# Patient Record
Sex: Male | Born: 1940
Health system: Southern US, Community
[De-identification: ages and names within clinical notes are randomized; demographics above are authoritative.]

## PROBLEM LIST (undated history)

## (undated) DIAGNOSIS — R569 Unspecified convulsions: Secondary | ICD-10-CM

## (undated) DIAGNOSIS — C801 Malignant (primary) neoplasm, unspecified: Secondary | ICD-10-CM

## (undated) DIAGNOSIS — E785 Hyperlipidemia, unspecified: Secondary | ICD-10-CM

## (undated) DIAGNOSIS — M1990 Unspecified osteoarthritis, unspecified site: Secondary | ICD-10-CM

## (undated) DIAGNOSIS — G610 Guillain-Barre syndrome: Secondary | ICD-10-CM

## (undated) DIAGNOSIS — I10 Essential (primary) hypertension: Secondary | ICD-10-CM

## (undated) DIAGNOSIS — K219 Gastro-esophageal reflux disease without esophagitis: Secondary | ICD-10-CM

## (undated) HISTORY — DX: Essential (primary) hypertension: I10

## (undated) HISTORY — DX: Unspecified osteoarthritis, unspecified site: M19.90

## (undated) HISTORY — DX: Hyperlipidemia, unspecified: E78.5

## (undated) HISTORY — DX: Guillain-Barre syndrome: G61.0

## (undated) HISTORY — PX: CATARACT EXTRACTION: SUR2

## (undated) HISTORY — DX: Malignant (primary) neoplasm, unspecified: C80.1

## (undated) HISTORY — DX: Gastro-esophageal reflux disease without esophagitis: K21.9

---
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
## 1988-02-26 HISTORY — PX: NOSE SURGERY: SHX723

## 1993-02-25 HISTORY — PX: HERNIA REPAIR: SHX51

## 1997-08-04 ENCOUNTER — Other Ambulatory Visit: Admission: RE | Admit: 1997-08-04 | Discharge: 1997-08-04 | Payer: Self-pay | Admitting: Urology

## 1998-02-25 DIAGNOSIS — G610 Guillain-Barre syndrome: Secondary | ICD-10-CM

## 1998-02-25 HISTORY — DX: Guillain-Barre syndrome: G61.0

## 1998-02-25 HISTORY — PX: PROSTATE SURGERY: SHX751

## 1998-02-26 ENCOUNTER — Emergency Department (HOSPITAL_COMMUNITY): Admission: EM | Admit: 1998-02-26 | Discharge: 1998-02-26 | Payer: Self-pay | Admitting: Emergency Medicine

## 1998-02-27 ENCOUNTER — Encounter: Payer: Self-pay | Admitting: Neurology

## 1998-02-28 ENCOUNTER — Inpatient Hospital Stay (HOSPITAL_COMMUNITY): Admission: EM | Admit: 1998-02-28 | Discharge: 1998-03-20 | Payer: Self-pay | Admitting: Emergency Medicine

## 1998-02-28 ENCOUNTER — Encounter: Payer: Self-pay | Admitting: Neurology

## 1998-03-03 ENCOUNTER — Encounter: Payer: Self-pay | Admitting: Neurology

## 1998-03-05 ENCOUNTER — Encounter: Payer: Self-pay | Admitting: Neurology

## 1998-03-06 ENCOUNTER — Encounter: Payer: Self-pay | Admitting: Critical Care Medicine

## 1998-03-20 ENCOUNTER — Inpatient Hospital Stay
Admission: RE | Admit: 1998-03-20 | Discharge: 1998-03-30 | Payer: Self-pay | Admitting: Physical Medicine and Rehabilitation

## 1998-03-23 ENCOUNTER — Encounter: Payer: Self-pay | Admitting: Physical Medicine and Rehabilitation

## 1998-03-30 ENCOUNTER — Inpatient Hospital Stay (HOSPITAL_COMMUNITY)
Admission: RE | Admit: 1998-03-30 | Discharge: 1998-04-18 | Payer: Self-pay | Admitting: Physical Medicine and Rehabilitation

## 1998-04-19 ENCOUNTER — Encounter
Admission: RE | Admit: 1998-04-19 | Discharge: 1998-07-18 | Payer: Self-pay | Admitting: Physical Medicine and Rehabilitation

## 1998-07-27 ENCOUNTER — Encounter: Admission: RE | Admit: 1998-07-27 | Discharge: 1998-08-21 | Payer: Self-pay

## 1998-11-08 ENCOUNTER — Other Ambulatory Visit: Admission: RE | Admit: 1998-11-08 | Discharge: 1998-11-08 | Payer: Self-pay | Admitting: Urology

## 1998-12-14 ENCOUNTER — Encounter: Admission: RE | Admit: 1998-12-14 | Discharge: 1999-03-14 | Payer: Self-pay | Admitting: Radiation Oncology

## 1999-02-13 ENCOUNTER — Encounter: Payer: Self-pay | Admitting: Urology

## 1999-02-13 ENCOUNTER — Ambulatory Visit (HOSPITAL_BASED_OUTPATIENT_CLINIC_OR_DEPARTMENT_OTHER): Admission: RE | Admit: 1999-02-13 | Discharge: 1999-02-13 | Payer: Self-pay | Admitting: Urology

## 1999-03-16 ENCOUNTER — Encounter: Admission: RE | Admit: 1999-03-16 | Discharge: 1999-06-14 | Payer: Self-pay | Admitting: Radiation Oncology

## 2000-05-13 ENCOUNTER — Encounter: Payer: Self-pay | Admitting: Neurology

## 2000-05-13 ENCOUNTER — Ambulatory Visit (HOSPITAL_COMMUNITY): Admission: RE | Admit: 2000-05-13 | Discharge: 2000-05-13 | Payer: Self-pay | Admitting: Neurology

## 2002-02-16 ENCOUNTER — Encounter (HOSPITAL_BASED_OUTPATIENT_CLINIC_OR_DEPARTMENT_OTHER): Payer: Self-pay | Admitting: General Surgery

## 2002-02-22 ENCOUNTER — Ambulatory Visit (HOSPITAL_COMMUNITY): Admission: RE | Admit: 2002-02-22 | Discharge: 2002-02-22 | Payer: Self-pay | Admitting: General Surgery

## 2002-12-20 ENCOUNTER — Encounter: Payer: Self-pay | Admitting: Internal Medicine

## 2002-12-20 ENCOUNTER — Encounter: Admission: RE | Admit: 2002-12-20 | Discharge: 2002-12-20 | Payer: Self-pay | Admitting: Internal Medicine

## 2004-01-25 ENCOUNTER — Ambulatory Visit: Payer: Self-pay | Admitting: Cardiology

## 2004-01-30 ENCOUNTER — Ambulatory Visit: Payer: Self-pay | Admitting: Internal Medicine

## 2004-02-06 ENCOUNTER — Ambulatory Visit: Payer: Self-pay | Admitting: Internal Medicine

## 2004-09-10 ENCOUNTER — Ambulatory Visit: Payer: Self-pay | Admitting: Internal Medicine

## 2006-02-25 HISTORY — PX: SHOULDER SURGERY: SHX246

## 2006-12-22 ENCOUNTER — Encounter: Payer: Self-pay | Admitting: Internal Medicine

## 2007-10-28 ENCOUNTER — Ambulatory Visit: Payer: Self-pay | Admitting: Internal Medicine

## 2007-10-28 DIAGNOSIS — R42 Dizziness and giddiness: Secondary | ICD-10-CM | POA: Insufficient documentation

## 2007-10-28 DIAGNOSIS — J45909 Unspecified asthma, uncomplicated: Secondary | ICD-10-CM | POA: Insufficient documentation

## 2007-10-28 DIAGNOSIS — R7309 Other abnormal glucose: Secondary | ICD-10-CM | POA: Insufficient documentation

## 2007-10-28 DIAGNOSIS — K219 Gastro-esophageal reflux disease without esophagitis: Secondary | ICD-10-CM | POA: Insufficient documentation

## 2007-10-28 DIAGNOSIS — Z8546 Personal history of malignant neoplasm of prostate: Secondary | ICD-10-CM | POA: Insufficient documentation

## 2007-10-28 DIAGNOSIS — R059 Cough, unspecified: Secondary | ICD-10-CM | POA: Insufficient documentation

## 2007-10-28 DIAGNOSIS — R05 Cough: Secondary | ICD-10-CM

## 2007-11-03 ENCOUNTER — Telehealth (INDEPENDENT_AMBULATORY_CARE_PROVIDER_SITE_OTHER): Payer: Self-pay | Admitting: *Deleted

## 2007-11-03 ENCOUNTER — Encounter (INDEPENDENT_AMBULATORY_CARE_PROVIDER_SITE_OTHER): Payer: Self-pay | Admitting: *Deleted

## 2007-11-06 ENCOUNTER — Ambulatory Visit: Payer: Self-pay | Admitting: Internal Medicine

## 2007-11-06 LAB — CONVERTED CEMR LAB
OCCULT 1: NEGATIVE
OCCULT 2: NEGATIVE
OCCULT 3: NEGATIVE

## 2007-11-09 ENCOUNTER — Encounter (INDEPENDENT_AMBULATORY_CARE_PROVIDER_SITE_OTHER): Payer: Self-pay | Admitting: *Deleted

## 2007-12-07 ENCOUNTER — Telehealth (INDEPENDENT_AMBULATORY_CARE_PROVIDER_SITE_OTHER): Payer: Self-pay | Admitting: *Deleted

## 2008-01-05 ENCOUNTER — Telehealth (INDEPENDENT_AMBULATORY_CARE_PROVIDER_SITE_OTHER): Payer: Self-pay | Admitting: *Deleted

## 2008-01-05 DIAGNOSIS — F528 Other sexual dysfunction not due to a substance or known physiological condition: Secondary | ICD-10-CM | POA: Insufficient documentation

## 2008-03-02 ENCOUNTER — Telehealth (INDEPENDENT_AMBULATORY_CARE_PROVIDER_SITE_OTHER): Payer: Self-pay | Admitting: *Deleted

## 2008-06-03 ENCOUNTER — Telehealth (INDEPENDENT_AMBULATORY_CARE_PROVIDER_SITE_OTHER): Payer: Self-pay | Admitting: *Deleted

## 2010-03-25 LAB — CONVERTED CEMR LAB
ALT: 14 units/L (ref 0–53)
AST: 19 units/L (ref 0–37)
Albumin: 3.7 g/dL (ref 3.5–5.2)
Alkaline Phosphatase: 58 units/L (ref 39–117)
BUN: 17 mg/dL (ref 6–23)
Basophils Absolute: 0 10*3/uL (ref 0.0–0.1)
Basophils Relative: 0.7 % (ref 0.0–3.0)
Bilirubin, Direct: 0.1 mg/dL (ref 0.0–0.3)
CO2: 31 meq/L (ref 19–32)
Calcium: 9.2 mg/dL (ref 8.4–10.5)
Chloride: 107 meq/L (ref 96–112)
Cholesterol: 169 mg/dL (ref 0–200)
Creatinine, Ser: 0.8 mg/dL (ref 0.4–1.5)
Eosinophils Absolute: 0.1 10*3/uL (ref 0.0–0.7)
Eosinophils Relative: 2.1 % (ref 0.0–5.0)
GFR calc Af Amer: 124 mL/min
GFR calc non Af Amer: 102 mL/min
Glucose, Bld: 98 mg/dL (ref 70–99)
HCT: 45.2 % (ref 39.0–52.0)
HDL: 34.7 mg/dL — ABNORMAL LOW (ref >39.0)
Hemoglobin: 15.6 g/dL (ref 13.0–17.0)
Hgb A1c MFr Bld: 5.4 % (ref 4.6–6.0)
LDL Cholesterol: 122 mg/dL — ABNORMAL HIGH (ref 0–99)
Lymphocytes Relative: 23 % (ref 12.0–46.0)
MCHC: 34.6 g/dL (ref 30.0–36.0)
MCV: 96.7 fL (ref 78.0–100.0)
Monocytes Absolute: 0.7 10*3/uL (ref 0.1–1.0)
Monocytes Relative: 11.5 % (ref 3.0–12.0)
Neutro Abs: 3.8 10*3/uL (ref 1.4–7.7)
Neutrophils Relative %: 62.7 % (ref 43.0–77.0)
Platelets: 217 10*3/uL (ref 150–400)
Potassium: 4.6 meq/L (ref 3.5–5.1)
RBC: 4.67 M/uL (ref 4.22–5.81)
RDW: 12.5 % (ref 11.5–14.6)
Sodium: 140 meq/L (ref 135–145)
TSH: 2.94 microintl units/mL (ref 0.35–5.50)
Total Bilirubin: 0.9 mg/dL (ref 0.3–1.2)
Total CHOL/HDL Ratio: 4.9
Total Protein: 6.9 g/dL (ref 6.0–8.3)
Triglycerides: 64 mg/dL (ref 0–149)
VLDL: 13 mg/dL (ref 0–40)
WBC: 6 10*3/uL (ref 4.5–10.5)

## 2010-07-13 NOTE — Op Note (Signed)
NAME:  Drew Smith, Drew Smith                      ACCOUNT NO.:  [id number]   MEDICAL RECORD NO.:  [id number]                   PATIENT TYPE:  OIB   LOCATION:  NA                                   FACILITY:  [facility]   PHYSICIAN:  Leonie Man, M.D.                DATE OF BIRTH:  [birth year]   DATE OF PROCEDURE:  02/22/2002  DATE OF DISCHARGE:                                 OPERATIVE REPORT   PREOPERATIVE DIAGNOSIS:  Right inguinal hernia.   POSTOPERATIVE DIAGNOSIS:  Right direct inguinal hernia.   OPERATION PERFORMED:  Repair of right direct inguinal hernia with mesh.   SURGEON:  Leonie Man, M.D.   ASSISTANT:  Magnus Ivan, RN   ANESTHESIA:  General.   INDICATIONS FOR PROCEDURE:  The patient is a 70 year old man presenting with  a right-sided groin mass which has been easily reducible but increasing in  size causing a modest amount of discomfort.  He comes to the operating room  now after the risks and benefits of surgery had been fully discussed and all  questions answered and consent obtained for right inguinal hernia.   DESCRIPTION OF PROCEDURE:  Following the induction of satisfactory general  anesthesia, the patient was positioned supinely and the abdomen was prepped  and draped to be included in the sterile operative field.  A transverse  incision was made in the lower abdominal crease, deepened through the skin  and subcutaneous tissues carrying dissection down to the external oblique  aponeurosis.  The external inguinal ring was opened and the cord was  elevated and held with the Penrose drain.  A direct hernia was dissected  free from the cord and reduced into the retroperitoneum.  Dissecting along  the anterior medial aspects of the cord, there was no indirect hernia noted.  The floor of the inguinal canal was then repaired with a patch of  polypropylene mesh sewn in at the pubic tubercle and carried up along the  conjoined tendon to the internal ring and again  from the pubic tubercle up  along the shelving edge of Poupart's ligament to the internal ring.  The  mesh having been split so as to allow normal emanation of the cord.  The  tails of the mesh were then sutured down behind the cord to completely  reform a new internal ring.  This was sutured down with 2-0 Novofil sutures.  Sponge, instrument and sharp counts were then verified.  The external  oblique aponeurosis was closed over the cord with a running 2-0 Vicryl  suture.  Scarpa's fascia was closed with a running 3-0 Vicryl suture and the  skin was closed with running 4-0 Monocryl and then reinforced with Steri-  Strips.  Sterile dressings were applied.  Anesthetic was reversed and the  patient removed from the operating room to the recovery room in stable  condition, having tolerated the procedure well.  Leonie Man, M.D.    PB/MEDQ  D:  02/22/2002  T:  02/22/2002  Job:  [id number]

## 2010-11-28 ENCOUNTER — Encounter: Payer: Self-pay | Admitting: Family Medicine

## 2010-11-28 ENCOUNTER — Ambulatory Visit (INDEPENDENT_AMBULATORY_CARE_PROVIDER_SITE_OTHER): Payer: Medicare Other | Admitting: Family Medicine

## 2010-11-28 DIAGNOSIS — I10 Essential (primary) hypertension: Secondary | ICD-10-CM

## 2010-11-28 DIAGNOSIS — E785 Hyperlipidemia, unspecified: Secondary | ICD-10-CM

## 2010-11-28 DIAGNOSIS — Z8546 Personal history of malignant neoplasm of prostate: Secondary | ICD-10-CM

## 2010-11-28 DIAGNOSIS — Z23 Encounter for immunization: Secondary | ICD-10-CM

## 2010-11-28 DIAGNOSIS — B001 Herpesviral vesicular dermatitis: Secondary | ICD-10-CM

## 2010-11-28 DIAGNOSIS — B009 Herpesviral infection, unspecified: Secondary | ICD-10-CM

## 2010-11-28 DIAGNOSIS — K219 Gastro-esophageal reflux disease without esophagitis: Secondary | ICD-10-CM

## 2010-11-28 LAB — HEPATIC FUNCTION PANEL
Albumin: 3.8 g/dL (ref 3.5–5.2)
Alkaline Phosphatase: 62 U/L (ref 39–117)
Total Protein: 6.6 g/dL (ref 6.0–8.3)

## 2010-11-28 LAB — LIPID PANEL
Cholesterol: 159 mg/dL (ref 0–200)
HDL: 40.3 mg/dL (ref >39.00)
LDL Cholesterol: 103 mg/dL — ABNORMAL HIGH (ref 0–99)
Triglycerides: 81 mg/dL (ref 0.0–149.0)
VLDL: 16.2 mg/dL (ref 0.0–40.0)

## 2010-11-28 LAB — BASIC METABOLIC PANEL
Chloride: 105 mEq/L (ref 96–112)
Creatinine, Ser: 0.8 mg/dL (ref 0.4–1.5)
Potassium: 4.3 mEq/L (ref 3.5–5.1)
Sodium: 140 mEq/L (ref 135–145)

## 2010-11-28 MED ORDER — DESONIDE 0.05 % EX CREA: TOPICAL_CREAM | CUTANEOUS | Status: DC | PRN

## 2010-11-28 MED ORDER — AMLODIPINE BESYLATE 5 MG PO TABS: 5.0000 mg | ORAL_TABLET | Freq: Every day | ORAL | Status: DC

## 2010-11-28 MED ORDER — ACYCLOVIR 400 MG PO TABS: 400.0000 mg | ORAL_TABLET | ORAL | Status: DC | PRN

## 2010-11-28 NOTE — Progress Notes (Signed)
  Subjective:    Patient ID: Drew Smith, male    DOB: April 22, 1940, 70 y.o.   MRN: [id number]  HPI New patient to establish care. He has history of GERD, asthma, prostate cancer, hypertension, mild dyslipidemia. Prior history of Guillain Barre Syndrome.  Thus, cannot take flu vaccines. Osteoarthritis and takes Celebrex occasionally. He has history of recurrent cold sores and takes acyclovir. Hypertension treated with Norvasc 5 mg daily. Blood pressure well-controlled. No orthostasis.  Continues to see urologist yearly regarding his history of prostate cancer.  Treated with radiation therapy.  Getting PSAs through their office. Requesting lab work today. No history of colon cancer screening other than Hemoccults. He refuses colonoscopy. No history of Pneumovax.  Family history significant for brother with type 2 diabetes and stroke. Father had history of alcoholism.  Patient is divorced. He is retired from Air[employer]. Nonsmoker. Occasional alcohol use. Former Publishing rights manager.   Review of Systems  Constitutional: Negative for fever, chills, fatigue and unexpected weight change.  HENT: Negative for trouble swallowing.   Respiratory: Negative for cough, shortness of breath and wheezing.   Gastrointestinal: Negative for abdominal pain.  Genitourinary: Negative for dysuria and hematuria.  Musculoskeletal: Negative for back pain.  Skin: Negative for rash.  Neurological: Negative for dizziness and headaches.  Hematological: Negative for adenopathy.  Psychiatric/Behavioral: Negative for dysphoric mood.       Objective:   Physical Exam  Constitutional: He is oriented to person, place, and time. He appears well-developed and well-nourished.  HENT:  Right Ear: External ear normal.  Left Ear: External ear normal.  Mouth/Throat: Oropharynx is clear and moist.  Neck: Neck supple. No thyromegaly present.  Cardiovascular: Normal rate, regular rhythm and normal heart sounds.   No murmur  heard. Pulmonary/Chest: Effort normal and breath sounds normal. No respiratory distress. He has no wheezes. He has no rales.  Abdominal: Soft. He exhibits no distension and no mass. There is no tenderness. There is no rebound and no guarding.  Musculoskeletal: He exhibits no edema.  Lymphadenopathy:    He has no cervical adenopathy.  Neurological: He is alert and oriented to person, place, and time. No cranial nerve deficit.  Skin: No rash noted.  Psychiatric: He has a normal mood and affect. His behavior is normal.          Assessment & Plan:  #1 dyslipidemia. Recheck lipid and hepatic panel #2 hypertension stable continue current medication. Refilled. Check basic metabolic panel #3 GERD stable continue current medication  #4 history prostate cancer #5 history of Guillain-Barr syndrome. No flu vaccine. #6 history of recurrent cold sores. Refill acyclovir

## 2010-11-29 NOTE — Progress Notes (Signed)
Quick Note:  Pt informed on home VM ______ 

## 2010-12-19 ENCOUNTER — Other Ambulatory Visit (INDEPENDENT_AMBULATORY_CARE_PROVIDER_SITE_OTHER): Payer: Medicare Other | Admitting: Family Medicine

## 2010-12-19 DIAGNOSIS — K921 Melena: Secondary | ICD-10-CM

## 2010-12-19 DIAGNOSIS — IMO0001 Reserved for inherently not codable concepts without codable children: Secondary | ICD-10-CM

## 2010-12-19 LAB — HEMOCCULT GUIAC POC 1CARD (OFFICE): Card #3 Fecal Occult Blood, POC: NEGATIVE

## 2011-03-07 ENCOUNTER — Encounter: Payer: Self-pay | Admitting: Family Medicine

## 2011-03-07 ENCOUNTER — Ambulatory Visit (INDEPENDENT_AMBULATORY_CARE_PROVIDER_SITE_OTHER): Payer: Medicare Other | Admitting: Family Medicine

## 2011-03-07 VITALS — BP 150/82 | Temp 98.3°F | Wt 176.0 lb

## 2011-03-07 DIAGNOSIS — R200 Anesthesia of skin: Secondary | ICD-10-CM

## 2011-03-07 DIAGNOSIS — R209 Unspecified disturbances of skin sensation: Secondary | ICD-10-CM

## 2011-03-07 DIAGNOSIS — Z8669 Personal history of other diseases of the nervous system and sense organs: Secondary | ICD-10-CM

## 2011-03-07 DIAGNOSIS — L820 Inflamed seborrheic keratosis: Secondary | ICD-10-CM

## 2011-03-07 NOTE — Patient Instructions (Signed)
Follow up promptly for any lower extremity weakness or any progressive numbness.

## 2011-03-07 NOTE — Progress Notes (Signed)
  Subjective:    Patient ID: Drew Smith, male    DOB: [birth year], 71 y.o.   MRN: [id number]  HPI  Here for separate issues as follows  Scaly brown lesion anterior nose. No history of skin cancer. Requesting treatment if possible. Previously prescribed Efudex for presumably actinic keratoses but no scaly lesions other than nasal lesion this time.  Intermittent numbness left foot. Occurs mostly with sitting. No weakness. Denies low back pain. No urine incontinence. Remote history of Guillain-Barr syndrome.   Review of Systems  Respiratory: Negative for cough and shortness of breath.   Cardiovascular: Negative for chest pain.  Gastrointestinal: Negative for abdominal pain.  Musculoskeletal: Negative for back pain.  Neurological: Positive for numbness. Negative for weakness.  Hematological: Negative for adenopathy. Does not bruise/bleed easily.       Objective:   Physical Exam  Constitutional: He is oriented to person, place, and time. He appears well-developed and well-nourished.  HENT:  Mouth/Throat: Oropharynx is clear and moist.  Neck: Neck supple. No thyromegaly present.  Cardiovascular: Normal rate and regular rhythm.   Pulmonary/Chest: Effort normal and breath sounds normal. No respiratory distress. He has no wheezes. He has no rales.  Musculoskeletal: He exhibits no edema.        good distal pulses. Good capillary refill  Lymphadenopathy:    He has no cervical adenopathy.  Neurological: He is alert and oriented to person, place, and time. He has normal reflexes.       Full-strength lower extremities.  Normal sensation at this time.  Skin:       Multiple seborrheic keratoses neck and head region.  Mid anterior nose reveals slightly raised, scaly, well demarcated, symmetric, brown lesion.          Assessment & Plan:  #1 seborrheic keratosis nose. Discussed risks and benefits of treatment with liquid nitrogen and patient wished to proceed. He was treated without  difficulty. No other concerning lesions noted #2 intermittent numbness left foot. Normal exam. No evidence for associated weakness. Probably positional. Touch base if numbness progresses or if he has any weakness or other new symptoms.

## 2011-04-15 ENCOUNTER — Telehealth: Payer: Self-pay | Admitting: *Deleted

## 2011-04-15 NOTE — Telephone Encounter (Signed)
Sent Fax to call us back with more information.

## 2011-04-15 NOTE — Telephone Encounter (Signed)
Pt does not answer phone.  Message left to call back.

## 2011-04-15 NOTE — Telephone Encounter (Signed)
Call pt and get more details

## 2011-04-15 NOTE — Telephone Encounter (Signed)
Pt has numbness of foot and history of Guliians Barre', so I would have to assume this is the problem.  ??

## 2011-04-15 NOTE — Telephone Encounter (Signed)
Needs order faxed for pt to have PT to evaluate and treat at appt at 3:30pm.

## 2011-04-15 NOTE — Telephone Encounter (Signed)
I do not see anything in the chart indication for physical therapy and don't know who was calling from where to ask questions?  Am I missing something? Fax order to [phone]

## 2011-04-16 ENCOUNTER — Telehealth: Payer: Self-pay | Admitting: *Deleted

## 2011-04-16 NOTE — Telephone Encounter (Signed)
SEE PREVIOUS PHONE NOTE, PLEASE.

## 2011-04-16 NOTE — Telephone Encounter (Signed)
Ibuprofen 800 one po q 8 hours prn and take with food.   Disp #60 with one refill.  Would use sparingly secondary to risk GI issues.

## 2011-04-16 NOTE — Telephone Encounter (Signed)
Pt requesting Ibuprofen 800 mg for back pain.  He has had it before with his previous doctor and likes to have it for long trips, which he has coming up [employer]

## 2011-04-17 MED ORDER — IBUPROFEN 600 MG PO TABS: 800.0000 mg | ORAL_TABLET | Freq: Three times a day (TID) | ORAL | Status: DC | PRN

## 2011-04-17 MED ORDER — IBUPROFEN 600 MG PO TABS: ORAL_TABLET | ORAL | Status: DC

## 2011-04-17 NOTE — Telephone Encounter (Signed)
Notified pt. 

## 2011-04-17 NOTE — Telephone Encounter (Signed)
Never received anything back from [facility].

## 2011-04-19 ENCOUNTER — Other Ambulatory Visit: Payer: Self-pay | Admitting: *Deleted

## 2011-04-19 MED ORDER — OMEPRAZOLE 20 MG PO CPDR: 20.0000 mg | DELAYED_RELEASE_CAPSULE | Freq: Every day | ORAL | Status: DC

## 2011-04-23 ENCOUNTER — Ambulatory Visit: Payer: Self-pay

## 2011-04-24 ENCOUNTER — Ambulatory Visit: Payer: Self-pay | Attending: Family Medicine

## 2011-07-29 ENCOUNTER — Encounter: Payer: Self-pay | Admitting: Family Medicine

## 2011-07-29 ENCOUNTER — Ambulatory Visit (INDEPENDENT_AMBULATORY_CARE_PROVIDER_SITE_OTHER): Payer: Medicare Other | Admitting: Family Medicine

## 2011-07-29 VITALS — BP 142/78 | Temp 98.0°F | Wt 175.0 lb

## 2011-07-29 DIAGNOSIS — L820 Inflamed seborrheic keratosis: Secondary | ICD-10-CM

## 2011-07-29 DIAGNOSIS — I10 Essential (primary) hypertension: Secondary | ICD-10-CM

## 2011-07-29 NOTE — Patient Instructions (Signed)
Seborrheic Keratosis  Seborrheic keratosis is a common, noncancerous (benign) skin growth that can occur anywhere on the skin. It looks like "stuck-on," waxy, rough, tan, brown, or black spots on the skin. These skin growths can be flat or raised. They are often called "barnacles" because of their pasted-on appearance. Usually, these skin growths appear in adulthood, around age 72, and increase in number as you age. They may also develop during pregnancy or following estrogen therapy. Many people may only have one growth appear in their lifetime, while some people may develop many growths.  CAUSES  It is unknown what causes these skin growths, but they appear to run in families.  SYMPTOMS  Seborrheic keratosis is often located on the face, chest, shoulders, back, or other areas. These growths are:   Usually painless, but may become irritated and itchy.   Yellow, brown, black, or other colors.   Slightly raised or have a flat surface.   Sometimes rough or wart-like in texture.   Often waxy on the surface.   Round or oval-shaped.   Sometimes "stuck-on" in appearance.   Sometimes single, but there are usually many growths.  Any growth that bleeds, itches on a regular basis, becomes inflamed, or becomes irritated needs to be evaluated by a skin specialist (dermatologist).  DIAGNOSIS  Diagnosis is mainly based on the way the growths appear. In some cases, it can be difficult to tell this type of skin growth from skin cancer. A skin growth tissue sample (biopsy)  may be used to confirm the diagnosis.  TREATMENT  Most often, treatment is not needed because the skin growths are benign. If the skin growth is irritated easily by clothing or jewelry, causing it to scab or bleed, treatment may be recommended. Patients may also choose to have the growths removed because they do not like their appearance. Most commonly, these growths are treated with cryosurgery.   In cryosurgery, liquid nitrogen is applied to "freeze" the growth. The growth usually falls off within a matter of days. A blister may form and dry into a scab that will also fall off. After the growth or scab falls off, it may leave a dark or light spot on the skin. This color may fade over time, or it may remain permanent on the skin.  HOME CARE INSTRUCTIONS  If the skin growths are treated with cryosurgery, the treated area needs to be kept clean with water and soap.  SEEK MEDICAL CARE IF:   You have questions about these growths or other skin problems.   You develop new symptoms, including:   A change in the appearance of the skin growth.   New growths.   Any bleeding, itching, or pain in the growths.   A skin growth that looks similar to seborrheic keratosis.  Document Released: 03/16/2010 Document Revised: 01/31/2011 Document Reviewed: 03/16/2010  [employer] Patient Information 2012 [employer].

## 2011-07-29 NOTE — Progress Notes (Signed)
  Subjective:    Patient ID: Drew Smith, male    DOB: [birth year], 71 y.o.   MRN: [id number]  HPI  Patient seen with the following issues  He's noticed some dark-colored and" bumps" anterior chest and right posterior neck. Sometimes irritated with jewelry. Occasional itching. No bleeding. No personal history of skin cancer. No recent rapid growth.  Occasional dizziness with lightheadedness but no syncope while playing golf. Generally tries to drink plenty of fluids. Some of this is position related. Takes amlodipine 5 mg daily for blood pressure. No chest pains. Blood pressure consistently 130/70 by home readings.   Review of Systems  Constitutional: Negative for fatigue.  Eyes: Negative for visual disturbance.  Respiratory: Negative for cough, chest tightness and shortness of breath.   Cardiovascular: Negative for chest pain, palpitations and leg swelling.  Neurological: Negative for dizziness, syncope, weakness, light-headedness and headaches.       Objective:   Physical Exam  Constitutional: He appears well-developed and well-nourished.  Cardiovascular: Normal rate and regular rhythm.   Pulmonary/Chest: Effort normal and breath sounds normal. No respiratory distress. He has no wheezes. He has no rales.  Musculoskeletal: He exhibits no edema.  Skin:       Patient has some scattered slightly raised well-demarcated brownish lesions consistent with seborrheic keratoses including posterior to left ear and anterior upper chest wall. Similar lesion right posterior neck. No atypical pigmentary change.          Assessment & Plan:  #1 irritated seborrheic keratoses. Patient requesting treatment. Discussed risk and benefits of treatment with liquid nitrogen and patient consented. Tolerated without difficulty.  Lesions treated included posterior to the left ear, right posterior neck, and 3 lesions upper anterior chest. #2 hypertension. Initial elevation today but repeat reading  improved. Continue close monitoring.

## 2011-08-02 ENCOUNTER — Other Ambulatory Visit: Payer: Self-pay | Admitting: *Deleted

## 2011-08-02 MED ORDER — TADALAFIL 5 MG PO TABS: 5.0000 mg | ORAL_TABLET | Freq: Every day | ORAL | Status: DC

## 2011-08-02 NOTE — Telephone Encounter (Signed)
Pt requesting trial new med Cialis 5 mg daily.  Per dr Caryl Never, [place] Cialis 5 mg daily with 5 refills.

## 2011-11-19 ENCOUNTER — Other Ambulatory Visit: Payer: Self-pay | Admitting: Family Medicine

## 2011-11-19 ENCOUNTER — Telehealth: Payer: Self-pay | Admitting: Family Medicine

## 2011-11-19 MED ORDER — OMEPRAZOLE 20 MG PO CPDR: 20.0000 mg | DELAYED_RELEASE_CAPSULE | Freq: Two times a day (BID) | ORAL | Status: DC

## 2011-11-19 NOTE — Telephone Encounter (Signed)
Patient called stating that his omeprazole should be twice per day and this is costing him more money getting the once per day and running out. Please advise and assist. Pharmacy is [facility]

## 2011-11-19 NOTE — Addendum Note (Signed)
Addended by: Melchor Amour on: 11/19/2011 12:27 PM   Modules accepted: Orders

## 2011-11-19 NOTE — Telephone Encounter (Signed)
Our records were once daily, changed to twice daily and sent to pharmacy

## 2011-12-04 ENCOUNTER — Other Ambulatory Visit: Payer: Self-pay | Admitting: *Deleted

## 2011-12-04 MED ORDER — DESONIDE 0.05 % EX CREA: TOPICAL_CREAM | CUTANEOUS | Status: DC | PRN

## 2012-01-27 ENCOUNTER — Encounter: Payer: Self-pay | Admitting: Family Medicine

## 2012-01-27 ENCOUNTER — Ambulatory Visit (INDEPENDENT_AMBULATORY_CARE_PROVIDER_SITE_OTHER): Payer: Medicare Other | Admitting: Family Medicine

## 2012-01-27 VITALS — BP 152/80 | Temp 97.8°F | Wt 174.0 lb

## 2012-01-27 DIAGNOSIS — K0520 Aggressive periodontitis, unspecified: Secondary | ICD-10-CM

## 2012-01-27 DIAGNOSIS — K05219 Aggressive periodontitis, localized, unspecified severity: Secondary | ICD-10-CM

## 2012-01-27 DIAGNOSIS — L509 Urticaria, unspecified: Secondary | ICD-10-CM

## 2012-01-27 DIAGNOSIS — I10 Essential (primary) hypertension: Secondary | ICD-10-CM

## 2012-01-27 MED ORDER — HYDROCHLOROTHIAZIDE 12.5 MG PO CAPS: 12.5000 mg | ORAL_CAPSULE | Freq: Every day | ORAL | Status: DC

## 2012-01-27 NOTE — Progress Notes (Signed)
  Subjective:    Patient ID: Drew Smith, male    DOB: 02/23/1941, 71 y.o.   MRN: [id number]  HPI  Patient seen for several issues as follows  History of hypertension. Takes amlodipine regularly 5 mg daily. Recently had some elevated blood pressure readings from 150 systolic. No headaches or dizziness. No recent chest pains.  Has noted frequent urticaria over the past month. These occur mostly on the upper extremities and trunk and are pressure induced. Has not tried any medications. He denies any history of food allergies.  He's had some recent some problems with right upper gum pain. He saw a dentist last week and was placed on amoxicillin for presumed dental abscess.. No recent fever or chills. He's also complains of sinus congestion and mild sore throat past few days. No purulent drainage.  Past Medical History  Diagnosis Date  . Arthritis   . Asthma   . Cancer     prostate  . GERD (gastroesophageal reflux disease)   . Hyperlipidemia   . Hypertension   . Guillain-Barre syndrome 2000   Past Surgical History  Procedure Date  . Eye surgery 3077    71 years old  . Nose surgery 1990  . Hernia repair 1995  . Shoulder surgery 2008  . Prostate surgery 2000    seed inplants    reports that he has never smoked. He does not have any smokeless tobacco history on file. His alcohol and drug histories not on file. family history includes Alcohol abuse in his father; Diabetes in his brother; Hyperlipidemia in his brother; and Stroke in his brother. No Known Allergies    Review of Systems  Constitutional: Negative for fever and chills.  Respiratory: Negative for cough and shortness of breath.   Cardiovascular: Negative for chest pain, palpitations and leg swelling.  Gastrointestinal: Negative for abdominal pain.  Skin: Positive for rash.  Neurological: Negative for dizziness and headaches.       Objective:   Physical Exam  Constitutional: He appears well-developed and  well-nourished.  HENT:  Right Ear: External ear normal.  Left Ear: External ear normal.  Mouth/Throat: Oropharynx is clear and moist.       Patient has significant dental decay. especially upper molars. He has some nonspecific erythema of the right upper gum. No evidence for any purulent drainage. No fluctuance.  Neck: Neck supple. No thyromegaly present.  Cardiovascular: Normal rate and regular rhythm.   Pulmonary/Chest: Effort normal and breath sounds normal. No respiratory distress. He has no wheezes. He has no rales.  Musculoskeletal: He exhibits no edema.  Lymphadenopathy:    He has no cervical adenopathy.  Skin:       Patient has only one isolated urticarial type lesion right lower abdomen.          Assessment & Plan:  #1 hypertension. Poorly controlled. Add HCTZ 12.5 mg daily. He has isolated systolic hypertension so we'll add this in addition to amlodipine. Reassess one month. Recommend high potassium diet #2 pressure induced urticaria. He'll try over-the-counter Allegra or Zyrtec for suppression since these are frequent  #3 probable recent right upper gum abscess. Finish out amoxicillin and keep followup with the dentist as scheduled.

## 2012-01-27 NOTE — Patient Instructions (Signed)
Try Allegra or Zyrtec for urticaria.

## 2012-01-28 ENCOUNTER — Other Ambulatory Visit: Payer: Self-pay | Admitting: *Deleted

## 2012-01-28 MED ORDER — HYDROCHLOROTHIAZIDE 12.5 MG PO CAPS: 12.5000 mg | ORAL_CAPSULE | Freq: Every day | ORAL | Status: DC

## 2012-02-21 ENCOUNTER — Ambulatory Visit (INDEPENDENT_AMBULATORY_CARE_PROVIDER_SITE_OTHER): Payer: Medicare Other | Admitting: Family Medicine

## 2012-02-21 ENCOUNTER — Other Ambulatory Visit: Payer: Self-pay | Admitting: *Deleted

## 2012-02-21 VITALS — BP 130/70 | HR 76 | Temp 98.0°F | Resp 16 | Ht 66.75 in | Wt 174.0 lb

## 2012-02-21 DIAGNOSIS — R609 Edema, unspecified: Secondary | ICD-10-CM

## 2012-02-21 DIAGNOSIS — L909 Atrophic disorder of skin, unspecified: Secondary | ICD-10-CM

## 2012-02-21 DIAGNOSIS — L918 Other hypertrophic disorders of the skin: Secondary | ICD-10-CM

## 2012-02-21 DIAGNOSIS — L919 Hypertrophic disorder of the skin, unspecified: Secondary | ICD-10-CM

## 2012-02-21 DIAGNOSIS — I10 Essential (primary) hypertension: Secondary | ICD-10-CM

## 2012-02-21 DIAGNOSIS — L509 Urticaria, unspecified: Secondary | ICD-10-CM

## 2012-02-21 DIAGNOSIS — F418 Other specified anxiety disorders: Secondary | ICD-10-CM

## 2012-02-21 DIAGNOSIS — F411 Generalized anxiety disorder: Secondary | ICD-10-CM

## 2012-02-21 DIAGNOSIS — R600 Localized edema: Secondary | ICD-10-CM

## 2012-02-21 MED ORDER — LORAZEPAM 0.5 MG PO TABS: ORAL_TABLET | ORAL | Status: DC

## 2012-02-21 NOTE — Patient Instructions (Addendum)
Monitor blood pressure and be in touch if consistently > 140/90 Continue with Zyrtec once daily for hives.

## 2012-02-21 NOTE — Progress Notes (Signed)
  Subjective:    Patient ID: Drew Smith, male    DOB: 06/17/1940, 71 y.o.   MRN: [id number]  HPI Patient seen for several issues  Recent poorly controlled blood pressure and added HCTZ 12.5 mg a day to amlodipine. He broke out some rash but is not sure if he had some rash before adding HCTZ. He describes what sounds like urticarial lesions on his trunk. These are starting to fade. He is using Zyrtec. Reposts some intermittent swelling of the feet. Has been on amlodipine for hypertension but stopped all medications about 3 days ago. His blood pressures have been stable since then. Still some itching involving the trunk.  Patient has a couple of skin tags left neck region which are irritating because of difficulty shaving. Requesting treatment today.  Upcoming dental surgery. Very anxious. Requesting premedication for anxiety.  Past Medical History  Diagnosis Date  . Arthritis   . Asthma   . Cancer     prostate  . GERD (gastroesophageal reflux disease)   . Hyperlipidemia   . Hypertension   . Guillain-Barre syndrome 2000   Past Surgical History  Procedure Date  . Eye surgery 6638    71 years old  . Nose surgery 1990  . Hernia repair 1995  . Shoulder surgery 2008  . Prostate surgery 2000    seed inplants    reports that he has never smoked. He does not have any smokeless tobacco history on file. His alcohol and drug histories not on file. family history includes Alcohol abuse in his father; Diabetes in his brother; Hyperlipidemia in his brother; and Stroke in his brother. No Known Allergies    Review of Systems  Constitutional: Negative for fever and chills.  HENT: Negative for sore throat.   Respiratory: Negative for cough and shortness of breath.   Cardiovascular: Negative for chest pain and leg swelling.  Skin: Positive for rash.  Neurological: Negative for headaches.  Hematological: Negative for adenopathy.       Objective:   Physical Exam  Constitutional: He  appears well-developed and well-nourished.  Cardiovascular: Normal rate and regular rhythm.   Pulmonary/Chest: Effort normal and breath sounds normal. No respiratory distress. He has no wheezes. He has no rales.  Musculoskeletal: He exhibits no edema.  Skin: Rash noted.       Patient is a couple blanching slightly raised urticarial type lesions on the trunk. This appeared to be fading.  Couple small benign appearing skin tags left chin region          Assessment & Plan:  #1 hypertension. Patient surprisingly has good blood pressure today 130/70 off medications past few days. Continue close monitoring. Has followup couple weeks to reassess #2 hives. Possibly related HCTZ. Continue off this medication and continue Zyrtec. These appear to be fading #3 reported recent bilateral foot edema. Resolved at this time. Possibly related to amlodipine #4 situational anxiety. Patient requesting medication for dental procedure. Lorazepam 0.5 mg one hour prior procedure #5 skin tags. Discussed risk and benefits of treatment with liquid nitrogen and patient consents. These were aggravating because of the location and causing difficulty shaving -2 small skin tags about 1 mm stalk were treated

## 2012-02-28 ENCOUNTER — Ambulatory Visit (INDEPENDENT_AMBULATORY_CARE_PROVIDER_SITE_OTHER): Payer: Medicare Other | Admitting: Family Medicine

## 2012-02-28 ENCOUNTER — Encounter: Payer: Self-pay | Admitting: Family Medicine

## 2012-02-28 VITALS — BP 144/70 | Temp 98.0°F | Wt 172.0 lb

## 2012-02-28 DIAGNOSIS — R609 Edema, unspecified: Secondary | ICD-10-CM

## 2012-02-28 DIAGNOSIS — L509 Urticaria, unspecified: Secondary | ICD-10-CM

## 2012-02-28 DIAGNOSIS — I10 Essential (primary) hypertension: Secondary | ICD-10-CM

## 2012-02-28 DIAGNOSIS — R600 Localized edema: Secondary | ICD-10-CM

## 2012-02-28 NOTE — Progress Notes (Signed)
  Subjective:    Patient ID: Drew Smith, male    DOB: March 20, 1940, 72 y.o.   MRN: [id number]  HPI Here with several items  History of hypertension. Currently not taking amlodipine or hydrochlorothiazide. Blood pressures mostly controlled by home readings. Occasional readings 140 systolic  He had some mild intermittent peripheral edema. Usually worse late day. No dyspnea. No orthopnea. Worse after prolonged periods of standing.  Intermittent pruritic rash which sounds like hives. Overall slightly improved compared to last visit. Taking Zyrtec 10 mg daily. No clear precipitants. No change of soaps or detergents. No obvious food allergies.  Past Medical History  Diagnosis Date  . Arthritis   . Asthma   . Cancer     prostate  . GERD (gastroesophageal reflux disease)   . Hyperlipidemia   . Hypertension   . Guillain-Barre syndrome 2000   Past Surgical History  Procedure Date  . Eye surgery 9969    72 years old  . Nose surgery 1990  . Hernia repair 1995  . Shoulder surgery 2008  . Prostate surgery 2000    seed inplants    reports that he has never smoked. He does not have any smokeless tobacco history on file. His alcohol and drug histories not on file. family history includes Alcohol abuse in his father; Diabetes in his brother; Hyperlipidemia in his brother; and Stroke in his brother. No Known Allergies    Review of Systems  Constitutional: Negative for fatigue.  Eyes: Negative for visual disturbance.  Respiratory: Negative for cough, chest tightness and shortness of breath.   Cardiovascular: Positive for leg swelling. Negative for chest pain and palpitations.  Skin: Positive for rash.  Neurological: Negative for dizziness, syncope, weakness, light-headedness and headaches.       Objective:   Physical Exam  Constitutional: He is oriented to person, place, and time. He appears well-developed and well-nourished.  Neck: Neck supple. No thyromegaly present.    Cardiovascular: Normal rate and regular rhythm.   Pulmonary/Chest: Effort normal and breath sounds normal. No respiratory distress. He has no wheezes. He has no rales.  Musculoskeletal: He exhibits no edema.  Neurological: He is alert and oriented to person, place, and time.          Assessment & Plan:  #1 hypertension. Currently stable off medications. Continue close monitoring #2 intermittent peripheral edema. Suspect venous stasis. No edema currently. Consider venous compression hose and prescription written #3 intermittent rash. These appear to be more allergy related. Continue antihistamines as needed #4 health maintenance. Patient had several questions regarding [facility] screening. We explained one time abdominal aneurysm screening may be reasonable at his age with prior history of hypertension and remote history of smoking

## 2012-03-12 ENCOUNTER — Other Ambulatory Visit: Payer: Self-pay | Admitting: Family Medicine

## 2012-04-22 ENCOUNTER — Other Ambulatory Visit (INDEPENDENT_AMBULATORY_CARE_PROVIDER_SITE_OTHER): Payer: Medicare Other

## 2012-04-22 DIAGNOSIS — E785 Hyperlipidemia, unspecified: Secondary | ICD-10-CM

## 2012-04-22 DIAGNOSIS — Z8546 Personal history of malignant neoplasm of prostate: Secondary | ICD-10-CM

## 2012-04-22 DIAGNOSIS — I10 Essential (primary) hypertension: Secondary | ICD-10-CM

## 2012-04-22 DIAGNOSIS — Z0000 Encounter for general adult medical examination without abnormal findings: Secondary | ICD-10-CM

## 2012-04-22 LAB — CBC WITH DIFFERENTIAL/PLATELET
Basophils Absolute: 0 10*3/uL (ref 0.0–0.1)
HCT: 43.8 % (ref 39.0–52.0)
Hemoglobin: 14.9 g/dL (ref 13.0–17.0)
Lymphs Abs: 2 10*3/uL (ref 0.7–4.0)
MCHC: 34 g/dL (ref 30.0–36.0)
MCV: 91.3 fl (ref 78.0–100.0)
Monocytes Absolute: 0.7 10*3/uL (ref 0.1–1.0)
Monocytes Relative: 10.5 % (ref 3.0–12.0)
Neutro Abs: 3.4 10*3/uL (ref 1.4–7.7)
Platelets: 225 10*3/uL (ref 150.0–400.0)
RDW: 13.1 % (ref 11.5–14.6)

## 2012-04-22 LAB — HEPATIC FUNCTION PANEL
ALT: 15 U/L (ref 0–53)
AST: 19 U/L (ref 0–37)
Albumin: 3.9 g/dL (ref 3.5–5.2)
Total Bilirubin: 0.8 mg/dL (ref 0.3–1.2)

## 2012-04-22 LAB — POCT URINALYSIS DIPSTICK
Blood, UA: NEGATIVE
Glucose, UA: NEGATIVE
Leukocytes, UA: NEGATIVE
Spec Grav, UA: 1.02
Urobilinogen, UA: 0.2

## 2012-04-22 LAB — TSH: TSH: 3.48 u[IU]/mL (ref 0.35–5.50)

## 2012-04-22 LAB — LIPID PANEL
Cholesterol: 156 mg/dL (ref 0–200)
HDL: 40.6 mg/dL (ref >39.00)
Triglycerides: 59 mg/dL (ref 0.0–149.0)

## 2012-04-22 LAB — BASIC METABOLIC PANEL
BUN: 18 mg/dL (ref 6–23)
CO2: 30 mEq/L (ref 19–32)
Chloride: 102 mEq/L (ref 96–112)
Glucose, Bld: 95 mg/dL (ref 70–99)
Potassium: 4.5 mEq/L (ref 3.5–5.1)
Sodium: 138 mEq/L (ref 135–145)

## 2012-04-22 LAB — PSA: PSA: 0.01 ng/mL — ABNORMAL LOW (ref 0.10–4.00)

## 2012-04-29 ENCOUNTER — Encounter: Payer: Self-pay | Admitting: Family Medicine

## 2012-04-29 ENCOUNTER — Ambulatory Visit (INDEPENDENT_AMBULATORY_CARE_PROVIDER_SITE_OTHER): Payer: Medicare Other | Admitting: Family Medicine

## 2012-04-29 VITALS — BP 140/88 | HR 72 | Temp 97.6°F | Resp 12 | Ht 67.5 in | Wt 171.0 lb

## 2012-04-29 DIAGNOSIS — Z0000 Encounter for general adult medical examination without abnormal findings: Secondary | ICD-10-CM

## 2012-04-29 DIAGNOSIS — L509 Urticaria, unspecified: Secondary | ICD-10-CM

## 2012-04-29 DIAGNOSIS — K219 Gastro-esophageal reflux disease without esophagitis: Secondary | ICD-10-CM

## 2012-04-29 DIAGNOSIS — I10 Essential (primary) hypertension: Secondary | ICD-10-CM

## 2012-04-29 DIAGNOSIS — L508 Other urticaria: Secondary | ICD-10-CM

## 2012-04-29 DIAGNOSIS — Z8546 Personal history of malignant neoplasm of prostate: Secondary | ICD-10-CM

## 2012-04-29 MED ORDER — AMLODIPINE BESYLATE 2.5 MG PO TABS: 2.5000 mg | ORAL_TABLET | Freq: Every day | ORAL | Status: DC

## 2012-04-29 MED ORDER — HYDROCHLOROTHIAZIDE 12.5 MG PO CAPS: 12.5000 mg | ORAL_CAPSULE | Freq: Every day | ORAL | Status: DC

## 2012-04-29 NOTE — Patient Instructions (Addendum)
Continue regular exercise habits. Complete Hemoccult cards Let me know if you reconsider colonoscopy screening Start on daily amlodipine 2.5 mg and HCTZ 12.5 mg

## 2012-04-29 NOTE — Progress Notes (Signed)
Subjective:    Patient ID: Drew Smith, male    DOB: [birth year], 72 y.o.   MRN: [id number]  HPI Patient here for medical followup and Medicare wellness exam.  He continues to have intermittent hives which appear to be pressure induced. Antihistamine with some success. No change of soaps or detergents. No correlation with foods.  Hypertension treated with amlodipine and HCTZ. However, he is only taking these every other day. Intermittent foot edema which he thought may have been related to amlodipine. Not checking blood pressures regularly. No regular nonsteroidal use.  GERD stable with Prilosec.  No dysphagia.  Weight stable.  Recent [facility] screening results reviewed. No concerns. History Guillain-Barr syndrome. He is reluctant to take tetanus or shingles vaccine. Does not get flu vaccines. Has refused prior colonoscopy. Does agree to Hemoccult.  Past Medical History  Diagnosis Date  . Arthritis   . Asthma   . Cancer     prostate  . GERD (gastroesophageal reflux disease)   . Hyperlipidemia   . Hypertension   . Guillain-Barre syndrome 2000   Past Surgical History  Procedure Laterality Date  . Eye surgery  3976    72 years old  . Nose surgery  1990  . Hernia repair  1995  . Shoulder surgery  2008  . Prostate surgery  2000    seed inplants    reports that he has never smoked. He does not have any smokeless tobacco history on file. His alcohol and drug histories are not on file. family history includes Alcohol abuse in his father; Diabetes in his brother; Hyperlipidemia in his brother; and Stroke in his brother. No Known Allergies  1.  Risk factors based on Past Medical , Social, and Family history reviewed and as above 2.  Limitations in physical activities low risk for fall. 3.  Depression/mood no depression or anxiety issues 4.  Hearing no deficits 5.  ADLs independent in all 6.  Cognitive function (orientation to time and place, language, writing,  speech,memory) no memory deficits. No language or judgment deficits 7.  Home Safety no issues 8.  Height, weight, and visual acuity. All stable 9.  Counseling discussed regular exercise. Discussed fall prevention. 10. Recommendation of preventive services. Hemoccults given. He's not interested in colonoscopy screening. 11. Labs based on risk factors labs are reviewed and no significant abnormalities  12. Care Plan as above    Review of Systems  Constitutional: Negative for fever, activity change, appetite change and fatigue.  HENT: Negative for ear pain, congestion and trouble swallowing.   Eyes: Negative for pain and visual disturbance.  Respiratory: Negative for cough, shortness of breath and wheezing.   Cardiovascular: Negative for chest pain and palpitations.  Gastrointestinal: Negative for nausea, vomiting, abdominal pain, diarrhea, constipation, blood in stool, abdominal distention and rectal pain.  Genitourinary: Negative for dysuria, hematuria and testicular pain.  Musculoskeletal: Negative for joint swelling and arthralgias.  Skin: Positive for rash.  Neurological: Negative for dizziness, syncope and headaches.  Hematological: Negative for adenopathy.  Psychiatric/Behavioral: Negative for confusion and dysphoric mood.       Objective:   Physical Exam  Constitutional: He is oriented to person, place, and time. He appears well-developed and well-nourished. No distress.  HENT:  Head: Normocephalic and atraumatic.  Right Ear: External ear normal.  Left Ear: External ear normal.  Mouth/Throat: Oropharynx is clear and moist.  Eyes: Conjunctivae and EOM are normal. Pupils are equal, round, and reactive to light.  Neck: Normal range of motion.  Neck supple. No thyromegaly present.  Cardiovascular: Normal rate, regular rhythm and normal heart sounds.   No murmur heard. Pulmonary/Chest: No respiratory distress. He has no wheezes. He has no rales.  Abdominal: Soft. Bowel sounds  are normal. He exhibits no distension and no mass. There is no tenderness. There is no rebound and no guarding.  Musculoskeletal: He exhibits edema.  Only trace edema lower legs bilaterally. No foot edema  Lymphadenopathy:    He has no cervical adenopathy.  Neurological: He is alert and oriented to person, place, and time. He displays normal reflexes. No cranial nerve deficit.  Skin: No rash noted.  Psychiatric: He has a normal mood and affect.          Assessment & Plan:  #1 health maintenance. We discussed vaccine such as tetanus but he cannot confirm whether his episode of Gullain-Barre was within 6 months of tetanus booster. Avoid shingles vaccine. Hemoccults given. He is undecided regarding colonoscopy #2 hypertension. We've recommended daily use of amlodipine 2.5 mg daily and continue HCTZ 12.5 mg daily. #3 intermittent hives-?pressure induced.  Continue antihistamine. No clear food triggers. #4 GERD stable.

## 2012-05-05 ENCOUNTER — Other Ambulatory Visit (INDEPENDENT_AMBULATORY_CARE_PROVIDER_SITE_OTHER): Payer: Medicare Other

## 2012-05-05 DIAGNOSIS — Z0000 Encounter for general adult medical examination without abnormal findings: Secondary | ICD-10-CM

## 2012-05-05 LAB — HEMOCCULT GUIAC POC 1CARD (OFFICE): Card #3 Fecal Occult Blood, POC: NEGATIVE

## 2012-05-05 NOTE — Progress Notes (Signed)
Quick Note:  Pt informed on home VM ______ 

## 2012-10-06 ENCOUNTER — Encounter: Payer: Self-pay | Admitting: Family Medicine

## 2012-10-06 ENCOUNTER — Ambulatory Visit (INDEPENDENT_AMBULATORY_CARE_PROVIDER_SITE_OTHER): Payer: Medicare Other | Admitting: Family Medicine

## 2012-10-06 VITALS — BP 138/78 | HR 60 | Temp 97.8°F | Wt 167.0 lb

## 2012-10-06 DIAGNOSIS — R600 Localized edema: Secondary | ICD-10-CM

## 2012-10-06 DIAGNOSIS — L509 Urticaria, unspecified: Secondary | ICD-10-CM

## 2012-10-06 DIAGNOSIS — R609 Edema, unspecified: Secondary | ICD-10-CM

## 2012-10-06 DIAGNOSIS — I10 Essential (primary) hypertension: Secondary | ICD-10-CM

## 2012-10-06 NOTE — Patient Instructions (Addendum)
Hives Hives are itchy, red, swollen areas of the skin. They can vary in size and location on your body. Hives can come and go for hours or several days (acute hives) or for several weeks (chronic hives). Hives do not spread from person to person (noncontagious). They may get worse with scratching, exercise, and emotional stress. CAUSES   Allergic reaction to food, additives, or drugs.  Infections, including the common cold.  Illness, such as vasculitis, lupus, or thyroid disease.  Exposure to sunlight, heat, or cold.  Exercise.  Stress.  Contact with chemicals. SYMPTOMS   Red or white swollen patches on the skin. The patches may change size, shape, and location quickly and repeatedly.  Itching.  Swelling of the hands, feet, and face. This may occur if hives develop deeper in the skin. DIAGNOSIS  Your caregiver can usually tell what is wrong by performing a physical exam. Skin or blood tests may also be done to determine the cause of your hives. In some cases, the cause cannot be determined. TREATMENT  Mild cases usually get better with medicines such as antihistamines. Severe cases may require an emergency epinephrine injection. If the cause of your hives is known, treatment includes avoiding that trigger.  HOME CARE INSTRUCTIONS   Avoid causes that trigger your hives.  Take antihistamines as directed by your caregiver to reduce the severity of your hives. Non-sedating or low-sedating antihistamines are usually recommended. Do not drive while taking an antihistamine.  Take any other medicines prescribed for itching as directed by your caregiver.  Wear loose-fitting clothing.  Keep all follow-up appointments as directed by your caregiver. SEEK MEDICAL CARE IF:   You have persistent or severe itching that is not relieved with medicine.  You have painful or swollen joints. SEEK IMMEDIATE MEDICAL CARE IF:   You have a fever.  Your tongue or lips are swollen.  You have  trouble breathing or swallowing.  You feel tightness in the throat or chest.  You have abdominal pain. These problems may be the first sign of a life-threatening allergic reaction. Call your local emergency services (911 in [place] MAKE SURE YOU:   Understand these instructions.  Will watch your condition.  Will get help right away if you are not doing well or get worse. Document Released: 02/11/2005 Document Revised: 08/13/2011 Document Reviewed: 05/07/2011 [facility] Patient Information 2014 [place], [place].  Continue with Zyrtec for hives and may add Pepcid or Zantac for hives.

## 2012-10-06 NOTE — Progress Notes (Signed)
Subjective:    Patient ID: Drew Smith, male    DOB: 20-May-1940, 72 y.o.   MRN: [id number]  HPI Patient has remote history of prostate cancer, hypertension, recurrent hives, history of Guillain-Barr syndrome, GERD, erectile dysfunction. He is currently not taking any regular prescription medications. He had been on amlodipine and HCTZ for hypertension but his blood pressure was well-controlled and he took himself off these medications. He is seen for the following issues today  Intermittent hives. Has been chronic for many years. He thinks these may be induced by local pressure. These do not seem to be associated with medications or foods. Usually controlled with topical steroids and Zyrtec. Currently has some upper inner thighs bilaterally  He has some chronic intermittent edema feet and legs. No dyspnea. Edema worse late in the day and improved with elevation. Currently not taking amlodipine. Recent lab work in February all normal with normal albumin, normal renal function, and normal TSH. His weight is actually down 4 pounds from last visit which he attributes to increased walking and activity. Generally feels well  Past Medical History  Diagnosis Date  . Arthritis   . Asthma   . Cancer     prostate  . GERD (gastroesophageal reflux disease)   . Hyperlipidemia   . Hypertension   . Guillain-Barre syndrome 2000   Past Surgical History  Procedure Laterality Date  . Eye surgery  266    72 years old  . Nose surgery  1990  . Hernia repair  1995  . Shoulder surgery  2008  . Prostate surgery  2000    seed inplants    reports that he has never smoked. He does not have any smokeless tobacco history on file. His alcohol and drug histories are not on file. family history includes Alcohol abuse in his father; Diabetes in his brother; Hyperlipidemia in his brother; and Stroke in his brother. No Known Allergies    Review of Systems  Constitutional: Negative for fatigue and unexpected  weight change.  Eyes: Negative for visual disturbance.  Respiratory: Negative for cough, chest tightness and shortness of breath.   Cardiovascular: Positive for leg swelling. Negative for chest pain and palpitations.  Endocrine: Negative for polydipsia and polyuria.  Skin: Positive for rash.  Neurological: Negative for dizziness, syncope, weakness, light-headedness and headaches.  Hematological: Does not bruise/bleed easily.       Objective:   Physical Exam  Constitutional: He is oriented to person, place, and time. He appears well-developed and well-nourished.  Neck: Neck supple. No thyromegaly present.  Cardiovascular: Normal rate and regular rhythm.  Exam reveals no gallop.   Pulmonary/Chest: Effort normal and breath sounds normal. No respiratory distress. He has no wheezes. He has no rales.  Musculoskeletal: He exhibits no edema.  No edema noted today. He has good distal foot pulses  Lymphadenopathy:    He has no cervical adenopathy.  Neurological: He is alert and oriented to person, place, and time.  Skin: Rash noted.  Patient is a couple of urticarial lesions upper anterior thighs bilaterally no other skin rash noted  Psychiatric: He has a normal mood and affect. His behavior is normal.          Assessment & Plan:  #1 peripheral edema. Very mild and suspect venous stasis. Otherwise nonfocal exam. Reassurance. Recommend elevation. Symptoms are mild and is not interested in compression. Recent workup as above negative #2 recurrent hives. Question pressure induced. Continue Zyrtec. Consider addition of Pepcid or Zantac as needed.  We discussed possible referral to allergist and at this point he wishes to wait #3 history of hypertension. Currently stable off medication. Continue close monitoring

## 2012-12-11 ENCOUNTER — Other Ambulatory Visit: Payer: Self-pay | Admitting: Family Medicine

## 2013-01-10 ENCOUNTER — Other Ambulatory Visit: Payer: Self-pay | Admitting: Family Medicine

## 2013-01-20 ENCOUNTER — Ambulatory Visit (INDEPENDENT_AMBULATORY_CARE_PROVIDER_SITE_OTHER): Payer: Medicare Other | Admitting: Family Medicine

## 2013-01-20 ENCOUNTER — Ambulatory Visit (INDEPENDENT_AMBULATORY_CARE_PROVIDER_SITE_OTHER)
Admission: RE | Admit: 2013-01-20 | Discharge: 2013-01-20 | Disposition: A | Discharge status: Home / Self Care | Payer: Medicare Other | Source: Ambulatory Visit | Attending: Family Medicine | Admitting: Family Medicine

## 2013-01-20 ENCOUNTER — Encounter: Payer: Self-pay | Admitting: Family Medicine

## 2013-01-20 VITALS — BP 130/64 | HR 64 | Temp 97.7°F | Wt 170.0 lb

## 2013-01-20 DIAGNOSIS — R05 Cough: Secondary | ICD-10-CM

## 2013-01-20 DIAGNOSIS — L819 Disorder of pigmentation, unspecified: Secondary | ICD-10-CM

## 2013-01-20 DIAGNOSIS — R053 Chronic cough: Secondary | ICD-10-CM

## 2013-01-20 DIAGNOSIS — R059 Cough, unspecified: Secondary | ICD-10-CM

## 2013-01-20 NOTE — Progress Notes (Signed)
Pre visit review using our clinic review tool, if applicable. No additional management support is needed unless otherwise documented below in the visit note. 

## 2013-01-20 NOTE — Patient Instructions (Signed)
We will call you with pulmonary function tests. Go head and get CXR. Will call you with dermatology appointment.

## 2013-01-20 NOTE — Progress Notes (Signed)
   Subjective:    Patient ID: Drew Smith, male    DOB: 09-09-1940, 72 y.o.   MRN: [id number]  HPI Patient seen multiple following issues  Chronic cough. He states for several years. Never smoked. He did work well for much of his childhood around a lot of dust. Also both parents were heavy smokers. His cough is mostly dry and generally worse in the day. Has history of GERD but takes omeprazole regularly with no active GERD symptoms. May have some occasional postnasal drip symptoms but even when taking nasal steroids and antihistamines cough did not improve. He is not aware of any obvious wheezing. He's tried some type of inhaler previously which has not helped much. Denies any dyspnea. No chest pains. No hemoptysis. No recent appetite or weight changes. No recent chest x-ray.  Denies any clear exacerbating or alleviating factors.  Patient has pigmented lesion anterior scalp which he would like to have evaluated. No recent active changes. Increased sun exposure over several years. He was once prescribed Efudex by prior physician which did not help  Hypertension which is been stable with amlodipine and HCTZ.  Past Medical History  Diagnosis Date  . Arthritis   . Asthma   . Cancer     prostate  . GERD (gastroesophageal reflux disease)   . Hyperlipidemia   . Hypertension   . Guillain-Barre syndrome 2000   Past Surgical History  Procedure Laterality Date  . Eye surgery  6741    72 years old  . Nose surgery  1990  . Hernia repair  1995  . Shoulder surgery  2008  . Prostate surgery  2000    seed inplants    reports that he has never smoked. He does not have any smokeless tobacco history on file. His alcohol and drug histories are not on file. family history includes Alcohol abuse in his father; Diabetes in his brother; Hyperlipidemia in his brother; Stroke in his brother. No Known Allergies       Review of Systems  Constitutional: Negative for fever, chills, activity  change, appetite change, fatigue and unexpected weight change.  HENT: Positive for postnasal drip. Negative for congestion, ear pain, sore throat, trouble swallowing and voice change.   Respiratory: Positive for cough. Negative for shortness of breath, wheezing and stridor.   Cardiovascular: Negative for chest pain and leg swelling.  Gastrointestinal: Negative for abdominal pain.  Musculoskeletal: Negative for arthralgias.  Neurological: Negative for syncope and headaches.  Hematological: Negative for adenopathy.       Objective:   Physical Exam  Constitutional: He appears well-developed and well-nourished.  HENT:  Right Ear: External ear normal.  Left Ear: External ear normal.  Nose: Nose normal.  Mouth/Throat: Oropharynx is clear and moist.  Neck: Neck supple. No thyromegaly present.  Cardiovascular: Normal rate and regular rhythm.   Pulmonary/Chest: Effort normal and breath sounds normal. No respiratory distress. He has no wheezes. He has no rales.  Musculoskeletal: He exhibits no edema.  Skin:  Patient is a couple of macular areas of brownish slightly irregular pigmentation anterior scalp. Nonscaly          Assessment & Plan:  #1 chronic cough in a nonsmoker. Question postnasal drip related. He is already treated for GERD. No chronic sinusitis symptoms. No history of asthma. Obtain chest x-ray. Obtain pulmonary function tests. Consider pulmonary referral #2 pigmented lesion anterior scalp. Question seborrheic keratoses though this does not feel hyperkeratotic. Dermatology referral. #3 hypertension which is stable

## 2013-02-01 ENCOUNTER — Ambulatory Visit (INDEPENDENT_AMBULATORY_CARE_PROVIDER_SITE_OTHER): Payer: Medicare Other | Admitting: Internal Medicine

## 2013-02-01 DIAGNOSIS — R053 Chronic cough: Secondary | ICD-10-CM

## 2013-02-01 DIAGNOSIS — R05 Cough: Secondary | ICD-10-CM

## 2013-02-01 DIAGNOSIS — R059 Cough, unspecified: Secondary | ICD-10-CM

## 2013-02-01 LAB — PULMONARY FUNCTION TEST
DL/VA % pred: 122 %
DL/VA: 5.38 ml/min/mmHg/L
DLCO unc % pred: 84 %
DLCO unc: 24.02 ml/min/mmHg
FEF 25-75 Post: 2.75 L/sec
FEF 25-75 Pre: 2.48 L/sec
FEF2575-%Pred-Pre: 122 %
FEV1-%Pred-Pre: 84 %
FEV1-Post: 2.39 L
FEV6-%Change-Post: 0 %
FEV6-%Pred-Post: 79 %
FEV6-%Pred-Pre: 79 %
FEV6-Pre: 2.82 L
FEV6FVC-%Pred-Post: 103 %
FVC-%Change-Post: 2 %
FVC-%Pred-Post: 76 %
FVC-Post: 2.9 L
FVC-Pre: 2.83 L
Post FEV6/FVC ratio: 97 %
Pre FEV1/FVC ratio: 82 %
Pre FEV6/FVC Ratio: 100 %
RV: 1.62 L
TLC % pred: 68 %
TLC: 4.42 L

## 2013-02-01 NOTE — Progress Notes (Signed)
PFT done today. 

## 2013-09-23 ENCOUNTER — Ambulatory Visit (INDEPENDENT_AMBULATORY_CARE_PROVIDER_SITE_OTHER): Payer: Medicare HMO | Admitting: Family Medicine

## 2013-09-23 ENCOUNTER — Other Ambulatory Visit: Payer: Self-pay | Admitting: Family Medicine

## 2013-09-23 ENCOUNTER — Encounter: Payer: Self-pay | Admitting: Family Medicine

## 2013-09-23 VITALS — BP 140/80 | HR 74 | Wt 167.0 lb

## 2013-09-23 DIAGNOSIS — L821 Other seborrheic keratosis: Secondary | ICD-10-CM

## 2013-09-23 DIAGNOSIS — L570 Actinic keratosis: Secondary | ICD-10-CM

## 2013-09-23 DIAGNOSIS — L989 Disorder of the skin and subcutaneous tissue, unspecified: Secondary | ICD-10-CM

## 2013-09-23 NOTE — Progress Notes (Signed)
Pre visit review using our clinic review tool, if applicable. No additional management support is needed unless otherwise documented below in the visit note. 

## 2013-09-23 NOTE — Patient Instructions (Signed)
Keep wound dry for the first 24 hours then clean daily with soap and water for one week. Apply topical antibiotic daily for 3-4 days. Keep covered with clean dressing for 4-5 days. Follow up promptly for any signs of infection such as redness, warmth, pain, or drainage.  

## 2013-09-23 NOTE — Progress Notes (Signed)
   Subjective:    Patient ID: Drew Smith, male    DOB: 1940/12/04, 73 y.o.   MRN: [id number]  HPI  Patient seen for evaluation of multiple skin lesions. He has a couple of irritated areas on his neck which he would like to have treated with liquid nitrogen. These are problems because of shaving. He has a couple scaly areas dorsum left hand and top of scalp. Frequent sun exposure. He does wear cap and uses sun protection.  Skin lesion left forearm which came up recently couple months ago. Fairly rapid growth. No itching or bleeding. No pain.  Past Medical History  Diagnosis Date  . Arthritis   . Asthma   . Cancer     prostate  . GERD (gastroesophageal reflux disease)   . Hyperlipidemia   . Hypertension   . Guillain-Barre syndrome 2000   Past Surgical History  Procedure Laterality Date  . Eye surgery  2334    73 years old  . Nose surgery  1990  . Hernia repair  1995  . Shoulder surgery  2008  . Prostate surgery  2000    seed inplants    reports that he has never smoked. He does not have any smokeless tobacco history on file. His alcohol and drug histories are not on file. family history includes Alcohol abuse in his father; Diabetes in his brother; Hyperlipidemia in his brother; Stroke in his brother. No Known Allergies   Review of Systems  Constitutional: Negative for fever, chills, appetite change and unexpected weight change.  Hematological: Negative for adenopathy.       Objective:   Physical Exam  Constitutional: He appears well-developed and well-nourished.  Skin:  Patient has scaly non-ulcerative lesions including mid parietal scalp and a couple areas dorsum left hand. Each about 1 cm diameter.  Left dorsal forearm reveals nodular lesion with hyperkeratotic surface which is about 8 mm diameter.  Anterior neck couple of brown well demarcated slightly scaly lesions c/w seborrheic keratoses.          Assessment & Plan:  #1 actinic keratoses including  scalp and dorsum left hand. Total of 2 lesions on the scalp and 2 lesions hand were treated with liquid nitrogen #2 nodular lesion left forearm. Rule out squamous cell carcinoma.Discussed risks and benefits of shave excision including risks of bleeding, bruising, scar formation, and infection and patient consented to proceed.  Skin prepped with betadine and alcohol and shave excision of lesion with #15 blade with minimal bleeding.  Patient tolerated well.  Ag Nitrate stick used for cauterization.  Antibiotic and dressing  applied. Specimen sent to pathology for further evaluation

## 2013-10-14 ENCOUNTER — Ambulatory Visit (INDEPENDENT_AMBULATORY_CARE_PROVIDER_SITE_OTHER): Payer: Medicare HMO | Admitting: Family Medicine

## 2013-10-14 ENCOUNTER — Encounter: Payer: Self-pay | Admitting: Family Medicine

## 2013-10-14 VITALS — BP 134/78 | HR 74 | Temp 97.9°F | Wt 166.0 lb

## 2013-10-14 DIAGNOSIS — L820 Inflamed seborrheic keratosis: Secondary | ICD-10-CM

## 2013-10-14 DIAGNOSIS — D049 Carcinoma in situ of skin, unspecified: Secondary | ICD-10-CM

## 2013-10-14 NOTE — Progress Notes (Signed)
Pre visit review using our clinic review tool, if applicable. No additional management support is needed unless otherwise documented below in the visit note. 

## 2013-10-14 NOTE — Progress Notes (Signed)
   Subjective:    Patient ID: Drew Smith, male    DOB: 25-Feb-1941, 73 y.o.   MRN: [id number]  HPI Patient seen for followup regarding recent squamous cell carcinoma excised from left forearm. He also has a couple of irritated keratoses on his anterior chest and he wishes to have treated. He is actually scheduled today for a 30 minute procedure for elliptical re-excision of left forearm but states is getting ready to travel to [place] to the beach and will be doing lots of swimming over the next couple weeks and he is requesting that we delay this. His wound has healed well  Past Medical History  Diagnosis Date  . Arthritis   . Asthma   . Cancer     prostate  . GERD (gastroesophageal reflux disease)   . Hyperlipidemia   . Hypertension   . Guillain-Barre syndrome 2000   Past Surgical History  Procedure Laterality Date  . Eye surgery  864    73 years old  . Nose surgery  1990  . Hernia repair  1995  . Shoulder surgery  2008  . Prostate surgery  2000    seed inplants    reports that he has never smoked. He does not have any smokeless tobacco history on file. His alcohol and drug histories are not on file. family history includes Alcohol abuse in his father; Diabetes in his brother; Hyperlipidemia in his brother; Stroke in his brother. No Known Allergies    Review of Systems  Constitutional: Negative for fever and chills.       Objective:   Physical Exam  Constitutional: He appears well-developed and well-nourished. No distress.  Cardiovascular: Normal rate and regular rhythm.   Pulmonary/Chest: Effort normal and breath sounds normal. No respiratory distress. He has no wheezes. He has no rales.  Skin:  Left forearm wound site has healed over well. No erythema. No evidence for recurrent growth  Patient has a couple of irritated seborrheic keratoses anterior chest wall.          Assessment & Plan:  #1 recent squamous cell carcinoma skin left forearm. Removed by  shave excision. He was here initially for elliptical excision today but because of upcoming trip to [place] and wishes to delay. We'll reschedule for early October and no later.  sun protection discussed. #2 irritated seborrheic keratoses. Discussed risk and benefits of treatment with liquid nitrogen and patient consented. These were treated without difficulty

## 2013-10-15 DIAGNOSIS — D049 Carcinoma in situ of skin, unspecified: Secondary | ICD-10-CM | POA: Insufficient documentation

## 2013-11-29 ENCOUNTER — Encounter: Payer: Self-pay | Admitting: Family Medicine

## 2013-11-29 ENCOUNTER — Ambulatory Visit (INDEPENDENT_AMBULATORY_CARE_PROVIDER_SITE_OTHER): Payer: Medicare HMO | Admitting: Family Medicine

## 2013-11-29 VITALS — BP 130/70 | HR 64 | Temp 98.1°F | Wt 169.0 lb

## 2013-11-29 DIAGNOSIS — L989 Disorder of the skin and subcutaneous tissue, unspecified: Secondary | ICD-10-CM

## 2013-11-29 DIAGNOSIS — I10 Essential (primary) hypertension: Secondary | ICD-10-CM

## 2013-11-29 NOTE — Progress Notes (Signed)
Pre visit review using our clinic review tool, if applicable. No additional management support is needed unless otherwise documented below in the visit note. 

## 2013-11-29 NOTE — Progress Notes (Signed)
   Subjective:    Patient ID: Drew Smith, male    DOB: 1940/04/01, 73 y.o.   MRN: [id number]  HPI Patient seen for followup. Hypertension treated with amlodipine and HCTZ. Blood pressure stable. No recent dizziness or orthostasis.  Recent squamous cell skin cancer or shaved from left arm. We recommended elliptical reexcision and patient and rescheduled and states he has several golf events coming up and declines having this reexcised today. He states he can come by early December. We explained this should not be delayed any further.  New scaly skin lesion left side of neck. Patient's had multiple keratoses in the past. Requesting treatment with liquid nitrogen.  Past Medical History  Diagnosis Date  . Arthritis   . Asthma   . Cancer     prostate  . GERD (gastroesophageal reflux disease)   . Hyperlipidemia   . Hypertension   . Guillain-Barre syndrome 2000   Past Surgical History  Procedure Laterality Date  . Eye surgery  3855    73 years old  . Nose surgery  1990  . Hernia repair  1995  . Shoulder surgery  2008  . Prostate surgery  2000    seed inplants    reports that he has never smoked. He does not have any smokeless tobacco history on file. His alcohol and drug histories are not on file. family history includes Alcohol abuse in his father; Diabetes in his brother; Hyperlipidemia in his brother; Stroke in his brother. No Known Allergies    Review of Systems  Constitutional: Negative for fever, chills, appetite change and unexpected weight change.  Respiratory: Negative for shortness of breath.   Cardiovascular: Negative for chest pain.       Objective:   Physical Exam  Constitutional: He appears well-developed and well-nourished.  Cardiovascular: Normal rate and regular rhythm.   Pulmonary/Chest: Effort normal and breath sounds normal. No respiratory distress. He has no wheezes. He has no rales.  Skin:  Left forearm dorsally reveals small scar from previous  shave excision. No evidence for recurrent growth  Left side of neck reveals nonspecific scaly area approximately 1 mm diameter. No nodular thickening          Assessment & Plan:  #1 hypertension stable at goal. Continue current medications #2 recent squamous cell carcinoma left forearm. We have highly advised reexcision. Patient will reschedule. #3 nonspecific scaly lesion left neck. Question irritated seborrheic keratosis. Discussed risks and benefits of treatment with liquid nitrogen and patient consents.  Treated without difficulty. Will need biopsy of this area if not resolving with liquid nitrogen

## 2014-02-22 ENCOUNTER — Other Ambulatory Visit: Payer: Self-pay | Admitting: Family Medicine

## 2014-04-06 ENCOUNTER — Ambulatory Visit (INDEPENDENT_AMBULATORY_CARE_PROVIDER_SITE_OTHER): Payer: Medicare HMO | Admitting: Family Medicine

## 2014-04-06 ENCOUNTER — Encounter: Payer: Self-pay | Admitting: Family Medicine

## 2014-04-06 VITALS — BP 132/70 | HR 74 | Temp 98.5°F | Wt 171.0 lb

## 2014-04-06 DIAGNOSIS — I10 Essential (primary) hypertension: Secondary | ICD-10-CM

## 2014-04-06 DIAGNOSIS — L821 Other seborrheic keratosis: Secondary | ICD-10-CM

## 2014-04-06 NOTE — Progress Notes (Signed)
Pre visit review using our clinic review tool, if applicable. No additional management support is needed unless otherwise documented below in the visit note. 

## 2014-04-06 NOTE — Progress Notes (Signed)
   Subjective:    Patient ID: Drew Smith, male    DOB: [birth year], 74 y.o.   MRN: [id number]  HPI  Patient here requesting some skin lesions to be assessed. He has irritated skin lesion left chin region.  Also has some irritated skin lesions right temporal region. He's had seborrheic keratoses in the past. He does have a remote history of squamous cell in situ of the skin in the past.  He has remote history of Guillain-Barre syndrome and does not take flu vaccinations. Hypertension which has been stable on low-dose amlodipine and HCTZ.  Past Medical History  Diagnosis Date  . Arthritis   . Asthma   . Cancer     prostate  . GERD (gastroesophageal reflux disease)   . Hyperlipidemia   . Hypertension   . Guillain-Barre syndrome 2000   Past Surgical History  Procedure Laterality Date  . Eye surgery  424    74 years old  . Nose surgery  1990  . Hernia repair  1995  . Shoulder surgery  2008  . Prostate surgery  2000    seed inplants    reports that he has never smoked. He does not have any smokeless tobacco history on file. His alcohol and drug histories are not on file. family history includes Alcohol abuse in his father; Diabetes in his brother; Hyperlipidemia in his brother; Stroke in his brother. No Known Allergies   Review of Systems  Constitutional: Negative for fatigue.  Eyes: Negative for visual disturbance.  Respiratory: Negative for cough, chest tightness and shortness of breath.   Cardiovascular: Negative for chest pain, palpitations and leg swelling.  Neurological: Negative for dizziness, syncope, weakness, light-headedness and headaches.       Objective:   Physical Exam  Constitutional: He is oriented to person, place, and time. He appears well-developed and well-nourished.  HENT:  Right Ear: External ear normal.  Left Ear: External ear normal.  Mouth/Throat: Oropharynx is clear and moist.  Eyes: Pupils are equal, round, and reactive to light.  Neck:  Neck supple. No thyromegaly present.  Cardiovascular: Normal rate and regular rhythm.   Pulmonary/Chest: Effort normal and breath sounds normal. No respiratory distress. He has no wheezes. He has no rales.  Musculoskeletal: He exhibits no edema.  Neurological: He is alert and oriented to person, place, and time.  Skin:  He has small hyperkeratotic well-demarcated skin lesion left chin. This is only about 2 mm. He has a larger brownish well demarcated scaly lesion right temporal region. No ulceration.          Assessment & Plan:  #1 hypertension. Stable and at goal #2 irritated seborrheic keratoses. Patient requesting treatment with liquid nitrogen. Reviewed risk and benefits patient consented. Lesions including left chin and right temporal region treated without difficulty #3 health maintenance. Schedule wellness visit

## 2014-04-11 ENCOUNTER — Telehealth: Payer: Self-pay | Admitting: Family Medicine

## 2014-04-11 NOTE — Telephone Encounter (Addendum)
Pt would like to add psa to his cpx labs on 3/3.  Can you put this order in if ok?[name]

## 2014-04-12 ENCOUNTER — Other Ambulatory Visit: Payer: Self-pay | Admitting: Family Medicine

## 2014-04-12 DIAGNOSIS — Z0000 Encounter for general adult medical examination without abnormal findings: Secondary | ICD-10-CM

## 2014-04-12 NOTE — Telephone Encounter (Signed)
Labs are ordered 

## 2014-04-22 ENCOUNTER — Other Ambulatory Visit: Payer: Self-pay | Admitting: Family Medicine

## 2014-04-28 ENCOUNTER — Other Ambulatory Visit (INDEPENDENT_AMBULATORY_CARE_PROVIDER_SITE_OTHER): Payer: Medicare HMO

## 2014-04-28 DIAGNOSIS — I10 Essential (primary) hypertension: Secondary | ICD-10-CM

## 2014-04-28 DIAGNOSIS — Z8546 Personal history of malignant neoplasm of prostate: Secondary | ICD-10-CM

## 2014-04-28 DIAGNOSIS — Z0000 Encounter for general adult medical examination without abnormal findings: Secondary | ICD-10-CM

## 2014-04-28 DIAGNOSIS — E785 Hyperlipidemia, unspecified: Secondary | ICD-10-CM

## 2014-04-28 LAB — CBC WITH DIFFERENTIAL/PLATELET
BASOS PCT: 0.3 % (ref 0.0–3.0)
Basophils Absolute: 0 10*3/uL (ref 0.0–0.1)
EOS PCT: 4.6 % (ref 0.0–5.0)
Eosinophils Absolute: 0.3 10*3/uL (ref 0.0–0.7)
HEMATOCRIT: 43.2 % (ref 39.0–52.0)
Hemoglobin: 15 g/dL (ref 13.0–17.0)
Lymphocytes Relative: 26.3 % (ref 12.0–46.0)
Lymphs Abs: 1.7 10*3/uL (ref 0.7–4.0)
MCHC: 34.8 g/dL (ref 30.0–36.0)
MCV: 91.7 fl (ref 78.0–100.0)
Monocytes Absolute: 0.6 10*3/uL (ref 0.1–1.0)
Monocytes Relative: 9.5 % (ref 3.0–12.0)
Neutro Abs: 3.8 10*3/uL (ref 1.4–7.7)
Neutrophils Relative %: 59.3 % (ref 43.0–77.0)
Platelets: 231 10*3/uL (ref 150.0–400.0)
RBC: 4.72 Mil/uL (ref 4.22–5.81)
RDW: 13 % (ref 11.5–15.5)
WBC: 6.5 10*3/uL (ref 4.0–10.5)

## 2014-04-28 LAB — BASIC METABOLIC PANEL
BUN: 18 mg/dL (ref 6–23)
CO2: 33 mEq/L — ABNORMAL HIGH (ref 19–32)
Calcium: 9.2 mg/dL (ref 8.4–10.5)
Chloride: 102 mEq/L (ref 96–112)
Creatinine, Ser: 0.85 mg/dL (ref 0.40–1.50)
GFR: 93.48 mL/min (ref >60.00)
GLUCOSE: 105 mg/dL — AB (ref 70–99)
Potassium: 4.2 mEq/L (ref 3.5–5.1)
SODIUM: 137 meq/L (ref 135–145)

## 2014-04-28 LAB — TSH: TSH: 2.82 u[IU]/mL (ref 0.35–4.50)

## 2014-04-28 LAB — HEPATIC FUNCTION PANEL
ALT: 11 U/L (ref 0–53)
AST: 13 U/L (ref 0–37)
Albumin: 4 g/dL (ref 3.5–5.2)
Alkaline Phosphatase: 66 U/L (ref 39–117)
BILIRUBIN TOTAL: 0.7 mg/dL (ref 0.2–1.2)
Bilirubin, Direct: 0.1 mg/dL (ref 0.0–0.3)
Total Protein: 6.8 g/dL (ref 6.0–8.3)

## 2014-04-28 LAB — LIPID PANEL
CHOLESTEROL: 156 mg/dL (ref 0–200)
HDL: 44 mg/dL (ref >39.00)
LDL CALC: 94 mg/dL (ref 0–99)
NonHDL: 112
TRIGLYCERIDES: 90 mg/dL (ref 0.0–149.0)
Total CHOL/HDL Ratio: 4
VLDL: 18 mg/dL (ref 0.0–40.0)

## 2014-04-28 LAB — PSA: PSA: 0.01 ng/mL — AB (ref 0.10–4.00)

## 2014-05-04 ENCOUNTER — Encounter: Payer: Self-pay | Admitting: Family Medicine

## 2014-05-04 ENCOUNTER — Ambulatory Visit (INDEPENDENT_AMBULATORY_CARE_PROVIDER_SITE_OTHER): Payer: Medicare HMO | Admitting: Family Medicine

## 2014-05-04 VITALS — BP 130/68 | HR 75 | Temp 98.0°F | Ht 65.0 in | Wt 169.0 lb

## 2014-05-04 DIAGNOSIS — R05 Cough: Secondary | ICD-10-CM

## 2014-05-04 DIAGNOSIS — Z0000 Encounter for general adult medical examination without abnormal findings: Secondary | ICD-10-CM

## 2014-05-04 DIAGNOSIS — R053 Chronic cough: Secondary | ICD-10-CM

## 2014-05-04 NOTE — Progress Notes (Signed)
Pre visit review using our clinic review tool, if applicable. No additional management support is needed unless otherwise documented below in the visit note. 

## 2014-05-04 NOTE — Patient Instructions (Signed)
Qvar one puff twice daily and rinse mouth after use If cough no better in 3 weeks would discontinue the Qvar.

## 2014-05-04 NOTE — Progress Notes (Signed)
Subjective:    Patient ID: Drew Smith, male    DOB: 08/28/1940, 74 y.o.   MRN: [id number]  HPI Patient here for complete physical. His chronic problems include prior history of squamous cell skin cancer, hypertension, history of Guillain-barre' syndrome, GERD. He takes omeprazole twice daily. GERD symptoms well controlled.   Separate issue addressed from physical: He has chronic cough which he states he's had for 10-15 years. Never smoked. No postnasal drip symptoms. GERD treated with omeprazole. No ACE inhibitor use. He thinks he may have occasional wheezing off and on. Chest x-ray couple years ago showed question of COPD changes. Remote history of prostate cancer.no appetite or weight changes  Past Medical History  Diagnosis Date  . Arthritis   . Asthma   . Cancer     prostate  . GERD (gastroesophageal reflux disease)   . Hyperlipidemia   . Hypertension   . Guillain-Barre syndrome 2000   Past Surgical History  Procedure Laterality Date  . Eye surgery  6977    74 years old  . Nose surgery  1990  . Hernia repair  1995  . Shoulder surgery  2008  . Prostate surgery  2000    seed inplants    reports that he has never smoked. He does not have any smokeless tobacco history on file. His alcohol and drug histories are not on file. family history includes Alcohol abuse in his father; Diabetes in his brother; Hyperlipidemia in his brother; Stroke in his brother. No Known Allergies    Review of Systems  Constitutional: Negative for fever, activity change, appetite change, fatigue and unexpected weight change.  HENT: Negative for congestion, ear pain and trouble swallowing.   Eyes: Negative for pain and visual disturbance.  Respiratory: Positive for cough. Negative for shortness of breath and wheezing.   Cardiovascular: Negative for chest pain and palpitations.  Gastrointestinal: Negative for nausea, vomiting, abdominal pain, diarrhea, constipation, blood in stool, abdominal  distention and rectal pain.  Genitourinary: Negative for dysuria, hematuria and testicular pain.  Musculoskeletal: Negative for joint swelling and arthralgias.  Skin: Negative for rash.  Neurological: Negative for dizziness, syncope and headaches.  Hematological: Negative for adenopathy.  Psychiatric/Behavioral: Negative for confusion and dysphoric mood.       Objective:   Physical Exam  Constitutional: He is oriented to person, place, and time. He appears well-developed and well-nourished. No distress.  HENT:  Head: Normocephalic and atraumatic.  Right Ear: External ear normal.  Left Ear: External ear normal.  Mouth/Throat: Oropharynx is clear and moist.  Eyes: Conjunctivae and EOM are normal. Pupils are equal, round, and reactive to light.  Neck: Normal range of motion. Neck supple. No thyromegaly present.  Cardiovascular: Normal rate, regular rhythm and normal heart sounds.   No murmur heard. Pulmonary/Chest: No respiratory distress. He has no wheezes. He has no rales.  Abdominal: Soft. Bowel sounds are normal. He exhibits no distension and no mass. There is no tenderness. There is no rebound and no guarding.  Musculoskeletal: He exhibits no edema.  Lymphadenopathy:    He has no cervical adenopathy.  Neurological: He is alert and oriented to person, place, and time. He displays normal reflexes. No cranial nerve deficit.  Skin: No rash noted.  Psychiatric: He has a normal mood and affect.          Assessment & Plan:  Complete physical. Patient not a candidate for flu vaccine or shingles with history of guillain-barre'syndrome. Labs reviewed. Blood sugar slightly elevated at  105. We discussed dietary measures to restrict carbohydrates and sugar intake.  Chronic cough. Unclear etiology. He's had this for many years. He denies any red flags such as appetite or weight changes. Previous chest x-ray showed no concerns. Never smoked. Trial of Qvar 81 puff twice daily with sample  given. Touch base 3 weeks if no improvement

## 2014-08-17 ENCOUNTER — Ambulatory Visit (INDEPENDENT_AMBULATORY_CARE_PROVIDER_SITE_OTHER): Payer: Medicare HMO | Admitting: Family Medicine

## 2014-08-17 ENCOUNTER — Encounter: Payer: Self-pay | Admitting: Family Medicine

## 2014-08-17 VITALS — BP 130/80 | HR 65 | Temp 98.1°F | Wt 162.0 lb

## 2014-08-17 DIAGNOSIS — L84 Corns and callosities: Secondary | ICD-10-CM

## 2014-08-17 NOTE — Progress Notes (Signed)
   Subjective:    Patient ID: Drew Smith, male    DOB: Jul 21, 1940, 74 y.o.   MRN: [id number]  HPI Painful feet with ambulation over the past several weeks. This is starting to affect his gait. Pain is mostly law located on the ball of his right foot greater than left. He has thickened callused areas there bilaterally. He does have occasional numbness in his feet but no weakness. He takes chronic omeprazole. Recent CBC unremarkable. No history of B12 deficiency. No history of diabetes. Nonsmoker. No claudication symptoms.  Past Medical History  Diagnosis Date  . Arthritis   . Asthma   . Cancer     prostate  . GERD (gastroesophageal reflux disease)   . Hyperlipidemia   . Hypertension   . Guillain-Barre syndrome 2000   Past Surgical History  Procedure Laterality Date  . Eye surgery  6280    74 years old  . Nose surgery  1990  . Hernia repair  1995  . Shoulder surgery  2008  . Prostate surgery  2000    seed inplants    reports that he has never smoked. He does not have any smokeless tobacco history on file. His alcohol and drug histories are not on file. family history includes Alcohol abuse in his father; Diabetes in his brother; Hyperlipidemia in his brother; Stroke in his brother. No Known Allergies    Review of Systems  Constitutional: Negative for fatigue.  Eyes: Negative for visual disturbance.  Respiratory: Negative for cough, chest tightness and shortness of breath.   Cardiovascular: Negative for chest pain, palpitations and leg swelling.  Neurological: Negative for dizziness, syncope, weakness, light-headedness and headaches.       Objective:   Physical Exam  Constitutional: He appears well-developed and well-nourished.  Cardiovascular: Normal rate and regular rhythm.   Pulmonary/Chest: Effort normal and breath sounds normal. No respiratory distress. He has no wheezes. He has no rales.  Musculoskeletal: He exhibits no edema.  Skin:  Both feet are warm to  touch with good dorsalis pedis pulses. He has thickened hard calluses ball of both feet. Right is particularly tender. No underlying ulcerations. No evidence for plantar wart          Assessment & Plan:  Painful callus right and left feet. We discussed risk and benefits of shave/trim of both calluses. Patient consented. Using a #15 blade we gradually shaved down each callus and patient did have immediate improvement in ambulation afterwards. He is aware these have a high rate of recurrence. Consider podiatry referral if these continue to be a problem

## 2014-08-17 NOTE — Progress Notes (Signed)
Pre visit review using our clinic review tool, if applicable. No additional management support is needed unless otherwise documented below in the visit note. 

## 2014-08-17 NOTE — Patient Instructions (Signed)
Corns and Calluses Corns are small areas of thickened skin that usually occur on the top, sides, or tip of a toe. They contain a cone-shaped core with a point that can press on a nerve below. This causes pain. Calluses are areas of thickened skin that usually develop on hands, fingers, palms, soles of the feet, and heels. These are areas that experience frequent friction or pressure. CAUSES  Corns are usually the result of rubbing (friction) or pressure from shoes that are too tight or do not fit properly. Calluses are caused by repeated friction and pressure on the affected areas. SYMPTOMS  A hard growth on the skin.  Pain or tenderness under the skin.  Sometimes, redness and swelling.  Increased discomfort while wearing tight-fitting shoes. DIAGNOSIS  Your caregiver can usually tell what the problem is by doing a physical exam. TREATMENT  Removing the cause of the friction or pressure is usually the only treatment needed. However, sometimes medicines can be used to help soften the hardened, thickened areas. These medicines include salicylic acid plasters and 12% ammonium lactate lotion. These medicines should only be used under the direction of your caregiver. HOME CARE INSTRUCTIONS   Try to remove pressure from the affected area.  You may wear donut-shaped corn pads to protect your skin.  You may use a pumice stone or nonmetallic nail file to gently reduce the thickness of a corn.  Wear properly fitted footwear.  If you have calluses on the hands, wear gloves during activities that cause friction.  If you have diabetes, you should regularly examine your feet. Tell your caregiver if you notice any problems with your feet. SEEK IMMEDIATE MEDICAL CARE IF:   You have increased pain, swelling, redness, or warmth in the affected area.  Your corn or callus starts to drain fluid or bleeds.  You are not getting better, even with treatment. Document Released: 11/18/2003 Document  Revised: 05/06/2011 Document Reviewed: 10/09/2010 [employer] Patient Information 2015 [employer]. This information is not intended to replace advice given to you by your health care provider. Make sure you discuss any questions you have with your health care provider.  

## 2014-08-23 ENCOUNTER — Ambulatory Visit (INDEPENDENT_AMBULATORY_CARE_PROVIDER_SITE_OTHER): Payer: Medicare HMO | Admitting: Internal Medicine

## 2014-08-23 ENCOUNTER — Encounter: Payer: Self-pay | Admitting: Internal Medicine

## 2014-08-23 VITALS — BP 142/76 | HR 64 | Temp 98.3°F | Resp 20 | Ht 65.0 in | Wt 165.0 lb

## 2014-08-23 DIAGNOSIS — M79671 Pain in right foot: Secondary | ICD-10-CM | POA: Diagnosis not present

## 2014-08-23 NOTE — Patient Instructions (Addendum)
Podiatry follow-up if unimproved  Corns and Calluses Corns are small areas of thickened skin that usually occur on the top, sides, or tip of a toe. They contain a cone-shaped core with a point that can press on a nerve below. This causes pain. Calluses are areas of thickened skin that usually develop on hands, fingers, palms, soles of the feet, and heels. These are areas that experience frequent friction or pressure. CAUSES  Corns are usually the result of rubbing (friction) or pressure from shoes that are too tight or do not fit properly. Calluses are caused by repeated friction and pressure on the affected areas. SYMPTOMS  A hard growth on the skin.  Pain or tenderness under the skin.  Sometimes, redness and swelling.  Increased discomfort while wearing tight-fitting shoes. DIAGNOSIS  Your caregiver can usually tell what the problem is by doing a physical exam. TREATMENT  Removing the cause of the friction or pressure is usually the only treatment needed. However, sometimes medicines can be used to help soften the hardened, thickened areas. These medicines include salicylic acid plasters and 12% ammonium lactate lotion. These medicines should only be used under the direction of your caregiver. HOME CARE INSTRUCTIONS   Try to remove pressure from the affected area.  You may wear donut-shaped corn pads to protect your skin.  You may use a pumice stone or nonmetallic nail file to gently reduce the thickness of a corn.  Wear properly fitted footwear.  If you have calluses on the hands, wear gloves during activities that cause friction.  If you have diabetes, you should regularly examine your feet. Tell your caregiver if you notice any problems with your feet. SEEK IMMEDIATE MEDICAL CARE IF:   You have increased pain, swelling, redness, or warmth in the affected area.  Your corn or callus starts to drain fluid or bleeds.  You are not getting better, even with treatment. Document  Released: 11/18/2003 Document Revised: 05/06/2011 Document Reviewed: 10/09/2010 [facility] Patient Information 2015 [place], [place]. This information is not intended to replace advice given to you by your health care provider. Make sure you discuss any questions you have with your health care provider.

## 2014-08-23 NOTE — Progress Notes (Signed)
Pre visit review using our clinic review tool, if applicable. No additional management support is needed unless otherwise documented below in the visit note. 

## 2014-08-23 NOTE — Progress Notes (Signed)
Subjective:    Patient ID: Drew Smith, male    DOB: [birth year], 74 y.o.   MRN: [id number]  HPI 74 year old patient who is seen today in follow-up complaining of a right painful foot.  He was seen 6 days ago with pain over the first metatarsal heads bilaterally.  Both calluses over this area were trimmed.  He continues to have pain over the right first MTP joint, but no left foot pain.  This interferes with his golf activities.  Past Medical History  Diagnosis Date  . Arthritis   . Asthma   . Cancer     prostate  . GERD (gastroesophageal reflux disease)   . Hyperlipidemia   . Hypertension   . Guillain-Barre syndrome 2000    History   Social History  . Marital Status: Divorced    Spouse Name: N/A  . Number of Children: N/A  . Years of Education: N/A   Occupational History  . Not on file.   Social History Main Topics  . Smoking status: Never Smoker   . Smokeless tobacco: Not on file  . Alcohol Use: Not on file  . Drug Use: Not on file  . Sexual Activity: Not on file   Other Topics Concern  . Not on file   Social History Narrative    Past Surgical History  Procedure Laterality Date  . Eye surgery  7256    74 years old  . Nose surgery  1990  . Hernia repair  1995  . Shoulder surgery  2008  . Prostate surgery  2000    seed inplants    Family History  Problem Relation Age of Onset  . Alcohol abuse Father   . Hyperlipidemia Brother   . Stroke Brother   . Diabetes Brother     type ll    No Known Allergies  Current Outpatient Prescriptions on File Prior to Visit  Medication Sig Dispense Refill  . acyclovir (ZOVIRAX) 400 MG tablet TAKE 1 TABLET (400 MG TOTAL) BY MOUTH AS NEEDED. 90 tablet 2  . amLODipine (NORVASC) 2.5 MG tablet Take 1 tablet (2.5 mg total) by mouth daily. 90 tablet 3  . aspirin 81 MG tablet Take 81 mg by mouth as needed.     . desonide (DESOWEN) 0.05 % cream APPLY TOPICALLY AS NEEDED. 30 g 2  . diphenhydrAMINE-zinc acetate  (BENADRYL) cream Apply 1 application topically daily as needed for itching.    . fluorouracil (EFUDEX) 5 % cream Apply topically as needed. Per Dr Joaquim Lai    . hydrochlorothiazide (MICROZIDE) 12.5 MG capsule Take 1 capsule (12.5 mg total) by mouth daily. 90 capsule 3  . ibuprofen (ADVIL,MOTRIN) 600 MG tablet Only use 800 mg  8 hours sparingly. 60 tablet 0  . LORazepam (ATIVAN) 0.5 MG tablet Take one tablet one hour prior to procedure 4 tablet 0  . omeprazole (PRILOSEC) 20 MG capsule TAKE 1 CAPSULE (20 MG TOTAL) BY MOUTH 2 (TWO) TIMES DAILY. 180 capsule 1  . tadalafil (CIALIS) 5 MG tablet Take 5 mg by mouth daily.     No current facility-administered medications on file prior to visit.    BP 142/76 mmHg  Pulse 64  Temp(Src) 98.3 F (36.8 C) (Oral)  Resp 20  Ht 5\' 5"  (1.651 m)  Wt 165 lb (74.844 kg)  BMI 27.46 kg/m2  SpO2 98%      Review of Systems  Musculoskeletal: Positive for gait problem.       Right plantar foot pain  Objective:   Physical Exam  Constitutional: He appears well-developed and well-nourished. No distress.  Skin:  Modest callus formation noted over plantar first MTP joint bilaterally Slightly tender to touch          Assessment & Plan:   Painful feet.  Over the first MTP joint bilaterally.  Modest callus formation that is improved.  The patient will continue to work on the callus and consider podiatry referral.  Foot care discussed

## 2014-09-07 ENCOUNTER — Encounter: Payer: Self-pay | Admitting: Podiatry

## 2014-09-07 ENCOUNTER — Ambulatory Visit (INDEPENDENT_AMBULATORY_CARE_PROVIDER_SITE_OTHER): Payer: Medicare HMO | Admitting: Podiatry

## 2014-09-07 VITALS — BP 146/78 | HR 65 | Ht 67.0 in | Wt 165.0 lb

## 2014-09-07 DIAGNOSIS — M79606 Pain in leg, unspecified: Secondary | ICD-10-CM

## 2014-09-07 DIAGNOSIS — Q828 Other specified congenital malformations of skin: Secondary | ICD-10-CM | POA: Insufficient documentation

## 2014-09-07 DIAGNOSIS — M21969 Unspecified acquired deformity of unspecified lower leg: Secondary | ICD-10-CM

## 2014-09-07 NOTE — Patient Instructions (Signed)
Seen for pain under the big joint. Painful callus on both feet debrided. May benefit from custom orthotics. Return as needed.

## 2014-09-07 NOTE — Progress Notes (Signed)
SUBJECTIVE: 74 y.o. year old male presents complaining of pain under the first MPJ bilateral for 2 months.  Plays Gold frequently and hurts to be on feet.  OBJECTIVE: DERMATOLOGIC EXAMINATION: Porokeratotic lesion, 0.3 cm in diameter under first MPJ right painful. Broad thick callus under first MPJ left painful.  No redness or edema associated with the lesions.  VASCULAR EXAMINATION OF LOWER LIMBS: Pedal pulses: All pedal pulses are palpable with normal pulsation.  NEUROLOGIC EXAMINATION OF THE LOWER LIMBS: All epicritic and tactile sensations grossly intact.  MUSCULOSKELETAL EXAMINATION: Forefoot cavus type foot with limited range of motion bilateral. Plantar flexed first ray bilateral.   ASSESSMENT: Symptomatic porokeratosis sub 1 bilateral. Forefoot cavus with plantar flexed first ray bilateral.  PLAN: Reviewed clinical findings and available treatment options. All lesions debrided. Casted for orthotics.

## 2014-10-28 ENCOUNTER — Other Ambulatory Visit: Payer: Self-pay | Admitting: Family Medicine

## 2014-11-07 ENCOUNTER — Ambulatory Visit: Payer: Medicare HMO | Admitting: Podiatry

## 2015-02-13 DIAGNOSIS — H35413 Lattice degeneration of retina, bilateral: Secondary | ICD-10-CM | POA: Diagnosis not present

## 2015-02-13 DIAGNOSIS — H2513 Age-related nuclear cataract, bilateral: Secondary | ICD-10-CM | POA: Diagnosis not present

## 2015-02-13 DIAGNOSIS — H25013 Cortical age-related cataract, bilateral: Secondary | ICD-10-CM | POA: Diagnosis not present

## 2015-03-21 DIAGNOSIS — M9905 Segmental and somatic dysfunction of pelvic region: Secondary | ICD-10-CM | POA: Diagnosis not present

## 2015-03-21 DIAGNOSIS — M9903 Segmental and somatic dysfunction of lumbar region: Secondary | ICD-10-CM | POA: Diagnosis not present

## 2015-03-21 DIAGNOSIS — M9904 Segmental and somatic dysfunction of sacral region: Secondary | ICD-10-CM | POA: Diagnosis not present

## 2015-03-21 DIAGNOSIS — M5136 Other intervertebral disc degeneration, lumbar region: Secondary | ICD-10-CM | POA: Diagnosis not present

## 2015-03-24 ENCOUNTER — Ambulatory Visit (INDEPENDENT_AMBULATORY_CARE_PROVIDER_SITE_OTHER): Payer: Medicare HMO | Admitting: Family Medicine

## 2015-03-24 DIAGNOSIS — I10 Essential (primary) hypertension: Secondary | ICD-10-CM

## 2015-03-24 NOTE — Patient Instructions (Signed)
Continue with the HCTZ 12.5 mg once daily INCREASE the Amlodipine 2.5 mg to two tablets daily until follow up.

## 2015-03-24 NOTE — Progress Notes (Signed)
   Subjective:    Patient ID: Drew Smith, male    DOB: 1940/12/15, 75 y.o.   MRN: [id number]  HPI Patient here with elevated blood pressure. He has long history of hypertension and apparently took himself off of amlodipine and hydrochlorothiazide several months ago. Not clear why he took himself off. He went to have dental procedure yesterday and was told that he had a blood pressure systolic of [id number]. He has had some dull headache past few days. He relates some recent increased stress issues. No major headache today. Has had some increased malaise. Not monitoring blood pressure at home. He started himself back on amlodipine 2.5 mg and HCTZ 12.5 mg yesterday.  Consumes about 6 ounces of wine per day. Nonsmoker. No recent chest pains. Denies any dizziness. No peripheral edema. No nonsteroidal use. No decongestant use.  Past Medical History  Diagnosis Date  . Arthritis   . Asthma   . Cancer     prostate  . GERD (gastroesophageal reflux disease)   . Hyperlipidemia   . Hypertension   . Guillain-Barre syndrome 2000   Past Surgical History  Procedure Laterality Date  . Eye surgery  8457    75 years old  . Nose surgery  1990  . Hernia repair  1995  . Shoulder surgery  2008  . Prostate surgery  2000    seed inplants    reports that he has never smoked. He has never used smokeless tobacco. His alcohol and drug histories are not on file. family history includes Alcohol abuse in his father; Diabetes in his brother; Hyperlipidemia in his brother; Stroke in his brother. No Known Allergies    Review of Systems  Constitutional: Positive for fatigue.  Eyes: Negative for visual disturbance.  Respiratory: Negative for cough, chest tightness and shortness of breath.   Cardiovascular: Negative for chest pain, palpitations and leg swelling.  Gastrointestinal: Negative for abdominal pain.  Neurological: Positive for headaches. Negative for dizziness, syncope, weakness and light-headedness.    Psychiatric/Behavioral: Negative for confusion.       Objective:   Physical Exam  Constitutional: He appears well-developed and well-nourished.  Eyes: Pupils are equal, round, and reactive to light.  Neck: Neck supple. No thyromegaly present.  Cardiovascular: Normal rate and regular rhythm.   Pulmonary/Chest: Effort normal and breath sounds normal. No respiratory distress. He has no wheezes. He has no rales.  Musculoskeletal: He exhibits no edema.  Neurological: He is alert.  Psychiatric: He has a normal mood and affect. His behavior is normal.          Assessment & Plan:  Severe hypertension. This is an exacerbation of a chronic problem. Blood pressure left arm seated after rest 162/80. He had initial blood pressure here today 170/90. We've recommended increasing his amlodipine to 5 mg daily and continue HCTZ 12.5 mg daily and reassess one week. We discussed limiting alcohol consumption and watch sodium consumption closely. He'll get his blood pressure monitored at home in the meantime. We also discussed dietary factors and how this may relate to worsening his blood pressure with increased sodium

## 2015-03-27 DIAGNOSIS — M9905 Segmental and somatic dysfunction of pelvic region: Secondary | ICD-10-CM | POA: Diagnosis not present

## 2015-03-27 DIAGNOSIS — M5136 Other intervertebral disc degeneration, lumbar region: Secondary | ICD-10-CM | POA: Diagnosis not present

## 2015-03-27 DIAGNOSIS — M9904 Segmental and somatic dysfunction of sacral region: Secondary | ICD-10-CM | POA: Diagnosis not present

## 2015-03-27 DIAGNOSIS — M9903 Segmental and somatic dysfunction of lumbar region: Secondary | ICD-10-CM | POA: Diagnosis not present

## 2015-03-29 ENCOUNTER — Encounter: Payer: Self-pay | Admitting: Family Medicine

## 2015-03-29 ENCOUNTER — Ambulatory Visit (INDEPENDENT_AMBULATORY_CARE_PROVIDER_SITE_OTHER): Payer: Medicare HMO | Admitting: Family Medicine

## 2015-03-29 VITALS — BP 130/68 | HR 86 | Temp 98.5°F | Ht 67.0 in | Wt 165.7 lb

## 2015-03-29 DIAGNOSIS — I10 Essential (primary) hypertension: Secondary | ICD-10-CM | POA: Diagnosis not present

## 2015-03-29 MED ORDER — HYDROCHLOROTHIAZIDE 12.5 MG PO CAPS: 12.5000 mg | ORAL_CAPSULE | Freq: Every day | ORAL | Status: DC

## 2015-03-29 MED ORDER — AMLODIPINE BESYLATE 5 MG PO TABS: 5.0000 mg | ORAL_TABLET | Freq: Every day | ORAL | Status: DC

## 2015-03-29 NOTE — Progress Notes (Signed)
   Subjective:    Patient ID: Drew Smith, male    DOB: [birth year], 75 y.o.   MRN: [id number]  HPI  patient seen for follow-up hypertension. He came in last week with fairly severe reading but had not been taking his blood pressure medications. He is now back on amlodipine 5 mg daily and HCTZ 12.5 mgs daily. He does not have a home blood pressure cuff. He has not had any headaches. He was at his dentist this morning and did have elevated reading there [id number] systolic.  Past Medical History  Diagnosis Date  . Arthritis   . Asthma   . Cancer [facility])     prostate  . GERD (gastroesophageal reflux disease)   . Hyperlipidemia   . Hypertension   . Guillain-Barre syndrome ([place]) 2000   Past Surgical History  Procedure Laterality Date  . Eye surgery  1239    75 years old  . Nose surgery  1990  . Hernia repair  1995  . Shoulder surgery  2008  . Prostate surgery  2000    seed inplants    reports that he has never smoked. He has never used smokeless tobacco. His alcohol and drug histories are not on file. family history includes Alcohol abuse in his father; Diabetes in his brother; Hyperlipidemia in his brother; Stroke in his brother. No Known Allergies    Review of Systems  Constitutional: Negative for fatigue.  Eyes: Negative for visual disturbance.  Respiratory: Negative for cough, chest tightness and shortness of breath.   Cardiovascular: Negative for chest pain, palpitations and leg swelling.  Neurological: Negative for dizziness, syncope, weakness, light-headedness and headaches.       Objective:   Physical Exam  Constitutional: He appears well-developed and well-nourished.  Neck: No thyromegaly present.  Cardiovascular: Normal rate and regular rhythm.   Pulmonary/Chest: Effort normal and breath sounds normal. No respiratory distress. He has no wheezes. He has no rales.  Musculoskeletal: He exhibits no edema.          Assessment & Plan:   Hypertension. Greatly  improved. Repeat left arm seated after rest 130/68. Continue current regimen of amlodipine and HCTZ with refills given. Routine follow-up 3 months.

## 2015-03-31 DIAGNOSIS — M5136 Other intervertebral disc degeneration, lumbar region: Secondary | ICD-10-CM | POA: Diagnosis not present

## 2015-03-31 DIAGNOSIS — M545 Low back pain: Secondary | ICD-10-CM | POA: Diagnosis not present

## 2015-03-31 DIAGNOSIS — M9903 Segmental and somatic dysfunction of lumbar region: Secondary | ICD-10-CM | POA: Diagnosis not present

## 2015-03-31 DIAGNOSIS — M9904 Segmental and somatic dysfunction of sacral region: Secondary | ICD-10-CM | POA: Diagnosis not present

## 2015-03-31 DIAGNOSIS — M9905 Segmental and somatic dysfunction of pelvic region: Secondary | ICD-10-CM | POA: Diagnosis not present

## 2015-04-04 DIAGNOSIS — M9905 Segmental and somatic dysfunction of pelvic region: Secondary | ICD-10-CM | POA: Diagnosis not present

## 2015-04-04 DIAGNOSIS — M9904 Segmental and somatic dysfunction of sacral region: Secondary | ICD-10-CM | POA: Diagnosis not present

## 2015-04-04 DIAGNOSIS — M545 Low back pain: Secondary | ICD-10-CM | POA: Diagnosis not present

## 2015-04-04 DIAGNOSIS — M9903 Segmental and somatic dysfunction of lumbar region: Secondary | ICD-10-CM | POA: Diagnosis not present

## 2015-04-04 DIAGNOSIS — M5136 Other intervertebral disc degeneration, lumbar region: Secondary | ICD-10-CM | POA: Diagnosis not present

## 2015-04-07 DIAGNOSIS — M545 Low back pain: Secondary | ICD-10-CM | POA: Diagnosis not present

## 2015-04-10 DIAGNOSIS — M9905 Segmental and somatic dysfunction of pelvic region: Secondary | ICD-10-CM | POA: Diagnosis not present

## 2015-04-10 DIAGNOSIS — M9904 Segmental and somatic dysfunction of sacral region: Secondary | ICD-10-CM | POA: Diagnosis not present

## 2015-04-10 DIAGNOSIS — M5136 Other intervertebral disc degeneration, lumbar region: Secondary | ICD-10-CM | POA: Diagnosis not present

## 2015-04-10 DIAGNOSIS — M545 Low back pain: Secondary | ICD-10-CM | POA: Diagnosis not present

## 2015-04-10 DIAGNOSIS — M9903 Segmental and somatic dysfunction of lumbar region: Secondary | ICD-10-CM | POA: Diagnosis not present

## 2015-04-13 DIAGNOSIS — M5136 Other intervertebral disc degeneration, lumbar region: Secondary | ICD-10-CM | POA: Diagnosis not present

## 2015-04-13 DIAGNOSIS — M9903 Segmental and somatic dysfunction of lumbar region: Secondary | ICD-10-CM | POA: Diagnosis not present

## 2015-04-13 DIAGNOSIS — M9904 Segmental and somatic dysfunction of sacral region: Secondary | ICD-10-CM | POA: Diagnosis not present

## 2015-04-13 DIAGNOSIS — M9905 Segmental and somatic dysfunction of pelvic region: Secondary | ICD-10-CM | POA: Diagnosis not present

## 2015-04-13 DIAGNOSIS — M545 Low back pain: Secondary | ICD-10-CM | POA: Diagnosis not present

## 2015-04-18 DIAGNOSIS — R69 Illness, unspecified: Secondary | ICD-10-CM | POA: Diagnosis not present

## 2015-05-06 DIAGNOSIS — Z0100 Encounter for examination of eyes and vision without abnormal findings: Secondary | ICD-10-CM | POA: Diagnosis not present

## 2015-05-13 DIAGNOSIS — R69 Illness, unspecified: Secondary | ICD-10-CM | POA: Diagnosis not present

## 2015-06-27 ENCOUNTER — Encounter: Payer: Self-pay | Admitting: Family Medicine

## 2015-06-27 ENCOUNTER — Ambulatory Visit (INDEPENDENT_AMBULATORY_CARE_PROVIDER_SITE_OTHER): Payer: Medicare HMO | Admitting: Family Medicine

## 2015-06-27 VITALS — BP 118/70 | HR 62 | Temp 98.3°F | Ht 67.0 in | Wt 164.7 lb

## 2015-06-27 DIAGNOSIS — L570 Actinic keratosis: Secondary | ICD-10-CM | POA: Diagnosis not present

## 2015-06-27 DIAGNOSIS — R059 Cough, unspecified: Secondary | ICD-10-CM

## 2015-06-27 DIAGNOSIS — L821 Other seborrheic keratosis: Secondary | ICD-10-CM | POA: Diagnosis not present

## 2015-06-27 DIAGNOSIS — R05 Cough: Secondary | ICD-10-CM | POA: Diagnosis not present

## 2015-06-27 NOTE — Progress Notes (Signed)
Subjective:    Patient ID: Drew Smith, male    DOB: Oct 29, 1940, 75 y.o.   MRN: [id number]  HPI Patient seen today for the following issues  Cough for 7-10 days. No fever. No shortness of breath. Possibly some mild wheezing intermittently. Mild nasal congestive symptoms. Does not feel particularly ill overall. Cough is not keeping him awake at night Nonsmoker. No hemoptysis. No appetite or weight changes.  Patient has a couple of scaly thickened plaques dorsum of the right and left hand. Mild itching. No bleeding. Patient also has multiple seborrheic keratoses on the face. Requesting treatment.  Past Medical History  Diagnosis Date  . Arthritis   . Asthma   . Cancer [facility])     prostate  . GERD (gastroesophageal reflux disease)   . Hyperlipidemia   . Hypertension   . Guillain-Barre syndrome ([place]) 2000   Past Surgical History  Procedure Laterality Date  . Eye surgery  3744    75 years old  . Nose surgery  1990  . Hernia repair  1995  . Shoulder surgery  2008  . Prostate surgery  2000    seed inplants    reports that he has never smoked. He has never used smokeless tobacco. His alcohol and drug histories are not on file. family history includes Alcohol abuse in his father; Diabetes in his brother; Hyperlipidemia in his brother; Stroke in his brother. No Known Allergies    Review of Systems  Constitutional: Negative for fever, chills, activity change, appetite change, fatigue and unexpected weight change.  HENT: Negative for congestion, ear pain, sore throat and trouble swallowing.   Respiratory: Positive for cough. Negative for shortness of breath, wheezing and stridor.   Cardiovascular: Negative for chest pain and leg swelling.  Gastrointestinal: Negative for abdominal pain.  Musculoskeletal: Negative for arthralgias.  Skin: Negative for rash.  Neurological: Negative for syncope and headaches.  Hematological: Negative for adenopathy.       Objective:   Physical Exam  Constitutional: He is oriented to person, place, and time. He appears well-developed and well-nourished. No distress.  HENT:  Head: Normocephalic and atraumatic.  Eyes: Pupils are equal, round, and reactive to light.  Neck: Normal range of motion. Neck supple. No thyromegaly present.  Cardiovascular: Normal rate and regular rhythm.   No murmur heard. Pulmonary/Chest: Effort normal. No stridor. No respiratory distress. He has no wheezes. He has no rales.  Abdominal: Soft. Bowel sounds are normal. There is no tenderness.  Musculoskeletal: He exhibits no edema.  Lymphadenopathy:    He has no cervical adenopathy.  Neurological: He is alert and oriented to person, place, and time.  Skin:  He has a couple of small hyperkeratotic areas one on the right hand dorsally and the one on the left hand dorsally. These are both fairly small about 3 mm diameter. No ulceration. These are not nodular  Multiple scattered seborrheic keratoses on the for head and face. No atypical features  Psychiatric: He has a normal mood and affect.          Assessment & Plan:  #1 cough. Suspect related to acute viral bronchitis. Nonfocal exam. Reassurance. Follow-up promptly for any fever or change of symptoms  #2 small actinic keratoses of the hands. Discussed treatment with liquid nitrogen. We discussed risk and benefits and patient consented. We treated one lesion on the dorsum of the right hand and one lesional dorsum of the left hand  #3 seborrheic keratoses of the face. Reassured these are  benign. Patient requested treatment of a couple of larger ones on the right side of the face and left side. These were treated without difficulty.  [name] MD [facility] at [facility]

## 2015-06-27 NOTE — Patient Instructions (Signed)

## 2015-06-27 NOTE — Progress Notes (Signed)
Pre visit review using our clinic review tool, if applicable. No additional management support is needed unless otherwise documented below in the visit note. 

## 2015-08-25 DIAGNOSIS — G44209 Tension-type headache, unspecified, not intractable: Secondary | ICD-10-CM | POA: Diagnosis not present

## 2015-08-25 DIAGNOSIS — M9901 Segmental and somatic dysfunction of cervical region: Secondary | ICD-10-CM | POA: Diagnosis not present

## 2015-08-25 DIAGNOSIS — H25013 Cortical age-related cataract, bilateral: Secondary | ICD-10-CM | POA: Diagnosis not present

## 2015-08-25 DIAGNOSIS — M5031 Other cervical disc degeneration,  high cervical region: Secondary | ICD-10-CM | POA: Diagnosis not present

## 2015-08-25 DIAGNOSIS — M9902 Segmental and somatic dysfunction of thoracic region: Secondary | ICD-10-CM | POA: Diagnosis not present

## 2015-08-25 DIAGNOSIS — H2510 Age-related nuclear cataract, unspecified eye: Secondary | ICD-10-CM | POA: Diagnosis not present

## 2015-10-20 ENCOUNTER — Ambulatory Visit (INDEPENDENT_AMBULATORY_CARE_PROVIDER_SITE_OTHER): Payer: Medicare HMO | Admitting: Family Medicine

## 2015-10-20 ENCOUNTER — Encounter: Payer: Self-pay | Admitting: Family Medicine

## 2015-10-20 VITALS — BP 100/64 | HR 70 | Temp 98.6°F | Ht 67.0 in | Wt 155.0 lb

## 2015-10-20 DIAGNOSIS — N529 Male erectile dysfunction, unspecified: Secondary | ICD-10-CM

## 2015-10-20 DIAGNOSIS — L821 Other seborrheic keratosis: Secondary | ICD-10-CM

## 2015-10-20 MED ORDER — SILDENAFIL CITRATE 100 MG PO TABS: 100.0000 mg | ORAL_TABLET | Freq: Every day | ORAL | 6 refills | Status: DC | PRN

## 2015-10-20 NOTE — Progress Notes (Signed)
Subjective:     Patient ID: Drew Smith, male   DOB: 1940/12/02, 75 y.o.   MRN: [id number]  HPI Patient seen to have several skin lesions evaluated and also to request medication for erectile dysfunction. He recently tried Cialis 20 mg but did not see much effect. He previously used Viagra 100 mg which seemed to work more effectively. No nitroglycerin use. No contraindications.  He has history of multiple seborrheic keratoses on his face and requests having several lesions looked at today  Past Medical History:  Diagnosis Date  . Arthritis   . Asthma   . Cancer [facility])    prostate  . GERD (gastroesophageal reflux disease)   . Guillain-Barre syndrome ([place]) 2000  . Hyperlipidemia   . Hypertension    Past Surgical History:  Procedure Laterality Date  . EYE SURGERY  5868   75 years old  . HERNIA REPAIR  1995  . NOSE SURGERY  1990  . PROSTATE SURGERY  2000   seed inplants  . SHOULDER SURGERY  2008    reports that he has never smoked. He has never used smokeless tobacco. His alcohol and drug histories are not on file. family history includes Alcohol abuse in his father; Diabetes in his brother; Hyperlipidemia in his brother; Stroke in his brother. No Known Allergies'     Review of Systems  Constitutional: Negative for fatigue.  Eyes: Negative for visual disturbance.  Respiratory: Negative for cough, chest tightness and shortness of breath.   Cardiovascular: Negative for chest pain, palpitations and leg swelling.  Neurological: Negative for dizziness, syncope, weakness, light-headedness and headaches.       Objective:   Physical Exam  Constitutional: He is oriented to person, place, and time. He appears well-developed and well-nourished.  HENT:  Right Ear: External ear normal.  Left Ear: External ear normal.  Mouth/Throat: Oropharynx is clear and moist.  Eyes: Pupils are equal, round, and reactive to light.  Neck: Neck supple. No thyromegaly present.  Cardiovascular:  Normal rate and regular rhythm.   Pulmonary/Chest: Effort normal and breath sounds normal. No respiratory distress. He has no wheezes. He has no rales.  Musculoskeletal: He exhibits no edema.  Neurological: He is alert and oriented to person, place, and time.  Skin:  Patient has several well-demarcated brownish lesions on his face and trunk compatible with seborrheic keratoses. Right temporal region reveals couple of whitish hyperkeratotic lesions. No ulcerative lesions.       Assessment:     #1 erectile dysfunction  #2 seborrheic keratoses and actinic keratoses of the face    Plan:     -Viagra 100 mg one half to one tablet daily as needed for erectile dysfunction -Reassurance regarding seborrheic keratoses. We've recommended treatment with liquid nitrogen of actinic keratoses right side of face but patient is going out of town. He will return in approximately one month for treatment of those  [name] MD [facility] at [facility]

## 2015-10-20 NOTE — Progress Notes (Signed)
Pre visit review using our clinic review tool, if applicable. No additional management support is needed unless otherwise documented below in the visit note. 

## 2015-10-23 ENCOUNTER — Telehealth: Payer: Self-pay | Admitting: Family Medicine

## 2015-10-23 MED ORDER — SILDENAFIL CITRATE 100 MG PO TABS: 100.0000 mg | ORAL_TABLET | Freq: Every day | ORAL | 2 refills | Status: DC | PRN

## 2015-10-23 NOTE — Telephone Encounter (Signed)
Medication sent into the pharmacy for patient.

## 2015-10-23 NOTE — Telephone Encounter (Signed)
Pt can not afford viagra per pharm sildenafil 20 mg #20. [facility] [place] [address] [place]

## 2015-10-25 ENCOUNTER — Other Ambulatory Visit: Payer: Self-pay | Admitting: *Deleted

## 2015-10-25 MED ORDER — SILDENAFIL CITRATE 20 MG PO TABS
ORAL_TABLET | ORAL | 0 refills | Status: DC
Start: 2015-10-25 — End: 2017-10-09

## 2015-10-31 DIAGNOSIS — M5136 Other intervertebral disc degeneration, lumbar region: Secondary | ICD-10-CM | POA: Diagnosis not present

## 2015-10-31 DIAGNOSIS — M50322 Other cervical disc degeneration at C5-C6 level: Secondary | ICD-10-CM | POA: Diagnosis not present

## 2015-10-31 DIAGNOSIS — M9903 Segmental and somatic dysfunction of lumbar region: Secondary | ICD-10-CM | POA: Diagnosis not present

## 2015-10-31 DIAGNOSIS — M9901 Segmental and somatic dysfunction of cervical region: Secondary | ICD-10-CM | POA: Diagnosis not present

## 2015-12-08 ENCOUNTER — Encounter: Payer: Self-pay | Admitting: Family Medicine

## 2015-12-08 ENCOUNTER — Ambulatory Visit (INDEPENDENT_AMBULATORY_CARE_PROVIDER_SITE_OTHER): Payer: Medicare HMO | Admitting: Family Medicine

## 2015-12-08 VITALS — BP 130/74 | HR 67 | Temp 98.7°F | Ht 67.0 in | Wt 156.7 lb

## 2015-12-08 DIAGNOSIS — G629 Polyneuropathy, unspecified: Secondary | ICD-10-CM

## 2015-12-08 LAB — CBC WITH DIFFERENTIAL/PLATELET
Basophils Absolute: 0 10*3/uL (ref 0.0–0.1)
Basophils Relative: 0.3 % (ref 0.0–3.0)
EOS ABS: 0.4 10*3/uL (ref 0.0–0.7)
Eosinophils Relative: 5.9 % — ABNORMAL HIGH (ref 0.0–5.0)
HCT: 42.9 % (ref 39.0–52.0)
HEMOGLOBIN: 14.5 g/dL (ref 13.0–17.0)
Lymphocytes Relative: 21.5 % (ref 12.0–46.0)
Lymphs Abs: 1.4 10*3/uL (ref 0.7–4.0)
MCHC: 33.8 g/dL (ref 30.0–36.0)
MCV: 93.5 fl (ref 78.0–100.0)
MONO ABS: 0.6 10*3/uL (ref 0.1–1.0)
Monocytes Relative: 9.1 % (ref 3.0–12.0)
NEUTROS PCT: 63.2 % (ref 43.0–77.0)
Neutro Abs: 4.3 10*3/uL (ref 1.4–7.7)
Platelets: 224 10*3/uL (ref 150.0–400.0)
RBC: 4.59 Mil/uL (ref 4.22–5.81)
RDW: 13.1 % (ref 11.5–15.5)
WBC: 6.7 10*3/uL (ref 4.0–10.5)

## 2015-12-08 LAB — HEMOGLOBIN A1C: Hgb A1c MFr Bld: 5.4 % (ref 4.6–6.5)

## 2015-12-08 LAB — BASIC METABOLIC PANEL
BUN: 18 mg/dL (ref 6–23)
CALCIUM: 9.2 mg/dL (ref 8.4–10.5)
CO2: 30 mEq/L (ref 19–32)
CREATININE: 0.96 mg/dL (ref 0.40–1.50)
Chloride: 104 mEq/L (ref 96–112)
GFR: 80.88 mL/min (ref >60.00)
Glucose, Bld: 110 mg/dL — ABNORMAL HIGH (ref 70–99)
Potassium: 4.2 mEq/L (ref 3.5–5.1)
Sodium: 140 mEq/L (ref 135–145)

## 2015-12-08 LAB — VITAMIN B12: VITAMIN B 12: 314 pg/mL (ref 211–911)

## 2015-12-08 LAB — TSH: TSH: 1.68 u[IU]/mL (ref 0.35–4.50)

## 2015-12-08 MED ORDER — DESONIDE 0.05 % EX CREA: TOPICAL_CREAM | Freq: Two times a day (BID) | CUTANEOUS | 1 refills | Status: DC

## 2015-12-08 NOTE — Progress Notes (Signed)
Pre visit review using our clinic review tool, if applicable. No additional management support is needed unless otherwise documented below in the visit note. 

## 2015-12-08 NOTE — Progress Notes (Signed)
Subjective:     Patient ID: Drew Smith, male   DOB: [birth year], 75 y.o.   MRN: [id number]  HPI Patient seen with bilateral foot numbness. Has not had total numbness but partial. He has not noted extension into the legs. He thinks he may of some mild hand involvement. He has remote history of Guillain-Barre' syndrome-but no recent weakness.  Denies any back pain. He may have some very mild hand involvement. He has noticed some increased thirst but no polyuria. He does not have any personal history of diabetes but does have a brother with type 2 diabetes. He's had previous radiation seed implants for prostate cancer but no history of chemotherapy.  He has not noted any motor weakness of the lower extremities. Does drink alcohol but has never been a binge drinker. Does not drink daily. He does take chronic omeprazole.  Past Medical History:  Diagnosis Date  . Arthritis   . Asthma   . Cancer [facility])    prostate  . GERD (gastroesophageal reflux disease)   . Guillain-Barre syndrome ([place]) 2000  . Hyperlipidemia   . Hypertension    Past Surgical History:  Procedure Laterality Date  . EYE SURGERY  6252   75 years old  . HERNIA REPAIR  1995  . NOSE SURGERY  1990  . PROSTATE SURGERY  2000   seed inplants  . SHOULDER SURGERY  2008    reports that he has never smoked. He has never used smokeless tobacco. He reports that he does not use drugs. His alcohol history is not on file. family history includes Alcohol abuse in his father; Diabetes in his brother; Hyperlipidemia in his brother; Stroke in his brother. No Known Allergies   Review of Systems  Constitutional: Negative for fatigue.  Eyes: Negative for visual disturbance.  Respiratory: Negative for cough, chest tightness and shortness of breath.   Cardiovascular: Negative for chest pain, palpitations and leg swelling.  Endocrine: Positive for polydipsia. Negative for polyuria.  Neurological: Positive for numbness. Negative for  dizziness, syncope, weakness, light-headedness and headaches.       Objective:   Physical Exam  Constitutional: He is oriented to person, place, and time. He appears well-developed and well-nourished.  HENT:  Right Ear: External ear normal.  Left Ear: External ear normal.  Mouth/Throat: Oropharynx is clear and moist.  Eyes: Pupils are equal, round, and reactive to light.  Neck: Neck supple. No thyromegaly present.  Cardiovascular: Normal rate and regular rhythm.   Pulmonary/Chest: Effort normal and breath sounds normal. No respiratory distress. He has no wheezes. He has no rales.  Musculoskeletal: He exhibits no edema.  Neurological: He is alert and oriented to person, place, and time.  Full-strength lower extremities. He has slight impairment with monofilament testing left foot but intact right foot. Deep tendon reflexes are 1+ knee and ankle bilaterally       Assessment:     Bilateral sensory neuropathy involving both feet. Etiology unclear. Rule out B12 deficiency with chronic PPI use    Plan:     -Check labs with basic chemistries, CBC, B12 level, serum protein electrophoresis, hemoglobin A1c -Routine follow-up in 2 weeks to reassess  Drew Post MD [facility] at [facility]

## 2015-12-12 LAB — PROTEIN ELECTROPHORESIS, SERUM
ALPHA-1-GLOBULIN: 0.3 g/dL (ref 0.2–0.3)
ALPHA-2-GLOBULIN: 0.6 g/dL (ref 0.5–0.9)
Albumin ELP: 3.8 g/dL (ref 3.8–4.8)
BETA 2: 0.3 g/dL (ref 0.2–0.5)
BETA GLOBULIN: 0.4 g/dL (ref 0.4–0.6)
GAMMA GLOBULIN: 1 g/dL (ref 0.8–1.7)
Total Protein, Serum Electrophoresis: 6.4 g/dL (ref 6.1–8.1)

## 2015-12-13 ENCOUNTER — Telehealth: Payer: Self-pay | Admitting: Family Medicine

## 2015-12-13 DIAGNOSIS — M9901 Segmental and somatic dysfunction of cervical region: Secondary | ICD-10-CM | POA: Diagnosis not present

## 2015-12-13 DIAGNOSIS — M5136 Other intervertebral disc degeneration, lumbar region: Secondary | ICD-10-CM | POA: Diagnosis not present

## 2015-12-13 DIAGNOSIS — M9903 Segmental and somatic dysfunction of lumbar region: Secondary | ICD-10-CM | POA: Diagnosis not present

## 2015-12-13 DIAGNOSIS — M50322 Other cervical disc degeneration at C5-C6 level: Secondary | ICD-10-CM | POA: Diagnosis not present

## 2015-12-13 MED ORDER — TRIAMCINOLONE ACETONIDE 0.1 % EX CREA: 1.0000 "application " | TOPICAL_CREAM | Freq: Two times a day (BID) | CUTANEOUS | 1 refills | Status: DC

## 2015-12-13 NOTE — Telephone Encounter (Signed)
Pt states the Rx  desonide (DESOWEN) 0.05 % cream  that dr Elease Hashimoto prescribed is very expensive.  Pt states the pharmacist recommends Triamcinone 0.1% instead which is the alternate.  Pt would like to know if ok for this instead and could he have today?  [facility]   Pt has follow up appt in 2 weeks.

## 2015-12-13 NOTE — Telephone Encounter (Signed)
Alternate medication sent in for patient.

## 2015-12-14 DIAGNOSIS — M9903 Segmental and somatic dysfunction of lumbar region: Secondary | ICD-10-CM | POA: Diagnosis not present

## 2015-12-14 DIAGNOSIS — M50322 Other cervical disc degeneration at C5-C6 level: Secondary | ICD-10-CM | POA: Diagnosis not present

## 2015-12-14 DIAGNOSIS — M9901 Segmental and somatic dysfunction of cervical region: Secondary | ICD-10-CM | POA: Diagnosis not present

## 2015-12-14 DIAGNOSIS — M5136 Other intervertebral disc degeneration, lumbar region: Secondary | ICD-10-CM | POA: Diagnosis not present

## 2015-12-18 DIAGNOSIS — M9903 Segmental and somatic dysfunction of lumbar region: Secondary | ICD-10-CM | POA: Diagnosis not present

## 2015-12-18 DIAGNOSIS — M50322 Other cervical disc degeneration at C5-C6 level: Secondary | ICD-10-CM | POA: Diagnosis not present

## 2015-12-18 DIAGNOSIS — M9901 Segmental and somatic dysfunction of cervical region: Secondary | ICD-10-CM | POA: Diagnosis not present

## 2015-12-18 DIAGNOSIS — M5136 Other intervertebral disc degeneration, lumbar region: Secondary | ICD-10-CM | POA: Diagnosis not present

## 2015-12-21 DIAGNOSIS — M50322 Other cervical disc degeneration at C5-C6 level: Secondary | ICD-10-CM | POA: Diagnosis not present

## 2015-12-21 DIAGNOSIS — M9903 Segmental and somatic dysfunction of lumbar region: Secondary | ICD-10-CM | POA: Diagnosis not present

## 2015-12-21 DIAGNOSIS — M9901 Segmental and somatic dysfunction of cervical region: Secondary | ICD-10-CM | POA: Diagnosis not present

## 2015-12-21 DIAGNOSIS — M5136 Other intervertebral disc degeneration, lumbar region: Secondary | ICD-10-CM | POA: Diagnosis not present

## 2015-12-26 ENCOUNTER — Encounter: Payer: Self-pay | Admitting: Family Medicine

## 2015-12-26 ENCOUNTER — Ambulatory Visit (INDEPENDENT_AMBULATORY_CARE_PROVIDER_SITE_OTHER): Payer: Medicare HMO | Admitting: Family Medicine

## 2015-12-26 VITALS — BP 132/68 | HR 69 | Temp 98.2°F | Ht 67.0 in | Wt 153.0 lb

## 2015-12-26 DIAGNOSIS — G629 Polyneuropathy, unspecified: Secondary | ICD-10-CM | POA: Diagnosis not present

## 2015-12-26 NOTE — Patient Instructions (Signed)

## 2015-12-26 NOTE — Progress Notes (Signed)
Subjective:     Patient ID: Drew Smith, male   DOB: 07-08-1940, 75 y.o.   MRN: [id number]  HPI Patient seen for follow-up regarding bilateral foot numbness. Refer to previous note. We obtained several labs including hemoglobin A1c, serum protein not sure pheresis, basic chemistries, TSH, B12 level, CBC and these were all basically normal. 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 No recent falls. He does use alcohol fairly regularly but denies any binge drinking.  Past Medical History:  Diagnosis Date  . Arthritis   . Asthma   . Cancer [facility])    prostate  . GERD (gastroesophageal reflux disease)   . Guillain-Barre syndrome ([place]) 2000  . Hyperlipidemia   . Hypertension    Past Surgical History:  Procedure Laterality Date  . EYE SURGERY  8628   75 years old  . HERNIA REPAIR  1995  . NOSE SURGERY  1990  . PROSTATE SURGERY  2000   seed inplants  . SHOULDER SURGERY  2008    reports that he has never smoked. He has never used smokeless tobacco. He reports that he does not use drugs. His alcohol history is not on file. family history includes Alcohol abuse in his father; Diabetes in his brother; Hyperlipidemia in his brother; Stroke in his brother. No Known Allergies   Review of Systems  Respiratory: Negative for shortness of breath.   Cardiovascular: Negative for chest pain.  Neurological: Positive for numbness. Negative for dizziness, syncope, weakness and headaches.  Hematological: Negative for adenopathy.       Objective:   Physical Exam  Constitutional: He appears well-developed and well-nourished.  Cardiovascular: Normal rate and regular rhythm.   Pulmonary/Chest: Effort normal and breath sounds normal. No respiratory distress. He has no wheezes. He has no rales.  Musculoskeletal: He exhibits no edema.  Neurological:  Trace reflexes knees bilaterally and 1+ ankle bilaterally. He has full strength  lower extremities. Previously noted mild impairment with microfilament testing both feet       Assessment:     Bilateral symmetry neuropathy without evidence for motor weakness. Workup as above unrevealing. He's had symptoms for quite some time and no clear progression. Doubt recurrence of Guillain-Barr syndrome    Plan:     -Continue regular healthy diet and consider vitamin B 6 and B12 supplement -Avoid excessive use of alcohol -Follow-up promptly for any weakness or progressive symptoms  [name] MD [facility] at [facility]

## 2016-01-01 DIAGNOSIS — M50322 Other cervical disc degeneration at C5-C6 level: Secondary | ICD-10-CM | POA: Diagnosis not present

## 2016-01-01 DIAGNOSIS — M9901 Segmental and somatic dysfunction of cervical region: Secondary | ICD-10-CM | POA: Diagnosis not present

## 2016-01-01 DIAGNOSIS — M9903 Segmental and somatic dysfunction of lumbar region: Secondary | ICD-10-CM | POA: Diagnosis not present

## 2016-01-01 DIAGNOSIS — M5136 Other intervertebral disc degeneration, lumbar region: Secondary | ICD-10-CM | POA: Diagnosis not present

## 2016-01-22 DIAGNOSIS — M9903 Segmental and somatic dysfunction of lumbar region: Secondary | ICD-10-CM | POA: Diagnosis not present

## 2016-01-29 DIAGNOSIS — M5136 Other intervertebral disc degeneration, lumbar region: Secondary | ICD-10-CM | POA: Diagnosis not present

## 2016-01-29 DIAGNOSIS — M50322 Other cervical disc degeneration at C5-C6 level: Secondary | ICD-10-CM | POA: Diagnosis not present

## 2016-01-29 DIAGNOSIS — M9901 Segmental and somatic dysfunction of cervical region: Secondary | ICD-10-CM | POA: Diagnosis not present

## 2016-01-29 DIAGNOSIS — M9903 Segmental and somatic dysfunction of lumbar region: Secondary | ICD-10-CM | POA: Diagnosis not present

## 2016-02-06 ENCOUNTER — Encounter: Payer: Self-pay | Admitting: Family Medicine

## 2016-02-06 ENCOUNTER — Ambulatory Visit (INDEPENDENT_AMBULATORY_CARE_PROVIDER_SITE_OTHER): Payer: Medicare HMO | Admitting: Family Medicine

## 2016-02-06 VITALS — BP 130/70 | HR 69 | Temp 98.1°F | Ht 67.0 in | Wt 163.9 lb

## 2016-02-06 DIAGNOSIS — H9313 Tinnitus, bilateral: Secondary | ICD-10-CM

## 2016-02-06 DIAGNOSIS — H5789 Other specified disorders of eye and adnexa: Secondary | ICD-10-CM

## 2016-02-06 DIAGNOSIS — H578 Other specified disorders of eye and adnexa: Secondary | ICD-10-CM

## 2016-02-06 NOTE — Progress Notes (Signed)
Subjective:     Patient ID: Drew Smith, male   DOB: Jan 17, 1941, 75 y.o.   MRN: [id number]  HPI Patient seen for the following issues  6-8 week history of some irritation left eye. No blurred vision. He has some chronic loss of vision in the right eye. He is scheduled to see his ophthalmologist for routine check up in January. He has noticed some dryness of the eyes in general. He is not aware of any injury or foreign body. No eye pain. No matting or drainage. No contact use. Plays golf frequently. Wonders whether he might of gotten some in the eye  Bilateral tinnitus over the past few weeks. Denies any sudden hearing changes. No ear pain. No vertigo.  Past Medical History:  Diagnosis Date  . Arthritis   . Asthma   . Cancer [facility])    prostate  . GERD (gastroesophageal reflux disease)   . Guillain-Barre syndrome ([place]) 2000  . Hyperlipidemia   . Hypertension    Past Surgical History:  Procedure Laterality Date  . EYE SURGERY  2988   75 years old  . HERNIA REPAIR  1995  . NOSE SURGERY  1990  . PROSTATE SURGERY  2000   seed inplants  . SHOULDER SURGERY  2008    reports that he has never smoked. He has never used smokeless tobacco. He reports that he does not use drugs. His alcohol history is not on file. family history includes Alcohol abuse in his father; Diabetes in his brother; Hyperlipidemia in his brother; Stroke in his brother. No Known Allergies   Review of Systems  HENT: Positive for tinnitus. Negative for ear discharge, ear pain and hearing loss.   Eyes: Negative for photophobia, pain, discharge, redness and visual disturbance.       Objective:   Physical Exam  Constitutional: He is oriented to person, place, and time. He appears well-developed and well-nourished.  HENT:  Right Ear: External ear normal.  Left Ear: External ear normal.  Conjunctiva appears normal bilaterally. Pupils equal round reactive to light. Fundi benign. Fluoroscein staining of the left  eye-no foreign bodies and no ulceration. No corneal defects  Neurological: He is alert and oriented to person, place, and time. No cranial nerve deficit.       Assessment:     #1 left eye irritation. No evidence for foreign body or ulceration by fluroscein use..  #2 bilateral tinnitus-suspect related to age related sensorineural hearing loss.  No red flags such as sudden hearing loss, unilateral tinnitus, etc.    Plan:     -Follow-up with ophthalmologist as scheduled and sooner for any redness or eye pain or sudden vision changes -offered audiology or ENT assessment of tinnitus and at this point he wishes to observe.    [name] MD [facility] at [facility]

## 2016-02-06 NOTE — Patient Instructions (Signed)
Tinnitus  Tinnitus refers to hearing a sound when there is no actual source for that sound. This is often described as ringing in the ears. However, people with this condition may hear a variety of noises. A person may hear the sound in one ear or in both ears.  The sounds of tinnitus can be soft, loud, or somewhere in between. Tinnitus can last for a few seconds or can be constant for days. It may go away without treatment and come back at various times. When tinnitus is constant or happens often, it can lead to other problems, such as trouble sleeping and trouble concentrating.  Almost everyone experiences tinnitus at some point. Tinnitus that is long-lasting (chronic) or comes back often is a problem that may require medical attention.  What are the causes?  The cause of tinnitus is often not known. In some cases, it can result from other problems or conditions, including:  · Exposure to loud noises from machinery, music, or other sources.  · Hearing loss.  · Ear or sinus infections.  · Earwax buildup.  · A foreign object in the ear.  · Use of certain medicines.  · Use of alcohol and caffeine.  · High blood pressure.  · Heart diseases.  · Anemia.  · Allergies.  · Meniere disease.  · Thyroid problems.  · Tumors.  · An enlarged part of a weakened blood vessel (aneurysm).    What are the signs or symptoms?  The main symptom of tinnitus is hearing a sound when there is no source for that sound. It may sound like:  · Buzzing.  · Roaring.  · Ringing.  · Blowing air, similar to the sound heard when you listen to a seashell.  · Hissing.  · Whistling.  · Sizzling.  · Humming.  · Running water.  · A sustained musical note.    How is this diagnosed?  Tinnitus is diagnosed based on your symptoms. Your health care provider will do a physical exam. A comprehensive hearing exam (audiologic exam) will be done if your tinnitus:  · Affects only one ear (unilateral).  · Causes hearing difficulties.  · Lasts 6 months or  longer.    You may also need to see a health care provider who specializes in hearing disorders (audiologist). You may be asked to complete a questionnaire to determine the severity of your tinnitus. Tests may be done to help determine the cause and to rule out other conditions. These can include:  · Imaging studies of your head and brain, such as:  ? A CT scan.  ? An MRI.  · An imaging study of your blood vessels (angiogram).    How is this treated?  Treating an underlying medical condition can sometimes make tinnitus go away. If your tinnitus continues, other treatments may include:  · Medicines, such as certain antidepressants or sleeping aids.  · Sound generators to mask the tinnitus. These include:  ? Tabletop sound machines that play relaxing sounds to help you fall asleep.  ? Wearable devices that fit in your ear and play sounds or music.  ? A small device that uses headphones to deliver a signal embedded in music (acoustic neural stimulation). In time, this may change the pathways of your brain and make you less sensitive to tinnitus. This device is used for very severe cases when no other treatment is working.  · Therapy and counseling to help you manage the stress of living with tinnitus.  · Using   hearing aids or cochlear implants, if your tinnitus is related to hearing loss.    Follow these instructions at home:  · When possible, avoid being in loud places and being exposed to loud sounds.  · Wear hearing protection, such as earplugs, when you are exposed to loud noises.  · Do not take stimulants, such as nicotine, alcohol, or caffeine.  · Practice techniques for reducing stress, such as meditation, yoga, or deep breathing.  · Use a white noise machine, a humidifier, or other devices to mask the sound of tinnitus.  · Sleep with your head slightly raised. This may reduce the impact of tinnitus.  · Try to get plenty of rest each night.  Contact a health care provider if:  · You have tinnitus in just one  ear.  · Your tinnitus continues for 3 weeks or longer without stopping.  · Home care measures are not helping.  · You have tinnitus after a head injury.  · You have tinnitus along with any of the following:  ? Dizziness.  ? Loss of balance.  ? Nausea and vomiting.  This information is not intended to replace advice given to you by your health care provider. Make sure you discuss any questions you have with your health care provider.  Document Released: 02/11/2005 Document Revised: 10/15/2015 Document Reviewed: 07/14/2013  Elsevier Interactive Patient Education © 2017 [employer]vier Inc.

## 2016-02-06 NOTE — Progress Notes (Signed)
Pre visit review using our clinic review tool, if applicable. No additional management support is needed unless otherwise documented below in the visit note. 

## 2016-02-13 DIAGNOSIS — H01021 Squamous blepharitis right upper eyelid: Secondary | ICD-10-CM | POA: Diagnosis not present

## 2016-02-13 DIAGNOSIS — R69 Illness, unspecified: Secondary | ICD-10-CM | POA: Diagnosis not present

## 2016-02-13 DIAGNOSIS — H25013 Cortical age-related cataract, bilateral: Secondary | ICD-10-CM | POA: Diagnosis not present

## 2016-02-13 DIAGNOSIS — H2513 Age-related nuclear cataract, bilateral: Secondary | ICD-10-CM | POA: Diagnosis not present

## 2016-02-13 DIAGNOSIS — H01024 Squamous blepharitis left upper eyelid: Secondary | ICD-10-CM | POA: Diagnosis not present

## 2016-02-21 DIAGNOSIS — M5136 Other intervertebral disc degeneration, lumbar region: Secondary | ICD-10-CM | POA: Diagnosis not present

## 2016-02-21 DIAGNOSIS — M9903 Segmental and somatic dysfunction of lumbar region: Secondary | ICD-10-CM | POA: Diagnosis not present

## 2016-02-21 DIAGNOSIS — M50322 Other cervical disc degeneration at C5-C6 level: Secondary | ICD-10-CM | POA: Diagnosis not present

## 2016-02-21 DIAGNOSIS — M9901 Segmental and somatic dysfunction of cervical region: Secondary | ICD-10-CM | POA: Diagnosis not present

## 2016-02-22 ENCOUNTER — Encounter: Payer: Self-pay | Admitting: Adult Health

## 2016-02-22 ENCOUNTER — Ambulatory Visit (INDEPENDENT_AMBULATORY_CARE_PROVIDER_SITE_OTHER): Payer: Medicare HMO | Admitting: Adult Health

## 2016-02-22 VITALS — BP 134/70 | Temp 98.2°F | Ht 67.0 in | Wt 163.4 lb

## 2016-02-22 DIAGNOSIS — J069 Acute upper respiratory infection, unspecified: Secondary | ICD-10-CM | POA: Diagnosis not present

## 2016-02-22 MED ORDER — BENZONATATE 200 MG PO CAPS: 200.0000 mg | ORAL_CAPSULE | Freq: Two times a day (BID) | ORAL | 0 refills | Status: DC | PRN

## 2016-02-22 NOTE — Progress Notes (Signed)
Subjective:    Patient ID: Drew Smith, male    DOB: December 19, 1940, 75 y.o.   MRN: [id number]  URI   This is a new problem. The current episode started in the past 7 days. The problem has been gradually improving. There has been no fever. Associated symptoms include congestion, coughing (semi productive ), rhinorrhea and a sore throat ("scratchy"). Pertinent negatives include no diarrhea, ear pain, nausea, plugged ear sensation or sinus pain.     Review of Systems  Constitutional: Negative.   HENT: Positive for congestion, rhinorrhea and sore throat ("scratchy"). Negative for ear pain, sinus pain, sinus pressure, trouble swallowing and voice change.   Respiratory: Positive for cough (semi productive ).   Cardiovascular: Negative.   Gastrointestinal: Negative for diarrhea and nausea.  Neurological: Negative.    Past Medical History:  Diagnosis Date  . Arthritis   . Asthma   . Cancer [facility])    prostate  . GERD (gastroesophageal reflux disease)   . Guillain-Barre syndrome ([place]) 2000  . Hyperlipidemia   . Hypertension     Social History   Social History  . Marital status: Divorced    Spouse name: N/A  . Number of children: N/A  . Years of education: N/A   Occupational History  . Not on file.   Social History Main Topics  . Smoking status: Never Smoker  . Smokeless tobacco: Never Used  . Alcohol use Not on file  . Drug use: No  . Sexual activity: Not on file   Other Topics Concern  . Not on file   Social History Narrative  . No narrative on file    Past Surgical History:  Procedure Laterality Date  . EYE SURGERY  3347   75 years old  . HERNIA REPAIR  1995  . NOSE SURGERY  1990  . PROSTATE SURGERY  2000   seed inplants  . SHOULDER SURGERY  2008    Family History  Problem Relation Age of Onset  . Hyperlipidemia Brother   . Stroke Brother   . Diabetes Brother     type ll  . Alcohol abuse Father     No Known Allergies  Current Outpatient  Prescriptions on File Prior to Visit  Medication Sig Dispense Refill  . acyclovir (ZOVIRAX) 400 MG tablet TAKE 1 TABLET (400 MG TOTAL) BY MOUTH AS NEEDED. 90 tablet 2  . amLODipine (NORVASC) 5 MG tablet Take 1 tablet (5 mg total) by mouth daily. 90 tablet 3  . aspirin 81 MG tablet Take 81 mg by mouth as needed.     . diphenhydrAMINE-zinc acetate (BENADRYL) cream Apply 1 application topically daily as needed for itching.    . fluorouracil (EFUDEX) 5 % cream Apply topically as needed. Per Dr Joaquim Lai    . hydrochlorothiazide (MICROZIDE) 12.5 MG capsule Take 1 capsule (12.5 mg total) by mouth daily. 90 capsule 3  . ibuprofen (ADVIL,MOTRIN) 600 MG tablet Only use 800 mg  8 hours sparingly. 60 tablet 0  . LORazepam (ATIVAN) 0.5 MG tablet Take one tablet one hour prior to procedure 4 tablet 0  . omeprazole (PRILOSEC) 20 MG capsule TAKE 1 CAPSULE (20 MG TOTAL) BY MOUTH 2 (TWO) TIMES DAILY. 180 capsule 2  . sildenafil (REVATIO) 20 MG tablet Take 2-5 tablets one hour prior to sexual activity. 50 tablet 0  . triamcinolone cream (KENALOG) 0.1 % Apply 1 application topically 2 (two) times daily. 30 g 1   No current facility-administered medications on file prior  to visit.     BP 134/70   Temp 98.2 F (36.8 C) (Oral)   Ht 5\' 7"  (1.702 m)   Wt 163 lb 6.4 oz (74.1 kg)   BMI 25.59 kg/m       Objective:   Physical Exam  Constitutional: He is oriented to person, place, and time. He appears well-developed and well-nourished. No distress.  HENT:  Head: Normocephalic and atraumatic.  Right Ear: External ear normal.  Nose: Nose normal. No mucosal edema or rhinorrhea.  Mouth/Throat: Uvula is midline and mucous membranes are normal. Oropharyngeal exudate present.  Eyes: Conjunctivae and EOM are normal. Pupils are equal, round, and reactive to light. Left eye exhibits no discharge. No scleral icterus.  Neck: Normal range of motion. Neck supple.  Cardiovascular: Normal rate, regular rhythm, normal heart  sounds and intact distal pulses.  Exam reveals no gallop and no friction rub.   No murmur heard. Pulmonary/Chest: Effort normal and breath sounds normal. No respiratory distress. He has no wheezes. He has no rales. He exhibits no tenderness.  Lymphadenopathy:    He has no cervical adenopathy.  Neurological: He is alert and oriented to person, place, and time.  Skin: Skin is warm and dry. No rash noted. He is not diaphoretic. No erythema. No pallor.  Psychiatric: He has a normal mood and affect. His behavior is normal. Judgment and thought content normal.  Nursing note and vitals reviewed.     Assessment & Plan:  1. Upper respiratory tract infection, unspecified type - Appears to be viral. His symptoms have been improving. Will prescribe Tessalon pearls. Also advised Flonase to help with PND - benzonatate (TESSALON) 200 MG capsule; Take 1 capsule (200 mg total) by mouth 2 (two) times daily as needed for cough.  Dispense: 20 capsule; Refill: 0 - Follow up with PCP as needed  Dorothyann Peng, NP

## 2016-03-05 DIAGNOSIS — M5136 Other intervertebral disc degeneration, lumbar region: Secondary | ICD-10-CM | POA: Diagnosis not present

## 2016-03-05 DIAGNOSIS — M50322 Other cervical disc degeneration at C5-C6 level: Secondary | ICD-10-CM | POA: Diagnosis not present

## 2016-03-05 DIAGNOSIS — M9901 Segmental and somatic dysfunction of cervical region: Secondary | ICD-10-CM | POA: Diagnosis not present

## 2016-03-05 DIAGNOSIS — M9903 Segmental and somatic dysfunction of lumbar region: Secondary | ICD-10-CM | POA: Diagnosis not present

## 2016-03-12 ENCOUNTER — Other Ambulatory Visit (INDEPENDENT_AMBULATORY_CARE_PROVIDER_SITE_OTHER): Payer: Medicare HMO

## 2016-03-12 DIAGNOSIS — Z0000 Encounter for general adult medical examination without abnormal findings: Secondary | ICD-10-CM

## 2016-03-12 DIAGNOSIS — Z125 Encounter for screening for malignant neoplasm of prostate: Secondary | ICD-10-CM

## 2016-03-12 LAB — CBC WITH DIFFERENTIAL/PLATELET
BASOS ABS: 0 10*3/uL (ref 0.0–0.1)
BASOS PCT: 0.2 % (ref 0.0–3.0)
Eosinophils Absolute: 0.4 10*3/uL (ref 0.0–0.7)
Eosinophils Relative: 6.4 % — ABNORMAL HIGH (ref 0.0–5.0)
HEMATOCRIT: 42.1 % (ref 39.0–52.0)
Hemoglobin: 14.5 g/dL (ref 13.0–17.0)
LYMPHS ABS: 1.6 10*3/uL (ref 0.7–4.0)
Lymphocytes Relative: 23.6 % (ref 12.0–46.0)
MCHC: 34.4 g/dL (ref 30.0–36.0)
MCV: 92.1 fl (ref 78.0–100.0)
MONOS PCT: 10.2 % (ref 3.0–12.0)
Monocytes Absolute: 0.7 10*3/uL (ref 0.1–1.0)
NEUTROS ABS: 4 10*3/uL (ref 1.4–7.7)
NEUTROS PCT: 59.6 % (ref 43.0–77.0)
PLATELETS: 218 10*3/uL (ref 150.0–400.0)
RBC: 4.57 Mil/uL (ref 4.22–5.81)
RDW: 13.2 % (ref 11.5–15.5)
WBC: 6.7 10*3/uL (ref 4.0–10.5)

## 2016-03-12 LAB — HEPATIC FUNCTION PANEL
ALBUMIN: 3.8 g/dL (ref 3.5–5.2)
ALK PHOS: 59 U/L (ref 39–117)
ALT: 13 U/L (ref 0–53)
AST: 15 U/L (ref 0–37)
Bilirubin, Direct: 0.1 mg/dL (ref 0.0–0.3)
TOTAL PROTEIN: 6.6 g/dL (ref 6.0–8.3)
Total Bilirubin: 0.7 mg/dL (ref 0.2–1.2)

## 2016-03-12 LAB — LIPID PANEL
Cholesterol: 165 mg/dL (ref 0–200)
HDL: 46.6 mg/dL (ref >39.00)
LDL Cholesterol: 100 mg/dL — ABNORMAL HIGH (ref 0–99)
NONHDL: 118.37
Total CHOL/HDL Ratio: 4
Triglycerides: 90 mg/dL (ref 0.0–149.0)
VLDL: 18 mg/dL (ref 0.0–40.0)

## 2016-03-12 LAB — BASIC METABOLIC PANEL
BUN: 15 mg/dL (ref 6–23)
CALCIUM: 9.2 mg/dL (ref 8.4–10.5)
CHLORIDE: 101 meq/L (ref 96–112)
CO2: 33 meq/L — AB (ref 19–32)
CREATININE: 0.93 mg/dL (ref 0.40–1.50)
GFR: 83.84 mL/min (ref >60.00)
GLUCOSE: 102 mg/dL — AB (ref 70–99)
Potassium: 4 mEq/L (ref 3.5–5.1)
Sodium: 138 mEq/L (ref 135–145)

## 2016-03-12 LAB — PSA: PSA: 0.03 ng/mL — AB (ref 0.10–4.00)

## 2016-03-12 LAB — TSH: TSH: 3.31 u[IU]/mL (ref 0.35–4.50)

## 2016-03-15 DIAGNOSIS — H01024 Squamous blepharitis left upper eyelid: Secondary | ICD-10-CM | POA: Diagnosis not present

## 2016-03-15 DIAGNOSIS — H01021 Squamous blepharitis right upper eyelid: Secondary | ICD-10-CM | POA: Diagnosis not present

## 2016-03-19 ENCOUNTER — Encounter: Payer: Self-pay | Admitting: Family Medicine

## 2016-03-19 ENCOUNTER — Ambulatory Visit (INDEPENDENT_AMBULATORY_CARE_PROVIDER_SITE_OTHER): Payer: Medicare HMO | Admitting: Family Medicine

## 2016-03-19 VITALS — BP 118/70 | HR 62 | Ht 67.0 in | Wt 163.0 lb

## 2016-03-19 DIAGNOSIS — L570 Actinic keratosis: Secondary | ICD-10-CM | POA: Diagnosis not present

## 2016-03-19 DIAGNOSIS — Z0000 Encounter for general adult medical examination without abnormal findings: Secondary | ICD-10-CM | POA: Diagnosis not present

## 2016-03-19 MED ORDER — AZELASTINE HCL 0.1 % NA SOLN: 1.0000 | Freq: Two times a day (BID) | NASAL | 12 refills | Status: DC

## 2016-03-19 NOTE — Patient Instructions (Signed)
We will put in order for Cologuard for colon cancer screening.

## 2016-03-19 NOTE — Progress Notes (Signed)
Subjective:     Patient ID: Drew Smith, male   DOB: August 15, 1940, 76 y.o.   MRN: [id number]  HPI Patient here for physical exam.  Chronic problems include history of squamous cell cancer of skin remotely, remote history of Guillain Barre' syndrome, hypertension, dyslipidemia, history of prostate cancer, GERD. He's had some chronic dry cough.  Nonsmoker. He feels this is postnasal drip related. He just purchases some Flonase but has not yet started. Denies any chronic sinusitis symptoms. Appetite and weight are stable. No dyspnea. No hemoptysis.  He has not had colon cancer screening through colonoscopy. He is not sure he wishes to pursue colonoscopy but is interested in considering DNA base stool testing.  He has a couple scaly areas -left ear and forehead and requests treatment with liquid nitrogen. He plays golf and is outdoors frequently. Generally uses sunblock.  Past Medical History:  Diagnosis Date  . Arthritis   . Asthma   . Cancer [facility])    prostate  . GERD (gastroesophageal reflux disease)   . Guillain-Barre syndrome ([place]) 2000  . Hyperlipidemia   . Hypertension    Past Surgical History:  Procedure Laterality Date  . EYE SURGERY  7572   76 years old  . HERNIA REPAIR  1995  . NOSE SURGERY  1990  . PROSTATE SURGERY  2000   seed inplants  . SHOULDER SURGERY  2008    reports that he has never smoked. He has never used smokeless tobacco. He reports that he does not use drugs. His alcohol history is not on file. family history includes Alcohol abuse in his father; Diabetes in his brother; Hyperlipidemia in his brother; Stroke in his brother. No Known Allergies   Review of Systems  Constitutional: Negative for activity change, appetite change, fatigue and fever.  HENT: Negative for congestion, ear pain and trouble swallowing.   Eyes: Negative for pain and visual disturbance.  Respiratory: Negative for cough, shortness of breath and wheezing.   Cardiovascular: Negative  for chest pain and palpitations.  Gastrointestinal: Negative for abdominal distention, abdominal pain, blood in stool, constipation, diarrhea, nausea, rectal pain and vomiting.  Genitourinary: Negative for dysuria, hematuria and testicular pain.  Musculoskeletal: Negative for joint swelling.  Neurological: Negative for dizziness, syncope and headaches.  Hematological: Negative for adenopathy.  Psychiatric/Behavioral: Negative for confusion and dysphoric mood.       Objective:   Physical Exam  Constitutional: He is oriented to person, place, and time. He appears well-developed and well-nourished. No distress.  HENT:  Head: Normocephalic and atraumatic.  Right Ear: External ear normal.  Left Ear: External ear normal.  Mouth/Throat: Oropharynx is clear and moist.  Eyes: Conjunctivae and EOM are normal. Pupils are equal, round, and reactive to light.  Neck: Normal range of motion. Neck supple. No thyromegaly present.  Cardiovascular: Normal rate, regular rhythm and normal heart sounds.   No murmur heard. Pulmonary/Chest: No respiratory distress. He has no wheezes. He has no rales.  Abdominal: Soft. Bowel sounds are normal. He exhibits no distension and no mass. There is no tenderness. There is no rebound and no guarding.  Musculoskeletal: He exhibits no edema.  Lymphadenopathy:    He has no cervical adenopathy.  Neurological: He is alert and oriented to person, place, and time. He displays normal reflexes. No cranial nerve deficit.  Skin:  He has multiple benign appearing seborrheic keratoses on his face and neck and trunk.  Dorsum left ear whitish hyperkeratotic lesion about 4 x 6 mm consistent with  actinic keratosis. No ulceration.  Psychiatric: He has a normal mood and affect.       Assessment:     #1 physical exam. Patient cannot get flu vaccine or shingles vaccine secondary history of Guiillain-Barre' syndrome.  We discussed colon cancer screening. He declines colonoscopy but  does agree to DNA base stool testing with Cologuard  #2 chronic cough probably related to postnasal drip from allergies  #3 actinic keratosis left ear    Plan:     -We initiated order for Cologuard -Try over-the-counter Flonase and may add Astelin necessary for postnasal drip symptoms -Continue Prilosec for GERD symptoms -Touch base if cough not resolving in 3-4 weeks -Labs reviewed with no major concerns -discussed risks and benefits of liquid nitrogen for left ear actinic keratosis. -applied liquid  Nitrogen to dorsal left ear actinic keratosis and pt tolerated well.  [name] MD [facility] at [facility]

## 2016-03-19 NOTE — Progress Notes (Signed)
Pre visit review using our clinic review tool, if applicable. No additional management support is needed unless otherwise documented below in the visit note. 

## 2016-03-22 DIAGNOSIS — G610 Guillain-Barre syndrome: Secondary | ICD-10-CM | POA: Diagnosis not present

## 2016-03-22 DIAGNOSIS — Z6825 Body mass index (BMI) 25.0-25.9, adult: Secondary | ICD-10-CM | POA: Diagnosis not present

## 2016-03-22 DIAGNOSIS — R233 Spontaneous ecchymoses: Secondary | ICD-10-CM | POA: Diagnosis not present

## 2016-03-22 DIAGNOSIS — H9313 Tinnitus, bilateral: Secondary | ICD-10-CM | POA: Diagnosis not present

## 2016-03-22 DIAGNOSIS — Z0000 Encounter for general adult medical examination without abnormal findings: Secondary | ICD-10-CM | POA: Diagnosis not present

## 2016-03-22 DIAGNOSIS — Z8546 Personal history of malignant neoplasm of prostate: Secondary | ICD-10-CM | POA: Diagnosis not present

## 2016-03-22 DIAGNOSIS — L299 Pruritus, unspecified: Secondary | ICD-10-CM | POA: Diagnosis not present

## 2016-03-22 DIAGNOSIS — Z972 Presence of dental prosthetic device (complete) (partial): Secondary | ICD-10-CM | POA: Diagnosis not present

## 2016-03-22 DIAGNOSIS — H259 Unspecified age-related cataract: Secondary | ICD-10-CM | POA: Diagnosis not present

## 2016-03-22 DIAGNOSIS — K219 Gastro-esophageal reflux disease without esophagitis: Secondary | ICD-10-CM | POA: Diagnosis not present

## 2016-03-22 DIAGNOSIS — Z923 Personal history of irradiation: Secondary | ICD-10-CM | POA: Diagnosis not present

## 2016-03-22 DIAGNOSIS — I10 Essential (primary) hypertension: Secondary | ICD-10-CM | POA: Diagnosis not present

## 2016-03-22 DIAGNOSIS — M159 Polyosteoarthritis, unspecified: Secondary | ICD-10-CM | POA: Diagnosis not present

## 2016-03-22 DIAGNOSIS — Z791 Long term (current) use of non-steroidal anti-inflammatories (NSAID): Secondary | ICD-10-CM | POA: Diagnosis not present

## 2016-03-22 DIAGNOSIS — R202 Paresthesia of skin: Secondary | ICD-10-CM | POA: Diagnosis not present

## 2016-03-22 DIAGNOSIS — E663 Overweight: Secondary | ICD-10-CM | POA: Diagnosis not present

## 2016-03-22 DIAGNOSIS — Z7982 Long term (current) use of aspirin: Secondary | ICD-10-CM | POA: Diagnosis not present

## 2016-03-22 DIAGNOSIS — B009 Herpesviral infection, unspecified: Secondary | ICD-10-CM | POA: Diagnosis not present

## 2016-03-27 DIAGNOSIS — Z1211 Encounter for screening for malignant neoplasm of colon: Secondary | ICD-10-CM | POA: Diagnosis not present

## 2016-03-27 DIAGNOSIS — Z1212 Encounter for screening for malignant neoplasm of rectum: Secondary | ICD-10-CM | POA: Diagnosis not present

## 2016-03-28 LAB — COLOGUARD: COLOGUARD: NEGATIVE

## 2016-04-01 ENCOUNTER — Encounter: Payer: Self-pay | Admitting: Family Medicine

## 2016-04-01 ENCOUNTER — Ambulatory Visit (INDEPENDENT_AMBULATORY_CARE_PROVIDER_SITE_OTHER): Payer: Medicare HMO | Admitting: Family Medicine

## 2016-04-01 VITALS — BP 140/80 | HR 66 | Temp 98.0°F | Ht 67.0 in | Wt 167.0 lb

## 2016-04-01 DIAGNOSIS — M9901 Segmental and somatic dysfunction of cervical region: Secondary | ICD-10-CM | POA: Diagnosis not present

## 2016-04-01 DIAGNOSIS — H9313 Tinnitus, bilateral: Secondary | ICD-10-CM | POA: Diagnosis not present

## 2016-04-01 DIAGNOSIS — M50322 Other cervical disc degeneration at C5-C6 level: Secondary | ICD-10-CM | POA: Diagnosis not present

## 2016-04-01 DIAGNOSIS — M5136 Other intervertebral disc degeneration, lumbar region: Secondary | ICD-10-CM | POA: Diagnosis not present

## 2016-04-01 DIAGNOSIS — M9903 Segmental and somatic dysfunction of lumbar region: Secondary | ICD-10-CM | POA: Diagnosis not present

## 2016-04-01 NOTE — Patient Instructions (Signed)
Tinnitus  Tinnitus refers to hearing a sound when there is no actual source for that sound. This is often described as ringing in the ears. However, people with this condition may hear a variety of noises. A person may hear the sound in one ear or in both ears.  The sounds of tinnitus can be soft, loud, or somewhere in between. Tinnitus can last for a few seconds or can be constant for days. It may go away without treatment and come back at various times. When tinnitus is constant or happens often, it can lead to other problems, such as trouble sleeping and trouble concentrating.  Almost everyone experiences tinnitus at some point. Tinnitus that is long-lasting (chronic) or comes back often is a problem that may require medical attention.  What are the causes?  The cause of tinnitus is often not known. In some cases, it can result from other problems or conditions, including:  · Exposure to loud noises from machinery, music, or other sources.  · Hearing loss.  · Ear or sinus infections.  · Earwax buildup.  · A foreign object in the ear.  · Use of certain medicines.  · Use of alcohol and caffeine.  · High blood pressure.  · Heart diseases.  · Anemia.  · Allergies.  · Meniere disease.  · Thyroid problems.  · Tumors.  · An enlarged part of a weakened blood vessel (aneurysm).    What are the signs or symptoms?  The main symptom of tinnitus is hearing a sound when there is no source for that sound. It may sound like:  · Buzzing.  · Roaring.  · Ringing.  · Blowing air, similar to the sound heard when you listen to a seashell.  · Hissing.  · Whistling.  · Sizzling.  · Humming.  · Running water.  · A sustained musical note.    How is this diagnosed?  Tinnitus is diagnosed based on your symptoms. Your health care provider will do a physical exam. A comprehensive hearing exam (audiologic exam) will be done if your tinnitus:  · Affects only one ear (unilateral).  · Causes hearing difficulties.  · Lasts 6 months or  longer.    You may also need to see a health care provider who specializes in hearing disorders (audiologist). You may be asked to complete a questionnaire to determine the severity of your tinnitus. Tests may be done to help determine the cause and to rule out other conditions. These can include:  · Imaging studies of your head and brain, such as:  ? A CT scan.  ? An MRI.  · An imaging study of your blood vessels (angiogram).    How is this treated?  Treating an underlying medical condition can sometimes make tinnitus go away. If your tinnitus continues, other treatments may include:  · Medicines, such as certain antidepressants or sleeping aids.  · Sound generators to mask the tinnitus. These include:  ? Tabletop sound machines that play relaxing sounds to help you fall asleep.  ? Wearable devices that fit in your ear and play sounds or music.  ? A small device that uses headphones to deliver a signal embedded in music (acoustic neural stimulation). In time, this may change the pathways of your brain and make you less sensitive to tinnitus. This device is used for very severe cases when no other treatment is working.  · Therapy and counseling to help you manage the stress of living with tinnitus.  · Using   hearing aids or cochlear implants, if your tinnitus is related to hearing loss.    Follow these instructions at home:  · When possible, avoid being in loud places and being exposed to loud sounds.  · Wear hearing protection, such as earplugs, when you are exposed to loud noises.  · Do not take stimulants, such as nicotine, alcohol, or caffeine.  · Practice techniques for reducing stress, such as meditation, yoga, or deep breathing.  · Use a white noise machine, a humidifier, or other devices to mask the sound of tinnitus.  · Sleep with your head slightly raised. This may reduce the impact of tinnitus.  · Try to get plenty of rest each night.  Contact a health care provider if:  · You have tinnitus in just one  ear.  · Your tinnitus continues for 3 weeks or longer without stopping.  · Home care measures are not helping.  · You have tinnitus after a head injury.  · You have tinnitus along with any of the following:  ? Dizziness.  ? Loss of balance.  ? Nausea and vomiting.  This information is not intended to replace advice given to you by your health care provider. Make sure you discuss any questions you have with your health care provider.  Document Released: 02/11/2005 Document Revised: 10/15/2015 Document Reviewed: 07/14/2013  Elsevier Interactive Patient Education © 2017 [employer]vier Inc.

## 2016-04-01 NOTE — Progress Notes (Signed)
Subjective:     Patient ID: Drew Smith, male   DOB: 12/25/1940, 76 y.o.   MRN: [id number]  HPI Patient's seen with bilateral tinnitus over the past several weeks. He is not aware of any hearing loss. No pulsatile quality. He is occasionally around loud noises, but is not aware of any triggers. He denies any vertigo. He does take calcium channel blocker but is not on any other recent medications that would likely be triggering. Recent thyroid functions and other labs were normal. Symptoms are relatively mild.  Past Medical History:  Diagnosis Date  . Arthritis   . Asthma   . Cancer [facility])    prostate  . GERD (gastroesophageal reflux disease)   . Guillain-Barre syndrome ([place]) 2000  . Hyperlipidemia   . Hypertension    Past Surgical History:  Procedure Laterality Date  . EYE SURGERY  3950   76 years old  . HERNIA REPAIR  1995  . NOSE SURGERY  1990  . PROSTATE SURGERY  2000   seed inplants  . SHOULDER SURGERY  2008    reports that he has never smoked. He has never used smokeless tobacco. He reports that he does not use drugs. His alcohol history is not on file. family history includes Alcohol abuse in his father; Diabetes in his brother; Hyperlipidemia in his brother; Stroke in his brother. No Known Allergies   Review of Systems  Constitutional: Negative for chills and fever.  HENT: Negative for ear discharge, ear pain and hearing loss.   Respiratory: Negative for shortness of breath.   Cardiovascular: Negative for chest pain.  Neurological: Negative for dizziness and headaches.       Objective:   Physical Exam  Constitutional: He is oriented to person, place, and time. He appears well-developed and well-nourished.  HENT:  Right Ear: External ear normal.  Left Ear: External ear normal.  Mouth/Throat: Oropharynx is clear and moist.  Neck: Neck supple.  no carotid bruits  Cardiovascular: Normal rate and regular rhythm.   Pulmonary/Chest: Effort normal and breath  sounds normal. No respiratory distress. He has no wheezes. He has no rales.  Neurological: He is alert and oriented to person, place, and time. No cranial nerve deficit. Coordination normal.       Assessment:     Bilateral tinnitus. He does not have any red flags such as pulsatile tinnitus or any sudden hearing loss. We explained there are many causes potentially-frequently sensorineural.      Plan:     -We discussed possible referral to audiologist for further testing -Pt wishes to try over-the-counter lipo flavenoids even though we explained where not aware of any concrete evidence they work -Follow-up immediately for any sudden hearing loss or new symptoms  [name] MD [facility] at [facility]

## 2016-04-01 NOTE — Progress Notes (Signed)
Pre visit review using our clinic review tool, if applicable. No additional management support is needed unless otherwise documented below in the visit note. 

## 2016-04-09 ENCOUNTER — Encounter: Payer: Self-pay | Admitting: Family Medicine

## 2016-04-23 DIAGNOSIS — H01021 Squamous blepharitis right upper eyelid: Secondary | ICD-10-CM | POA: Diagnosis not present

## 2016-04-23 DIAGNOSIS — H01024 Squamous blepharitis left upper eyelid: Secondary | ICD-10-CM | POA: Diagnosis not present

## 2016-04-25 ENCOUNTER — Other Ambulatory Visit: Payer: Self-pay | Admitting: Family Medicine

## 2016-04-29 ENCOUNTER — Telehealth: Payer: Self-pay

## 2016-04-29 NOTE — Telephone Encounter (Signed)
PA approved, form faxed back to pharmacy. 

## 2016-04-29 NOTE — Telephone Encounter (Signed)
Received PA request from pharmacy for Omeprazole 20 mg capsules. PA submitted & is pending. Key: [id number]

## 2016-05-02 DIAGNOSIS — M5136 Other intervertebral disc degeneration, lumbar region: Secondary | ICD-10-CM | POA: Diagnosis not present

## 2016-05-02 DIAGNOSIS — M50322 Other cervical disc degeneration at C5-C6 level: Secondary | ICD-10-CM | POA: Diagnosis not present

## 2016-05-02 DIAGNOSIS — M9903 Segmental and somatic dysfunction of lumbar region: Secondary | ICD-10-CM | POA: Diagnosis not present

## 2016-05-02 DIAGNOSIS — M9901 Segmental and somatic dysfunction of cervical region: Secondary | ICD-10-CM | POA: Diagnosis not present

## 2016-05-09 DIAGNOSIS — H5310 Unspecified subjective visual disturbances: Secondary | ICD-10-CM | POA: Diagnosis not present

## 2016-06-06 DIAGNOSIS — R69 Illness, unspecified: Secondary | ICD-10-CM | POA: Diagnosis not present

## 2016-06-10 DIAGNOSIS — R69 Illness, unspecified: Secondary | ICD-10-CM | POA: Diagnosis not present

## 2016-06-25 DIAGNOSIS — 419620001 Death: Secondary | SNOMED CT | POA: Diagnosis not present

## 2016-06-25 DEATH — deceased

## 2016-07-10 DIAGNOSIS — M9903 Segmental and somatic dysfunction of lumbar region: Secondary | ICD-10-CM | POA: Diagnosis not present

## 2016-07-10 DIAGNOSIS — M9904 Segmental and somatic dysfunction of sacral region: Secondary | ICD-10-CM | POA: Diagnosis not present

## 2016-07-10 DIAGNOSIS — M5136 Other intervertebral disc degeneration, lumbar region: Secondary | ICD-10-CM | POA: Diagnosis not present

## 2016-07-10 DIAGNOSIS — M9905 Segmental and somatic dysfunction of pelvic region: Secondary | ICD-10-CM | POA: Diagnosis not present

## 2016-07-14 DIAGNOSIS — Z0100 Encounter for examination of eyes and vision without abnormal findings: Secondary | ICD-10-CM | POA: Diagnosis not present

## 2016-07-15 DIAGNOSIS — M9903 Segmental and somatic dysfunction of lumbar region: Secondary | ICD-10-CM | POA: Diagnosis not present

## 2016-07-15 DIAGNOSIS — M5136 Other intervertebral disc degeneration, lumbar region: Secondary | ICD-10-CM | POA: Diagnosis not present

## 2016-07-15 DIAGNOSIS — M9904 Segmental and somatic dysfunction of sacral region: Secondary | ICD-10-CM | POA: Diagnosis not present

## 2016-07-15 DIAGNOSIS — M9905 Segmental and somatic dysfunction of pelvic region: Secondary | ICD-10-CM | POA: Diagnosis not present

## 2016-07-24 DIAGNOSIS — M9905 Segmental and somatic dysfunction of pelvic region: Secondary | ICD-10-CM | POA: Diagnosis not present

## 2016-07-24 DIAGNOSIS — M9904 Segmental and somatic dysfunction of sacral region: Secondary | ICD-10-CM | POA: Diagnosis not present

## 2016-07-24 DIAGNOSIS — M9903 Segmental and somatic dysfunction of lumbar region: Secondary | ICD-10-CM | POA: Diagnosis not present

## 2016-07-24 DIAGNOSIS — M5136 Other intervertebral disc degeneration, lumbar region: Secondary | ICD-10-CM | POA: Diagnosis not present

## 2016-07-29 DIAGNOSIS — H01024 Squamous blepharitis left upper eyelid: Secondary | ICD-10-CM | POA: Diagnosis not present

## 2016-07-29 DIAGNOSIS — H01021 Squamous blepharitis right upper eyelid: Secondary | ICD-10-CM | POA: Diagnosis not present

## 2016-08-04 ENCOUNTER — Other Ambulatory Visit: Payer: Self-pay | Admitting: Family Medicine

## 2016-08-06 ENCOUNTER — Encounter: Payer: Self-pay | Admitting: Family Medicine

## 2016-08-06 ENCOUNTER — Ambulatory Visit (INDEPENDENT_AMBULATORY_CARE_PROVIDER_SITE_OTHER): Payer: Medicare HMO | Admitting: Family Medicine

## 2016-08-06 VITALS — BP 110/70 | HR 68 | Temp 99.6°F | Wt 160.7 lb

## 2016-08-06 DIAGNOSIS — R059 Cough, unspecified: Secondary | ICD-10-CM

## 2016-08-06 DIAGNOSIS — R05 Cough: Secondary | ICD-10-CM

## 2016-08-06 MED ORDER — AZITHROMYCIN 250 MG PO TABS: ORAL_TABLET | ORAL | 0 refills | Status: AC

## 2016-08-06 NOTE — Progress Notes (Signed)
Subjective:     Patient ID: Drew Smith, male   DOB: [birth year], 76 y.o.   MRN: [id number]  HPI   Patient seen with about one week history of cough. Mostly dry initially but now productive of thick yellow sputum. No fever. Increased malaise. No history of smoking. No chronic lung disease. No hemoptysis. Minimal nasal congestion.  Past Medical History:  Diagnosis Date  . Arthritis   . Asthma   . Cancer [facility])    prostate  . GERD (gastroesophageal reflux disease)   . Guillain-Barre syndrome ([place]) 2000  . Hyperlipidemia   . Hypertension    Past Surgical History:  Procedure Laterality Date  . EYE SURGERY  2031   76 years old  . HERNIA REPAIR  1995  . NOSE SURGERY  1990  . PROSTATE SURGERY  2000   seed inplants  . SHOULDER SURGERY  2008    reports that he has never smoked. He has never used smokeless tobacco. He reports that he does not use drugs. His alcohol history is not on file. family history includes Alcohol abuse in his father; Diabetes in his brother; Hyperlipidemia in his brother; Stroke in his brother. No Known Allergies   Review of Systems  Constitutional: Positive for fatigue. Negative for chills and fever.  Respiratory: Positive for cough. Negative for shortness of breath and wheezing.   Cardiovascular: Negative for chest pain.       Objective:   Physical Exam  Constitutional: He appears well-developed and well-nourished.  Cardiovascular: Normal rate and regular rhythm.   Pulmonary/Chest: Effort normal.  Few faint crackles right base which seemed to clear somewhat with coughing and deep breathing. No wheezing       Assessment:     Cough. Acute bronchitis most likely    Plan:     -We decided to go ahead and cover with Zithromax given the fact that he had temp of 99.6 with some mild asymmetric breath sounds. Consider chest x-ray if not promptly improving over the next couple days. -Also recommended over-the-counter plain Mucinex and increased  hydration  [name] MD [facility] at [facility]

## 2016-08-06 NOTE — Patient Instructions (Signed)
Follow up for any fever or increased shortness of breath. 

## 2016-08-24 ENCOUNTER — Other Ambulatory Visit: Payer: Self-pay | Admitting: Family Medicine

## 2016-09-09 ENCOUNTER — Other Ambulatory Visit: Payer: Self-pay | Admitting: Family Medicine

## 2016-10-08 DIAGNOSIS — R69 Illness, unspecified: Secondary | ICD-10-CM | POA: Diagnosis not present

## 2016-10-21 DIAGNOSIS — M9905 Segmental and somatic dysfunction of pelvic region: Secondary | ICD-10-CM | POA: Diagnosis not present

## 2016-10-21 DIAGNOSIS — M5136 Other intervertebral disc degeneration, lumbar region: Secondary | ICD-10-CM | POA: Diagnosis not present

## 2016-10-21 DIAGNOSIS — M9904 Segmental and somatic dysfunction of sacral region: Secondary | ICD-10-CM | POA: Diagnosis not present

## 2016-10-21 DIAGNOSIS — M9903 Segmental and somatic dysfunction of lumbar region: Secondary | ICD-10-CM | POA: Diagnosis not present

## 2016-10-22 ENCOUNTER — Other Ambulatory Visit: Payer: Self-pay | Admitting: Family Medicine

## 2016-11-30 ENCOUNTER — Other Ambulatory Visit: Payer: Self-pay | Admitting: Family Medicine

## 2017-02-06 ENCOUNTER — Other Ambulatory Visit: Payer: Self-pay | Admitting: Family Medicine

## 2017-02-06 DIAGNOSIS — R69 Illness, unspecified: Secondary | ICD-10-CM | POA: Diagnosis not present

## 2017-02-14 ENCOUNTER — Encounter: Payer: Self-pay | Admitting: Family Medicine

## 2017-02-14 ENCOUNTER — Ambulatory Visit (INDEPENDENT_AMBULATORY_CARE_PROVIDER_SITE_OTHER): Payer: Medicare HMO | Admitting: Family Medicine

## 2017-02-14 ENCOUNTER — Ambulatory Visit: Payer: Medicare HMO | Admitting: Family Medicine

## 2017-02-14 VITALS — BP 140/78 | HR 66 | Temp 98.6°F | Ht 67.0 in | Wt 176.0 lb

## 2017-02-14 DIAGNOSIS — J069 Acute upper respiratory infection, unspecified: Secondary | ICD-10-CM

## 2017-02-14 NOTE — Patient Instructions (Signed)
Please try things such as zyrtec-D or allegra-D which is an antihistamine and decongestant.   Please try afrin which will help with nasal congestion but use for only three days.   Please also try using a netti pot on a regular occasion.  Honey can help with a sore throat.   Delsym can help with cough

## 2017-02-14 NOTE — Assessment & Plan Note (Signed)
Symptoms likely viral  - counseled on supportive care

## 2017-02-14 NOTE — Progress Notes (Signed)
Drew Smith - 76 y.o. male MRN [id number]  Date of birth: 1940-11-06  SUBJECTIVE:  Including CC & ROS.  Chief Complaint  Patient presents with  . Sinus pressure    Drew Smith is a 76 y.o. male that is presenting with sinus pressure and cough. Symptoms have been ongoing for one week. Patient has not taken anything for his ongoing symptoms.  Denies body aches, chills or fevers. Denies any sick contacts. Feels like his symptoms are improving today. No recent travel.    Review of Systems  Constitutional: Negative for fever.  HENT: Positive for rhinorrhea and sinus pressure.   Respiratory: Negative for shortness of breath.   Cardiovascular: Negative for chest pain.    HISTORY: Past Medical, Surgical, Social, and Family History Reviewed & Updated per EMR.   Pertinent Historical Findings include:  Past Medical History:  Diagnosis Date  . Arthritis   . Asthma   . Cancer [facility])    prostate  . GERD (gastroesophageal reflux disease)   . Guillain-Barre syndrome ([place]) 2000  . Hyperlipidemia   . Hypertension     Past Surgical History:  Procedure Laterality Date  . EYE SURGERY  169   76 years old  . HERNIA REPAIR  1995  . NOSE SURGERY  1990  . PROSTATE SURGERY  2000   seed inplants  . SHOULDER SURGERY  2008    No Known Allergies  Family History  Problem Relation Age of Onset  . Hyperlipidemia Brother   . Stroke Brother   . Diabetes Brother        type ll  . Alcohol abuse Father      Social History   Socioeconomic History  . Marital status: Divorced    Spouse name: Not on file  . Number of children: Not on file  . Years of education: Not on file  . Highest education level: Not on file  Social Needs  . Financial resource strain: Not on file  . Food insecurity - worry: Not on file  . Food insecurity - inability: Not on file  . Transportation needs - medical: Not on file  . Transportation needs - non-medical: Not on file  Occupational History  . Not on  file  Tobacco Use  . Smoking status: Never Smoker  . Smokeless tobacco: Never Used  Substance and Sexual Activity  . Alcohol use: Not on file  . Drug use: No  . Sexual activity: Not on file  Other Topics Concern  . Not on file  Social History Narrative  . Not on file     PHYSICAL EXAM:  VS: BP 140/78 (BP Location: Left Arm, Patient Position: Sitting, Cuff Size: Normal)   Pulse 66   Temp 98.6 F (37 C) (Oral)   Ht 5\' 7"  (1.702 m)   Wt 176 lb (79.8 kg)   SpO2 97%   BMI 27.57 kg/m  Physical Exam Gen: NAD, alert, cooperative with exam, well-appearing ENT: normal lips, normal nasal mucosa, tympanic membranes clear and intact bilaterally, normal oropharynx, no cervical lymphadenopathy Eye: normal EOM, normal conjunctiva and lids CV:  no edema, +2 pedal pulses, regular rate and rhythm, S1-S2   Resp: no accessory muscle use, non-labored, clear to auscultation bilaterally, no crackles or wheezes GI: no masses or tenderness, no hernia  Skin: no rashes, no areas of induration  Neuro: normal tone, normal sensation to touch Psych:  normal insight, alert and oriented MSK: Normal gait, normal strength      ASSESSMENT & PLAN:  Upper respiratory tract infection Symptoms likely viral  - counseled on supportive care

## 2017-02-24 DIAGNOSIS — H25013 Cortical age-related cataract, bilateral: Secondary | ICD-10-CM | POA: Diagnosis not present

## 2017-02-24 DIAGNOSIS — H2513 Age-related nuclear cataract, bilateral: Secondary | ICD-10-CM | POA: Diagnosis not present

## 2017-02-24 DIAGNOSIS — H01024 Squamous blepharitis left upper eyelid: Secondary | ICD-10-CM | POA: Diagnosis not present

## 2017-02-24 DIAGNOSIS — H01021 Squamous blepharitis right upper eyelid: Secondary | ICD-10-CM | POA: Diagnosis not present

## 2017-02-26 ENCOUNTER — Other Ambulatory Visit: Payer: Self-pay | Admitting: Family Medicine

## 2017-03-03 DIAGNOSIS — M9905 Segmental and somatic dysfunction of pelvic region: Secondary | ICD-10-CM | POA: Diagnosis not present

## 2017-03-03 DIAGNOSIS — M9904 Segmental and somatic dysfunction of sacral region: Secondary | ICD-10-CM | POA: Diagnosis not present

## 2017-03-03 DIAGNOSIS — M5136 Other intervertebral disc degeneration, lumbar region: Secondary | ICD-10-CM | POA: Diagnosis not present

## 2017-03-03 DIAGNOSIS — M9903 Segmental and somatic dysfunction of lumbar region: Secondary | ICD-10-CM | POA: Diagnosis not present

## 2017-03-17 DIAGNOSIS — Z0101 Encounter for examination of eyes and vision with abnormal findings: Secondary | ICD-10-CM | POA: Diagnosis not present

## 2017-03-18 NOTE — Progress Notes (Addendum)
Subjective:   Drew Smith is a 77 y.o. male who presents for Medicare Annual/Subsequent preventive examination.  In to see Dr. Elease Hashimoto today (hx of Index; had bronchial asthma; taking antibiotic and inhaler   Diet - hdl 46 Weight is very good Does his own cooking    Exercise Plays golf in the summer Works around the [place] a lot   ETOH Never smoked   Has never had a colonoscopy  Does not do anything "extra" after having Gullian Barre  There are no preventive care reminders to display for this patient. PSA  02/2016 Declines colonoscopy   Cardiac Risk Factors include: advanced age (>87men, >14 women)     Objective:    Vitals: BP 140/80   Ht 5\' 8"  (1.727 m)   Wt 168 lb (76.2 kg)   BMI 25.54 kg/m   Body mass index is 25.54 kg/m.  Advanced Directives 03/19/2017  Does Patient Have a Medical Advance Directive? No    Tobacco Social History   Tobacco Use  Smoking Status Never Smoker  Smokeless Tobacco Never Used     Counseling given: Yes   Clinical Intake:    Past Medical History:  Diagnosis Date  . Arthritis   . Asthma   . Cancer [facility])    prostate  . GERD (gastroesophageal reflux disease)   . Guillain-Barre syndrome ([place]) 2000  . Hyperlipidemia   . Hypertension    Past Surgical History:  Procedure Laterality Date  . EYE SURGERY  6315   77 years old  . HERNIA REPAIR  1995  . NOSE SURGERY  1990  . PROSTATE SURGERY  2000   seed inplants  . SHOULDER SURGERY  2008   Family History  Problem Relation Age of Onset  . Hyperlipidemia Brother   . Stroke Brother   . Diabetes Brother        type ll  . Alcohol abuse Father    Social History   Socioeconomic History  . Marital status: Divorced    Spouse name: Not on file  . Number of children: Not on file  . Years of education: Not on file  . Highest education level: Not on file  Social Needs  . Financial resource strain: Not on file  . Food  insecurity - worry: Not on file  . Food insecurity - inability: Not on file  . Transportation needs - medical: Not on file  . Transportation needs - non-medical: Not on file  Occupational History  . Not on file  Tobacco Use  . Smoking status: Never Smoker  . Smokeless tobacco: Never Used  Substance and Sexual Activity  . Alcohol use: Not on file    Comment: ave one a day; wine or other   . Drug use: No  . Sexual activity: Not on file  Other Topics Concern  . Not on file  Social History Narrative  . Not on file    Outpatient Encounter Medications as of 03/19/2017  Medication Sig  . acyclovir (ZOVIRAX) 400 MG tablet TAKE 1 TABLET (400 MG TOTAL) BY MOUTH AS NEEDED.  [name] amLODipine (NORVASC) 5 MG tablet TAKE 1 TABLET (5 MG TOTAL) BY MOUTH DAILY.  [name] aspirin 81 MG tablet Take 81 mg by mouth as needed.   [name] azelastine (ASTELIN) 0.1 % nasal spray Place 1 spray into both nostrils 2 (two) times daily. Use in each nostril as directed  . diphenhydrAMINE-zinc acetate (BENADRYL) cream Apply 1 application topically  daily as needed for itching.  . fluorouracil (EFUDEX) 5 % cream Apply topically as needed. Per Dr Joaquim Lai  . hydrochlorothiazide (MICROZIDE) 12.5 MG capsule TAKE 1 CAPSULE (12.5 MG TOTAL) BY MOUTH DAILY.  [name] ibuprofen (ADVIL,MOTRIN) 600 MG tablet Only use 800 mg  8 hours sparingly. (Patient taking differently: Take 600 mg by mouth every 6 (six) hours as needed. Only use 800 mg  8 hours sparingly.)  . LORazepam (ATIVAN) 0.5 MG tablet Take one tablet one hour prior to procedure  . omeprazole (PRILOSEC) 20 MG capsule TAKE ONE CAPSULE BY MOUTH TWICE A DAY  . Oxymetazoline HCl (NASAL SPRAY) 0.05 % SOLN Place into the nose.  . sildenafil (REVATIO) 20 MG tablet Take 2-5 tablets one hour prior to sexual activity.  [name] triamcinolone cream (KENALOG) 0.1 % APPLY TO AFFECTED AREAS TWO TIMES A DAY   No facility-administered encounter medications on file as of 03/19/2017.     Activities of Daily  Living In your present state of health, do you have any difficulty performing the following activities: 03/19/2017  Hearing? N  Vision? N  Difficulty concentrating or making decisions? N  Walking or climbing stairs? N  Dressing or bathing? N  Doing errands, shopping? N  Preparing Food and eating ? N  Using the Toilet? N  In the past six months, have you accidently leaked urine? N  Do you have problems with loss of bowel control? N  Managing your Medications? N  Managing your Finances? N  Housekeeping or managing your Housekeeping? N  Some recent data might be hidden    Patient Care Team: [name], MD as PCP - General (Family Medicine)   Assessment:   This is a routine wellness examination for Drew Smith.  Exercise Activities and Dietary recommendations Current Exercise Habits: Home exercise routine, Time (Minutes): 60, Frequency (Times/Week): 3, Weekly Exercise (Minutes/Week): 180, Intensity: Moderate, Exercise limited by: Other - see comments  Goals    . Patient Stated     Continue to stay healthy and visit [place]        Fall Risk Fall Risk  03/19/2017 03/19/2017 06/27/2015 09/23/2013  Falls in the past year? No No No No   No falls; balance is good secondary to dancing   Depression Screen PHQ 2/9 Scores 03/19/2017 03/19/2017 06/27/2015 09/23/2013  PHQ - 2 Score 0 0 0 0    Cognitive Function   Ad8 score reviewed for issues:  Issues making decisions:  Less interest in hobbies / activities:  Repeats questions, stories (family complaining):  Trouble using ordinary gadgets (microwave, computer, phone):  Forgets the month or year:   Mismanaging finances:   Remembering appts:  Daily problems with thinking and/or memory: Ad8 score is=0    Immunization History  Administered Date(s) Administered  . Pneumococcal Polysaccharide-23 11/28/2010    Qualifies for Shingles Vaccine? Declines due to hx of Gullian Barre  Screening Tests Health Maintenance  Topic  Date Due  . INFLUENZA VACCINE  01/21/2023 (Originally 09/25/2016)  . TETANUS/TDAP  03/04/2040 (Originally 08[birth year]/1958)    Plan:      PCP Notes   Health Maintenance There are no preventive care reminders to display for this patient. In process of completing his Advanced Directives  Declines any testing that is not [place] due to hx of Gullian Barre Weight good   Abnormal Screens  No   Referrals  no  Patient concerns; No   Nurse Concerns; As noted   Next PCP apt Was seen today To have blood  drawn when leaving today     I have personally reviewed and noted the following in the patient's chart:   . Medical and social history . Use of alcohol, tobacco or illicit drugs  . Current medications and supplements . Functional ability and status . Nutritional status . Physical activity . Advanced directives . List of other physicians . Hospitalizations, surgeries, and ER visits in previous 12 months . Vitals . Screenings to include cognitive, depression, and falls . Referrals and appointments  In addition, I have reviewed and discussed with patient certain preventive protocols, quality metrics, and best practice recommendations. A written personalized care plan for preventive services as well as general preventive health recommendations were provided to patient.     ULAGT,XMIWO, RN  03/19/2017  Agree with assessment as above.  [name] MD [facility] at [facility]

## 2017-03-19 ENCOUNTER — Ambulatory Visit (INDEPENDENT_AMBULATORY_CARE_PROVIDER_SITE_OTHER): Payer: Medicare HMO | Admitting: Family Medicine

## 2017-03-19 ENCOUNTER — Ambulatory Visit (INDEPENDENT_AMBULATORY_CARE_PROVIDER_SITE_OTHER): Payer: Medicare HMO

## 2017-03-19 ENCOUNTER — Encounter: Payer: Self-pay | Admitting: Family Medicine

## 2017-03-19 VITALS — BP 140/80 | Ht 68.0 in | Wt 168.0 lb

## 2017-03-19 VITALS — BP 140/78 | HR 72 | Temp 98.2°F | Ht 68.0 in | Wt 168.5 lb

## 2017-03-19 DIAGNOSIS — Z0000 Encounter for general adult medical examination without abnormal findings: Secondary | ICD-10-CM | POA: Diagnosis not present

## 2017-03-19 DIAGNOSIS — F528 Other sexual dysfunction not due to a substance or known physiological condition: Secondary | ICD-10-CM | POA: Diagnosis not present

## 2017-03-19 DIAGNOSIS — Z8669 Personal history of other diseases of the nervous system and sense organs: Secondary | ICD-10-CM

## 2017-03-19 DIAGNOSIS — R69 Illness, unspecified: Secondary | ICD-10-CM | POA: Diagnosis not present

## 2017-03-19 DIAGNOSIS — L821 Other seborrheic keratosis: Secondary | ICD-10-CM | POA: Diagnosis not present

## 2017-03-19 DIAGNOSIS — K219 Gastro-esophageal reflux disease without esophagitis: Secondary | ICD-10-CM | POA: Diagnosis not present

## 2017-03-19 DIAGNOSIS — I10 Essential (primary) hypertension: Secondary | ICD-10-CM | POA: Diagnosis not present

## 2017-03-19 DIAGNOSIS — E785 Hyperlipidemia, unspecified: Secondary | ICD-10-CM

## 2017-03-19 LAB — BASIC METABOLIC PANEL
BUN: 16 mg/dL (ref 6–23)
CO2: 27 mEq/L (ref 19–32)
Calcium: 9.1 mg/dL (ref 8.4–10.5)
Chloride: 104 mEq/L (ref 96–112)
Creatinine, Ser: 0.88 mg/dL (ref 0.40–1.50)
GFR: 89.12 mL/min (ref >60.00)
GLUCOSE: 100 mg/dL — AB (ref 70–99)
POTASSIUM: 4.3 meq/L (ref 3.5–5.1)
SODIUM: 141 meq/L (ref 135–145)

## 2017-03-19 LAB — LIPID PANEL
Cholesterol: 144 mg/dL (ref 0–200)
HDL: 48.9 mg/dL (ref >39.00)
LDL Cholesterol: 83 mg/dL (ref 0–99)
NonHDL: 95.58
Total CHOL/HDL Ratio: 3
Triglycerides: 64 mg/dL (ref 0.0–149.0)
VLDL: 12.8 mg/dL (ref 0.0–40.0)

## 2017-03-19 LAB — HEPATIC FUNCTION PANEL
ALT: 10 U/L (ref 0–53)
AST: 14 U/L (ref 0–37)
Albumin: 4 g/dL (ref 3.5–5.2)
Alkaline Phosphatase: 55 U/L (ref 39–117)
Bilirubin, Direct: 0.1 mg/dL (ref 0.0–0.3)
Total Bilirubin: 0.6 mg/dL (ref 0.2–1.2)
Total Protein: 6.7 g/dL (ref 6.0–8.3)

## 2017-03-19 NOTE — Progress Notes (Addendum)
Subjective:     Patient ID: Drew Smith, male   DOB: 11-Nov-1940, 77 y.o.   MRN: [id number]  HPI   Patient here for Medicare wellness visit with our health coach and also for separate issues regarding several medical items as below.  He has history of hypertension, GERD, remote history of prostate cancer, history of Guillain-Barre syndrome, dyslipidemia. He's had dry cough for years which is unchanged. He's been on multiple things which did not seem to help much. He takes omeprazole for GERD symptoms and this is well controlled. No recent dysphagia. Appetite and weight are stable.  Hypertension treated with amlodipine and HCTZ. No headaches or dizziness. He has history of erectile dysfunction and takes generic Viagra for that. Compliant with medications.  He has history of recurrent seborrheic keratosis on the face and is requesting treatment of a couple today.  Past Medical History:  Diagnosis Date  . Arthritis   . Asthma   . Cancer [facility])    prostate  . GERD (gastroesophageal reflux disease)   . Guillain-Barre syndrome ([place]) 2000  . Hyperlipidemia   . Hypertension    Past Surgical History:  Procedure Laterality Date  . EYE SURGERY  233   77 years old  . HERNIA REPAIR  1995  . NOSE SURGERY  1990  . PROSTATE SURGERY  2000   seed inplants  . SHOULDER SURGERY  2008    reports that  has never smoked. he has never used smokeless tobacco. He reports that he does not use drugs. His alcohol history is not on file. family history includes Alcohol abuse in his father; Diabetes in his brother; Hyperlipidemia in his brother; Stroke in his brother. No Known Allergies   Review of Systems  Constitutional: Negative for fatigue.  Eyes: Negative for visual disturbance.  Respiratory: Positive for cough. Negative for chest tightness, shortness of breath and wheezing.   Cardiovascular: Negative for chest pain, palpitations and leg swelling.  Neurological: Negative for dizziness, syncope,  weakness, light-headedness and headaches.       Objective:   Physical Exam  Constitutional: He is oriented to person, place, and time. He appears well-developed and well-nourished.  HENT:  Right Ear: External ear normal.  Left Ear: External ear normal.  Mouth/Throat: Oropharynx is clear and moist.  Eyes: Pupils are equal, round, and reactive to light.  Neck: Neck supple. No thyromegaly present.  Cardiovascular: Normal rate and regular rhythm.  Pulmonary/Chest: Effort normal and breath sounds normal. No respiratory distress. He has no wheezes. He has no rales.  Musculoskeletal: He exhibits no edema.  Neurological: He is alert and oriented to person, place, and time.  Skin:  Patient is multiple scattered brownish scaly seborrheic keratoses on his face and forehead       Assessment:     #1 hypertension stable  #2 history of erectile dysfunction  #3 history of mild dyslipidemia  #4 GERD controlled with omeprazole  #5 chronic dry cough  #6 multiple seborrheic keratoses.    Plan:     -Continue regular medications. -Check labs with basic metabolic panel, hepatic panel, lipid panel -Had a long discussion regarding PSA and we've not recommended ongoing testing given his age- -pt requesting treatment of seborrheic keratoses of face.  These sometimes itch.  Discussed risks including blistering, pain, infection, and pt consented.  We treated total of 5 lesions.  These were treated with cryotherapy and pt tolerated weill.  [name] MD [facility] at [facility]

## 2017-03-19 NOTE — Patient Instructions (Addendum)
Drew Smith , Thank you for taking time to come for your Medicare Wellness Visit. I appreciate your ongoing commitment to your health goals. Please review the following plan we discussed and let me know if I can assist you in the future.    [email or web address]   These are the goals we discussed: Goals    . Patient Stated     Continue to stay healthy and visit [place]        This is a list of the screening recommended for you and due dates:  Health Maintenance  Topic Date Due  . Flu Shot  01/21/2023*  . Tetanus Vaccine  03/04/2040*  *Topic was postponed. The date shown is not the original due date.    Health Maintenance, Male A healthy lifestyle and preventive care is important for your health and wellness. Ask your health care provider about what schedule of regular examinations is right for you. What should I know about weight and diet? Eat a Healthy Diet  Eat plenty of vegetables, fruits, whole grains, low-fat dairy products, and lean protein.  Do not eat a lot of foods high in solid fats, added sugars, or salt.  Maintain a Healthy Weight Regular exercise can help you achieve or maintain a healthy weight. You should:  Do at least 150 minutes of exercise each week. The exercise should increase your heart rate and make you sweat (moderate-intensity exercise).  Do strength-training exercises at least twice a week.  Watch Your Levels of Cholesterol and Blood Lipids  Have your blood tested for lipids and cholesterol every 5 years starting at 78 years of age. If you are at high risk for heart disease, you should start having your blood tested when you are 79 years old. You may need to have your cholesterol levels checked more often if: ? Your lipid or cholesterol levels are high. ? You are older than 79 years of age. ? You are at high risk for heart disease.  What should I know about cancer screening? Many types of cancers can be detected early and may often be  prevented. Lung Cancer  You should be screened every year for lung cancer if: ? You are a current smoker who has smoked for at least 30 years. ? You are a former smoker who has quit within the past 15 years.  Talk to your health care provider about your screening options, when you should start screening, and how often you should be screened.  Colorectal Cancer  Routine colorectal cancer screening usually begins at 78 years of age and should be repeated every 5-10 years until you are 78 years old. You may need to be screened more often if early forms of precancerous polyps or small growths are found. Your health care provider may recommend screening at an earlier age if you have risk factors for colon cancer.  Your health care provider may recommend using home test kits to check for hidden blood in the stool.  A small camera at the end of a tube can be used to examine your colon (sigmoidoscopy or colonoscopy). This checks for the earliest forms of colorectal cancer.  Prostate and Testicular Cancer  Depending on your age and overall health, your health care provider may do certain tests to screen for prostate and testicular cancer.  Talk to your health care provider about any symptoms or concerns you have about testicular or prostate cancer.  Skin Cancer  Check your skin from head to toe regularly.  Tell your  health care provider about any new moles or changes in moles, especially if: ? There is a change in a mole's size, shape, or color. ? You have a mole that is larger than a pencil eraser.  Always use sunscreen. Apply sunscreen liberally and repeat throughout the day.  Protect yourself by wearing long sleeves, pants, a wide-brimmed hat, and sunglasses when outside.  What should I know about heart disease, diabetes, and high blood pressure?  If you are 13-42 years of age, have your blood pressure checked every 3-5 years. If you are 16 years of age or older, have your blood  pressure checked every year. You should have your blood pressure measured twice-once when you are at a hospital or clinic, and once when you are not at a hospital or clinic. Record the average of the two measurements. To check your blood pressure when you are not at a hospital or clinic, you can use: ? An automated blood pressure machine at a pharmacy. ? A home blood pressure monitor.  Talk to your health care provider about your target blood pressure.  If you are between 24-29 years old, ask your health care provider if you should take aspirin to prevent heart disease.  Have regular diabetes screenings by checking your fasting blood sugar level. ? If you are at a normal weight and have a low risk for diabetes, have this test once every three years after the age of 12. ? If you are overweight and have a high risk for diabetes, consider being tested at a younger age or more often.  A one-time screening for abdominal aortic aneurysm (AAA) by ultrasound is recommended for men aged 12-75 years who are current or former smokers. What should I know about preventing infection? Hepatitis B If you have a higher risk for hepatitis B, you should be screened for this virus. Talk with your health care provider to find out if you are at risk for hepatitis B infection. Hepatitis C Blood testing is recommended for:  Everyone born from [birth year] through 1966.  Anyone with known risk factors for hepatitis C.  Sexually Transmitted Diseases (STDs)  You should be screened each year for STDs including gonorrhea and chlamydia if: ? You are sexually active and are younger than 78 years of age. ? You are older than 78 years of age and your health care provider tells you that you are at risk for this type of infection. ? Your sexual activity has changed since you were last screened and you are at an increased risk for chlamydia or gonorrhea. Ask your health care provider if you are at risk.  Talk with your health  care provider about whether you are at high risk of being infected with HIV. Your health care provider may recommend a prescription medicine to help prevent HIV infection.  What else can I do?  Schedule regular health, dental, and eye exams.  Stay current with your vaccines (immunizations).  Do not use any tobacco products, such as cigarettes, chewing tobacco, and e-cigarettes. If you need help quitting, ask your health care provider.  Limit alcohol intake to no more than 2 drinks per day. One drink equals 12 ounces of beer, 5 ounces of wine, or 1 ounces of hard liquor.  Do not use street drugs.  Do not share needles.  Ask your health care provider for help if you need support or information about quitting drugs.  Tell your health care provider if you often feel depressed.  Tell  your health care provider if you have ever been abused or do not feel safe at home. This information is not intended to replace advice given to you by your health care provider. Make sure you discuss any questions you have with your health care provider. Document Released: 08/10/2007 Document Revised: 10/11/2015 Document Reviewed: 11/15/2014 Elsevier Interactive Patient Education  2018 [place] in the Home Falls can cause injuries and can affect people from all age groups. There are many simple things that you can do to make your home safe and to help prevent falls. What can I do on the outside of my home?  Regularly repair the edges of walkways and driveways and fix any cracks.  Remove high doorway thresholds.  Trim any shrubbery on the main path into your home.  Use bright outdoor lighting.  Clear walkways of debris and clutter, including tools and rocks.  Regularly check that handrails are securely fastened and in good repair. Both sides of any steps should have handrails.  Install guardrails along the edges of any raised decks or porches.  Have leaves, snow, and ice  cleared regularly.  Use sand or salt on walkways during winter months.  In the garage, clean up any spills right away, including grease or oil spills. What can I do in the bathroom?  Use night lights.  Install grab bars by the toilet and in the tub and shower. Do not use towel bars as grab bars.  Use non-skid mats or decals on the floor of the tub or shower.  If you need to sit down while you are in the shower, use a plastic, non-slip stool.  Keep the floor dry. Immediately clean up any water that spills on the floor.  Remove soap buildup in the tub or shower on a regular basis.  Attach bath mats securely with double-sided non-slip rug tape.  Remove throw rugs and other tripping hazards from the floor. What can I do in the bedroom?  Use night lights.  Make sure that a bedside light is easy to reach.  Do not use oversized bedding that drapes onto the floor.  Have a firm chair that has side arms to use for getting dressed.  Remove throw rugs and other tripping hazards from the floor. What can I do in the kitchen?  Clean up any spills right away.  Avoid walking on wet floors.  Place frequently used items in easy-to-reach places.  If you need to reach for something above you, use a sturdy step stool that has a grab bar.  Keep electrical cables out of the way.  Do not use floor polish or wax that makes floors slippery. If you have to use wax, make sure that it is non-skid floor wax.  Remove throw rugs and other tripping hazards from the floor. What can I do in the stairways?  Do not leave any items on the stairs.  Make sure that there are handrails on both sides of the stairs. Fix handrails that are broken or loose. Make sure that handrails are as long as the stairways.  Check any carpeting to make sure that it is firmly attached to the stairs. Fix any carpet that is loose or worn.  Avoid having throw rugs at the top or bottom of stairways, or secure the rugs with  carpet tape to prevent them from moving.  Make sure that you have a light switch at the top of the stairs and the bottom of the  stairs. If you do not have them, have them installed. What are some other fall prevention tips?  Wear closed-toe shoes that fit well and support your feet. Wear shoes that have rubber soles or low heels.  When you use a stepladder, make sure that it is completely opened and that the sides are firmly locked. Have someone hold the ladder while you are using it. Do not climb a closed stepladder.  Add color or contrast paint or tape to grab bars and handrails in your home. Place contrasting color strips on the first and last steps.  Use mobility aids as needed, such as canes, walkers, scooters, and crutches.  Turn on lights if it is dark. Replace any light bulbs that burn out.  Set up furniture so that there are clear paths. Keep the furniture in the same spot.  Fix any uneven floor surfaces.  Choose a carpet design that does not hide the edge of steps of a stairway.  Be aware of any and all pets.  Review your medicines with your healthcare provider. Some medicines can cause dizziness or changes in blood pressure, which increase your risk of falling. Talk with your health care provider about other ways that you can decrease your risk of falls. This may include working with a physical therapist or trainer to improve your strength, balance, and endurance. This information is not intended to replace advice given to you by your health care provider. Make sure you discuss any questions you have with your health care provider. Document Released: 02/01/2002 Document Revised: 07/11/2015 Document Reviewed: 03/18/2014 Elsevier Interactive Patient Education  [employer].

## 2017-03-21 ENCOUNTER — Telehealth: Payer: Self-pay | Admitting: Family Medicine

## 2017-03-21 NOTE — Telephone Encounter (Signed)
Pt returned call and lab results given to him with verbal understanding.

## 2017-04-10 DIAGNOSIS — R69 Illness, unspecified: Secondary | ICD-10-CM | POA: Diagnosis not present

## 2017-04-17 ENCOUNTER — Telehealth: Payer: Self-pay | Admitting: Family Medicine

## 2017-04-17 NOTE — Telephone Encounter (Signed)
Copied from [place] [id number]. Topic: Quick Communication - Rx Refill/Question >> Apr 17, 2017  1:39 PM Oliver Pila B wrote: Pt called to request this medication for hives, pt states [facility] is sending a request for the medication but pt wants to have the medication filled @ his [place], contact pt to advise  Medication: Levocetrizine 5mg  90 day supply   Has the patient contacted their pharmacy? Yes.    Preferred Pharmacy (with phone number or street name): [facility] [place]

## 2017-04-17 NOTE — Telephone Encounter (Signed)
I spoke with pt, advised provider is out of the office today, will be back in on Friday 04/18/2017. Pt said okay to forward message for review tomorrow.

## 2017-04-18 MED ORDER — LEVOCETIRIZINE DIHYDROCHLORIDE 5 MG PO TABS: 5.0000 mg | ORAL_TABLET | Freq: Every evening | ORAL | 3 refills | Status: DC

## 2017-04-18 NOTE — Telephone Encounter (Signed)
Rx done. 

## 2017-04-18 NOTE — Telephone Encounter (Signed)
Ok to refill for one year  

## 2017-05-01 ENCOUNTER — Other Ambulatory Visit: Payer: Self-pay | Admitting: Family Medicine

## 2017-05-08 ENCOUNTER — Ambulatory Visit: Payer: Self-pay

## 2017-05-08 ENCOUNTER — Encounter: Payer: Self-pay | Admitting: Family Medicine

## 2017-05-08 ENCOUNTER — Ambulatory Visit (INDEPENDENT_AMBULATORY_CARE_PROVIDER_SITE_OTHER): Payer: Medicare HMO | Admitting: Family Medicine

## 2017-05-08 VITALS — BP 110/62 | HR 70 | Temp 98.5°F | Ht 68.0 in | Wt 163.4 lb

## 2017-05-08 DIAGNOSIS — R05 Cough: Secondary | ICD-10-CM

## 2017-05-08 DIAGNOSIS — J4520 Mild intermittent asthma, uncomplicated: Secondary | ICD-10-CM

## 2017-05-08 DIAGNOSIS — J302 Other seasonal allergic rhinitis: Secondary | ICD-10-CM

## 2017-05-08 DIAGNOSIS — R059 Cough, unspecified: Secondary | ICD-10-CM

## 2017-05-08 DIAGNOSIS — J329 Chronic sinusitis, unspecified: Secondary | ICD-10-CM | POA: Diagnosis not present

## 2017-05-08 MED ORDER — ALBUTEROL SULFATE HFA 108 (90 BASE) MCG/ACT IN AERS: 2.0000 | INHALATION_SPRAY | Freq: Four times a day (QID) | RESPIRATORY_TRACT | 0 refills | Status: DC | PRN

## 2017-05-08 NOTE — Telephone Encounter (Signed)
Pt. Reports he has a cough" for years", but has this productive cough with green mucus,wheezing and shortness of breath x 3 days. Appointment made for today.  Reason for Disposition . [1] Continuous (nonstop) coughing interferes with work or school AND [2] no improvement using cough treatment per Care Advice  Answer Assessment - Initial Assessment Questions 1. ONSET: "When did the cough begin?"      Started "a long time ago" 2. SEVERITY: "How bad is the cough today?"      Moderate 3. RESPIRATORY DISTRESS: "Describe your breathing."      Short of breath and wheezing 4. FEVER: "Do you have a fever?" If so, ask: "What is your temperature, how was it measured, and when did it start?"     No 5. SPUTUM: "Describe the color of your sputum" (clear, white, yellow, green)     Green 6. HEMOPTYSIS: "Are you coughing up any blood?" If so ask: "How much?" (flecks, streaks, tablespoons, etc.)     No 7. CARDIAC HISTORY: "Do you have any history of heart disease?" (e.g., heart attack, congestive heart failure)      HTN 8. LUNG HISTORY: "Do you have any history of lung disease?"  (e.g., pulmonary embolus, asthma, emphysema)     Pneumonia 9. PE RISK FACTORS: "Do you have a history of blood clots?" (or: recent major surgery, recent prolonged travel, bedridden )     No 10. OTHER SYMPTOMS: "Do you have any other symptoms?" (e.g., runny nose, wheezing, chest pain)       Wheezing 11. PREGNANCY: "Is there any chance you are pregnant?" "When was your last menstrual period?"       No 12. TRAVEL: "Have you traveled out of the country in the last month?" (e.g., travel history, exposures)       No  Protocols used: [place]

## 2017-05-08 NOTE — Progress Notes (Signed)
HPI:  Using dictation device. Unfortunately this device frequently misinterprets words/phrases.  Drew Smith is a pleasant 77 year old with a past medical history significant for reported asthma, allergic rhinitis and acid reflux here for a chronic cough that is worse in the last several days.  He reports he has had a chronic cough for at least 13 years.  This past 3 days he has felt a little worse with increased sputum production and postnasal drip.  He felt mildly short of breath last night.  Reports he feels fine today.  He did cough for several days this week prior to symptoms starting.  He denies fevers, hemoptysis, thick discolored sputum, malaise, body aches, nausea or vomiting. He has been taken some Xyzal the last few days along with some Benadryl.  Takes a nasal spray, but he is not sure what.  It seems he prefers to stay away from oral medications such as antibiotics and steroids if possible as he thinks they caused feeling of brain in the past.  He is interested in using an inhaler, as one of his friends and his doctor told him that this would be very helpful for him.   ROS: See pertinent positives and negatives per HPI.  Past Medical History:  Diagnosis Date  . Arthritis   . Asthma   . Cancer [facility])    prostate  . GERD (gastroesophageal reflux disease)   . Guillain-Barre syndrome ([place]) 2000  . Hyperlipidemia   . Hypertension     Past Surgical History:  Procedure Laterality Date  . EYE SURGERY  4394   77 years old  . HERNIA REPAIR  1995  . NOSE SURGERY  1990  . PROSTATE SURGERY  2000   seed inplants  . SHOULDER SURGERY  2008    Family History  Problem Relation Age of Onset  . Hyperlipidemia Brother   . Stroke Brother   . Diabetes Brother        type ll  . Alcohol abuse Father        Current Outpatient Medications:  .  acyclovir (ZOVIRAX) 400 MG tablet, TAKE 1 TABLET (400 MG TOTAL) BY MOUTH AS NEEDED., Disp: 90 tablet, Rfl: 2 .  amLODipine (NORVASC) 5  MG tablet, TAKE 1 TABLET (5 MG TOTAL) BY MOUTH DAILY., Disp: 90 tablet, Rfl: 2 .  aspirin 81 MG tablet, Take 81 mg by mouth as needed. , Disp: , Rfl:  .  azelastine (ASTELIN) 0.1 % nasal spray, Place 1 spray into both nostrils 2 (two) times daily. Use in each nostril as directed, Disp: 30 mL, Rfl: 12 .  diphenhydrAMINE-zinc acetate (BENADRYL) cream, Apply 1 application topically daily as needed for itching., Disp: , Rfl:  .  fluorouracil (EFUDEX) 5 % cream, Apply topically as needed. Per Dr Joaquim Lai, Disp: , Rfl:  .  hydrochlorothiazide (MICROZIDE) 12.5 MG capsule, TAKE 1 CAPSULE (12.5 MG TOTAL) BY MOUTH DAILY., Disp: 90 capsule, Rfl: 2 .  ibuprofen (ADVIL,MOTRIN) 600 MG tablet, Only use 800 mg  8 hours sparingly. (Patient taking differently: Take 600 mg by mouth every 6 (six) hours as needed. Only use 800 mg  8 hours sparingly.), Disp: 60 tablet, Rfl: 0 .  levocetirizine (XYZAL) 5 MG tablet, Take 1 tablet (5 mg total) by mouth every evening., Disp: 90 tablet, Rfl: 3 .  LORazepam (ATIVAN) 0.5 MG tablet, Take one tablet one hour prior to procedure, Disp: 4 tablet, Rfl: 0 .  omeprazole (PRILOSEC) 20 MG capsule, TAKE ONE CAPSULE BY MOUTH TWICE A  DAY, Disp: 180 capsule, Rfl: 0 .  Oxymetazoline HCl (NASAL SPRAY) 0.05 % SOLN, Place into the nose., Disp: , Rfl:  .  sildenafil (REVATIO) 20 MG tablet, Take 2-5 tablets one hour prior to sexual activity., Disp: 50 tablet, Rfl: 0 .  triamcinolone cream (KENALOG) 0.1 %, APPLY TO AFFECTED AREA(S) TWO TIMES A DAY, Disp: 45 g, Rfl: 0 .  albuterol (PROAIR HFA) 108 (90 Base) MCG/ACT inhaler, Inhale 2 puffs into the lungs every 6 (six) hours as needed for wheezing or shortness of breath., Disp: 1 Inhaler, Rfl: 0  EXAM:  Vitals:   05/08/17 1556  BP: 110/62  Pulse: 70  Temp: 98.5 F (36.9 C)  SpO2: 97%    Body mass index is 24.84 kg/m.  GENERAL: vitals reviewed and listed above, alert, oriented, appears well hydrated and in no acute distress  HEENT:  atraumatic, conjunttiva clear, no obvious abnormalities on inspection of external nose and ears  NECK: no obvious masses on inspection  LUNGS: clear to auscultation bilaterally, no wheezes, rales or rhonchi, good air movement  CV: HRRR, no peripheral edema  MS: moves all extremities without noticeable abnormality  PSYCH: pleasant and cooperative, no obvious depression or anxiety  ASSESSMENT AND PLAN:  Discussed the following assessment and plan:  Cough  Rhinosinusitis  Seasonal allergies  Mild intermittent asthma, unspecified whether complicated  -Discussed potential etiologies, most likely is a mild viral upper respiratory illness or allergic rhinitis with mild asthma. -His friend who is a doctor told him an inhaler would be a good treatment.  We discussed risks and benefits and he would like to try an albuterol inhaler if he has excessive cough or any shortness of breath. -We discussed a good allergy regimen and he will check with his pharmacy and his current home medications and start an antihistamine and INS. -Discussed various symptomatic care options if this were a viral upper respiratory illness that he can use at home. -Patient advised to return or notify a doctor immediately if symptoms worsen or persist or new concerns arise.  Also advised him to follow-up promptly if any more shortness of breath that does not respond to the inhaler.  Patient Instructions  I sent the albuterol to the pharmacy to use as needed per instructions.  Only take the Xyzal, but do not take the Benadryl with this.  Take daily for the next few weeks.  Also would recommend taking a nasal steroid such as Flonase or Nasonex daily for the next several weeks to see if this helps.  Check with your pharmacy to see what you are currently taking.  I hope you are feeling better soon! Seek care promptly if your symptoms worsen, new concerns arise or you are not improving with treatment.     Lucretia Kern, DO

## 2017-05-08 NOTE — Patient Instructions (Signed)
I sent the albuterol to the pharmacy to use as needed per instructions.  Only take the Xyzal, but do not take the Benadryl with this.  Take daily for the next few weeks.  Also would recommend taking a nasal steroid such as Flonase or Nasonex daily for the next several weeks to see if this helps.  Check with your pharmacy to see what you are currently taking.  I hope you are feeling better soon! Seek care promptly if your symptoms worsen, new concerns arise or you are not improving with treatment.

## 2017-06-04 DIAGNOSIS — M9903 Segmental and somatic dysfunction of lumbar region: Secondary | ICD-10-CM | POA: Diagnosis not present

## 2017-06-04 DIAGNOSIS — M9905 Segmental and somatic dysfunction of pelvic region: Secondary | ICD-10-CM | POA: Diagnosis not present

## 2017-06-04 DIAGNOSIS — M5136 Other intervertebral disc degeneration, lumbar region: Secondary | ICD-10-CM | POA: Diagnosis not present

## 2017-06-04 DIAGNOSIS — M9904 Segmental and somatic dysfunction of sacral region: Secondary | ICD-10-CM | POA: Diagnosis not present

## 2017-06-09 ENCOUNTER — Other Ambulatory Visit: Payer: Self-pay | Admitting: Family Medicine

## 2017-06-14 ENCOUNTER — Other Ambulatory Visit: Payer: Self-pay | Admitting: Family Medicine

## 2017-07-09 ENCOUNTER — Ambulatory Visit (INDEPENDENT_AMBULATORY_CARE_PROVIDER_SITE_OTHER): Payer: Medicare HMO | Admitting: Family Medicine

## 2017-07-09 ENCOUNTER — Encounter: Payer: Self-pay | Admitting: Family Medicine

## 2017-07-09 ENCOUNTER — Ambulatory Visit (INDEPENDENT_AMBULATORY_CARE_PROVIDER_SITE_OTHER)
Admission: RE | Admit: 2017-07-09 | Discharge: 2017-07-09 | Disposition: A | Discharge status: Home / Self Care | Payer: Medicare HMO | Source: Ambulatory Visit | Attending: Family Medicine | Admitting: Family Medicine

## 2017-07-09 VITALS — BP 110/68 | HR 68 | Temp 98.7°F | Wt 165.1 lb

## 2017-07-09 DIAGNOSIS — R05 Cough: Secondary | ICD-10-CM

## 2017-07-09 DIAGNOSIS — R053 Chronic cough: Secondary | ICD-10-CM

## 2017-07-09 DIAGNOSIS — R21 Rash and other nonspecific skin eruption: Secondary | ICD-10-CM | POA: Diagnosis not present

## 2017-07-09 MED ORDER — BENZONATATE 100 MG PO CAPS: ORAL_CAPSULE | ORAL | 1 refills | Status: DC

## 2017-07-09 NOTE — Patient Instructions (Signed)
Increase the Omeprazole to 20 mg twice daily for now  Avoid eating within 2-3 hours of bedtime  Avoid ALL mint products.  Go for CXR as discussed.

## 2017-07-09 NOTE — Progress Notes (Signed)
Subjective:     Patient ID: Drew Smith, male   DOB: 08/23/1940, 77 y.o.   MRN: [id number]  HPI Patient seen with some persistent cough. He states he has coughed off and on for 20 years.  Never smoked but heavy secondhand exposure from both parents.  Patient states [place]. He developed URI type symptoms a few weeks ago. He was seen here in March for cough. His cough is mostly dry and occasionally productive of clear sputum. No fever. No hemoptysis. No appetite or weight changes. He has had some mild dyspnea associated with cough at times. He takes astelin nasal. Is not aware of any active postnasal drip symptoms.  Has history of GERD and takes omeprazole 20 mg but somewhat inconsistently. Occasional reflux symptoms. Recently using lemon mint cough drops.   Patient also complains of pruritic rash left foot greater than right. Erythematous and nonscaly. He's been taking some over-the-counter antihistamine with Xyzal which he thinks is helping. Denies any leg rash or systemic rash. No recent change of socks.  Past Medical History:  Diagnosis Date  . Arthritis   . Asthma   . Cancer [facility])    prostate  . GERD (gastroesophageal reflux disease)   . Guillain-Barre syndrome ([place]) 2000  . Hyperlipidemia   . Hypertension    Past Surgical History:  Procedure Laterality Date  . EYE SURGERY  7757   77 years old  . HERNIA REPAIR  1995  . NOSE SURGERY  1990  . PROSTATE SURGERY  2000   seed inplants  . SHOULDER SURGERY  2008    reports that he has never smoked. He has never used smokeless tobacco. He reports that he does not use drugs. His alcohol history is not on file. family history includes Alcohol abuse in his father; Diabetes in his brother; Hyperlipidemia in his brother; Stroke in his brother. No Known Allergies   Review of Systems  Constitutional: Positive for fatigue. Negative for appetite change,  chills, fever and unexpected weight change.  HENT: Negative for postnasal drip, sinus pressure, sinus pain, sore throat and voice change.   Respiratory: Positive for cough. Negative for chest tightness and wheezing.   Cardiovascular: Negative for chest pain and leg swelling.  Gastrointestinal: Negative for abdominal pain.  Musculoskeletal: Negative for back pain.  Skin: Positive for rash.  Neurological: Negative for dizziness and syncope.  Hematological: Negative for adenopathy.       Objective:   Physical Exam  Constitutional: He appears well-developed and well-nourished.  HENT:  Right Ear: External ear normal.  Left Ear: External ear normal.  Mouth/Throat: Oropharynx is clear and moist.  Neck: Neck supple.  Cardiovascular: Normal rate and regular rhythm.  Pulmonary/Chest: Effort normal and breath sounds normal. No respiratory distress. He has no wheezes. He has no rales.  Musculoskeletal: He exhibits no edema.  Lymphadenopathy:    He has no cervical adenopathy.  Skin: Rash noted.  Patient has blanching macular nonscaly rash dorsum left foot in patch like distribution. Sparing of interdigital spaces. No pustules. No vesicles.       Assessment:     #1 Chronic cough. Patient in no respiratory distress and nonfocal exam. No red flags such as fever, weight loss, hemoptysis, etc. Question related to GERD. He does have allergy tendencies to but no active postnasal drip symptoms. No reactive airway changes on exam and no ACE inhibitor use.  #2  skin rash dorsum left foot. This does not look fungal and appears to be more allergic. Question contact dermatitis    Plan:     -Obtain chest x-ray given duration of symptoms -Avoid all mint products -Increase omeprazole to 20 mg twice daily -Elevate head of bed 4-6 inches -Avoid eating within 2-3 hours at bedtime -Tessalon Perles 100 mg 1-2 every 8 hours as needed for cough suppression -Consider pulmonary referral if not resolving with  the above  -Continue with triamcinolone cream to foot rash and antihistamine as needed -Avoid socks with dark dyes   [name] MD [facility]

## 2017-07-28 DIAGNOSIS — M9903 Segmental and somatic dysfunction of lumbar region: Secondary | ICD-10-CM | POA: Diagnosis not present

## 2017-07-28 DIAGNOSIS — M5136 Other intervertebral disc degeneration, lumbar region: Secondary | ICD-10-CM | POA: Diagnosis not present

## 2017-07-28 DIAGNOSIS — M9904 Segmental and somatic dysfunction of sacral region: Secondary | ICD-10-CM | POA: Diagnosis not present

## 2017-07-28 DIAGNOSIS — M9905 Segmental and somatic dysfunction of pelvic region: Secondary | ICD-10-CM | POA: Diagnosis not present

## 2017-08-11 DIAGNOSIS — M9902 Segmental and somatic dysfunction of thoracic region: Secondary | ICD-10-CM | POA: Diagnosis not present

## 2017-08-11 DIAGNOSIS — M5136 Other intervertebral disc degeneration, lumbar region: Secondary | ICD-10-CM | POA: Diagnosis not present

## 2017-08-11 DIAGNOSIS — M9903 Segmental and somatic dysfunction of lumbar region: Secondary | ICD-10-CM | POA: Diagnosis not present

## 2017-08-11 DIAGNOSIS — M5134 Other intervertebral disc degeneration, thoracic region: Secondary | ICD-10-CM | POA: Diagnosis not present

## 2017-08-13 DIAGNOSIS — M9902 Segmental and somatic dysfunction of thoracic region: Secondary | ICD-10-CM | POA: Diagnosis not present

## 2017-08-13 DIAGNOSIS — M9903 Segmental and somatic dysfunction of lumbar region: Secondary | ICD-10-CM | POA: Diagnosis not present

## 2017-08-13 DIAGNOSIS — M5134 Other intervertebral disc degeneration, thoracic region: Secondary | ICD-10-CM | POA: Diagnosis not present

## 2017-08-13 DIAGNOSIS — M5136 Other intervertebral disc degeneration, lumbar region: Secondary | ICD-10-CM | POA: Diagnosis not present

## 2017-08-14 DIAGNOSIS — R69 Illness, unspecified: Secondary | ICD-10-CM | POA: Diagnosis not present

## 2017-08-25 DIAGNOSIS — M5136 Other intervertebral disc degeneration, lumbar region: Secondary | ICD-10-CM | POA: Diagnosis not present

## 2017-08-25 DIAGNOSIS — M5134 Other intervertebral disc degeneration, thoracic region: Secondary | ICD-10-CM | POA: Diagnosis not present

## 2017-08-25 DIAGNOSIS — M9902 Segmental and somatic dysfunction of thoracic region: Secondary | ICD-10-CM | POA: Diagnosis not present

## 2017-08-25 DIAGNOSIS — M9903 Segmental and somatic dysfunction of lumbar region: Secondary | ICD-10-CM | POA: Diagnosis not present

## 2017-09-04 ENCOUNTER — Encounter: Payer: Self-pay | Admitting: Internal Medicine

## 2017-09-04 ENCOUNTER — Ambulatory Visit (INDEPENDENT_AMBULATORY_CARE_PROVIDER_SITE_OTHER): Payer: Medicare HMO | Admitting: Internal Medicine

## 2017-09-04 VITALS — BP 130/68 | HR 66 | Temp 98.4°F | Wt 165.6 lb

## 2017-09-04 DIAGNOSIS — M5431 Sciatica, right side: Secondary | ICD-10-CM | POA: Diagnosis not present

## 2017-09-04 MED ORDER — TRIAMCINOLONE ACETONIDE 0.1 % EX CREA: TOPICAL_CREAM | CUTANEOUS | 1 refills | Status: DC

## 2017-09-04 NOTE — Patient Instructions (Addendum)
Sciatica Sciatica is pain, numbness, weakness, or tingling along your sciatic nerve. The sciatic nerve starts in the lower back and goes down the back of each leg. Sciatica happens when this nerve is pinched or has pressure put on it. Sciatica usually goes away on its own or with treatment. Sometimes, sciatica may keep coming back (recur). Follow these instructions at home: Medicines  Take over-the-counter and prescription medicines only as told by your doctor.  Do not drive or use heavy machinery while taking prescription pain medicine. Managing pain  If directed, put ice on the affected area. ? Put ice in a plastic bag. ? Place a towel between your skin and the bag. ? Leave the ice on for 20 minutes, 2-3 times a day.  After icing, apply heat to the affected area before you exercise or as often as told by your doctor. Use the heat source that your doctor tells you to use, such as a moist heat pack or a heating pad. ? Place a towel between your skin and the heat source. ? Leave the heat on for 20-30 minutes. ? Remove the heat if your skin turns bright red. This is especially important if you are unable to feel pain, heat, or cold. You may have a greater risk of getting burned. Activity  Return to your normal activities as told by your doctor. Ask your doctor what activities are safe for you. ? Avoid activities that make your sciatica worse.  Take short rests during the day. Rest in a lying or standing position. This is usually better than sitting to rest. ? When you rest for a long time, do some physical activity or stretching between periods of rest. ? Avoid sitting for a long time without moving. Get up and move around at least one time each hour.  Exercise and stretch regularly, as told by your doctor.  Do not lift anything that is heavier than 10 lb (4.5 kg) while you have symptoms of sciatica. ? Avoid lifting heavy things even when you do not have symptoms. ? Avoid lifting heavy  things over and over.  When you lift objects, always lift in a way that is safe for your body. To do this, you should: ? Bend your knees. ? Keep the object close to your body. ? Avoid twisting. General instructions  Use good posture. ? Avoid leaning forward when you are sitting. ? Avoid hunching over when you are standing.  Stay at a healthy weight.  Wear comfortable shoes that support your feet. Avoid wearing high heels.  Avoid sleeping on a mattress that is too soft or too hard. You might have less pain if you sleep on a mattress that is firm enough to support your back.  Keep all follow-up visits as told by your doctor. This is important. Contact a doctor if:  You have pain that: ? Wakes you up when you are sleeping. ? Gets worse when you lie down. ? Is worse than the pain you have had in the past. ? Lasts longer than 4 weeks.  You lose weight for without trying. Get help right away if:  You cannot control when you pee (urinate) or poop (have a bowel movement).  You have weakness in any of these areas and it gets worse. ? Lower back. ? Lower belly (pelvis). ? Butt (buttocks). ? Legs.  You have redness or swelling of your back.  You have a burning feeling when you pee.   Physical therapy consultation as discussed

## 2017-09-04 NOTE — Progress Notes (Signed)
Subjective:    Patient ID: Drew Smith, male    DOB: [birth year], 77 y.o.   MRN: [id number]  HPI  77 year old patient who presents with a month history of pain primarily in the right buttock area.  Pain is positional and aggravated by pressure and certain movements.  He has radiation of the pain down the right leg and also describes some paresthesias involving the right lateral thigh.  Denies any motor weakness. He states that he has had low back pain in the past that has responded nicely to physical therapy.  Pain is interfering with daily activities such as golf  He also complains of a pruritic rash involving the dorsal aspect of his left foot  Past Medical History:  Diagnosis Date  . Arthritis   . Asthma   . Cancer [facility])    prostate  . GERD (gastroesophageal reflux disease)   . Guillain-Barre syndrome ([place]) 2000  . Hyperlipidemia   . Hypertension      Social History   Socioeconomic History  . Marital status: Divorced    Spouse name: Not on file  . Number of children: Not on file  . Years of education: Not on file  . Highest education level: Not on file  Occupational History  . Not on file  Social Needs  . Financial resource strain: Not on file  . Food insecurity:    Worry: Not on file    Inability: Not on file  . Transportation needs:    Medical: Not on file    Non-medical: Not on file  Tobacco Use  . Smoking status: Never Smoker  . Smokeless tobacco: Never Used  Substance and Sexual Activity  . Alcohol use: Not on file    Comment: ave one a day; wine or other   . Drug use: No  . Sexual activity: Not on file  Lifestyle  . Physical activity:    Days per week: Not on file    Minutes per session: Not on file  . Stress: Not on file  Relationships  . Social connections:    Talks on phone: Not on file    Gets together: Not on file    Attends religious service: Not on file    Active member of club or organization: Not on file    Attends meetings of  clubs or organizations: Not on file    Relationship status: Not on file  . Intimate partner violence:    Fear of current or ex partner: Not on file    Emotionally abused: Not on file    Physically abused: Not on file    Forced sexual activity: Not on file  Other Topics Concern  . Not on file  Social History Narrative  . Not on file    Past Surgical History:  Procedure Laterality Date  . EYE SURGERY  22106   77 years old  . HERNIA REPAIR  1995  . NOSE SURGERY  1990  . PROSTATE SURGERY  2000   seed inplants  . SHOULDER SURGERY  2008    Family History  Problem Relation Age of Onset  . Hyperlipidemia Brother   . Stroke Brother   . Diabetes Brother        type ll  . Alcohol abuse Father     No Known Allergies  Current Outpatient Medications on File Prior to Visit  Medication Sig Dispense Refill  . acyclovir (ZOVIRAX) 400 MG tablet TAKE 1 TABLET (400 MG TOTAL) BY MOUTH AS  NEEDED. 90 tablet 2  . albuterol (PROAIR HFA) 108 (90 Base) MCG/ACT inhaler Inhale 2 puffs into the lungs every 6 (six) hours as needed for wheezing or shortness of breath. 1 Inhaler 0  . amLODipine (NORVASC) 5 MG tablet TAKE 1 TABLET (5 MG TOTAL) BY MOUTH DAILY. 90 tablet 2  . aspirin 81 MG tablet Take 81 mg by mouth as needed.     [name] azelastine (ASTELIN) 0.1 % nasal spray Place 1 spray into both nostrils 2 (two) times daily. Use in each nostril as directed 30 mL 12  . benzonatate (TESSALON) 100 MG capsule Take one to two every 8 hours as needed for cough 30 capsule 1  . diphenhydrAMINE-zinc acetate (BENADRYL) cream Apply 1 application topically daily as needed for itching.    . fluorouracil (EFUDEX) 5 % cream Apply topically as needed. Per Dr Joaquim Lai    . hydrochlorothiazide (MICROZIDE) 12.5 MG capsule TAKE 1 CAPSULE (12.5 MG TOTAL) BY MOUTH DAILY. 90 capsule 2  . ibuprofen (ADVIL,MOTRIN) 600 MG tablet Only use 800 mg  8 hours sparingly. (Patient taking differently: Take 600 mg by mouth every 6 (six) hours as  needed. Only use 800 mg  8 hours sparingly.) 60 tablet 0  . levocetirizine (XYZAL) 5 MG tablet Take 1 tablet (5 mg total) by mouth every evening. 90 tablet 3  . LORazepam (ATIVAN) 0.5 MG tablet Take one tablet one hour prior to procedure 4 tablet 0  . omeprazole (PRILOSEC) 20 MG capsule TAKE ONE CAPSULE BY MOUTH TWICE A DAY 180 capsule 0  . Oxymetazoline HCl (NASAL SPRAY) 0.05 % SOLN Place into the nose.    . sildenafil (REVATIO) 20 MG tablet Take 2-5 tablets one hour prior to sexual activity. 50 tablet 0   No current facility-administered medications on file prior to visit.     BP 130/68 (BP Location: Left Arm, Patient Position: Sitting, Cuff Size: Normal)   Pulse 66   Temp 98.4 F (36.9 C) (Oral)   Wt 165 lb 9.6 oz (75.1 kg)   SpO2 98%   BMI 25.18 kg/m     Review of Systems  Constitutional: Negative for appetite change, chills, fatigue and fever.  HENT: Negative for congestion, dental problem, ear pain, hearing loss, sore throat, tinnitus, trouble swallowing and voice change.   Eyes: Negative for pain, discharge and visual disturbance.  Respiratory: Negative for cough, chest tightness, wheezing and stridor.   Cardiovascular: Negative for chest pain, palpitations and leg swelling.  Gastrointestinal: Negative for abdominal distention, abdominal pain, blood in stool, constipation, diarrhea, nausea and vomiting.  Genitourinary: Negative for difficulty urinating, discharge, flank pain, genital sores, hematuria and urgency.  Musculoskeletal: Positive for back pain. Negative for arthralgias, gait problem, joint swelling, myalgias and neck stiffness.  Skin: Negative for rash.  Neurological: Negative for dizziness, syncope, speech difficulty, weakness, numbness and headaches.  Hematological: Negative for adenopathy. Does not bruise/bleed easily.  Psychiatric/Behavioral: Negative for behavioral problems and dysphoric mood. The patient is not nervous/anxious.        Objective:   Physical  Exam  Musculoskeletal:  Negative straight leg test No motor weakness.  Patient able to walk on his toes and heels without difficulty    Skin: Rash noted.  Erythematous slightly scaly rash involving the dorsal aspect of the left foot.  Does not involve the intertriginous areas          Assessment & Plan:   Probable right sided sciatica.  Patient has benefited from physical therapy in the  past we will set up.  Patient information dispensed Try Aleve 200 mg twice daily  Patient report any new or worsening symptoms Follow-up with PCP in 3 to 4 weeks  Marletta Lor

## 2017-09-08 DIAGNOSIS — M545 Low back pain: Secondary | ICD-10-CM | POA: Diagnosis not present

## 2017-09-08 DIAGNOSIS — M9902 Segmental and somatic dysfunction of thoracic region: Secondary | ICD-10-CM | POA: Diagnosis not present

## 2017-09-08 DIAGNOSIS — M5136 Other intervertebral disc degeneration, lumbar region: Secondary | ICD-10-CM | POA: Diagnosis not present

## 2017-09-08 DIAGNOSIS — M9903 Segmental and somatic dysfunction of lumbar region: Secondary | ICD-10-CM | POA: Diagnosis not present

## 2017-09-08 DIAGNOSIS — M5431 Sciatica, right side: Secondary | ICD-10-CM | POA: Diagnosis not present

## 2017-09-08 DIAGNOSIS — M5134 Other intervertebral disc degeneration, thoracic region: Secondary | ICD-10-CM | POA: Diagnosis not present

## 2017-09-10 DIAGNOSIS — M5136 Other intervertebral disc degeneration, lumbar region: Secondary | ICD-10-CM | POA: Diagnosis not present

## 2017-09-10 DIAGNOSIS — M5134 Other intervertebral disc degeneration, thoracic region: Secondary | ICD-10-CM | POA: Diagnosis not present

## 2017-09-10 DIAGNOSIS — M9902 Segmental and somatic dysfunction of thoracic region: Secondary | ICD-10-CM | POA: Diagnosis not present

## 2017-09-10 DIAGNOSIS — M9903 Segmental and somatic dysfunction of lumbar region: Secondary | ICD-10-CM | POA: Diagnosis not present

## 2017-09-11 DIAGNOSIS — M9902 Segmental and somatic dysfunction of thoracic region: Secondary | ICD-10-CM | POA: Diagnosis not present

## 2017-09-11 DIAGNOSIS — M5431 Sciatica, right side: Secondary | ICD-10-CM | POA: Diagnosis not present

## 2017-09-11 DIAGNOSIS — M545 Low back pain: Secondary | ICD-10-CM | POA: Diagnosis not present

## 2017-09-11 DIAGNOSIS — M5134 Other intervertebral disc degeneration, thoracic region: Secondary | ICD-10-CM | POA: Diagnosis not present

## 2017-09-11 DIAGNOSIS — M9903 Segmental and somatic dysfunction of lumbar region: Secondary | ICD-10-CM | POA: Diagnosis not present

## 2017-09-11 DIAGNOSIS — M5136 Other intervertebral disc degeneration, lumbar region: Secondary | ICD-10-CM | POA: Diagnosis not present

## 2017-09-15 DIAGNOSIS — M545 Low back pain: Secondary | ICD-10-CM | POA: Diagnosis not present

## 2017-09-15 DIAGNOSIS — M5431 Sciatica, right side: Secondary | ICD-10-CM | POA: Diagnosis not present

## 2017-09-17 DIAGNOSIS — M9903 Segmental and somatic dysfunction of lumbar region: Secondary | ICD-10-CM | POA: Diagnosis not present

## 2017-09-17 DIAGNOSIS — M5136 Other intervertebral disc degeneration, lumbar region: Secondary | ICD-10-CM | POA: Diagnosis not present

## 2017-09-17 DIAGNOSIS — M5134 Other intervertebral disc degeneration, thoracic region: Secondary | ICD-10-CM | POA: Diagnosis not present

## 2017-09-17 DIAGNOSIS — M9902 Segmental and somatic dysfunction of thoracic region: Secondary | ICD-10-CM | POA: Diagnosis not present

## 2017-09-18 DIAGNOSIS — M545 Low back pain: Secondary | ICD-10-CM | POA: Diagnosis not present

## 2017-09-18 DIAGNOSIS — M5431 Sciatica, right side: Secondary | ICD-10-CM | POA: Diagnosis not present

## 2017-09-27 ENCOUNTER — Other Ambulatory Visit: Payer: Self-pay | Admitting: Family Medicine

## 2017-10-09 ENCOUNTER — Other Ambulatory Visit: Payer: Self-pay | Admitting: Family Medicine

## 2017-10-15 DIAGNOSIS — M5136 Other intervertebral disc degeneration, lumbar region: Secondary | ICD-10-CM | POA: Diagnosis not present

## 2017-10-15 DIAGNOSIS — M9905 Segmental and somatic dysfunction of pelvic region: Secondary | ICD-10-CM | POA: Diagnosis not present

## 2017-10-15 DIAGNOSIS — M9903 Segmental and somatic dysfunction of lumbar region: Secondary | ICD-10-CM | POA: Diagnosis not present

## 2017-10-15 DIAGNOSIS — M9904 Segmental and somatic dysfunction of sacral region: Secondary | ICD-10-CM | POA: Diagnosis not present

## 2017-10-22 DIAGNOSIS — L309 Dermatitis, unspecified: Secondary | ICD-10-CM | POA: Diagnosis not present

## 2017-10-28 ENCOUNTER — Ambulatory Visit: Payer: Medicare HMO | Admitting: Family Medicine

## 2017-10-28 DIAGNOSIS — Z0289 Encounter for other administrative examinations: Secondary | ICD-10-CM

## 2017-11-18 ENCOUNTER — Encounter: Payer: Self-pay | Admitting: Family Medicine

## 2017-11-18 ENCOUNTER — Other Ambulatory Visit: Payer: Self-pay

## 2017-11-18 ENCOUNTER — Ambulatory Visit (INDEPENDENT_AMBULATORY_CARE_PROVIDER_SITE_OTHER): Payer: Medicare HMO | Admitting: Family Medicine

## 2017-11-18 VITALS — BP 124/66 | HR 65 | Temp 97.9°F | Ht 68.0 in | Wt 166.5 lb

## 2017-11-18 DIAGNOSIS — R21 Rash and other nonspecific skin eruption: Secondary | ICD-10-CM

## 2017-11-18 DIAGNOSIS — L820 Inflamed seborrheic keratosis: Secondary | ICD-10-CM

## 2017-11-18 DIAGNOSIS — L84 Corns and callosities: Secondary | ICD-10-CM

## 2017-11-18 NOTE — Progress Notes (Addendum)
Subjective:     Patient ID: Drew Smith, male   DOB: 10/14/1940, 77 y.o.   MRN: [id number]  HPI  Here today with several issues as follows  Painful right foot involving the ball of foot.  He has had callus there before.  He saw podiatrist couple years ago and had orthotics but does not wear this consistently.  Denies any injury.  Pain with ambulation.  Interfering with his golf.  He has tried to trim this couple times himself with filing down.  Rash left foot dorsally.  Was seen several months ago for this.  Has progressed somewhat proximally.  Pruritic.  Starting to become scaly.  Nonpainful.  No pustules.  No vesicles.  No right foot involvement.  Scaly area bridge of the nose.  Nonpainful.  No bleeding.  Requesting treatment with liquid nitrogen.  Past Medical History:  Diagnosis Date  . Arthritis   . Asthma   . Cancer [facility])    prostate  . GERD (gastroesophageal reflux disease)   . Guillain-Barre syndrome ([place]) 2000  . Hyperlipidemia   . Hypertension    Past Surgical History:  Procedure Laterality Date  . EYE SURGERY  629   77 years old  . HERNIA REPAIR  1995  . NOSE SURGERY  1990  . PROSTATE SURGERY  2000   seed inplants  . SHOULDER SURGERY  2008    reports that he has never smoked. He has never used smokeless tobacco. He reports that he does not use drugs. His alcohol history is not on file. family history includes Alcohol abuse in his father; Diabetes in his brother; Hyperlipidemia in his brother; Stroke in his brother. No Known Allergies  Review of Systems  Constitutional: Negative for appetite change, chills, fever and unexpected weight change.  Respiratory: Negative for shortness of breath.   Cardiovascular: Negative for chest pain.  Skin: Positive for rash.       Objective:   Physical Exam  Constitutional: He appears well-developed and well-nourished.  Cardiovascular: Normal rate and regular rhythm.  Pulmonary/Chest: Effort normal and breath sounds  normal.  Skin:  Right foot callused area ball the foot.  Slightly tender to palpation.  No underlying ulcer  Left foot reveals skin rash dorsally during most of the dorsum of the left foot.  Slightly scaly which was not present last time.  No pustules.  Minimal interdigital involvement between the fourth and fifth digits  Patient has slightly brownish slightly hyperkeratotic skin lesion at the bridge of the nose.  No ulceration.  Approximately 2 mm diameter.       Assessment:     #1 painful callus right foot  #2 skin rash left foot.  Rule out fungal  #3 probable seborrheic keratosis bridge of nose.    Plan:     -We recommended trimming right foot callus with #15 blade and patient consented.  This was trimmed without difficulty and patient did notice some symptomatic improvement afterwards Skin scraping with #15 blade for left foot rash.  Will send for KOH and fungal culture -Discussed risk and benefits of treatment with liquid nitrogen for lesion bridge of the nose.  We explained risk of pain, blistering, infection and patient consented.  Treated without difficulty -We have discussed importance of good skin protection in the past from sun  [name] MD [facility] [place] came back positive for Trichophyton species.  Will start Loprox cream- use bid and touch base 3-4 weeks if not clearing.  Pt aware.  [name] MD [facility] at [facility]

## 2017-11-28 ENCOUNTER — Telehealth: Payer: Self-pay | Admitting: Family Medicine

## 2017-11-28 LAB — FUNGAL STAIN
FUNGAL SMEAR: NONE SEEN
MICRO NUMBER:: 91149157
SPECIMEN QUALITY: ADEQUATE

## 2017-11-28 LAB — CULTURE, FUNGUS WITHOUT SMEAR
MICRO NUMBER: 91149158
SPECIMEN QUALITY:: ADEQUATE

## 2017-11-28 MED ORDER — CICLOPIROX OLAMINE 0.77 % EX CREA: TOPICAL_CREAM | Freq: Two times a day (BID) | CUTANEOUS | 1 refills | Status: DC

## 2017-11-28 NOTE — Addendum Note (Signed)
Addended by: [name] on: 11/28/2017 04:55 PM   Modules accepted: Orders

## 2017-11-28 NOTE — Telephone Encounter (Signed)
Dr. Elease Hashimoto has not yet reviewed the result.

## 2017-11-28 NOTE — Telephone Encounter (Signed)
Pt called.  Positive fungal cx.  Sent in Loprox cream- use bid

## 2017-11-28 NOTE — Telephone Encounter (Signed)
Please advise 

## 2017-11-28 NOTE — Telephone Encounter (Signed)
Copied from [place] [id number]. Topic: General - Other >> Nov 28, 2017  1:51 PM Keene Breath wrote: Reason for CRM: Patient called to get the results of a skin test that he had on his foot.  Please advise.  CB# [phone]

## 2017-12-01 DIAGNOSIS — M9903 Segmental and somatic dysfunction of lumbar region: Secondary | ICD-10-CM | POA: Diagnosis not present

## 2017-12-01 DIAGNOSIS — M9904 Segmental and somatic dysfunction of sacral region: Secondary | ICD-10-CM | POA: Diagnosis not present

## 2017-12-01 DIAGNOSIS — M5136 Other intervertebral disc degeneration, lumbar region: Secondary | ICD-10-CM | POA: Diagnosis not present

## 2017-12-01 DIAGNOSIS — M9905 Segmental and somatic dysfunction of pelvic region: Secondary | ICD-10-CM | POA: Diagnosis not present

## 2017-12-08 DIAGNOSIS — M9903 Segmental and somatic dysfunction of lumbar region: Secondary | ICD-10-CM | POA: Diagnosis not present

## 2017-12-08 DIAGNOSIS — M9904 Segmental and somatic dysfunction of sacral region: Secondary | ICD-10-CM | POA: Diagnosis not present

## 2017-12-08 DIAGNOSIS — M5136 Other intervertebral disc degeneration, lumbar region: Secondary | ICD-10-CM | POA: Diagnosis not present

## 2017-12-08 DIAGNOSIS — M9905 Segmental and somatic dysfunction of pelvic region: Secondary | ICD-10-CM | POA: Diagnosis not present

## 2017-12-16 DIAGNOSIS — M5136 Other intervertebral disc degeneration, lumbar region: Secondary | ICD-10-CM | POA: Diagnosis not present

## 2017-12-16 DIAGNOSIS — M9905 Segmental and somatic dysfunction of pelvic region: Secondary | ICD-10-CM | POA: Diagnosis not present

## 2017-12-16 DIAGNOSIS — M9903 Segmental and somatic dysfunction of lumbar region: Secondary | ICD-10-CM | POA: Diagnosis not present

## 2017-12-16 DIAGNOSIS — M9904 Segmental and somatic dysfunction of sacral region: Secondary | ICD-10-CM | POA: Diagnosis not present

## 2017-12-17 ENCOUNTER — Other Ambulatory Visit: Payer: Self-pay

## 2017-12-17 ENCOUNTER — Encounter: Payer: Self-pay | Admitting: Family Medicine

## 2017-12-17 ENCOUNTER — Ambulatory Visit (INDEPENDENT_AMBULATORY_CARE_PROVIDER_SITE_OTHER): Payer: Medicare HMO | Admitting: Family Medicine

## 2017-12-17 VITALS — BP 134/88 | HR 67 | Temp 98.0°F | Ht 68.0 in | Wt 165.4 lb

## 2017-12-17 DIAGNOSIS — L84 Corns and callosities: Secondary | ICD-10-CM

## 2017-12-17 DIAGNOSIS — L918 Other hypertrophic disorders of the skin: Secondary | ICD-10-CM

## 2017-12-17 DIAGNOSIS — B353 Tinea pedis: Secondary | ICD-10-CM

## 2017-12-17 NOTE — Progress Notes (Signed)
  Subjective:     Patient ID: Drew Smith, male   DOB: May 26, 1940, 77 y.o.   MRN: [id number]  HPI Patient seen for several issues as follows  Recent skin rash left foot.  We sent this for fungal culture which came back positive for Trichophyton species.  He is being treated with Loprox and rash is finally improving.  Recurrent right foot pain.  We trimmed the callus on the ball of his right foot recently and this helped tremendously.  He now some recurrent pain there and recurrent callus.  He plays golf frequently.  He thinks he is putting a lot of pressure on that foot with golf.  No foot injury.  Patient has a couple small skin tags left chin region which he requests treatment of with liquid nitrogen.  These are irritated because of shaving.  Past Medical History:  Diagnosis Date  . Arthritis   . Asthma   . Cancer [facility])    prostate  . GERD (gastroesophageal reflux disease)   . Guillain-Barre syndrome ([place]) 2000  . Hyperlipidemia   . Hypertension    Past Surgical History:  Procedure Laterality Date  . EYE SURGERY  4970   77 years old  . HERNIA REPAIR  1995  . NOSE SURGERY  1990  . PROSTATE SURGERY  2000   seed inplants  . SHOULDER SURGERY  2008    reports that he has never smoked. He has never used smokeless tobacco. He reports that he does not use drugs. His alcohol history is not on file. family history includes Alcohol abuse in his father; Diabetes in his brother; Hyperlipidemia in his brother; Stroke in his brother. No Known Allergies   Review of Systems  Constitutional: Negative for appetite change, chills, fever and unexpected weight change.  Cardiovascular: Negative for chest pain.  Skin: Positive for rash.       Objective:   Physical Exam  Constitutional: He appears well-developed and well-nourished.  Cardiovascular: Normal rate and regular rhythm.  Pulmonary/Chest: Effort normal and breath sounds normal.  Skin:  Left foot skin rash is resolving.  Much  less scaliness.  Less erythema.  Callus right foot-near the ball the foot  He has a couple of very small approximately 1 mm skin tags left side of face near the chin region.       Assessment:     #1 tinea pedis with Trichophyton species left foot clearing with Loprox  #2 recurrent callus ventral aspect right foot  #3 benign-appearing skin tags left side of face    Plan:     -Continue Loprox for another 2 to 3 weeks and this should be fully cleared -Use #15 blade and trimmed callus right foot per his request with some immediate relief of symptoms afterwards.  We suggested he consider arch support to see if he could offload some pressure on that region of his foot -Discussed risk and benefits of treatment liquid nitrogen to left face skin tags.  Patient consents.  2 skin tags were treated without difficulty  [name] MD [facility] at [facility]

## 2017-12-23 DIAGNOSIS — R69 Illness, unspecified: Secondary | ICD-10-CM | POA: Diagnosis not present

## 2017-12-31 ENCOUNTER — Other Ambulatory Visit: Payer: Self-pay | Admitting: Family Medicine

## 2018-01-13 DIAGNOSIS — I10 Essential (primary) hypertension: Secondary | ICD-10-CM | POA: Diagnosis not present

## 2018-01-13 DIAGNOSIS — R05 Cough: Secondary | ICD-10-CM | POA: Diagnosis not present

## 2018-01-13 DIAGNOSIS — J309 Allergic rhinitis, unspecified: Secondary | ICD-10-CM | POA: Diagnosis not present

## 2018-01-13 DIAGNOSIS — L309 Dermatitis, unspecified: Secondary | ICD-10-CM | POA: Diagnosis not present

## 2018-01-13 DIAGNOSIS — B009 Herpesviral infection, unspecified: Secondary | ICD-10-CM | POA: Diagnosis not present

## 2018-01-13 DIAGNOSIS — K219 Gastro-esophageal reflux disease without esophagitis: Secondary | ICD-10-CM | POA: Diagnosis not present

## 2018-01-13 DIAGNOSIS — Z7722 Contact with and (suspected) exposure to environmental tobacco smoke (acute) (chronic): Secondary | ICD-10-CM | POA: Diagnosis not present

## 2018-01-13 DIAGNOSIS — N529 Male erectile dysfunction, unspecified: Secondary | ICD-10-CM | POA: Diagnosis not present

## 2018-01-13 DIAGNOSIS — K08409 Partial loss of teeth, unspecified cause, unspecified class: Secondary | ICD-10-CM | POA: Diagnosis not present

## 2018-01-13 DIAGNOSIS — G8929 Other chronic pain: Secondary | ICD-10-CM | POA: Diagnosis not present

## 2018-01-19 DIAGNOSIS — L84 Corns and callosities: Secondary | ICD-10-CM | POA: Diagnosis not present

## 2018-01-19 DIAGNOSIS — L819 Disorder of pigmentation, unspecified: Secondary | ICD-10-CM | POA: Diagnosis not present

## 2018-01-19 DIAGNOSIS — L570 Actinic keratosis: Secondary | ICD-10-CM | POA: Diagnosis not present

## 2018-01-19 DIAGNOSIS — M9903 Segmental and somatic dysfunction of lumbar region: Secondary | ICD-10-CM | POA: Diagnosis not present

## 2018-01-19 DIAGNOSIS — L821 Other seborrheic keratosis: Secondary | ICD-10-CM | POA: Diagnosis not present

## 2018-01-19 DIAGNOSIS — D229 Melanocytic nevi, unspecified: Secondary | ICD-10-CM | POA: Diagnosis not present

## 2018-01-19 DIAGNOSIS — M5136 Other intervertebral disc degeneration, lumbar region: Secondary | ICD-10-CM | POA: Diagnosis not present

## 2018-01-19 DIAGNOSIS — M9902 Segmental and somatic dysfunction of thoracic region: Secondary | ICD-10-CM | POA: Diagnosis not present

## 2018-01-19 DIAGNOSIS — L814 Other melanin hyperpigmentation: Secondary | ICD-10-CM | POA: Diagnosis not present

## 2018-01-19 DIAGNOSIS — M5134 Other intervertebral disc degeneration, thoracic region: Secondary | ICD-10-CM | POA: Diagnosis not present

## 2018-01-19 DIAGNOSIS — L218 Other seborrheic dermatitis: Secondary | ICD-10-CM | POA: Diagnosis not present

## 2018-01-26 ENCOUNTER — Ambulatory Visit (INDEPENDENT_AMBULATORY_CARE_PROVIDER_SITE_OTHER): Payer: Medicare HMO | Admitting: Family Medicine

## 2018-01-26 ENCOUNTER — Other Ambulatory Visit: Payer: Self-pay

## 2018-01-26 ENCOUNTER — Encounter: Payer: Self-pay | Admitting: Family Medicine

## 2018-01-26 VITALS — BP 124/82 | HR 64 | Temp 98.2°F | Ht 68.0 in | Wt 169.4 lb

## 2018-01-26 DIAGNOSIS — L84 Corns and callosities: Secondary | ICD-10-CM

## 2018-01-26 DIAGNOSIS — S60511A Abrasion of right hand, initial encounter: Secondary | ICD-10-CM

## 2018-01-26 NOTE — Progress Notes (Signed)
  Subjective:     Patient ID: Drew Smith, male   DOB: [birth year], 77 y.o.   MRN: [id number]  HPI Patient seen for several issues as follows  Right hand pain.  Apparently had a couple of lesions treated with cryotherapy by dermatology recently.  He was playing golf and hit the back of his right hand and had some bleeding.  He has some soreness now but appears to be healing well  Recurrent right foot pain.  History of callus over the ball of the foot.  He has a Dietitian tournament in a couple weeks and would like to have this trimmed again.  This has helped in the past with trimming with 15 blade.  Past Medical History:  Diagnosis Date  . Arthritis   . Asthma   . Cancer [facility])    prostate  . GERD (gastroesophageal reflux disease)   . Guillain-Barre syndrome ([place]) 2000  . Hyperlipidemia   . Hypertension    Past Surgical History:  Procedure Laterality Date  . EYE SURGERY  2340   77 years old  . HERNIA REPAIR  1995  . NOSE SURGERY  1990  . PROSTATE SURGERY  2000   seed inplants  . SHOULDER SURGERY  2008    reports that he has never smoked. He has never used smokeless tobacco. He reports that he does not use drugs. His alcohol history is not on file. family history includes Alcohol abuse in his father; Diabetes in his brother; Hyperlipidemia in his brother; Stroke in his brother. No Known Allergies   Review of Systems  Constitutional: Negative for fatigue.  Eyes: Negative for visual disturbance.  Respiratory: Negative for cough, chest tightness and shortness of breath.   Cardiovascular: Negative for chest pain, palpitations and leg swelling.  Neurological: Negative for dizziness, syncope, weakness, light-headedness and headaches.       Objective:   Physical Exam  Constitutional: He is oriented to person, place, and time. He appears well-developed and well-nourished.  HENT:  Right Ear: External ear normal.  Left Ear: External ear normal.  Mouth/Throat: Oropharynx is  clear and moist.  Eyes: Pupils are equal, round, and reactive to light.  Neck: Neck supple. No thyromegaly present.  Cardiovascular: Normal rate and regular rhythm.  Pulmonary/Chest: Effort normal and breath sounds normal. No respiratory distress. He has no wheezes. He has no rales.  Musculoskeletal: He exhibits no edema.  Neurological: He is alert and oriented to person, place, and time.  Skin:  Small callused area right foot volar surface over the plantar aspect.  No evidence for plantar wart  He has a couple of scabbed areas dorsum right hand.  No signs of secondary infection       Assessment:     #1 callus right foot  #2 small eschars dorsum right hand with no signs of secondary infection    Plan:     - using #15 blade, trimmed callus right foot and pt tolerated well.   -keep hand clean with soap and water.  Follow up for signs of secondary infection.  [name] MD [facility] at [facility]

## 2018-01-26 NOTE — Patient Instructions (Signed)
Set up physical for early 2020

## 2018-02-05 ENCOUNTER — Other Ambulatory Visit: Payer: Self-pay | Admitting: Family Medicine

## 2018-02-06 ENCOUNTER — Other Ambulatory Visit: Payer: Self-pay | Admitting: Family Medicine

## 2018-02-09 NOTE — Telephone Encounter (Signed)
Refill once 

## 2018-02-10 ENCOUNTER — Other Ambulatory Visit: Payer: Self-pay

## 2018-02-10 MED ORDER — TRIAMCINOLONE ACETONIDE 0.1 % EX CREA: TOPICAL_CREAM | CUTANEOUS | 1 refills | Status: DC

## 2018-02-10 MED ORDER — CICLOPIROX OLAMINE 0.77 % EX CREA: TOPICAL_CREAM | Freq: Two times a day (BID) | CUTANEOUS | 1 refills | Status: DC

## 2018-02-10 NOTE — Telephone Encounter (Signed)
Rx done. 

## 2018-03-20 ENCOUNTER — Ambulatory Visit (INDEPENDENT_AMBULATORY_CARE_PROVIDER_SITE_OTHER): Payer: Medicare Other

## 2018-03-20 VITALS — BP 124/70 | HR 62 | Resp 14 | Ht 68.0 in | Wt 168.0 lb

## 2018-03-20 DIAGNOSIS — Z0000 Encounter for general adult medical examination without abnormal findings: Secondary | ICD-10-CM | POA: Diagnosis not present

## 2018-03-20 NOTE — Patient Instructions (Addendum)
Bring a copy of your living will and/or healthcare power of attorney to your next office visit.  Behavioral health referral made; please reach out to [place] if you do not hear from [place] in the next few days!   Continue to hydrate.  Follow-up with Dr. Elease Hashimoto about chronic cough and memory concerns. Great to do the memory group at [place]! Continue doing brain stimulating activities (puzzles, reading, adult coloring books, staying active) to keep memory sharp.    Drew Smith , Thank you for taking time to come for your Medicare Wellness Visit. I appreciate your ongoing commitment to your health goals. Please review the following plan we discussed and let me know if I can assist you in the future.   These are the goals we discussed: Goals    . Patient Stated     Continue to stay healthy and visit [place]     . Patient Stated     Keep dancing!       This is a list of the screening recommended for you and due dates:  Health Maintenance  Topic Date Due  . Flu Shot  01/21/2023*  . Tetanus Vaccine  03/04/2040*  *Topic was postponed. The date shown is not the original due date.     Fall Prevention in the Home, Adult Falls can cause injuries. They can happen to people of all ages. There are many things you can do to make your home safe and to help prevent falls. Ask for help when making these changes, if needed. What actions can I take to prevent falls? General Instructions  Use good lighting in all rooms. Replace any light bulbs that burn out.  Turn on the lights when you go into a dark area. Use night-lights.  Keep items that you use often in easy-to-reach places. Lower the shelves around your home if necessary.  Set up your furniture so you have a clear path. Avoid moving your furniture around.  Do not have throw rugs and other things on the floor that can make you trip.  Avoid walking on wet floors.  If any of your floors are uneven, fix them.  Add color or contrast  paint or tape to clearly mark and help you see: ? Any grab bars or handrails. ? First and last steps of stairways. ? Where the edge of each step is.  If you use a stepladder: ? Make sure that it is fully opened. Do not climb a closed stepladder. ? Make sure that both sides of the stepladder are locked into place. ? Ask someone to hold the stepladder for you while you use it.  If there are any pets around you, be aware of where they are. What can I do in the bathroom?      Keep the floor dry. Clean up any water that spills onto the floor as soon as it happens.  Remove soap buildup in the tub or shower regularly.  Use non-skid mats or decals on the floor of the tub or shower.  Attach bath mats securely with double-sided, non-slip rug tape.  If you need to sit down in the shower, use a plastic, non-slip stool.  Install grab bars by the toilet and in the tub and shower. Do not use towel bars as grab bars. What can I do in the bedroom?  Make sure that you have a light by your bed that is easy to reach.  Do not use any sheets or blankets that are too big  for your bed. They should not hang down onto the floor.  Have a firm chair that has side arms. You can use this for support while you get dressed. What can I do in the kitchen?  Clean up any spills right away.  If you need to reach something above you, use a strong step stool that has a grab bar.  Keep electrical cords out of the way.  Do not use floor polish or wax that makes floors slippery. If you must use wax, use non-skid floor wax. What can I do with my stairs?  Do not leave any items on the stairs.  Make sure that you have a light switch at the top of the stairs and the bottom of the stairs. If you do not have them, ask someone to add them for you.  Make sure that there are handrails on both sides of the stairs, and use them. Fix handrails that are broken or loose. Make sure that handrails are as long as the  stairways.  Install non-slip stair treads on all stairs in your home.  Avoid having throw rugs at the top or bottom of the stairs. If you do have throw rugs, attach them to the floor with carpet tape.  Choose a carpet that does not hide the edge of the steps on the stairway.  Check any carpeting to make sure that it is firmly attached to the stairs. Fix any carpet that is loose or worn. What can I do on the outside of my home?  Use bright outdoor lighting.  Regularly fix the edges of walkways and driveways and fix any cracks.  Remove anything that might make you trip as you walk through a door, such as a raised step or threshold.  Trim any bushes or trees on the path to your home.  Regularly check to see if handrails are loose or broken. Make sure that both sides of any steps have handrails.  Install guardrails along the edges of any raised decks and porches.  Clear walking paths of anything that might make someone trip, such as tools or rocks.  Have any leaves, snow, or ice cleared regularly.  Use sand or salt on walking paths during winter.  Clean up any spills in your garage right away. This includes grease or oil spills. What other actions can I take?  Wear shoes that: ? Have a low heel. Do not wear high heels. ? Have rubber bottoms. ? Are comfortable and fit you well. ? Are closed at the toe. Do not wear open-toe sandals.  Use tools that help you move around (mobility aids) if they are needed. These include: ? Canes. ? Walkers. ? Scooters. ? Crutches.  Review your medicines with your doctor. Some medicines can make you feel dizzy. This can increase your chance of falling. Ask your doctor what other things you can do to help prevent falls. Where to find more information  Centers for Disease Control and Prevention, [employer]ADI: https://garcia.biz/  [employer] on Aging: BrainJudge.co.uk Contact a doctor if:  You are afraid of falling at home.  You  feel weak, drowsy, or dizzy at home.  You fall at home. Summary  There are many simple things that you can do to make your home safe and to help prevent falls.  Ways to make your home safe include removing tripping hazards and installing grab bars in the bathroom.  Ask for help when making these changes in your home. This information is not intended  to replace advice given to you by your health care provider. Make sure you discuss any questions you have with your health care provider. Document Released: 12/08/2008 Document Revised: 09/26/2016 Document Reviewed: 09/26/2016 Elsevier Interactive Patient Education  2019 [place] Maintenance, Male A healthy lifestyle and preventive care is important for your health and wellness. Ask your health care provider about what schedule of regular examinations is right for you. What should I know about weight and diet? Eat a Healthy Diet  Eat plenty of vegetables, fruits, whole grains, low-fat dairy products, and lean protein.  Do not eat a lot of foods high in solid fats, added sugars, or salt.  Maintain a Healthy Weight Regular exercise can help you achieve or maintain a healthy weight. You should:  Do at least 150 minutes of exercise each week. The exercise should increase your heart rate and make you sweat (moderate-intensity exercise).  Do strength-training exercises at least twice a week. Watch Your Levels of Cholesterol and Blood Lipids  Have your blood tested for lipids and cholesterol every 5 years starting at 79 years of age. If you are at high risk for heart disease, you should start having your blood tested when you are 79 years old. You may need to have your cholesterol levels checked more often if: ? Your lipid or cholesterol levels are high. ? You are older than 79 years of age. ? You are at high risk for heart disease. What should I know about cancer screening? Many types of cancers can be detected early and may  often be prevented. Lung Cancer  You should be screened every year for lung cancer if: ? You are a current smoker who has smoked for at least 30 years. ? You are a former smoker who has quit within the past 15 years.  Talk to your health care provider about your screening options, when you should start screening, and how often you should be screened. Colorectal Cancer  Routine colorectal cancer screening usually begins at 79 years of age and should be repeated every 5-10 years until you are 79 years old. You may need to be screened more often if early forms of precancerous polyps or small growths are found. Your health care provider may recommend screening at an earlier age if you have risk factors for colon cancer.  Your health care provider may recommend using home test kits to check for hidden blood in the stool.  A small camera at the end of a tube can be used to examine your colon (sigmoidoscopy or colonoscopy). This checks for the earliest forms of colorectal cancer. Prostate and Testicular Cancer  Depending on your age and overall health, your health care provider may do certain tests to screen for prostate and testicular cancer.  Talk to your health care provider about any symptoms or concerns you have about testicular or prostate cancer. Skin Cancer  Check your skin from head to toe regularly.  Tell your health care provider about any new moles or changes in moles, especially if: ? There is a change in a mole's size, shape, or color. ? You have a mole that is larger than a pencil eraser.  Always use sunscreen. Apply sunscreen liberally and repeat throughout the day.  Protect yourself by wearing long sleeves, pants, a wide-brimmed hat, and sunglasses when outside. What should I know about heart disease, diabetes, and high blood pressure?  If you are 51-53 years of age, have your blood pressure checked every  3-5 years. If you are 48 years of age or older, have your blood  pressure checked every year. You should have your blood pressure measured twice-once when you are at a hospital or clinic, and once when you are not at a hospital or clinic. Record the average of the two measurements. To check your blood pressure when you are not at a hospital or clinic, you can use: ? An automated blood pressure machine at a pharmacy. ? A home blood pressure monitor.  Talk to your health care provider about your target blood pressure.  If you are between 52-52 years old, ask your health care provider if you should take aspirin to prevent heart disease.  Have regular diabetes screenings by checking your fasting blood sugar level. ? If you are at a normal weight and have a low risk for diabetes, have this test once every three years after the age of 1. ? If you are overweight and have a high risk for diabetes, consider being tested at a younger age or more often.  A one-time screening for abdominal aortic aneurysm (AAA) by ultrasound is recommended for men aged 57-75 years who are current or former smokers. What should I know about preventing infection? Hepatitis B If you have a higher risk for hepatitis B, you should be screened for this virus. Talk with your health care provider to find out if you are at risk for hepatitis B infection. Hepatitis C Blood testing is recommended for:  Everyone born from [birth year] through 1966.  Anyone with known risk factors for hepatitis C. Sexually Transmitted Diseases (STDs)  You should be screened each year for STDs including gonorrhea and chlamydia if: ? You are sexually active and are younger than 79 years of age. ? You are older than 79 years of age and your health care provider tells you that you are at risk for this type of infection. ? Your sexual activity has changed since you were last screened and you are at an increased risk for chlamydia or gonorrhea. Ask your health care provider if you are at risk.  Talk with your health care  provider about whether you are at high risk of being infected with HIV. Your health care provider may recommend a prescription medicine to help prevent HIV infection. What else can I do?  Schedule regular health, dental, and eye exams.  Stay current with your vaccines (immunizations).  Do not use any tobacco products, such as cigarettes, chewing tobacco, and e-cigarettes. If you need help quitting, ask your health care provider.  Limit alcohol intake to no more than 2 drinks per day. One drink equals 12 ounces of beer, 5 ounces of wine, or 1 ounces of hard liquor.  Do not use street drugs.  Do not share needles.  Ask your health care provider for help if you need support or information about quitting drugs.  Tell your health care provider if you often feel depressed.  Tell your health care provider if you have ever been abused or do not feel safe at home. This information is not intended to replace advice given to you by your health care provider. Make sure you discuss any questions you have with your health care provider. Document Released: 08/10/2007 Document Revised: 10/11/2015 Document Reviewed: 11/15/2014 Elsevier Interactive Patient Education  2019 [employer].

## 2018-03-20 NOTE — Progress Notes (Addendum)
Subjective:   Drew Smith is a 78 y.o. male who presents for Medicare Annual/Subsequent preventive examination.  Review of Systems:  No ROS.  Medicare Wellness Visit. Additional risk factors are reflected in the social history.  Cardiac Risk Factors include: advanced age (>52men, >39 women) Sleep patterns: no sleep issues.    Home Safety/Smoke Alarms: Feels safe in home. Smoke alarms in place.  Living environment; residence and Firearm Safety: 1-story house/ trailer. Safety conscious regarding falls, especially since his guillian barre several years ago.  Seat Belt Safety/Bike Helmet: Wears seat belt.   Male:   CCS- none of file. PHY with Dr. Elease Hashimoto soon, will notify.     PSA-  Lab Results  Component Value Date   PSA 0.03 (L) 03/12/2016   PSA 0.01 (L) 04/28/2014   PSA 0.01 (L) 04/22/2012       Objective:    Vitals: BP 124/70 (BP Location: Right Arm, Patient Position: Sitting, Cuff Size: Normal)   Pulse 62   Resp 14   Ht 5\' 8"  (1.727 m)   Wt 168 lb (76.2 kg)   SpO2 97%   BMI 25.54 kg/m   Body mass index is 25.54 kg/m.  Advanced Directives 03/20/2018 03/19/2017  Does Patient Have a Medical Advance Directive? No;Yes No  Type of Paramedic of [place];Living will -  Does patient want to make changes to medical advance directive? Yes (MAU/Ambulatory/Procedural Areas - Information given) -  Copy of [place] in Chart? No - copy requested -    Tobacco Social History   Tobacco Use  Smoking Status Never Smoker  Smokeless Tobacco Never Used     Counseling given: Not Answered  Past Medical History:  Diagnosis Date  . Arthritis   . Asthma   . Cancer [facility])    prostate  . GERD (gastroesophageal reflux disease)   . Guillain-Barre syndrome ([place]) 2000  . Hyperlipidemia   . Hypertension    Past Surgical History:  Procedure Laterality Date  . EYE SURGERY  1212   78 years old  . HERNIA REPAIR  1995  . NOSE SURGERY   1990  . PROSTATE SURGERY  2000   seed inplants  . SHOULDER SURGERY  2008   Family History  Problem Relation Age of Onset  . Hyperlipidemia Brother   . Stroke Brother   . Diabetes Brother        type ll  . Alcohol abuse Father    Social History   Socioeconomic History  . Marital status: Divorced    Spouse name: Not on file  . Number of children: 2  . Years of education: Not on file  . Highest education level: Not on file  Occupational History  . Not on file  Social Needs  . Financial resource strain: Not hard at all  . Food insecurity:    Worry: Never true    Inability: Never true  . Transportation needs:    Medical: No    Non-medical: No  Tobacco Use  . Smoking status: Never Smoker  . Smokeless tobacco: Never Used  Substance and Sexual Activity  . Alcohol use: Yes    Alcohol/week: 7.0 standard drinks    Types: 7 Glasses of wine per week    Comment: have one a day; wine or other   . Drug use: No  . Sexual activity: Not Currently  Lifestyle  . Physical activity:    Days per week: 5 days    Minutes  per session: 60 min  . Stress: Only a little  Relationships  . Social connections:    Talks on phone: More than three times a week    Gets together: More than three times a week    Attends religious service: Never    Active member of club or organization: Yes    Attends meetings of clubs or organizations: More than 4 times per year    Relationship status: Not on file  Other Topics Concern  . Not on file  Social History Narrative   Lives alone in one level. Has two sons and friends who serve as support.   Former Chiropodist; Enjoys Data processing manager, reading    Outpatient Encounter Medications as of 03/20/2018  Medication Sig  . acyclovir (ZOVIRAX) 400 MG tablet TAKE 1 TABLET (400 MG TOTAL) BY MOUTH AS NEEDED.  [name] amLODipine (NORVASC) 5 MG tablet TAKE 1 TABLET (5 MG TOTAL) BY MOUTH DAILY.  [name] azelastine (ASTELIN) 0.1 % nasal spray Place 1 spray into both nostrils 2  (two) times daily. Use in each nostril as directed (Patient taking differently: Place 1 spray into both nostrils 2 (two) times daily as needed. Use in each nostril as directed)  . benzonatate (TESSALON) 100 MG capsule Take one to two every 8 hours as needed for cough  . ciclopirox (LOPROX) 0.77 % cream Apply topically 2 (two) times daily.  . diphenhydrAMINE-zinc acetate (BENADRYL) cream Apply 1 application topically daily as needed for itching.  . hydrochlorothiazide (MICROZIDE) 12.5 MG capsule TAKE 1 CAPSULE (12.5 MG TOTAL) BY MOUTH DAILY.  [name] ibuprofen (ADVIL,MOTRIN) 600 MG tablet Only use 800 mg  8 hours sparingly. (Patient taking differently: Take 600 mg by mouth every 6 (six) hours as needed. Only use 800 mg  8 hours sparingly.)  . levocetirizine (XYZAL) 5 MG tablet Take 1 tablet (5 mg total) by mouth every evening.  [name] omeprazole (PRILOSEC) 20 MG capsule TAKE ONE CAPSULE BY MOUTH TWICE A DAY  . Oxymetazoline HCl (NASAL SPRAY) 0.05 % SOLN Place into the nose.  . sildenafil (REVATIO) 20 MG tablet TAKE 2 TO 5 TABLETS BY MOUTH 1 HOUR PRIOR TO SEXUAL ACTIVITY  . triamcinolone cream (KENALOG) 0.1 % APPLY TO AFFECTED AREA(S) TWO TIMES A DAY  . VENTOLIN HFA 108 (90 Base) MCG/ACT inhaler INHALE TWO PUFFS BY MOUTH EVERY 6 HOURS AS NEEDED FOR WHEEZING OR SHORTNESS OF BREATH  . fluorouracil (EFUDEX) 5 % cream Apply topically as needed. Per Dr Joaquim Lai  . [DISCONTINUED] aspirin 81 MG tablet Take 81 mg by mouth as needed.   . [DISCONTINUED] LORazepam (ATIVAN) 0.5 MG tablet Take one tablet one hour prior to procedure (Patient not taking: Reported on 03/20/2018)   No facility-administered encounter medications on file as of 03/20/2018.     Activities of Daily Living In your present state of health, do you have any difficulty performing the following activities: 03/20/2018  Hearing? N  Vision? N  Difficulty concentrating or making decisions? N  Walking or climbing stairs? N  Dressing or bathing? N  Doing  errands, shopping? N  Preparing Food and eating ? N  Using the Toilet? N  In the past six months, have you accidently leaked urine? N  Do you have problems with loss of bowel control? N  Managing your Medications? N  Managing your Finances? N  Housekeeping or managing your Housekeeping? N  Some recent data might be hidden    Patient Care Team: [name], MD as PCP -  General (Family Medicine) Stephannie Li, [place] (Ophthalmology)   Assessment:   This is a routine wellness examination for Drew Smith. Physical assessment deferred to PCP.   Exercise Activities and Dietary recommendations Current Exercise Habits: Structured exercise class, Type of exercise: walking(dance, golf), Time (Minutes): 30, Frequency (Times/Week): 6, Weekly Exercise (Minutes/Week): 180, Intensity: Moderate Diet (meal preparation, eat out, water intake, caffeinated beverages, dairy products, fruits and vegetables): in general, a "healthy" diet  , low salt. Admits he "probably needs to drink more water".   Goals    . Patient Stated     Continue to stay healthy and visit [place]     . Patient Stated     Keep dancing!       Fall Risk Fall Risk  03/20/2018 03/19/2017 03/19/2017 06/27/2015 09/23/2013  Falls in the past year? 0 No No No No  Number falls in past yr: 0 - - - -  Follow up Education provided - - - -    Depression Screen PHQ 2/9 Scores 03/20/2018 03/19/2017 03/19/2017 06/27/2015  PHQ - 2 Score 2 0 0 0  PHQ- 9 Score 3 - - -    Cognitive Function MMSE - Mini Mental State Exam 03/20/2018  Orientation to time 5  Orientation to Place 5  Registration 3  Attention/ Calculation 5  Recall 3  Language- name 2 objects 2  Language- repeat 1  Language- follow 3 step command 3  Language- read & follow direction 1  Write a sentence 1  Copy design 1  Total score 30    Pt. Concerned about memory, stating he sometimes forgets what he is doing. MMSE completed, and pt. Able to perform all of the tasks  without much difficulty. Pt. Stated he plans to go to a memory support group through [facility] soon. Will notify PCP about pt concerns.     Immunization History  Administered Date(s) Administered  . Pneumococcal Polysaccharide-23 11/28/2010    Qualifies for Shingles Vaccine? No, d/t guillian barre hx  Screening Tests Health Maintenance  Topic Date Due  . TETANUS/TDAP  03/04/2040 (Originally 08/29/1959)  . INFLUENZA VACCINE  Discontinued        Plan:    Bring a copy of your living will and/or healthcare power of attorney to your next office visit.  Behavioral health referral made; please reach out to [place] if you do not hear from [place] in the next few days!   Continue to hydrate.  Follow-up with Dr. Elease Hashimoto about chronic cough and memory concerns. Great to do the memory group at [facility]! Continue doing brain stimulating activities (puzzles, reading, adult coloring books, staying active) to keep memory sharp.  I have personally reviewed and noted the following in the patient's chart:   . Medical and social history . Use of alcohol, tobacco or illicit drugs  . Current medications and supplements . Functional ability and status . Nutritional status . Physical activity . Advanced directives . List of other physicians . Vitals . Screenings to include cognitive, depression, and falls . Referrals and appointments  In addition, I have reviewed and discussed with patient certain preventive protocols, quality metrics, and best practice recommendations. A written personalized care plan for preventive services as well as general preventive health recommendations were provided to patient.     Alphia Moh, RN  03/20/2018  I have reviewed the documentation for the AWV and [place] provided by the health coach and agree with their documentation. I was immediately available for any questions  Bruce  Dan Europe MD [facility] at [facility]

## 2018-03-30 DIAGNOSIS — H527 Unspecified disorder of refraction: Secondary | ICD-10-CM | POA: Diagnosis not present

## 2018-03-30 DIAGNOSIS — H2513 Age-related nuclear cataract, bilateral: Secondary | ICD-10-CM | POA: Diagnosis not present

## 2018-03-30 DIAGNOSIS — H25013 Cortical age-related cataract, bilateral: Secondary | ICD-10-CM | POA: Diagnosis not present

## 2018-03-31 ENCOUNTER — Telehealth: Payer: Self-pay | Admitting: Family Medicine

## 2018-03-31 ENCOUNTER — Other Ambulatory Visit: Payer: Self-pay

## 2018-03-31 ENCOUNTER — Ambulatory Visit (INDEPENDENT_AMBULATORY_CARE_PROVIDER_SITE_OTHER): Payer: Medicare Other | Admitting: Family Medicine

## 2018-03-31 ENCOUNTER — Encounter: Payer: Self-pay | Admitting: Family Medicine

## 2018-03-31 VITALS — BP 122/82 | HR 60 | Temp 98.0°F | Ht 66.25 in | Wt 161.8 lb

## 2018-03-31 DIAGNOSIS — I10 Essential (primary) hypertension: Secondary | ICD-10-CM | POA: Diagnosis not present

## 2018-03-31 DIAGNOSIS — Z8546 Personal history of malignant neoplasm of prostate: Secondary | ICD-10-CM | POA: Diagnosis not present

## 2018-03-31 DIAGNOSIS — E785 Hyperlipidemia, unspecified: Secondary | ICD-10-CM | POA: Diagnosis not present

## 2018-03-31 DIAGNOSIS — Z0000 Encounter for general adult medical examination without abnormal findings: Secondary | ICD-10-CM

## 2018-03-31 LAB — CBC WITH DIFFERENTIAL/PLATELET
Basophils Absolute: 0 10*3/uL (ref 0.0–0.1)
Basophils Relative: 0.1 % (ref 0.0–3.0)
Eosinophils Absolute: 0.3 10*3/uL (ref 0.0–0.7)
Eosinophils Relative: 4 % (ref 0.0–5.0)
HCT: 43.2 % (ref 39.0–52.0)
Hemoglobin: 14.9 g/dL (ref 13.0–17.0)
Lymphocytes Relative: 23.9 % (ref 12.0–46.0)
Lymphs Abs: 1.9 10*3/uL (ref 0.7–4.0)
MCHC: 34.4 g/dL (ref 30.0–36.0)
MCV: 92.7 fl (ref 78.0–100.0)
MONO ABS: 0.8 10*3/uL (ref 0.1–1.0)
Monocytes Relative: 10.4 % (ref 3.0–12.0)
Neutro Abs: 4.9 10*3/uL (ref 1.4–7.7)
Neutrophils Relative %: 61.6 % (ref 43.0–77.0)
Platelets: 267 10*3/uL (ref 150.0–400.0)
RBC: 4.66 Mil/uL (ref 4.22–5.81)
RDW: 12.9 % (ref 11.5–15.5)
WBC: 7.9 10*3/uL (ref 4.0–10.5)

## 2018-03-31 LAB — LIPID PANEL
Cholesterol: 161 mg/dL (ref 0–200)
HDL: 46.4 mg/dL (ref >39.00)
LDL Cholesterol: 94 mg/dL (ref 0–99)
NonHDL: 114.98
Total CHOL/HDL Ratio: 3
Triglycerides: 104 mg/dL (ref 0.0–149.0)
VLDL: 20.8 mg/dL (ref 0.0–40.0)

## 2018-03-31 LAB — BASIC METABOLIC PANEL WITH GFR
BUN: 17 mg/dL (ref 6–23)
CO2: 32 meq/L (ref 19–32)
Calcium: 9.6 mg/dL (ref 8.4–10.5)
Chloride: 97 meq/L (ref 96–112)
Creatinine, Ser: 0.92 mg/dL (ref 0.40–1.50)
GFR: 79.45 mL/min (ref >60.00)
Glucose, Bld: 97 mg/dL (ref 70–99)
Potassium: 3.9 meq/L (ref 3.5–5.1)
Sodium: 136 meq/L (ref 135–145)

## 2018-03-31 LAB — HEPATIC FUNCTION PANEL
ALT: 11 U/L (ref 0–53)
AST: 11 U/L (ref 0–37)
Albumin: 4.2 g/dL (ref 3.5–5.2)
Alkaline Phosphatase: 59 U/L (ref 39–117)
Bilirubin, Direct: 0.1 mg/dL (ref 0.0–0.3)
Total Bilirubin: 0.7 mg/dL (ref 0.2–1.2)
Total Protein: 6.9 g/dL (ref 6.0–8.3)

## 2018-03-31 NOTE — Telephone Encounter (Signed)
Please see message.  Please advise. 

## 2018-03-31 NOTE — Progress Notes (Signed)
Subjective:     Patient ID: Drew Smith, male   DOB: 1940-08-09, 78 y.o.   MRN: [id number]  HPI Patient is seen for physical exam.  He had Medicare wellness visit last week.  His chronic problems include history of hypertension, GERD, squamous cell skin cancer, prostate cancer, dyslipidemia.  He is also had history of game Barre syndrome and does not get flu vaccine or tetanus.  He declines any other vaccinations.  He had Cologuard 2 years ago negative and declines colonoscopy.  Main issue is some chronic cough.  No appetite or weight changes.  No dyspnea.  He takes omeprazole twice daily for GERD.  No obvious GERD symptoms.  No obvious postnasal drip symptoms currently.  No audible wheezing.  Past Medical History:  Diagnosis Date  . Arthritis   . Asthma   . Cancer [facility])    prostate  . GERD (gastroesophageal reflux disease)   . Guillain-Barre syndrome ([place]) 2000  . Hyperlipidemia   . Hypertension    Past Surgical History:  Procedure Laterality Date  . EYE SURGERY  366   78 years old  . HERNIA REPAIR  1995  . NOSE SURGERY  1990  . PROSTATE SURGERY  2000   seed inplants  . SHOULDER SURGERY  2008    reports that he has never smoked. He has never used smokeless tobacco. He reports current alcohol use of about 7.0 standard drinks of alcohol per week. He reports that he does not use drugs. family history includes Alcohol abuse in his father; Diabetes in his brother; Hyperlipidemia in his brother; Stroke in his brother. No Known Allergies   Review of Systems  Constitutional: Negative for activity change, appetite change, fatigue, fever and unexpected weight change.  HENT: Negative for congestion, ear pain and trouble swallowing.   Eyes: Negative for pain and visual disturbance.  Respiratory: Positive for cough. Negative for apnea, shortness of breath and wheezing.   Cardiovascular: Negative for chest pain and palpitations.  Gastrointestinal: Negative for abdominal distention,  abdominal pain, blood in stool, constipation, diarrhea, nausea, rectal pain and vomiting.  Genitourinary: Negative for dysuria, hematuria and testicular pain.  Musculoskeletal: Negative for arthralgias and joint swelling.  Skin: Negative for rash.  Neurological: Negative for dizziness, syncope and headaches.  Hematological: Negative for adenopathy.  Psychiatric/Behavioral: Negative for confusion and dysphoric mood.       Objective:   Physical Exam Constitutional:      General: He is not in acute distress.    Appearance: He is well-developed.  HENT:     Head: Normocephalic and atraumatic.     Right Ear: External ear normal.     Left Ear: External ear normal.  Eyes:     Conjunctiva/sclera: Conjunctivae normal.     Pupils: Pupils are equal, round, and reactive to light.  Neck:     Musculoskeletal: Normal range of motion and neck supple.     Thyroid: No thyromegaly.  Cardiovascular:     Rate and Rhythm: Normal rate and regular rhythm.     Heart sounds: Normal heart sounds. No murmur.  Pulmonary:     Effort: No respiratory distress.     Breath sounds: No wheezing or rales.  Abdominal:     General: Bowel sounds are normal. There is no distension.     Palpations: Abdomen is soft. There is no mass.     Tenderness: There is no abdominal tenderness. There is no guarding or rebound.  Lymphadenopathy:     Cervical: No  cervical adenopathy.  Skin:    Findings: No rash.  Neurological:     Mental Status: He is alert and oriented to person, place, and time.     Cranial Nerves: No cranial nerve deficit.     Deep Tendon Reflexes: Reflexes normal.        Assessment:     Physical exam.  Generally healthy 78 year old male.  He has history of some chronic mild cough but nonfocal exam.  No red flags such as appetite or weight change.  Chest x-ray last year unremarkable.    Plan:     -Discussed possible referral pulmonary regarding chronic cough but he declines at this time. -Obtain  screening labs. -Continue regular exercise habits.  [name] MD [facility] at [name]

## 2018-03-31 NOTE — Telephone Encounter (Signed)
Pt called and wants to know the provider's recommendation for signing up to the the Life Line Screening. If someone form the office would give him a call back regarding this. Can leave a message on the answering machine.

## 2018-03-31 NOTE — Telephone Encounter (Signed)
I think that would be reasonable.  Aneurysm screening may be the most useful test.

## 2018-04-01 ENCOUNTER — Other Ambulatory Visit: Payer: Self-pay | Admitting: Family Medicine

## 2018-04-01 ENCOUNTER — Telehealth: Payer: Self-pay

## 2018-04-01 NOTE — Telephone Encounter (Signed)
Author phoned pt. to follow-up on life line screening question, as well as referral to behavioral health. Dr. Elease Hashimoto did not think behavioral health would reach out to pt., but pt. Had to call them directly to set up an appointment. No answer, author left generic VM asking for return call.

## 2018-04-01 NOTE — Telephone Encounter (Signed)
Called patient and LMOVM.  Ok for [facility] to Discuss results / PCP / recommendations / Schedule patient  Called patient and left a message per Dr. Elease Hashimoto in regards to TE on 03/31/18

## 2018-04-09 ENCOUNTER — Other Ambulatory Visit: Payer: Self-pay | Admitting: Family Medicine

## 2018-04-10 NOTE — Telephone Encounter (Signed)
Pt returning call to [facility].  He understands she will try to call him back this afternoon.

## 2018-04-10 NOTE — Telephone Encounter (Addendum)
Author returned phone call. No answer; author left generic VM asking for return call to clarify need to reach out to behavioral health if needed based on conversation during awv.   04/10/18 at 402PM: Pt. returned call and author relayed above. Pt. stated he did not think he needed counseling at this point, but appreciative of follow-up and clarification. Pt. Denies any concerns at this time.

## 2018-05-06 ENCOUNTER — Encounter: Payer: Self-pay | Admitting: Family Medicine

## 2018-05-06 ENCOUNTER — Other Ambulatory Visit: Payer: Self-pay

## 2018-05-06 ENCOUNTER — Ambulatory Visit (INDEPENDENT_AMBULATORY_CARE_PROVIDER_SITE_OTHER): Payer: Medicare Other | Admitting: Family Medicine

## 2018-05-06 VITALS — BP 134/72 | HR 68 | Temp 98.2°F | Ht 66.25 in | Wt 169.4 lb

## 2018-05-06 DIAGNOSIS — L821 Other seborrheic keratosis: Secondary | ICD-10-CM

## 2018-05-06 DIAGNOSIS — R0789 Other chest pain: Secondary | ICD-10-CM

## 2018-05-06 DIAGNOSIS — L03032 Cellulitis of left toe: Secondary | ICD-10-CM | POA: Diagnosis not present

## 2018-05-06 MED ORDER — TRIAMCINOLONE ACETONIDE 0.1 % EX CREA: TOPICAL_CREAM | CUTANEOUS | 1 refills | Status: DC

## 2018-05-06 NOTE — Progress Notes (Signed)
Subjective:     Patient ID: Drew Smith, male   DOB: [birth year], 78 y.o.   MRN: [id number]  HPI Seen for the following issues  Left fourth toe irritation.  Has been sore intermittently for some time.  Denies any injury.  Feels slightly better today.  He has noticed some redness around the base of the nail.  No drainage.  No change of shoewear.  Scaly lesion right frontal and parietal scalp.  He has had some soreness left chest wall.  He has about 50 push-ups per day.  No exertional chest pain.  No dyspnea.  He has noticed soreness to touch around the fourth intercostal space.  Past Medical History:  Diagnosis Date  . Arthritis   . Asthma   . Cancer [facility])    prostate  . GERD (gastroesophageal reflux disease)   . Guillain-Barre syndrome ([place]) 2000  . Hyperlipidemia   . Hypertension    Past Surgical History:  Procedure Laterality Date  . EYE SURGERY  1954   78 years old  . HERNIA REPAIR  1995  . NOSE SURGERY  1990  . PROSTATE SURGERY  2000   seed inplants  . SHOULDER SURGERY  2008    reports that he has never smoked. He has never used smokeless tobacco. He reports current alcohol use of about 7.0 standard drinks of alcohol per week. He reports that he does not use drugs. family history includes Alcohol abuse in his father; Diabetes in his brother; Hyperlipidemia in his brother; Stroke in his brother. No Known Allergies   Review of Systems  Constitutional: Negative for fatigue.  Eyes: Negative for visual disturbance.  Respiratory: Negative for cough, chest tightness and shortness of breath.   Cardiovascular: Negative for palpitations and leg swelling.  Neurological: Negative for dizziness, syncope, weakness, light-headedness and headaches.       Objective:   Physical Exam Constitutional:      Appearance: He is well-developed.  HENT:     Right Ear: External ear normal.     Left Ear: External ear normal.  Eyes:     Pupils: Pupils are equal, round, and reactive  to light.  Neck:     Musculoskeletal: Neck supple.     Thyroid: No thyromegaly.  Cardiovascular:     Rate and Rhythm: Normal rate and regular rhythm.  Pulmonary:     Effort: Pulmonary effort is normal. No respiratory distress.     Breath sounds: Normal breath sounds. No wheezing or rales.     Comments: Mild tenderness left chest wall Skin:    Comments: Left fourth toe reveals some mild erythema on the base of the nail.  No purulence.  Minimally tender.  He has some diffuse thickening of several toenails.  No significant brittle changes  Patient has well-circumscribed brownish scaly approximately 6 mm minimally raised skin lesion right frontal scalp  Neurological:     Mental Status: He is alert and oriented to person, place, and time.        Assessment:     #1 benign-appearing seborrheic keratosis of the scalp  #2 probable chronic paronychia left fourth toe  #3 atypical chest pain.  Suspect musculoskeletal.  Reproducible soreness to touch.  Symptoms are relatively mild    Plan:     -Reassurance regarding scalp lesion -Recommend triamcinolone 0.1% cream twice daily to left fourth toe and touch base if not improving 2 to 3 weeks  -Consider backing off push-ups for now until chest wall pain improves  Elfa Wooton  Dan Europe MD [facility] at [facility]

## 2018-05-06 NOTE — Patient Instructions (Signed)
Try the steroid cream (triamcinolone) twice daily to affected toe.  Let me know in 2-3 weeks if not improving.    Suspect chronic paronychia.

## 2018-07-02 ENCOUNTER — Other Ambulatory Visit: Payer: Self-pay | Admitting: Family Medicine

## 2018-07-08 ENCOUNTER — Other Ambulatory Visit: Payer: Self-pay | Admitting: Family Medicine

## 2018-07-23 DIAGNOSIS — M5136 Other intervertebral disc degeneration, lumbar region: Secondary | ICD-10-CM | POA: Diagnosis not present

## 2018-07-23 DIAGNOSIS — M9905 Segmental and somatic dysfunction of pelvic region: Secondary | ICD-10-CM | POA: Diagnosis not present

## 2018-07-23 DIAGNOSIS — M9904 Segmental and somatic dysfunction of sacral region: Secondary | ICD-10-CM | POA: Diagnosis not present

## 2018-07-23 DIAGNOSIS — M9903 Segmental and somatic dysfunction of lumbar region: Secondary | ICD-10-CM | POA: Diagnosis not present

## 2018-07-29 DIAGNOSIS — M9905 Segmental and somatic dysfunction of pelvic region: Secondary | ICD-10-CM | POA: Diagnosis not present

## 2018-07-29 DIAGNOSIS — M9903 Segmental and somatic dysfunction of lumbar region: Secondary | ICD-10-CM | POA: Diagnosis not present

## 2018-07-29 DIAGNOSIS — M5136 Other intervertebral disc degeneration, lumbar region: Secondary | ICD-10-CM | POA: Diagnosis not present

## 2018-07-29 DIAGNOSIS — M9904 Segmental and somatic dysfunction of sacral region: Secondary | ICD-10-CM | POA: Diagnosis not present

## 2018-08-05 DIAGNOSIS — M9903 Segmental and somatic dysfunction of lumbar region: Secondary | ICD-10-CM | POA: Diagnosis not present

## 2018-08-05 DIAGNOSIS — M5134 Other intervertebral disc degeneration, thoracic region: Secondary | ICD-10-CM | POA: Diagnosis not present

## 2018-08-05 DIAGNOSIS — M5136 Other intervertebral disc degeneration, lumbar region: Secondary | ICD-10-CM | POA: Diagnosis not present

## 2018-08-05 DIAGNOSIS — M9902 Segmental and somatic dysfunction of thoracic region: Secondary | ICD-10-CM | POA: Diagnosis not present

## 2018-08-12 DIAGNOSIS — M5136 Other intervertebral disc degeneration, lumbar region: Secondary | ICD-10-CM | POA: Diagnosis not present

## 2018-08-12 DIAGNOSIS — M9903 Segmental and somatic dysfunction of lumbar region: Secondary | ICD-10-CM | POA: Diagnosis not present

## 2018-08-12 DIAGNOSIS — M5134 Other intervertebral disc degeneration, thoracic region: Secondary | ICD-10-CM | POA: Diagnosis not present

## 2018-08-12 DIAGNOSIS — M9902 Segmental and somatic dysfunction of thoracic region: Secondary | ICD-10-CM | POA: Diagnosis not present

## 2018-08-19 DIAGNOSIS — M9903 Segmental and somatic dysfunction of lumbar region: Secondary | ICD-10-CM | POA: Diagnosis not present

## 2018-08-19 DIAGNOSIS — M5136 Other intervertebral disc degeneration, lumbar region: Secondary | ICD-10-CM | POA: Diagnosis not present

## 2018-08-19 DIAGNOSIS — M5134 Other intervertebral disc degeneration, thoracic region: Secondary | ICD-10-CM | POA: Diagnosis not present

## 2018-08-19 DIAGNOSIS — M9902 Segmental and somatic dysfunction of thoracic region: Secondary | ICD-10-CM | POA: Diagnosis not present

## 2018-08-26 DIAGNOSIS — M9903 Segmental and somatic dysfunction of lumbar region: Secondary | ICD-10-CM | POA: Diagnosis not present

## 2018-08-26 DIAGNOSIS — M5136 Other intervertebral disc degeneration, lumbar region: Secondary | ICD-10-CM | POA: Diagnosis not present

## 2018-08-26 DIAGNOSIS — M5134 Other intervertebral disc degeneration, thoracic region: Secondary | ICD-10-CM | POA: Diagnosis not present

## 2018-08-26 DIAGNOSIS — M9902 Segmental and somatic dysfunction of thoracic region: Secondary | ICD-10-CM | POA: Diagnosis not present

## 2018-09-02 DIAGNOSIS — M5134 Other intervertebral disc degeneration, thoracic region: Secondary | ICD-10-CM | POA: Diagnosis not present

## 2018-09-02 DIAGNOSIS — M9903 Segmental and somatic dysfunction of lumbar region: Secondary | ICD-10-CM | POA: Diagnosis not present

## 2018-09-02 DIAGNOSIS — M9902 Segmental and somatic dysfunction of thoracic region: Secondary | ICD-10-CM | POA: Diagnosis not present

## 2018-09-02 DIAGNOSIS — M5136 Other intervertebral disc degeneration, lumbar region: Secondary | ICD-10-CM | POA: Diagnosis not present

## 2018-09-09 DIAGNOSIS — M5134 Other intervertebral disc degeneration, thoracic region: Secondary | ICD-10-CM | POA: Diagnosis not present

## 2018-09-09 DIAGNOSIS — M5136 Other intervertebral disc degeneration, lumbar region: Secondary | ICD-10-CM | POA: Diagnosis not present

## 2018-09-09 DIAGNOSIS — M9903 Segmental and somatic dysfunction of lumbar region: Secondary | ICD-10-CM | POA: Diagnosis not present

## 2018-09-09 DIAGNOSIS — M9902 Segmental and somatic dysfunction of thoracic region: Secondary | ICD-10-CM | POA: Diagnosis not present

## 2018-09-16 DIAGNOSIS — M5134 Other intervertebral disc degeneration, thoracic region: Secondary | ICD-10-CM | POA: Diagnosis not present

## 2018-09-16 DIAGNOSIS — M9903 Segmental and somatic dysfunction of lumbar region: Secondary | ICD-10-CM | POA: Diagnosis not present

## 2018-09-16 DIAGNOSIS — M5136 Other intervertebral disc degeneration, lumbar region: Secondary | ICD-10-CM | POA: Diagnosis not present

## 2018-09-16 DIAGNOSIS — M9902 Segmental and somatic dysfunction of thoracic region: Secondary | ICD-10-CM | POA: Diagnosis not present

## 2018-09-23 DIAGNOSIS — M9903 Segmental and somatic dysfunction of lumbar region: Secondary | ICD-10-CM | POA: Diagnosis not present

## 2018-09-23 DIAGNOSIS — M5134 Other intervertebral disc degeneration, thoracic region: Secondary | ICD-10-CM | POA: Diagnosis not present

## 2018-09-23 DIAGNOSIS — M5136 Other intervertebral disc degeneration, lumbar region: Secondary | ICD-10-CM | POA: Diagnosis not present

## 2018-09-23 DIAGNOSIS — M9902 Segmental and somatic dysfunction of thoracic region: Secondary | ICD-10-CM | POA: Diagnosis not present

## 2018-09-30 DIAGNOSIS — M9905 Segmental and somatic dysfunction of pelvic region: Secondary | ICD-10-CM | POA: Diagnosis not present

## 2018-09-30 DIAGNOSIS — M5136 Other intervertebral disc degeneration, lumbar region: Secondary | ICD-10-CM | POA: Diagnosis not present

## 2018-09-30 DIAGNOSIS — M9903 Segmental and somatic dysfunction of lumbar region: Secondary | ICD-10-CM | POA: Diagnosis not present

## 2018-09-30 DIAGNOSIS — M9904 Segmental and somatic dysfunction of sacral region: Secondary | ICD-10-CM | POA: Diagnosis not present

## 2018-10-06 ENCOUNTER — Other Ambulatory Visit: Payer: Self-pay | Admitting: Family Medicine

## 2018-10-07 DIAGNOSIS — M5136 Other intervertebral disc degeneration, lumbar region: Secondary | ICD-10-CM | POA: Diagnosis not present

## 2018-10-07 DIAGNOSIS — M9903 Segmental and somatic dysfunction of lumbar region: Secondary | ICD-10-CM | POA: Diagnosis not present

## 2018-10-07 DIAGNOSIS — M9904 Segmental and somatic dysfunction of sacral region: Secondary | ICD-10-CM | POA: Diagnosis not present

## 2018-10-07 DIAGNOSIS — M9905 Segmental and somatic dysfunction of pelvic region: Secondary | ICD-10-CM | POA: Diagnosis not present

## 2018-10-21 DIAGNOSIS — M9904 Segmental and somatic dysfunction of sacral region: Secondary | ICD-10-CM | POA: Diagnosis not present

## 2018-10-21 DIAGNOSIS — M9903 Segmental and somatic dysfunction of lumbar region: Secondary | ICD-10-CM | POA: Diagnosis not present

## 2018-10-21 DIAGNOSIS — M5136 Other intervertebral disc degeneration, lumbar region: Secondary | ICD-10-CM | POA: Diagnosis not present

## 2018-10-21 DIAGNOSIS — M9905 Segmental and somatic dysfunction of pelvic region: Secondary | ICD-10-CM | POA: Diagnosis not present

## 2018-10-28 DIAGNOSIS — M9904 Segmental and somatic dysfunction of sacral region: Secondary | ICD-10-CM | POA: Diagnosis not present

## 2018-10-28 DIAGNOSIS — M9905 Segmental and somatic dysfunction of pelvic region: Secondary | ICD-10-CM | POA: Diagnosis not present

## 2018-10-28 DIAGNOSIS — M9903 Segmental and somatic dysfunction of lumbar region: Secondary | ICD-10-CM | POA: Diagnosis not present

## 2018-10-28 DIAGNOSIS — M5136 Other intervertebral disc degeneration, lumbar region: Secondary | ICD-10-CM | POA: Diagnosis not present

## 2018-12-02 DIAGNOSIS — M9905 Segmental and somatic dysfunction of pelvic region: Secondary | ICD-10-CM | POA: Diagnosis not present

## 2018-12-02 DIAGNOSIS — M9903 Segmental and somatic dysfunction of lumbar region: Secondary | ICD-10-CM | POA: Diagnosis not present

## 2018-12-02 DIAGNOSIS — M9904 Segmental and somatic dysfunction of sacral region: Secondary | ICD-10-CM | POA: Diagnosis not present

## 2018-12-02 DIAGNOSIS — M5136 Other intervertebral disc degeneration, lumbar region: Secondary | ICD-10-CM | POA: Diagnosis not present

## 2018-12-09 DIAGNOSIS — M5136 Other intervertebral disc degeneration, lumbar region: Secondary | ICD-10-CM | POA: Diagnosis not present

## 2018-12-09 DIAGNOSIS — M9903 Segmental and somatic dysfunction of lumbar region: Secondary | ICD-10-CM | POA: Diagnosis not present

## 2018-12-09 DIAGNOSIS — M9905 Segmental and somatic dysfunction of pelvic region: Secondary | ICD-10-CM | POA: Diagnosis not present

## 2018-12-09 DIAGNOSIS — M9904 Segmental and somatic dysfunction of sacral region: Secondary | ICD-10-CM | POA: Diagnosis not present

## 2018-12-10 ENCOUNTER — Other Ambulatory Visit: Payer: Self-pay | Admitting: Family Medicine

## 2018-12-16 DIAGNOSIS — M9904 Segmental and somatic dysfunction of sacral region: Secondary | ICD-10-CM | POA: Diagnosis not present

## 2018-12-16 DIAGNOSIS — M9905 Segmental and somatic dysfunction of pelvic region: Secondary | ICD-10-CM | POA: Diagnosis not present

## 2018-12-16 DIAGNOSIS — M5136 Other intervertebral disc degeneration, lumbar region: Secondary | ICD-10-CM | POA: Diagnosis not present

## 2018-12-16 DIAGNOSIS — M9903 Segmental and somatic dysfunction of lumbar region: Secondary | ICD-10-CM | POA: Diagnosis not present

## 2018-12-23 DIAGNOSIS — M9903 Segmental and somatic dysfunction of lumbar region: Secondary | ICD-10-CM | POA: Diagnosis not present

## 2018-12-23 DIAGNOSIS — M9904 Segmental and somatic dysfunction of sacral region: Secondary | ICD-10-CM | POA: Diagnosis not present

## 2018-12-23 DIAGNOSIS — M9905 Segmental and somatic dysfunction of pelvic region: Secondary | ICD-10-CM | POA: Diagnosis not present

## 2018-12-23 DIAGNOSIS — M5136 Other intervertebral disc degeneration, lumbar region: Secondary | ICD-10-CM | POA: Diagnosis not present

## 2018-12-28 DIAGNOSIS — D485 Neoplasm of uncertain behavior of skin: Secondary | ICD-10-CM | POA: Diagnosis not present

## 2018-12-28 DIAGNOSIS — L821 Other seborrheic keratosis: Secondary | ICD-10-CM | POA: Diagnosis not present

## 2018-12-28 DIAGNOSIS — L814 Other melanin hyperpigmentation: Secondary | ICD-10-CM | POA: Diagnosis not present

## 2018-12-28 DIAGNOSIS — L218 Other seborrheic dermatitis: Secondary | ICD-10-CM | POA: Diagnosis not present

## 2018-12-28 DIAGNOSIS — L570 Actinic keratosis: Secondary | ICD-10-CM | POA: Diagnosis not present

## 2018-12-28 DIAGNOSIS — D229 Melanocytic nevi, unspecified: Secondary | ICD-10-CM | POA: Diagnosis not present

## 2018-12-28 DIAGNOSIS — L819 Disorder of pigmentation, unspecified: Secondary | ICD-10-CM | POA: Diagnosis not present

## 2018-12-29 DIAGNOSIS — C44319 Basal cell carcinoma of skin of other parts of face: Secondary | ICD-10-CM | POA: Diagnosis not present

## 2019-01-04 ENCOUNTER — Other Ambulatory Visit: Payer: Self-pay | Admitting: Family Medicine

## 2019-01-13 DIAGNOSIS — M9905 Segmental and somatic dysfunction of pelvic region: Secondary | ICD-10-CM | POA: Diagnosis not present

## 2019-01-13 DIAGNOSIS — M5136 Other intervertebral disc degeneration, lumbar region: Secondary | ICD-10-CM | POA: Diagnosis not present

## 2019-01-13 DIAGNOSIS — M9903 Segmental and somatic dysfunction of lumbar region: Secondary | ICD-10-CM | POA: Diagnosis not present

## 2019-01-13 DIAGNOSIS — M9904 Segmental and somatic dysfunction of sacral region: Secondary | ICD-10-CM | POA: Diagnosis not present

## 2019-02-03 DIAGNOSIS — M9905 Segmental and somatic dysfunction of pelvic region: Secondary | ICD-10-CM | POA: Diagnosis not present

## 2019-02-03 DIAGNOSIS — M5136 Other intervertebral disc degeneration, lumbar region: Secondary | ICD-10-CM | POA: Diagnosis not present

## 2019-02-03 DIAGNOSIS — M9903 Segmental and somatic dysfunction of lumbar region: Secondary | ICD-10-CM | POA: Diagnosis not present

## 2019-02-03 DIAGNOSIS — M9904 Segmental and somatic dysfunction of sacral region: Secondary | ICD-10-CM | POA: Diagnosis not present

## 2019-02-24 DIAGNOSIS — M9904 Segmental and somatic dysfunction of sacral region: Secondary | ICD-10-CM | POA: Diagnosis not present

## 2019-02-24 DIAGNOSIS — M9905 Segmental and somatic dysfunction of pelvic region: Secondary | ICD-10-CM | POA: Diagnosis not present

## 2019-02-24 DIAGNOSIS — M5136 Other intervertebral disc degeneration, lumbar region: Secondary | ICD-10-CM | POA: Diagnosis not present

## 2019-02-24 DIAGNOSIS — M9903 Segmental and somatic dysfunction of lumbar region: Secondary | ICD-10-CM | POA: Diagnosis not present

## 2019-03-02 DIAGNOSIS — L905 Scar conditions and fibrosis of skin: Secondary | ICD-10-CM | POA: Diagnosis not present

## 2019-03-02 DIAGNOSIS — C44319 Basal cell carcinoma of skin of other parts of face: Secondary | ICD-10-CM | POA: Diagnosis not present

## 2019-03-25 ENCOUNTER — Other Ambulatory Visit: Payer: Self-pay

## 2019-03-26 ENCOUNTER — Ambulatory Visit (INDEPENDENT_AMBULATORY_CARE_PROVIDER_SITE_OTHER): Payer: Medicare Other

## 2019-03-26 ENCOUNTER — Telehealth: Payer: Self-pay

## 2019-03-26 VITALS — BP 142/64 | Temp 97.8°F | Ht 66.0 in | Wt 165.8 lb

## 2019-03-26 DIAGNOSIS — Z0000 Encounter for general adult medical examination without abnormal findings: Secondary | ICD-10-CM

## 2019-03-26 DIAGNOSIS — H9190 Unspecified hearing loss, unspecified ear: Secondary | ICD-10-CM | POA: Diagnosis not present

## 2019-03-26 NOTE — Patient Instructions (Addendum)
[name] , Thank you for taking time to come for your Medicare Wellness Visit. I appreciate your ongoing commitment to your health goals. Please review the following plan we discussed and let me know if I can assist you in the future.   Screening recommendations/referrals: Colorectal Screening: cologuard completed in 03/28/2016; aged out.   Vision and Dental Exams: Recommended annual ophthalmology exams for early detection of glaucoma and other disorders of the eye. Scheduled for eye exam in Feb 2021. Recommended annual dental exams for proper oral hygiene. Patient does see a dentist 2 x year.  Diabetic Exams: Diabetic Eye Exam: N/A Diabetic Foot Exam: N/A  Vaccinations: Influenza vaccine: hx Guillain-Barre and does not take.  Pneumococcal vaccine:hx Guillain-Barre and does not take.  Tdap vaccine: hx Guillain-Barre and does not take. Shingles vaccine: hx Guillain-Barre and does not take.  Advanced directives: Advance directives discussed with you today. Please bring a copy of your POA (Power of [place]) and/or Living Will to your next appointment.  Goals: Recommend to drink at least 6-8 8oz glasses of water per day.  Recommend to exercise for at least 150 minutes per week.  Recommend to remove any items from the home that may cause slips or trips.  Next appointment: Please schedule your Annual Wellness Visit with your Nurse Health Advisor in one year.  Preventive Care 27 Years and Older, Male Preventive care refers to lifestyle choices and visits with your health care provider that can promote health and wellness. What does preventive care include?  A yearly physical exam. This is also called an annual well check.  Dental exams once or twice a year.  Routine eye exams. Ask your health care provider how often you should have your eyes checked.  Personal lifestyle choices, including:  Daily care of your teeth and gums.  Regular physical activity.  Eating a healthy  diet.  Avoiding tobacco and drug use.  Limiting alcohol use.  Practicing safe sex.  Taking low doses of aspirin every day if recommended by your health care provider..  Taking vitamin and mineral supplements as recommended by your health care provider. What happens during an annual well check? The services and screenings done by your health care provider during your annual well check will depend on your age, overall health, lifestyle risk factors, and family history of disease. Counseling  Your health care provider may ask you questions about your:  Alcohol use.  Tobacco use.  Drug use.  Emotional well-being.  Home and relationship well-being.  Sexual activity.  Eating habits.  History of falls.  Memory and ability to understand (cognition).  Work and work Statistician. Screening  You may have the following tests or measurements:  Height, weight, and BMI.  Blood pressure.  Lipid and cholesterol levels. These may be checked every 5 years, or more frequently if you are over 72 years old.  Skin check.  Lung cancer screening. You may have this screening every year starting at age 54 if you have a 30-pack-year history of smoking and currently smoke or have quit within the past 15 years.  Fecal occult blood test (FOBT) of the stool. You may have this test every year starting at age 69.  Flexible sigmoidoscopy or colonoscopy. You may have a sigmoidoscopy every 5 years or a colonoscopy every 10 years starting at age 48.  Prostate cancer screening. Recommendations will vary depending on your family history and other risks.  Hepatitis C blood test.  Hepatitis B blood test.  Sexually transmitted disease (  STD) testing.  Diabetes screening. This is done by checking your blood sugar (glucose) after you have not eaten for a while (fasting). You may have this done every 1-3 years.  Abdominal aortic aneurysm (AAA) screening. You may need this if you are a current or former  smoker.  Osteoporosis. You may be screened starting at age 33 if you are at high risk. Talk with your health care provider about your test results, treatment options, and if necessary, the need for more tests. Vaccines  Your health care provider may recommend certain vaccines, such as:  Influenza vaccine. This is recommended every year.  Tetanus, diphtheria, and acellular pertussis (Tdap, Td) vaccine. You may need a Td booster every 10 years.  Zoster vaccine. You may need this after age 74.  Pneumococcal 13-valent conjugate (PCV13) vaccine. One dose is recommended after age 60.  Pneumococcal polysaccharide (PPSV23) vaccine. One dose is recommended after age 62. Talk to your health care provider about which screenings and vaccines you need and how often you need them. This information is not intended to replace advice given to you by your health care provider. Make sure you discuss any questions you have with your health care provider. Document Released: 03/10/2015 Document Revised: 11/01/2015 Document Reviewed: 12/13/2014 Elsevier Interactive Patient Education  2017 [place] Prevention in the Home Falls can cause injuries. They can happen to people of all ages. There are many things you can do to make your home safe and to help prevent falls. What can I do on the outside of my home?  Regularly fix the edges of walkways and driveways and fix any cracks.  Remove anything that might make you trip as you walk through a door, such as a raised step or threshold.  Trim any bushes or trees on the path to your home.  Use bright outdoor lighting.  Clear any walking paths of anything that might make someone trip, such as rocks or tools.  Regularly check to see if handrails are loose or broken. Make sure that both sides of any steps have handrails.  Any raised decks and porches should have guardrails on the edges.  Have any leaves, snow, or ice cleared regularly.  Use sand or  salt on walking paths during winter.  Clean up any spills in your garage right away. This includes oil or grease spills. What can I do in the bathroom?  Use night lights.  Install grab bars by the toilet and in the tub and shower. Do not use towel bars as grab bars.  Use non-skid mats or decals in the tub or shower.  If you need to sit down in the shower, use a plastic, non-slip stool.  Keep the floor dry. Clean up any water that spills on the floor as soon as it happens.  Remove soap buildup in the tub or shower regularly.  Attach bath mats securely with double-sided non-slip rug tape.  Do not have throw rugs and other things on the floor that can make you trip. What can I do in the bedroom?  Use night lights.  Make sure that you have a light by your bed that is easy to reach.  Do not use any sheets or blankets that are too big for your bed. They should not hang down onto the floor.  Have a firm chair that has side arms. You can use this for support while you get dressed.  Do not have throw rugs and other things on the  floor that can make you trip. What can I do in the kitchen?  Clean up any spills right away.  Avoid walking on wet floors.  Keep items that you use a lot in easy-to-reach places.  If you need to reach something above you, use a strong step stool that has a grab bar.  Keep electrical cords out of the way.  Do not use floor polish or wax that makes floors slippery. If you must use wax, use non-skid floor wax.  Do not have throw rugs and other things on the floor that can make you trip. What can I do with my stairs?  Do not leave any items on the stairs.  Make sure that there are handrails on both sides of the stairs and use them. Fix handrails that are broken or loose. Make sure that handrails are as long as the stairways.  Check any carpeting to make sure that it is firmly attached to the stairs. Fix any carpet that is loose or worn.  Avoid having  throw rugs at the top or bottom of the stairs. If you do have throw rugs, attach them to the floor with carpet tape.  Make sure that you have a light switch at the top of the stairs and the bottom of the stairs. If you do not have them, ask someone to add them for you. What else can I do to help prevent falls?  Wear shoes that:  Do not have high heels.  Have rubber bottoms.  Are comfortable and fit you well.  Are closed at the toe. Do not wear sandals.  If you use a stepladder:  Make sure that it is fully opened. Do not climb a closed stepladder.  Make sure that both sides of the stepladder are locked into place.  Ask someone to hold it for you, if possible.  Clearly mark and make sure that you can see:  Any grab bars or handrails.  First and last steps.  Where the edge of each step is.  Use tools that help you move around (mobility aids) if they are needed. These include:  Canes.  Walkers.  Scooters.  Crutches.  Turn on the lights when you go into a dark area. Replace any light bulbs as soon as they burn out.  Set up your furniture so you have a clear path. Avoid moving your furniture around.  If any of your floors are uneven, fix them.  If there are any pets around you, be aware of where they are.  Review your medicines with your doctor. Some medicines can make you feel dizzy. This can increase your chance of falling. Ask your doctor what other things that you can do to help prevent falls. This information is not intended to replace advice given to you by your health care provider. Make sure you discuss any questions you have with your health care provider. Document Released: 12/08/2008 Document Revised: 07/20/2015 Document Reviewed: 03/18/2014 Elsevier Interactive Patient Education  2017 [employer].

## 2019-03-26 NOTE — Telephone Encounter (Signed)
error 

## 2019-03-26 NOTE — Progress Notes (Signed)
Subjective:   Drew Smith is a 79 y.o. male who presents for Medicare Annual/Subsequent preventive examination.  Drew Smith is doing well at this time. He performs stretching exercises every morning and plays golf when he can. He eats healthy, balanced meals as well. He does complain of ringing in his ears and decreased hearing. He also states he is concerned about driving because he is not seeing as well as he used to and occasionally has dizziness. During the wellness visit, he exhibited a frequent, dry cough as well which he stated was due to allergies or a "touch of asthma".  Review of Systems:  No ROS; Annual Medicare Wellness Visit  Cardiac Risk Factors include: advanced age (>65men, >43 women);hypertension;male gender;sedentary lifestyle     Objective:    Vitals: BP (!) 142/64 (BP Location: Left Arm, Patient Position: Sitting)   Temp 97.8 F (36.6 C) (Temporal)   Ht 5\' 6"  (1.676 m)   Wt 165 lb 12.8 oz (75.2 kg)   BMI 26.76 kg/m   Body mass index is 26.76 kg/m.  Advanced Directives 03/20/2018 03/19/2017  Does Patient Have a Medical Advance Directive? No;Yes No  Type of Paramedic of [place];Living will -  Does patient want to make changes to medical advance directive? Yes (MAU/Ambulatory/Procedural Areas - Information given) -  Copy of [place] in Chart? No - copy requested -    Tobacco Social History   Tobacco Use  Smoking Status Never Smoker  Smokeless Tobacco Never Used     Counseling given: Not Answered   Clinical Intake:  Pre-visit preparation completed: Yes  Pain : No/denies pain     BMI - recorded: 26.76 Nutritional Status: BMI 25 -29 Overweight Nutritional Risks: None Diabetes: No  How often do you need to have someone help you when you read instructions, pamphlets, or other written materials from your doctor or pharmacy?: 1 - Never What is the last grade level you completed in school?: some  college  Interpreter Needed?: No  Information entered by :: Franne Forts, LPN.  Past Medical History:  Diagnosis Date  . Arthritis   . Asthma   . Cancer [facility])    prostate  . GERD (gastroesophageal reflux disease)   . Guillain-Barre syndrome ([place]) 2000  . Hyperlipidemia   . Hypertension    Past Surgical History:  Procedure Laterality Date  . EYE SURGERY  3954   79 years old  . HERNIA REPAIR  1995  . NOSE SURGERY  1990  . PROSTATE SURGERY  2000   seed inplants  . SHOULDER SURGERY  2008   Family History  Problem Relation Age of Onset  . Hyperlipidemia Brother   . Stroke Brother   . Diabetes Brother        type ll  . Alcohol abuse Father    Social History   Socioeconomic History  . Marital status: Divorced    Spouse name: Not on file  . Number of children: 3  . Years of education: Not on file  . Highest education level: Some college, no degree  Occupational History  . Occupation: retired  Tobacco Use  . Smoking status: Never Smoker  . Smokeless tobacco: Never Used  Substance and Sexual Activity  . Alcohol use: Yes    Alcohol/week: 7.0 standard drinks    Types: 7 Glasses of wine per week    Comment: have 1/2 a day; wine or other   . Drug use: No  . Sexual  activity: Not Currently  Other Topics Concern  . Not on file  Social History Narrative   Lives alone in one level. Has two sons 1 daughter and friends who serve as support.   Former Chiropodist; Enjoys Data processing manager, reading   Originally from [place]   Social Determinants of Health   Financial Resource Strain: [place]   . Difficulty of Paying Living Expenses: Not hard at all  Food Insecurity: No Food Insecurity  . Worried About Charity fundraiser in the Last Year: Never true  . Ran Out of Food in the Last Year: Never true  Transportation Needs: No Transportation Needs  . Lack of Transportation (Medical): No  . Lack of Transportation (Non-Medical): No  Physical Activity: Sufficiently Active  .  Days of Exercise per Week: 7 days  . Minutes of Exercise per Session: 30 min  Stress: Stress Concern Present  . Feeling of Stress : Rather much  Social Connections: Unknown  . Frequency of Communication with Friends and Family: More than three times a week  . Frequency of Social Gatherings with Friends and Family: Once a week  . Attends Religious Services: Not on file  . Active Member of Clubs or Organizations: No  . Attends Archivist Meetings: Never  . Marital Status: Divorced    Outpatient Encounter Medications as of 03/26/2019  Medication Sig  . acyclovir (ZOVIRAX) 400 MG tablet TAKE 1 TABLET (400 MG TOTAL) BY MOUTH AS NEEDED.  [name] amLODipine (NORVASC) 5 MG tablet TAKE ONE TABLET BY MOUTH DAILY  . ciclopirox (LOPROX) 0.77 % cream Apply topically 2 (two) times daily.  . diphenhydrAMINE-zinc acetate (BENADRYL) cream Apply 1 application topically daily as needed for itching.  . fluorouracil (EFUDEX) 5 % cream Apply topically as needed. Per Dr Joaquim Lai  . hydrochlorothiazide (MICROZIDE) 12.5 MG capsule TAKE ONE CAPSULE BY MOUTH DAILY  . ibuprofen (ADVIL,MOTRIN) 600 MG tablet Only use 800 mg  8 hours sparingly. (Patient taking differently: Take 600 mg by mouth every 6 (six) hours as needed. Only use 800 mg  8 hours sparingly.)  . levocetirizine (XYZAL) 5 MG tablet TAKE ONE TABLET BY MOUTH EVERY EVENING  . omeprazole (PRILOSEC) 20 MG capsule TAKE ONE CAPSULE BY MOUTH TWICE A DAY  . Oxymetazoline HCl (NASAL SPRAY) 0.05 % SOLN Place into the nose.  . sildenafil (REVATIO) 20 MG tablet TAKE 2 TO 5 TABLETS BY MOUTH 1 HOUR PRIOR TO SEXUAL ACTIVITY  . triamcinolone cream (KENALOG) 0.1 % APPLY TO AFFECTED AREA(S) TWO TIMES A DAY  . azelastine (ASTELIN) 0.1 % nasal spray Place 1 spray into both nostrils 2 (two) times daily. Use in each nostril as directed (Patient not taking: Reported on 03/26/2019)  . VENTOLIN HFA 108 (90 Base) MCG/ACT inhaler INHALE TWO PUFFS BY MOUTH EVERY 6 HOURS AS NEEDED  FOR WHEEZING OR SHORTNESS OF BREATH (Patient not taking: Reported on 03/26/2019)   No facility-administered encounter medications on file as of 03/26/2019.    Activities of Daily Living In your present state of health, do you have any difficulty performing the following activities: 03/26/2019 03/31/2018  Hearing? Y N  Vision? Y N  Difficulty concentrating or making decisions? N N  Walking or climbing stairs? N N  Dressing or bathing? N N  Doing errands, shopping? N N  Preparing Food and eating ? N -  Using the Toilet? N -  In the past six months, have you accidently leaked urine? N -  Do you have problems with  loss of bowel control? N -  Managing your Medications? N -  Managing your Finances? N -  Housekeeping or managing your Housekeeping? N -  Some recent data might be hidden    Patient Care Team: [name], MD as PCP - General (Family Medicine) Stephannie Li, [place] (Ophthalmology)   Assessment:   This is a routine wellness examination for Drew Smith.  Exercise Activities and Dietary recommendations Current Exercise Habits: Home exercise routine, Type of exercise: stretching;walking;Other - see comments(golf), Time (Minutes): 30, Frequency (Times/Week): 7, Weekly Exercise (Minutes/Week): 210, Intensity: Mild, Exercise limited by: None identified  Goals    . Patient Stated     Continue to stay healthy and visit [place] when pandemic settles    . Patient Stated     Resume dancing when pandemic settles        Fall Risk Fall Risk  03/26/2019 03/20/2018 03/19/2017 03/19/2017 06/27/2015  Falls in the past year? 0 0 No No No  Number falls in past yr: - 0 - - -  Risk for fall due to : Medication side effect - - - -  Follow up Falls evaluation completed;Education provided;Falls prevention discussed Education provided - - -   Is the patient's home free of loose throw rugs in walkways, pet beds, electrical cords, etc?   yes      Grab bars in the bathroom? yes      Handrails on  the stairs?   yes      Adequate lighting?   yes  Timed Get Up and Go Performed: Normal  Depression Screen PHQ 2/9 Scores 03/26/2019 03/31/2018 03/20/2018 03/19/2017  PHQ - 2 Score 0 0 2 0  PHQ- 9 Score - 0 3 -    Cognitive Function MMSE - Mini Mental State Exam 03/20/2018  Orientation to time 5  Orientation to Place 5  Registration 3  Attention/ Calculation 5  Recall 3  Language- name 2 objects 2  Language- repeat 1  Language- follow 3 step command 3  Language- read & follow direction 1  Write a sentence 1  Copy design 1  Total score 30     6CIT Screen 03/26/2019  What Year? 0 points  What month? 0 points  What time? 0 points  Count back from 20 0 points  Months in reverse 0 points  Repeat phrase 0 points  Total Score 0    Immunization History  Administered Date(s) Administered  . Pneumococcal Polysaccharide-23 11/28/2010    Qualifies for Shingles Vaccine?hx Guillain-Barre and does not take.   Screening Tests Health Maintenance  Topic Date Due  . TETANUS/TDAP  03/04/2040 (Originally 08/29/1959)  . INFLUENZA VACCINE  Discontinued   Cancer Screenings: Lung: Low Dose CT Chest recommended if Age 62-80 years, 30 pack-year currently smoking OR have quit w/in 15years. Patient does not qualify. Colorectal: yes   Additional Screenings:  Hepatitis C Screening:N/A due to age.     Plan:   Mr. Haenel declines taking immunizations due to hx Guillain-Barre. Referral was made to audiology for hearing evaluation.   I have personally reviewed and noted the following in the patient's chart:   . Medical and social history . Use of alcohol, tobacco or illicit drugs  . Current medications and supplements . Functional ability and status . Nutritional status . Physical activity . Advanced directives . List of other physicians . Hospitalizations, surgeries, and ER visits in previous 12 months . Vitals . Screenings to include cognitive, depression, and falls . Referrals and  appointments  In addition, I have reviewed and discussed with patient certain preventive protocols, quality metrics, and best practice recommendations. A written personalized care plan for preventive services as well as general preventive health recommendations were provided to patient.     Franne Forts, LPN  [phone]

## 2019-04-05 ENCOUNTER — Ambulatory Visit (INDEPENDENT_AMBULATORY_CARE_PROVIDER_SITE_OTHER): Payer: Medicare Other | Admitting: Family Medicine

## 2019-04-05 ENCOUNTER — Encounter: Payer: Self-pay | Admitting: Family Medicine

## 2019-04-05 ENCOUNTER — Other Ambulatory Visit: Payer: Self-pay

## 2019-04-05 VITALS — BP 122/68 | HR 70 | Temp 97.2°F | Ht 66.25 in | Wt 165.0 lb

## 2019-04-05 DIAGNOSIS — G8929 Other chronic pain: Secondary | ICD-10-CM | POA: Diagnosis not present

## 2019-04-05 DIAGNOSIS — M25511 Pain in right shoulder: Secondary | ICD-10-CM

## 2019-04-05 DIAGNOSIS — Z0000 Encounter for general adult medical examination without abnormal findings: Secondary | ICD-10-CM | POA: Diagnosis not present

## 2019-04-05 LAB — BASIC METABOLIC PANEL
BUN: 18 mg/dL (ref 6–23)
CO2: 29 mEq/L (ref 19–32)
Calcium: 9 mg/dL (ref 8.4–10.5)
Chloride: 102 mEq/L (ref 96–112)
Creatinine, Ser: 0.99 mg/dL (ref 0.40–1.50)
GFR: 72.81 mL/min (ref >60.00)
Glucose, Bld: 107 mg/dL — ABNORMAL HIGH (ref 70–99)
Potassium: 4 mEq/L (ref 3.5–5.1)
Sodium: 138 mEq/L (ref 135–145)

## 2019-04-05 LAB — CBC WITH DIFFERENTIAL/PLATELET
Basophils Absolute: 0 10*3/uL (ref 0.0–0.1)
Basophils Relative: 0.2 % (ref 0.0–3.0)
Eosinophils Absolute: 0.3 10*3/uL (ref 0.0–0.7)
Eosinophils Relative: 4.4 % (ref 0.0–5.0)
HCT: 43.3 % (ref 39.0–52.0)
Hemoglobin: 14.9 g/dL (ref 13.0–17.0)
Lymphocytes Relative: 24 % (ref 12.0–46.0)
Lymphs Abs: 1.5 10*3/uL (ref 0.7–4.0)
MCHC: 34.4 g/dL (ref 30.0–36.0)
MCV: 93.7 fl (ref 78.0–100.0)
Monocytes Absolute: 0.8 10*3/uL (ref 0.1–1.0)
Monocytes Relative: 11.9 % (ref 3.0–12.0)
Neutro Abs: 3.8 10*3/uL (ref 1.4–7.7)
Neutrophils Relative %: 59.5 % (ref 43.0–77.0)
Platelets: 233 10*3/uL (ref 150.0–400.0)
RBC: 4.62 Mil/uL (ref 4.22–5.81)
RDW: 13.3 % (ref 11.5–15.5)
WBC: 6.3 10*3/uL (ref 4.0–10.5)

## 2019-04-05 LAB — HEPATIC FUNCTION PANEL
ALT: 12 U/L (ref 0–53)
AST: 14 U/L (ref 0–37)
Albumin: 4 g/dL (ref 3.5–5.2)
Alkaline Phosphatase: 58 U/L (ref 39–117)
Bilirubin, Direct: 0.1 mg/dL (ref 0.0–0.3)
Total Bilirubin: 0.5 mg/dL (ref 0.2–1.2)
Total Protein: 6.7 g/dL (ref 6.0–8.3)

## 2019-04-05 LAB — LIPID PANEL
Cholesterol: 163 mg/dL (ref 0–200)
HDL: 47.6 mg/dL (ref >39.00)
LDL Cholesterol: 95 mg/dL (ref 0–99)
NonHDL: 115.67
Total CHOL/HDL Ratio: 3
Triglycerides: 103 mg/dL (ref 0.0–149.0)
VLDL: 20.6 mg/dL (ref 0.0–40.0)

## 2019-04-05 LAB — TSH: TSH: 2.96 u[IU]/mL (ref 0.35–4.50)

## 2019-04-05 NOTE — Progress Notes (Signed)
Subjective:     Patient ID: Drew Smith, male   DOB: 24-May-1940, 79 y.o.   MRN: [id number]  HPI   Drew Smith is here for physical exam.  His chronic problems include hypertension, history of GERD, history of squamous cell carcinoma skin, hyperlipidemia, history of Guillain-Barr syndrome, past history of prostate cancer  He continues to stay fairly active.  Has not been playing quite as much golf recently.  He is reluctant at this point and still undecided whether to get Covid vaccination because of his Guillain-Barr history.  Major complaint currently is progressive right shoulder pain for apparently several years.  He played professional hockey for several years and thinks he may be having some problems stemming from that.  He has pain with abduction.  He does some modified push-ups without difficulty but has pain when reaching overhead.  He would like to have this looked at by sports medicine.  He does not get tetanus or flu vaccines because of his [place] history.  He went for recent [facility] screening.  He had no evidence for peripheral vascular disease, osteoporosis, or abdominal aortic aneurysm.  Past Medical History:  Diagnosis Date  . Arthritis   . Asthma   . Cancer [facility])    prostate  . GERD (gastroesophageal reflux disease)   . Guillain-Barre syndrome ([place]) 2000  . Hyperlipidemia   . Hypertension    Past Surgical History:  Procedure Laterality Date  . EYE SURGERY  2167   79 years old  . HERNIA REPAIR  1995  . NOSE SURGERY  1990  . PROSTATE SURGERY  2000   seed inplants  . SHOULDER SURGERY  2008    reports that he has never smoked. He has never used smokeless tobacco. He reports current alcohol use of about 7.0 standard drinks of alcohol per week. He reports that he does not use drugs. family history includes Alcohol abuse in his father; Diabetes in his brother; Hyperlipidemia in his brother; Stroke in his brother. No Known Allergies   Review of  Systems  Constitutional: Negative for activity change, appetite change, fatigue, fever and unexpected weight change.  HENT: Negative for congestion, ear pain and trouble swallowing.   Eyes: Negative for pain and visual disturbance.  Respiratory: Negative for cough, shortness of breath and wheezing.   Cardiovascular: Negative for chest pain and palpitations.  Gastrointestinal: Negative for abdominal distention, abdominal pain, blood in stool, constipation, diarrhea, nausea, rectal pain and vomiting.  Endocrine: Negative for polydipsia and polyuria.  Genitourinary: Negative for dysuria, hematuria and testicular pain.  Musculoskeletal: Positive for arthralgias. Negative for joint swelling.  Skin: Negative for rash.  Neurological: Negative for dizziness, syncope and headaches.  Hematological: Negative for adenopathy.  Psychiatric/Behavioral: Negative for confusion and dysphoric mood.       Objective:   Physical Exam Constitutional:      General: He is not in acute distress.    Appearance: He is well-developed.  HENT:     Head: Normocephalic and atraumatic.     Right Ear: External ear normal.     Left Ear: External ear normal.  Eyes:     Conjunctiva/sclera: Conjunctivae normal.     Pupils: Pupils are equal, round, and reactive to light.  Neck:     Thyroid: No thyromegaly.  Cardiovascular:     Rate and Rhythm: Normal rate and regular rhythm.     Heart sounds: Normal heart sounds. No murmur.  Pulmonary:     Effort: No respiratory distress.  Breath sounds: No wheezing or rales.  Abdominal:     General: Bowel sounds are normal. There is no distension.     Palpations: Abdomen is soft. There is no mass.     Tenderness: There is no abdominal tenderness. There is no guarding or rebound.  Musculoskeletal:     Cervical back: Normal range of motion and neck supple.     Comments: Right shoulder reveals no specific point tenderness.  He has fairly well-preserved range of motion.  He does  have some pain with abduction against resistance.  Lymphadenopathy:     Cervical: No cervical adenopathy.  Skin:    Findings: No rash.  Neurological:     Mental Status: He is alert and oriented to person, place, and time.     Cranial Nerves: No cranial nerve deficit.        Assessment:     Physical exam.  We discussed the following health maintenance issues.  He has had some progressive right shoulder pains for several years and this is starting to impair some of his activities such as golf    Plan:     -We discussed Covid vaccine.  He had questions because of his Guillain-Barr history.  He is still undecided whether he will pursue that  -Obtain follow-up screening labs  -Set up sports medicine referral  Drew Post MD [facility]

## 2019-04-05 NOTE — Patient Instructions (Signed)
Preventive Care 80 Years and Older, Male Preventive care refers to lifestyle choices and visits with your health care provider that can promote health and wellness. This includes:  A yearly physical exam. This is also called an annual well check.  Regular dental and eye exams.  Immunizations.  Screening for certain conditions.  Healthy lifestyle choices, such as diet and exercise. What can I expect for my preventive care visit? Physical exam Your health care provider will check:  Height and weight. These may be used to calculate body mass index (BMI), which is a measurement that tells if you are at a healthy weight.  Heart rate and blood pressure.  Your skin for abnormal spots. Counseling Your health care provider may ask you questions about:  Alcohol, tobacco, and drug use.  Emotional well-being.  Home and relationship well-being.  Sexual activity.  Eating habits.  History of falls.  Memory and ability to understand (cognition).  Work and work Statistician. What immunizations do I need?  Influenza (flu) vaccine  This is recommended every year. Tetanus, diphtheria, and pertussis (Tdap) vaccine  You may need a Td booster every 10 years. Varicella (chickenpox) vaccine  You may need this vaccine if you have not already been vaccinated. Zoster (shingles) vaccine  You may need this after age 80. Pneumococcal conjugate (PCV13) vaccine  One dose is recommended after age 80. Pneumococcal polysaccharide (PPSV23) vaccine  One dose is recommended after age 2. Measles, mumps, and rubella (MMR) vaccine  You may need at least one dose of MMR if you were born in 1957 or later. You may also need a second dose. Meningococcal conjugate (MenACWY) vaccine  You may need this if you have certain conditions. Hepatitis A vaccine  You may need this if you have certain conditions or if you travel or work in places where you may be exposed to hepatitis A. Hepatitis B  vaccine  You may need this if you have certain conditions or if you travel or work in places where you may be exposed to hepatitis B. Haemophilus influenzae type b (Hib) vaccine  You may need this if you have certain conditions. You may receive vaccines as individual doses or as more than one vaccine together in one shot (combination vaccines). Talk with your health care provider about the risks and benefits of combination vaccines. What tests do I need? Blood tests  Lipid and cholesterol levels. These may be checked every 5 years, or more frequently depending on your overall health.  Hepatitis C test.  Hepatitis B test. Screening  Lung cancer screening. You may have this screening every year starting at age 80 if you have a 30-pack-year history of smoking and currently smoke or have quit within the past 15 years.  Colorectal cancer screening. All adults should have this screening starting at age 80 and continuing until age 47. Your health care provider may recommend screening at age 30 if you are at increased risk. You will have tests every 1-10 years, depending on your results and the type of screening test.  Prostate cancer screening. Recommendations will vary depending on your family history and other risks.  Diabetes screening. This is done by checking your blood sugar (glucose) after you have not eaten for a while (fasting). You may have this done every 1-3 years.  Abdominal aortic aneurysm (AAA) screening. You may need this if you are a current or former smoker.  Sexually transmitted disease (STD) testing. Follow these instructions at home: Eating and drinking  Eat  a diet that includes fresh fruits and vegetables, whole grains, lean protein, and low-fat dairy products. Limit your intake of foods with high amounts of sugar, saturated fats, and salt.  Take vitamin and mineral supplements as recommended by your health care provider.  Do not drink alcohol if your health care  provider tells you not to drink.  If you drink alcohol: ? Limit how much you have to 0-2 drinks a day. ? Be aware of how much alcohol is in your drink. In the [place] one drink equals one 12 oz bottle of beer (355 mL), one 5 oz glass of wine (148 mL), or one 1 oz glass of hard liquor (44 mL). Lifestyle  Take daily care of your teeth and gums.  Stay active. Exercise for at least 30 minutes on 5 or more days each week.  Do not use any products that contain nicotine or tobacco, such as cigarettes, e-cigarettes, and chewing tobacco. If you need help quitting, ask your health care provider.  If you are sexually active, practice safe sex. Use a condom or other form of protection to prevent STIs (sexually transmitted infections).  Talk with your health care provider about taking a low-dose aspirin or statin. What's next?  Visit your health care provider once a year for a well check visit.  Ask your health care provider how often you should have your eyes and teeth checked.  Stay up to date on all vaccines. This information is not intended to replace advice given to you by your health care provider. Make sure you discuss any questions you have with your health care provider. Document Revised: 02/05/2018 Document Reviewed: 02/05/2018 Elsevier Patient Education  2020 [employer].

## 2019-04-06 ENCOUNTER — Telehealth: Payer: Self-pay | Admitting: Family Medicine

## 2019-04-06 NOTE — Telephone Encounter (Signed)
Patient has called back and I have given him the lab results. Patient verbalized an understanding.

## 2019-04-06 NOTE — Telephone Encounter (Signed)
The patient returned your call for his lab results. He said that he will just give you a call back this afternoon because he is in and out and don't want to miss the call again.

## 2019-04-16 ENCOUNTER — Ambulatory Visit: Payer: Medicare Other | Admitting: Family Medicine

## 2019-04-20 ENCOUNTER — Ambulatory Visit (INDEPENDENT_AMBULATORY_CARE_PROVIDER_SITE_OTHER): Payer: Medicare Other

## 2019-04-20 ENCOUNTER — Other Ambulatory Visit: Payer: Self-pay

## 2019-04-20 ENCOUNTER — Encounter: Payer: Self-pay | Admitting: Family Medicine

## 2019-04-20 ENCOUNTER — Ambulatory Visit: Payer: Medicare Other | Admitting: Family Medicine

## 2019-04-20 ENCOUNTER — Ambulatory Visit: Payer: Self-pay

## 2019-04-20 VITALS — BP 140/78 | HR 72 | Ht 66.25 in | Wt 166.2 lb

## 2019-04-20 DIAGNOSIS — M899 Disorder of bone, unspecified: Secondary | ICD-10-CM | POA: Diagnosis not present

## 2019-04-20 DIAGNOSIS — M25511 Pain in right shoulder: Secondary | ICD-10-CM | POA: Diagnosis not present

## 2019-04-20 DIAGNOSIS — G8929 Other chronic pain: Secondary | ICD-10-CM

## 2019-04-20 NOTE — Patient Instructions (Addendum)
Thank you for coming in today. Do PT.  Try over the counter voltaren gel. Plan to recheck in about 1 month.  OK to return sooner if needed for injection.

## 2019-04-20 NOTE — Progress Notes (Signed)
Subjective:    CC: R shoulder pain  I, Drew Smith, LAT, ATC, am serving as scribe for Dr. Lynne Leader.  HPI: Pt is a 79 y/o RHD male presenting w/ c/o R shoulder pain x years that has progressively worsened.  He rates his pain at a 2-3/10 at rest and a 7/10 at it's worst and describes his pain as both aching and sharp.  Pain has been ongoing now for about 5 years slightly worsening over the last 6 months or so.  He notes pain worse with normal activities of daily living such as putting his arm above his head showering dressing.  He notes he is having trouble playing golf which is important to him.  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    Radiating pain: Intermittently into the R upper arm R shoulder mechanical symptoms: Yes R UE weakness: Yes UE numbness/tingling: Slight  Neck pain: Yes Aggravating factors: Overhead AROM and R shoulder aBd; functional IR; follow-through of golf swing Treatments tried: IBU and exercise  Diagnostic imaging: C-spine XR- 03/01/98; C-spine MRI- 03/01/98  Pertinent review of Systems: No fevers or chills  Relevant historical information: History contralateral left shoulder " cleanout surgery" by Dr. Theda Sers. History of Guillain-Barr   Objective:    Vitals:   04/20/19 1301  BP: 140/78  Pulse: 72  SpO2: 96%   General: Well Developed, well nourished, and in no acute distress.   MSK: Right shoulder: Shoulder girdle sits in a forward rotated position bilaterally right worse than left. Otherwise normal-appearing. Nontender. Range of motion: Abduction limited to about 130 degrees.  Scapular winging is present some with shoulder abduction. External rotation full. Internal rotation to lumbar spine. Strength: 4/5 abduction.  4+/5 external rotation.  5/5 internal rotation. Positive empty can test. Negative Hawkins and Neer's test. Negative Yergason's  and speeds test. Some clunk/crepitation present with range of motion  Left shoulder: Normal-appearing Nontender. Range of motion abduction limited to about 140 degrees otherwise range of motion is normal. Normal strength. Negative impingement and biceps tendinitis testing.  Lab and Radiology Results X-ray images right shoulder obtained today personally independently reviewed Moderate DJD glenohumeral joint severe DJD AC joint.  No fracture or significant malalignment. Await formal radiology review  Diagnostic Limited MSK Ultrasound of: Right shoulder Biceps tendon not visualized in bicipital groove. Subscapularis tendon intact with trace hypoechoic fluid tracking superficial to tendon. Supraspinatus tendon largely intact.  Some hypoechoic change within tendon consistent with old partial tear.  No significant full-thickness tear or retraction visible.  Hypoechoic fluid tracking at subacromial bursa present. Infraspinatus tendon also intact however a bit thin compared to other tendons consistent with partial tear.  No full-thickness tear or retraction visible. AC joint severely degenerative Impression: Chronic DJD changes in before meals and glenohumeral joint.  Probable old biceps tendon tear. Probable partial tears/chronic tendinopathy of rotator cuff    Impression and Recommendations:    Assessment and Plan: 79 y.o. male with right shoulder pain.  Patient does have degenerative changes seen on x-ray and evidence of chronic rotator cuff tendinopathy on ultrasound.  However he has significant scapula dysfunction on exam.  He should benefit from trial of physical therapy to improve his shoulder motion and to stabilize the scapula.  Recommend trial of Voltaren gel.  Discussed possibility of steroid injection.  Will agree to wait on that and recheck in about a month.  If not better would proceed with injection.  Ultimately if no better would consider MRI for potential surgical  planning.Drew Smith  PDMP not reviewed this encounter. Orders Placed This Encounter  Procedures  . [place] LIMITED JOINT SPACE STRUCTURES UP RIGHT(NO LINKED CHARGES)    Order Specific Question:   Reason for Exam (SYMPTOM  OR DIAGNOSIS REQUIRED)    Answer:   R shoulder pain    Order Specific Question:   Preferred imaging location?    Answer:   [facility]  . DG Shoulder Right    Standing Status:   Future    Number of Occurrences:   1    Standing Expiration Date:   06/17/2020    Order Specific Question:   Reason for Exam (SYMPTOM  OR DIAGNOSIS REQUIRED)    Answer:   eval right shoulder pain    Order Specific Question:   Preferred imaging location?    Answer:   [facility]    Order Specific Question:   Radiology Contrast Protocol - do NOT remove file path    Answer:   [email or web address]   No orders of the defined types were placed in this encounter.   Discussed warning signs or symptoms. Please see discharge instructions. Patient expresses understanding.   The above documentation has been reviewed and is accurate and complete Lynne Leader

## 2019-04-21 NOTE — Progress Notes (Signed)
The main shoulder joint has mild to medium arthritis.  The small shoulder joint at the top of the shoulder (the [facility] joint) has more severe arthritis.  No fractures visible.

## 2019-04-22 ENCOUNTER — Telehealth: Payer: Self-pay | Admitting: Family Medicine

## 2019-04-22 NOTE — Telephone Encounter (Signed)
Patient called back asking to speak with you regarding his xray results. He did get your voicemail but wanted to discuss them further.  He can be reached at [phone]

## 2019-04-22 NOTE — Telephone Encounter (Signed)
Returned pt's call and LM for him to call back at his convenience.

## 2019-04-29 ENCOUNTER — Other Ambulatory Visit: Payer: Self-pay | Admitting: Family Medicine

## 2019-04-30 ENCOUNTER — Ambulatory Visit: Payer: Medicare Other | Attending: Family Medicine | Admitting: Physical Therapy

## 2019-04-30 ENCOUNTER — Encounter: Payer: Self-pay | Admitting: Physical Therapy

## 2019-04-30 ENCOUNTER — Other Ambulatory Visit: Payer: Self-pay

## 2019-04-30 DIAGNOSIS — M25511 Pain in right shoulder: Secondary | ICD-10-CM | POA: Diagnosis not present

## 2019-04-30 DIAGNOSIS — M6281 Muscle weakness (generalized): Secondary | ICD-10-CM | POA: Diagnosis not present

## 2019-04-30 DIAGNOSIS — H9313 Tinnitus, bilateral: Secondary | ICD-10-CM | POA: Diagnosis not present

## 2019-04-30 DIAGNOSIS — R293 Abnormal posture: Secondary | ICD-10-CM

## 2019-04-30 DIAGNOSIS — R252 Cramp and spasm: Secondary | ICD-10-CM

## 2019-04-30 DIAGNOSIS — H903 Sensorineural hearing loss, bilateral: Secondary | ICD-10-CM | POA: Diagnosis not present

## 2019-04-30 DIAGNOSIS — G8929 Other chronic pain: Secondary | ICD-10-CM | POA: Diagnosis not present

## 2019-04-30 NOTE — Patient Instructions (Signed)
Access Code: [email or web address]  URL: [email or web address]  Date: 04/30/2019  Prepared by: Venetia Night Santosh Petter   Exercises  Cervical Retraction with Overpressure - 10 reps - 2 sets - 10 hold - 1x daily - 7x weekly  Seated Scapular Retraction - 10 reps - 2 sets - 1x daily - 7x weekly  Seated Shoulder Flexion AAROM with Pulley Behind - 10 reps - 3 sets - 1x daily - 7x weekly  Seated Shoulder Scaption AAROM with Pulley at Side - 10 reps - 3 sets - 1x daily - 7x weekly  Seated Upper Trapezius Stretch - 3 reps - 1 sets - 30 hold - 1x daily - 7x weekly

## 2019-04-30 NOTE — Therapy (Signed)
[facility] [address] [place], [place], [address] Phone: [phone]   Fax:  [phone]  Physical Therapy Evaluation  Patient Details  Name: Drew Smith MRN: [id number] Date of Birth: 1940-04-24 Referring Provider (PT): Gregor Hams, MD   Encounter Date: 04/30/2019  PT End of Session - 04/30/19 1215    Visit Number  1    Date for PT Re-Evaluation  06/25/19    Authorization Type  UHC Medicare    PT Start Time  [id number]    PT Stop Time  1104    PT Time Calculation (min)  49 min    Activity Tolerance  Patient tolerated treatment well    Behavior During Therapy  [facility] for tasks assessed/performed       Past Medical History:  Diagnosis Date  . Arthritis   . Asthma   . Cancer [facility])    prostate  . GERD (gastroesophageal reflux disease)   . Guillain-Barre syndrome ([place]) 2000  . Hyperlipidemia   . Hypertension     Past Surgical History:  Procedure Laterality Date  . EYE SURGERY  3255   79 years old  . HERNIA REPAIR  1995  . NOSE SURGERY  1990  . PROSTATE SURGERY  2000   seed inplants  . SHOULDER SURGERY  2008    There were no vitals filed for this visit.   Subjective Assessment - 04/30/19 1018    Subjective  Chronic Rt shoudler pain which worsened last year.  Worse with golf season.    Limitations  Lifting;House hold activities    Diagnostic tests  MRI 04/20/19: AC joint severe DJD, old bicep tendon tear likely, partial tears and tendinopathy of RC likely    Patient Stated Goals  play golf, reduce pain    Currently in Pain?  Yes    Pain Score  3    gets up to 8/10   Pain Location  Shoulder    Pain Orientation  Right;Lateral    Pain Descriptors / Indicators  Dull;Aching    Pain Type  Chronic pain    Pain Onset  More than a month ago    Pain Frequency  Constant    Aggravating Factors   overhead reaching, dressing, golf, lift    Pain Relieving Factors  wants to try Voltaren, ibuprofen/Tylenol    Effect of Pain on Daily Activities  golf, dressing, sleep (minimal disturbance), overhead tasks         [facility] PT Assessment - 04/30/19 0001      Assessment   Medical Diagnosis  M25.511,G89.29 (ICD-10-CM) - Chronic right shoulder pain    Referring Provider (PT)  Gregor Hams, MD    Onset Date/Surgical Date  --   > 5 years    Hand Dominance  Right    Next MD Visit  --   30 days   Prior Therapy  for Lt shoulder, had reconstructive surgery      Precautions   Precautions  None      Restrictions   Weight Bearing Restrictions  No      Balance Screen   Has the patient fallen in the past 6 months  No      [place] residence      Prior Function   Level of Independence  Independent    Vocation  Retired    Leisure  golf, dancing (shag)      Cognition   Overall  Cognitive Status  Within Functional Limits for tasks assessed      Observation/Other Assessments   Focus on Therapeutic Outcomes (FOTO)   44%   goal 33%     Sensation   Additional Comments  Pt noted intermittent bil hand and finger tingling with shoulder ROM      Posture/Postural Control   Posture/Postural Control  Postural limitations    Postural Limitations  Rounded Shoulders;Forward head;Increased thoracic kyphosis      ROM / Strength   AROM / PROM / Strength  AROM;Strength;PROM      AROM   Overall AROM Comments  Hx of Lt shoulder surgery, cervical ROM limited approx 30-40% throughout, Hx of signif cervical arthritis    AROM Assessment Site  Shoulder    Right/Left Shoulder  Right;Left    Right Shoulder Extension  70 Degrees    Right Shoulder Flexion  85 Degrees    Right Shoulder ABduction  80 Degrees    Right Shoulder Internal Rotation  --   to T10   Left Shoulder Extension  55 Degrees    Left Shoulder Flexion  125 Degrees    Left Shoulder ABduction  90 Degrees    Left Shoulder Internal Rotation  --   to T10     PROM   Overall PROM Comments  finger and hand  tingling after assessment    PROM Assessment Site  Shoulder    Right/Left Shoulder  Right    Right Shoulder Flexion  110 Degrees   pain, capsular end feel   Right Shoulder ABduction  100 Degrees   pain, capsular end feel     Strength   Overall Strength Comments  4/5 throughout Rt shoulder, shuddering resistance all directions, no pain on testing, scapular stabilizers 3+/5, lag of Rt scapula in prone scap squeeze      Flexibility   Soft Tissue Assessment /Muscle Length  yes   Rt>Lt upper trap, pectorals bil     Palpation   Spinal mobility  poor thoracic extension throughout, poor cervical mobility throughout    Palpation comment  tender Rt>Lt upper trap      Special Tests    Special Tests  Rotator Cuff Impingement    Other special tests  poor Rt A/C joint mobility, Rt GH capsular stiffness present for distraction, inf/post glide    Rotator Cuff Impingment tests  Neer impingement test;Painful Arc of Motion      Neer Impingement test    Findings  Positive    Side  Right      Painful Arc of Motion   Findings  Positive    Side  Right                Objective measurements completed on examination: See above findings.              PT Education - 04/30/19 1104    Education Details  Access Code: [email or web address]    Person(s) Educated  Patient    Methods  Explanation;Handout;Demonstration;Verbal cues    Comprehension  Verbalized understanding;Returned demonstration       PT Short Term Goals - 04/30/19 1225      PT SHORT TERM GOAL #1   Title  Pt will be ind and compliant with initial HEP    Time  3    Period  Weeks    Status  New    Target Date  05/21/19      PT SHORT TERM GOAL #2   Title  Pt  will demo proper postural awareness for cervical and thoracic regions.    Time  3    Period  Weeks    Status  New    Target Date  05/21/19      PT SHORT TERM GOAL #3   Title  Pt will improve scapular strenth to at least 4-/5    Time  6    Period  Weeks    Status   New    Target Date  06/11/19        PT Long Term Goals - 04/30/19 1228      PT LONG TERM GOAL #1   Title  Pt will be ind in advanced HEP to target improved posture, shoulder stabilization, ROM and strength.    Time  8    Period  Weeks    Status  New    Target Date  06/25/19      PT LONG TERM GOAL #2   Title  Pt will be able to play a round of golf with pain not to exceed 3/10.    Time  8    Period  Weeks    Status  New    Target Date  06/25/19      PT LONG TERM GOAL #3   Title  Pt will achieve Rt shoulder A/ROM for flexion to at least 125 for improved reaching tasks.    Time  8    Period  Weeks    Status  New    Target Date  06/25/19      PT LONG TERM GOAL #4   Title  Pt will reduce FOTO score to </= 33% to demo less limitation.    Baseline  44%    Time  8    Period  Weeks    Status  New    Target Date  06/25/19      PT LONG TERM GOAL #5   Title  Pt will demo at least 4+/5 for scapular stabilizers and Rt shoulder for improved tolerance of golf and other UE dependent activities.    Time  8    Period  Weeks    Status  New    Target Date  06/25/19             Plan - 04/30/19 1215    Clinical Impression Statement  Pt is a right-hand dominant male with chronic Rt shoulder pain that worsened with golf season last year.  Pain is worse with overhead reaching, dressing, lifting and golf.  Sleep is mildly disturbed by pain.  Pt presents with signif postural compromise with limited mobility throughout cervical and thoracic spine which is likely contributing to faulty mechanics and pain of Rt shoulder.  Pt has forward head, rounded shoulders, increased thoracic kyphosis and increased cervical lordosis.  Upper traps and pectorals are signif restricted.  Pt has limited A/ROM and P/ROM in right shoulder, particularly for flexion and abduction, with pain.  Rt AC joint and GH joint are restricted.  Strength in Rt shoulder is 4/5 throughout.  Pt has history of Lt shoulder injury  and surgery as well as diffuse cervical arthritis exacerbated by past injuries.  Pt's goals include being able to play golf this season.  PT began postural re-ed and pulleys today, encouraging Pt to get some pulleys for home use.  Pt will benefit from skilled PT to address deficits and improve overall functional use of Rt UE.    Personal Factors and Comorbidities  Age;Time since  onset of injury/illness/exacerbation    Examination-Activity Limitations  Reach Overhead;Carry;Sleep;Lift    Examination-Participation Restrictions  Community Activity    Stability/Clinical Decision Making  Stable/Uncomplicated    Clinical Decision Making  Low    Rehab Potential  Good    PT Frequency  1x / week    PT Duration  8 weeks    PT Treatment/Interventions  ADLs/Self Care Home Management;Cryotherapy;Electrical Stimulation;Iontophoresis 4mg /ml Dexamethasone;Moist Heat;Traction;Functional mobility training;Therapeutic activities;Therapeutic exercise;Neuromuscular re-education;Manual techniques;Patient/family education;Passive range of motion;Dry needling;Joint Manipulations;Spinal Manipulations;Taping    PT Next Visit Plan  UBE with posture cueing, pulleys, review HEP, trial doorway or corner stretch, progress scapular strength (add row and ext), DN upper trap and cervical/thoracic multifidi and give DN handout    PT Home Exercise Plan  Access Code: [email or web address]    Consulted and Agree with Plan of Care  Patient       Patient will benefit from skilled therapeutic intervention in order to improve the following deficits and impairments:  Decreased range of motion, Increased fascial restricitons, Increased muscle spasms, Impaired UE functional use, Pain, Hypomobility, Impaired flexibility, Improper body mechanics, Postural dysfunction, Decreased strength, Decreased mobility  Visit Diagnosis: Chronic right shoulder pain - Plan: PT plan of care cert/re-cert  Abnormal posture - Plan: PT plan of care cert/re-cert  Muscle  weakness (generalized) - Plan: PT plan of care cert/re-cert  Cramp and spasm - Plan: PT plan of care cert/re-cert     Problem List Patient Active Problem List   Diagnosis Date Noted  . Upper respiratory tract infection 02/14/2017  . Porokeratosis 09/07/2014  . Metatarsal deformity 09/07/2014  . Pain in lower limb 09/07/2014  . Squamous cell carcinoma in situ of skin 10/15/2013  . Hives 10/06/2012  . History of Guillain-Barre syndrome 03/07/2011  . Hypertension 11/28/2010  . Dyslipidemia 11/28/2010  . ERECTILE DYSFUNCTION 01/05/2008  . ASTHMA 10/28/2007  . GERD 10/28/2007  . DIZZINESS 10/28/2007  . COUGH 10/28/2007  . FASTING HYPERGLYCEMIA 10/28/2007  . PROSTATE CANCER, HX OF 10/28/2007    Baruch Merl, PT 04/30/19 12:32 PM   [facility] [address] [place], [place], [address] Phone: [phone]   Fax:  [phone]  Name: Drew Smith MRN: [id number] Date of Birth: 1940-06-10

## 2019-05-06 ENCOUNTER — Ambulatory Visit: Payer: Medicare Other | Admitting: Physical Therapy

## 2019-05-06 ENCOUNTER — Encounter: Payer: Self-pay | Admitting: Physical Therapy

## 2019-05-06 ENCOUNTER — Other Ambulatory Visit: Payer: Self-pay

## 2019-05-06 DIAGNOSIS — R252 Cramp and spasm: Secondary | ICD-10-CM | POA: Diagnosis not present

## 2019-05-06 DIAGNOSIS — G8929 Other chronic pain: Secondary | ICD-10-CM

## 2019-05-06 DIAGNOSIS — H9313 Tinnitus, bilateral: Secondary | ICD-10-CM | POA: Diagnosis not present

## 2019-05-06 DIAGNOSIS — M25511 Pain in right shoulder: Secondary | ICD-10-CM | POA: Diagnosis not present

## 2019-05-06 DIAGNOSIS — M6281 Muscle weakness (generalized): Secondary | ICD-10-CM | POA: Diagnosis not present

## 2019-05-06 DIAGNOSIS — H903 Sensorineural hearing loss, bilateral: Secondary | ICD-10-CM | POA: Diagnosis not present

## 2019-05-06 DIAGNOSIS — R293 Abnormal posture: Secondary | ICD-10-CM

## 2019-05-06 NOTE — Therapy (Signed)
[facility] [address], [place], [place], [address] Phone: [phone]   Fax:  [phone]  Physical Therapy Treatment  Patient Details  Name: Drew Smith MRN: [id number] Date of Birth: [birth year] Referring Provider (PT): Gregor Hams, MD   Encounter Date: 05/06/2019  PT End of Session - 05/06/19 1618    Visit Number  2    Date for PT Re-Evaluation  06/25/19    Authorization Type  [employer] Medicare    PT Start Time  1618    PT Stop Time  1656    PT Time Calculation (min)  38 min    Activity Tolerance  Patient tolerated treatment well    Behavior During Therapy  [facility] for tasks assessed/performed       Past Medical History:  Diagnosis Date  . Arthritis   . Asthma   . Cancer [facility])    prostate  . GERD (gastroesophageal reflux disease)   . Guillain-Barre syndrome ([place]) 2000  . Hyperlipidemia   . Hypertension     Past Surgical History:  Procedure Laterality Date  . EYE SURGERY  5836   79 years old  . HERNIA REPAIR  1995  . NOSE SURGERY  1990  . PROSTATE SURGERY  2000   seed inplants  . SHOULDER SURGERY  2008    There were no vitals filed for this visit.  Subjective Assessment - 05/06/19 1621    Subjective  Pt reports he golfed today, and it was not bad enough to take medication.  He doesn't have pulley so he has been just simulating motion.    Diagnostic tests  MRI 04/20/19: AC joint severe DJD, old bicep tendon tear likely, partial tears and tendinopathy of RC likely    Currently in Pain?  No/denies    Pain Score  0-No pain         [facility] PT Assessment - 05/06/19 0001      Assessment   Medical Diagnosis  M25.511,G89.29 (ICD-10-CM) - Chronic right shoulder pain    Referring Provider (PT)  Gregor Hams, MD    Onset Date/Surgical Date  --   > 5 years    Hand Dominance  Right    Next MD Visit  --   30 days   Prior Therapy  for Lt shoulder, had reconstructive surgery      AROM   Right/Left  Shoulder  Right   taken in standing   Right Shoulder Extension  70 Degrees    Right Shoulder Flexion  107 Degrees    Right Shoulder ABduction  100 Degrees    Right Shoulder Internal Rotation  --   thumb to T10   Right Shoulder External Rotation  68 Degrees       [facility] Adult PT Treatment/Exercise - 05/06/19 0001      Self-Care   Self-Care  Other Self-Care Comments    Other Self-Care Comments   pt educated on self massage to Rt pec and periscapular musculature to decrease fascial restrictions and improve ROM; pt returned demo with cues.       Exercises   Exercises  Shoulder      Neck Exercises: Seated   Neck Retraction  10 reps;3 secs   per HEP   Cervical Rotation  Right;Left;5 reps    Other Seated Exercise  scap retraction x 3-5 sec x 5 reps       Neck Exercises: Supine   Other Supine Exercise  scap retraction x 5  sec x 3 reps      Shoulder Exercises: Supine   External Rotation  AAROM;Right;10 reps    Theraband Level (Shoulder External Rotation)  Level 2 (Red)   10 reps with band   Flexion  Both;10 reps;AAROM    ABduction  AAROM;Right;10 reps      Shoulder Exercises: Standing   Flexion  AAROM;Right;10 reps   holding golf club   ABduction  AAROM;Right;5 reps   holding golf club   Extension  Both;10 reps;AAROM   holding golf club     Shoulder Exercises: Pulleys   Flexion  --   10 reps, 5 sec hold    ABduction  --   10 reps, 5 sec hold.      Shoulder Exercises: Stretch   Other Shoulder Stretches  doorway stretch with arms abdct 90 deg x 15 sec x 3 reps       Manual Therapy   Manual Therapy  Myofascial release;Soft tissue mobilization    Soft tissue mobilization  STM to Rt ant shoulder     Myofascial Release  Rt pec release       Neck Exercises: Stretches   Upper Trapezius Stretch  Right;Left;3 reps;10 seconds             PT Education - 05/06/19 1712    Education Details  HEP - added doorway stretch and AAROM flexion    Person(s) Educated  Patient     Methods  Explanation;Demonstration;Verbal cues;Handout    Comprehension  Returned demonstration;Verbalized understanding       PT Short Term Goals - 04/30/19 1225      PT SHORT TERM GOAL #1   Title  Pt will be ind and compliant with initial HEP    Time  3    Period  Weeks    Status  New    Target Date  05/21/19      PT SHORT TERM GOAL #2   Title  Pt will demo proper postural awareness for cervical and thoracic regions.    Time  3    Period  Weeks    Status  New    Target Date  05/21/19      PT SHORT TERM GOAL #3   Title  Pt will improve scapular strenth to at least 4-/5    Time  6    Period  Weeks    Status  New    Target Date  06/11/19        PT Long Term Goals - 04/30/19 1228      PT LONG TERM GOAL #1   Title  Pt will be ind in advanced HEP to target improved posture, shoulder stabilization, ROM and strength.    Time  8    Period  Weeks    Status  New    Target Date  06/25/19      PT LONG TERM GOAL #2   Title  Pt will be able to play a round of golf with pain not to exceed 3/10.    Time  8    Period  Weeks    Status  New    Target Date  06/25/19      PT LONG TERM GOAL #3   Title  Pt will achieve Rt shoulder A/ROM for flexion to at least 125 for improved reaching tasks.    Time  8    Period  Weeks    Status  New    Target Date  06/25/19  PT LONG TERM GOAL #4   Title  Pt will reduce FOTO score to </= 33% to demo less limitation.    Baseline  44%    Time  8    Period  Weeks    Status  New    Target Date  06/25/19      PT LONG TERM GOAL #5   Title  Pt will demo at least 4+/5 for scapular stabilizers and Rt shoulder for improved tolerance of golf and other UE dependent activities.    Time  8    Period  Weeks    Status  New    Target Date  06/25/19            Plan - 05/06/19 1710    Clinical Impression Statement  Pt demonstrated improved Rt shoulder AROM.  He had some soreness with AAROM with Rt shoulder.  Tight and tender Rt ant shoulder;  pt shown ball massage to this area to soften and desensitize area.  Progressing well towards goals.    Rehab Potential  Good    PT Frequency  1x / week    PT Duration  8 weeks    PT Treatment/Interventions  ADLs/Self Care Home Management;Cryotherapy;Electrical Stimulation;Iontophoresis 4mg /ml Dexamethasone;Moist Heat;Traction;Functional mobility training;Therapeutic activities;Therapeutic exercise;Neuromuscular re-education;Manual techniques;Patient/family education;Passive range of motion;Dry needling;Joint Manipulations;Spinal Manipulations;Taping    PT Next Visit Plan  UBE with posture cueing, pulleys, progress scapular strength (add row and ext), DN upper trap and cervical/thoracic multifidi and give DN handout    PT Home Exercise Plan  Access Code: [email or web address]    Consulted and Agree with Plan of Care  Patient       Patient will benefit from skilled therapeutic intervention in order to improve the following deficits and impairments:  Decreased range of motion, Increased fascial restricitons, Increased muscle spasms, Impaired UE functional use, Pain, Hypomobility, Impaired flexibility, Improper body mechanics, Postural dysfunction, Decreased strength, Decreased mobility  Visit Diagnosis: Chronic right shoulder pain  Abnormal posture  Muscle weakness (generalized)     Problem List Patient Active Problem List   Diagnosis Date Noted  . Upper respiratory tract infection 02/14/2017  . Porokeratosis 09/07/2014  . Metatarsal deformity 09/07/2014  . Pain in lower limb 09/07/2014  . Squamous cell carcinoma in situ of skin 10/15/2013  . Hives 10/06/2012  . History of Guillain-Barre syndrome 03/07/2011  . Hypertension 11/28/2010  . Dyslipidemia 11/28/2010  . ERECTILE DYSFUNCTION 01/05/2008  . ASTHMA 10/28/2007  . GERD 10/28/2007  . DIZZINESS 10/28/2007  . COUGH 10/28/2007  . FASTING HYPERGLYCEMIA 10/28/2007  . PROSTATE CANCER, HX OF 10/28/2007   Kerin Perna, PTA 05/06/19  5:15 PM  [facility] [address] [place], [place], [address] Phone: [phone]   Fax:  [phone]  Name: Drew Smith MRN: [id number] Date of Birth: 02-May-1940

## 2019-05-06 NOTE — Patient Instructions (Signed)
Access Code: [email or web address]  URL: [email or web address]  Date: 05/06/2019  Prepared by: Kerin Perna   Exercises  Cervical Retraction with Overpressure - 10 reps - 2 sets - 10 hold - 1x daily - 7x weekly  Seated Scapular Retraction - 5 reps - 2 sets - 5 hold - 2x daily - 7x weekly  Seated Shoulder Flexion AAROM with Pulley Behind - 10 reps - 3 sets - 1x daily - 7x weekly  Seated Shoulder Scaption AAROM with Pulley at Side - 10 reps - 3 sets - 1x daily - 7x weekly  Seated Upper Trapezius Stretch - 3 reps - 1 sets - 30 hold - 1x daily - 7x weekly  Shoulder Flexion Overhead with Dowel - 5-10 reps - 1 sets - 5 hold - 1x daily - 7x weekly  Doorway Pec Stretch at 90 Degrees Abduction - 2 reps - 1 sets - 15 hold - 2x daily - 7x weekly

## 2019-05-10 ENCOUNTER — Other Ambulatory Visit: Payer: Self-pay

## 2019-05-11 ENCOUNTER — Ambulatory Visit (INDEPENDENT_AMBULATORY_CARE_PROVIDER_SITE_OTHER): Payer: Medicare Other | Admitting: Family Medicine

## 2019-05-11 ENCOUNTER — Encounter: Payer: Self-pay | Admitting: Family Medicine

## 2019-05-11 VITALS — BP 132/76 | HR 65 | Temp 97.6°F | Wt 167.4 lb

## 2019-05-11 DIAGNOSIS — H539 Unspecified visual disturbance: Secondary | ICD-10-CM | POA: Diagnosis not present

## 2019-05-11 DIAGNOSIS — R42 Dizziness and giddiness: Secondary | ICD-10-CM

## 2019-05-11 DIAGNOSIS — H9313 Tinnitus, bilateral: Secondary | ICD-10-CM

## 2019-05-11 DIAGNOSIS — L820 Inflamed seborrheic keratosis: Secondary | ICD-10-CM | POA: Diagnosis not present

## 2019-05-11 MED ORDER — HYDROCHLOROTHIAZIDE 12.5 MG PO CAPS: 12.5000 mg | ORAL_CAPSULE | Freq: Every day | ORAL | 3 refills | Status: DC

## 2019-05-11 MED ORDER — AMLODIPINE BESYLATE 5 MG PO TABS: 5.0000 mg | ORAL_TABLET | Freq: Every day | ORAL | 3 refills | Status: DC

## 2019-05-11 NOTE — Progress Notes (Signed)
Established Patient Office Visit  Subjective:  Patient ID: Drew Smith, male    DOB: [birth year]  Age: 79 y.o. MRN: [id number]  CC:  Chief Complaint  Patient presents with  . Blurred Vision    In left eye pt states it happens once a month     HPI Drew Smith presents for skin lesion on the top of his head that is bothersome. He discovered it by rubbing across the top of his head. He has had a skin cancer removed from his face recently, unsure of type.   Also reports ringing in ears that occurs most of the time and seems to be worsened by loud music and frequent bending forward. He asks  if he has a hearing test scheduled which I was able to confirm for him through the appointment screen.  Reports dizziness with movement at times. He denies room spinning, denies nausea or headache with dizziness. He states he is able to drive and play golf without difficulty. He denies difficulty walking but prefers not to sit up on exam table unattended.  Reports episode of partial vision loss 2 nights ago that resolved on its own. He states he was looking at a puzzle and at the television and could not see the middle of the screen or the puzzle but could see the right and left sides of the screen. Denies floaters at that time but has experienced those in the past. He reports he is due for an eye exam.   Past Medical History:  Diagnosis Date  . Arthritis   . Asthma   . Cancer [facility])    prostate  . GERD (gastroesophageal reflux disease)   . Guillain-Barre syndrome ([place]) 2000  . Hyperlipidemia   . Hypertension     Past Surgical History:  Procedure Laterality Date  . EYE SURGERY  [age]   79 years old  . HERNIA REPAIR  1995  . NOSE SURGERY  1990  . PROSTATE SURGERY  2000   seed inplants  . SHOULDER SURGERY  2008    Family History  Problem Relation Age of Onset  . Hyperlipidemia Brother   . Stroke Brother   . Diabetes Brother        type ll  . Alcohol abuse Father     Social  History   Socioeconomic History  . Marital status: Divorced    Spouse name: Not on file  . Number of children: 3  . Years of education: Not on file  . Highest education level: Some college, no degree  Occupational History  . Occupation: retired  Tobacco Use  . Smoking status: Never Smoker  . Smokeless tobacco: Never Used  Substance and Sexual Activity  . Alcohol use: Yes    Alcohol/week: 7.0 standard drinks    Types: 7 Glasses of wine per week    Comment: have 1/2 a day; wine or other   . Drug use: No  . Sexual activity: Not Currently  Other Topics Concern  . Not on file  Social History Narrative   Lives alone in one level. Has two sons 1 daughter and friends who serve as support.   Former Chiropodist; Enjoys Data processing manager, reading   Originally from [place]   Social Determinants of Health   Financial Resource Strain: [place]   . Difficulty of Paying Living Expenses: Not hard at all  Food Insecurity: No Food Insecurity  . Worried About Charity fundraiser in the Last Year: Never true  .  Ran Out of Food in the Last Year: Never true  Transportation Needs: No Transportation Needs  . Lack of Transportation (Medical): No  . Lack of Transportation (Non-Medical): No  Physical Activity: Sufficiently Active  . Days of Exercise per Week: 7 days  . Minutes of Exercise per Session: 30 min  Stress: Stress Concern Present  . Feeling of Stress : Rather much  Social Connections: Unknown  . Frequency of Communication with Friends and Family: More than three times a week  . Frequency of Social Gatherings with Friends and Family: Once a week  . Attends Religious Services: Not on file  . Active Member of Clubs or Organizations: No  . Attends Archivist Meetings: Never  . Marital Status: Divorced  Human resources officer Violence:   . Fear of Current or Ex-Partner:   . Emotionally Abused:   [name] Physically Abused:   . Sexually Abused:     Outpatient Medications Prior to Visit    Medication Sig Dispense Refill  . acyclovir (ZOVIRAX) 400 MG tablet TAKE 1 TABLET (400 MG TOTAL) BY MOUTH AS NEEDED. 90 tablet 2  . amLODipine (NORVASC) 5 MG tablet TAKE ONE TABLET BY MOUTH DAILY 30 tablet 0  . azelastine (ASTELIN) 0.1 % nasal spray Place 1 spray into both nostrils 2 (two) times daily. Use in each nostril as directed (Patient taking differently: Place 1 spray into both nostrils daily as needed. Use in each nostril as directed) 30 mL 12  . ciclopirox (LOPROX) 0.77 % cream Apply topically 2 (two) times daily. 15 g 1  . diphenhydrAMINE-zinc acetate (BENADRYL) cream Apply 1 application topically daily as needed for itching.    . fluorouracil (EFUDEX) 5 % cream Apply topically as needed. Per Dr Joaquim Lai    . hydrochlorothiazide (MICROZIDE) 12.5 MG capsule TAKE ONE CAPSULE BY MOUTH DAILY 30 capsule 0  . ibuprofen (ADVIL,MOTRIN) 600 MG tablet Only use 800 mg  8 hours sparingly. (Patient taking differently: Take 600 mg by mouth every 6 (six) hours as needed. Only use 800 mg  8 hours sparingly.) 60 tablet 0  . levocetirizine (XYZAL) 5 MG tablet TAKE ONE TABLET BY MOUTH EVERY EVENING 90 tablet 0  . omeprazole (PRILOSEC) 20 MG capsule TAKE ONE CAPSULE BY MOUTH TWICE A DAY 180 capsule 0  . Oxymetazoline HCl (NASAL SPRAY) 0.05 % SOLN Place into the nose.    . sildenafil (REVATIO) 20 MG tablet TAKE 2 TO 5 TABLETS BY MOUTH 1 HOUR PRIOR TO SEXUAL ACTIVITY 50 tablet 0  . triamcinolone cream (KENALOG) 0.1 % APPLY TO AFFECTED AREA(S) TWO TIMES A DAY 45 g 1  . VENTOLIN HFA 108 (90 Base) MCG/ACT inhaler INHALE TWO PUFFS BY MOUTH EVERY 6 HOURS AS NEEDED FOR WHEEZING OR SHORTNESS OF BREATH 18 each 0   No facility-administered medications prior to visit.    No Known Allergies  ROS Review of Systems  Constitutional: Negative for activity change, appetite change, fatigue and unexpected weight change.  HENT: Negative for congestion, ear discharge, ear pain, sinus pressure, sinus pain and sore throat.    Eyes: Positive for visual disturbance. Negative for photophobia, pain, discharge and redness.  Respiratory: Negative for chest tightness and shortness of breath.   Gastrointestinal: Negative.   Endocrine: Negative.   Genitourinary: Negative.   Musculoskeletal: Negative for gait problem, myalgias and neck stiffness.  Skin: Negative for color change and rash.  Allergic/Immunologic: Negative for environmental allergies.  Neurological: Positive for dizziness. Negative for syncope, facial asymmetry, speech difficulty, weakness and  headaches.  Hematological: Negative for adenopathy.  Psychiatric/Behavioral: Positive for dysphoric mood. Negative for confusion. The patient is not nervous/anxious.       Objective:    Physical Exam  Constitutional: He is oriented to person, place, and time. He appears well-developed and well-nourished. No distress.  HENT:  Head: Normocephalic and atraumatic.  Right Ear: External ear normal.  Left Ear: External ear normal.  Eyes: Pupils are equal, round, and reactive to light. Conjunctivae and EOM are normal.  Neck: No JVD present. No tracheal deviation present.  Cardiovascular: Normal rate, regular rhythm and normal heart sounds. Exam reveals no gallop and no friction rub.  No murmur heard. Pulmonary/Chest: Effort normal and breath sounds normal.  Abdominal: He exhibits no distension. There is no abdominal tenderness.  Musculoskeletal:        General: Normal range of motion.     Cervical back: Normal range of motion and neck supple.  Lymphadenopathy:    He has no cervical adenopathy.  Neurological: He is alert and oriented to person, place, and time.  Skin: Skin is warm.  Small skin colored papule identified as area of concern on top of patient's ear right side  Psychiatric: He has a normal mood and affect. His behavior is normal. Thought content normal.    BP 132/76 (BP Location: Right Arm, Patient Position: Sitting, Cuff Size: Normal)   Pulse 65    Temp 97.6 F (36.4 C) (Temporal)   Wt 167 lb 6.4 oz (75.9 kg)   SpO2 96%   BMI 26.82 kg/m  Wt Readings from Last 3 Encounters:  05/11/19 167 lb 6.4 oz (75.9 kg)  04/20/19 166 lb 3.2 oz (75.4 kg)  04/05/19 165 lb (74.8 kg)     There are no preventive care reminders to display for this patient.  There are no preventive care reminders to display for this patient.  Lab Results  Component Value Date   TSH 2.96 04/05/2019   Lab Results  Component Value Date   WBC 6.3 04/05/2019   HGB 14.9 04/05/2019   HCT 43.3 04/05/2019   MCV 93.7 04/05/2019   PLT 233.0 04/05/2019   Lab Results  Component Value Date   NA 138 04/05/2019   K 4.0 04/05/2019   CO2 29 04/05/2019   GLUCOSE 107 (H) 04/05/2019   BUN 18 04/05/2019   CREATININE 0.99 04/05/2019   BILITOT 0.5 04/05/2019   ALKPHOS 58 04/05/2019   AST 14 04/05/2019   ALT 12 04/05/2019   PROT 6.7 04/05/2019   ALBUMIN 4.0 04/05/2019   CALCIUM 9.0 04/05/2019   GFR 72.81 04/05/2019   Lab Results  Component Value Date   CHOL 163 04/05/2019   Lab Results  Component Value Date   HDL 47.60 04/05/2019   Lab Results  Component Value Date   LDLCALC 95 04/05/2019   Lab Results  Component Value Date   TRIG 103.0 04/05/2019   Lab Results  Component Value Date   CHOLHDL 3 04/05/2019   Lab Results  Component Value Date   HGBA1C 5.4 12/08/2015      Assessment & Plan:   Problem List Items Addressed This Visit    None     ASSESSMENT: 1. Skin lesion - seborrheic keratosis - small round skin colored papule, <5 mm with clear borders, no ulceration, and no discoloration or telangiectasia 2. Ringing in ears - occurs frequently, no recent trauma. Occasionally notices pulsatile tinnitus but this does not occur frequently. 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  Has hearing assessment with audiologist on 3/30. He is looking forward to this  evaluation.  3. Dizziness - Romberg test performed, no ataxia. He does not appear to have sympotms of orthostatic hypotension. He continues to play golf and perform ADLs without difficulty.  4. Vision loss - no recent eye exam. Discussed need for full visual exam soon.   PLAN: 1. Seborrheic keratosis - Area treated with liquid nitrogen per patient request. He is aware to monitor area and to be gentle with washing.  2. Ringing in ears - Will await testing with audiologist. Patient is aware to report acute hearing loss in one or both ears immediately. 3. Dizziness - no further work-up recommended at this time. Appears to be mild and does not interfere with activity. Advised patient to continue to monitor and call back to report worsening of symptoms.  4. Patient agrees to call opthamologist tomorrow and states he is usually able to get an appointment without difficulty.   No orders of the defined types were placed in this encounter.   Follow-up: No follow-ups on file.    Emmaline Life, RN, BSN AGPCNP Student, [employer] [facility]  Above notes reviewed.  Pt presented with several issues today and we discussed the following:  #1  Benign appearing seborrheic keratosis of scalp - right parietal area sometimes itching and irritating.  Discussed risks and benefits of treatment with liquid nitrogen including risk of blistering and low  Risk of infection and he consents.  Tolerated well.  #2 intermittent bilateral tinnitus.  Not truly pulsatile by hx.   This is chronic and not associated with any sudden hearing change.  Audiometry assessment pending.  #3 chronic intermittent vestibular disturbance.  Hx of multiple remote concussions  #4 transient focal visual deficit 2 days ago with not recurrence since with non-focal exam at this time    Prior Carotid dopplers through lifeline screen normal.  NSR at this time   Pt setting up ophthalmology exam.   [name] MD [facility] at  [facility]

## 2019-05-11 NOTE — Patient Instructions (Signed)
Call today to schedule a complete eye exam  Refills of your blood pressure medicines have been sent to your pharmacy

## 2019-05-13 DIAGNOSIS — H2513 Age-related nuclear cataract, bilateral: Secondary | ICD-10-CM | POA: Diagnosis not present

## 2019-05-13 DIAGNOSIS — H5319 Other subjective visual disturbances: Secondary | ICD-10-CM | POA: Diagnosis not present

## 2019-05-18 ENCOUNTER — Other Ambulatory Visit: Payer: Self-pay

## 2019-05-18 ENCOUNTER — Ambulatory Visit: Payer: Medicare Other | Admitting: Physical Therapy

## 2019-05-18 ENCOUNTER — Encounter: Payer: Self-pay | Admitting: Physical Therapy

## 2019-05-18 DIAGNOSIS — H9313 Tinnitus, bilateral: Secondary | ICD-10-CM | POA: Diagnosis not present

## 2019-05-18 DIAGNOSIS — R293 Abnormal posture: Secondary | ICD-10-CM

## 2019-05-18 DIAGNOSIS — M25511 Pain in right shoulder: Secondary | ICD-10-CM

## 2019-05-18 DIAGNOSIS — G8929 Other chronic pain: Secondary | ICD-10-CM

## 2019-05-18 DIAGNOSIS — M6281 Muscle weakness (generalized): Secondary | ICD-10-CM

## 2019-05-18 DIAGNOSIS — H903 Sensorineural hearing loss, bilateral: Secondary | ICD-10-CM | POA: Diagnosis not present

## 2019-05-18 DIAGNOSIS — R252 Cramp and spasm: Secondary | ICD-10-CM | POA: Diagnosis not present

## 2019-05-18 NOTE — Patient Instructions (Signed)
Access Code: [email or web address] URL: [email or web address]: 03/23/2021Prepared by: Venetia Night BeuhringExercises  Cervical Retraction with Overpressure - 1 x daily - 7 x weekly - 10 reps - 2 sets - 10 hold  Seated Scapular Retraction - 2 x daily - 7 x weekly - 5 reps - 2 sets - 5 hold  Seated Upper Trapezius Stretch - 1 x daily - 7 x weekly - 3 reps - 1 sets - 30 hold  Shoulder Flexion Overhead with Dowel - 1 x daily - 7 x weekly - 5-10 reps - 1 sets - 5 hold  Doorway Pec Stretch at 90 Degrees Abduction - 2 x daily - 7 x weekly - 2 reps - 1 sets - 15 hold  Shoulder Flexion Wall Slide with Towel - 1 x daily - 7 x weekly - 2 sets - 10 reps - 5 hold  Push-Up on Counter - 1 x daily - 7 x weekly - 2 sets - 15 reps

## 2019-05-18 NOTE — Therapy (Signed)
[facility] [address] [place], [place], [address] Phone: [phone]   Fax:  [phone]  Physical Therapy Treatment  Patient Details  Name: Drew Smith MRN: [id number] Date of Birth: [birth year] Referring Provider (PT): Gregor Hams, MD   Encounter Date: 05/18/2019  PT End of Session - 05/18/19 1626    Visit Number  3    Date for PT Re-Evaluation  06/25/19    Authorization Type  [employer]HC Medicare    PT Start Time  1622    PT Stop Time  1706    PT Time Calculation (min)  44 min    Activity Tolerance  Patient tolerated treatment well    Behavior During Therapy  [facility] for tasks assessed/performed       Past Medical History:  Diagnosis Date  . Arthritis   . Asthma   . Cancer [facility])    prostate  . GERD (gastroesophageal reflux disease)   . Guillain-Barre syndrome ([place]) 2000  . Hyperlipidemia   . Hypertension     Past Surgical History:  Procedure Laterality Date  . EYE SURGERY  5315   79 years old  . HERNIA REPAIR  1995  . NOSE SURGERY  1990  . PROSTATE SURGERY  2000   seed inplants  . SHOULDER SURGERY  2008    There were no vitals filed for this visit.  Subjective Assessment - 05/18/19 1627    Subjective  Intermittent good days/bad days.  I played golf today.  My golf swing isn't great due to pain, ROM.    Limitations  Lifting;House hold activities    Diagnostic tests  MRI 04/20/19: AC joint severe DJD, old bicep tendon tear likely, partial tears and tendinopathy of RC likely    Patient Stated Goals  play golf, reduce pain    Currently in Pain?  Yes    Pain Score  4     Pain Location  Shoulder    Pain Orientation  Right;Lateral    Pain Descriptors / Indicators  Dull;Aching    Pain Type  Chronic pain    Pain Onset  More than a month ago    Pain Frequency  Intermittent    Aggravating Factors   golf, overhead reach, dressing, lift    Pain Relieving Factors  ibuprofen/Tylenol                        [facility] Adult PT Treatment/Exercise - 05/18/19 0001      Neuro Re-ed    Neuro Re-ed Details   Rt scapular PNF concentric/eccentric in Lt SL: 12/6:00, 9:00 (retraction), 2/8:00      Exercises   Exercises  Shoulder      Shoulder Exercises: Standing   External Rotation  Strengthening;Right;10 reps;Theraband    Theraband Level (Shoulder External Rotation)  Level 2 (Red)    Internal Rotation  Strengthening;Right;10 reps;Theraband    Theraband Level (Shoulder Internal Rotation)  Level 2 (Red)    Extension  Strengthening;Both;Theraband;10 reps    Theraband Level (Shoulder Extension)  Level 2 (Red)    Row  Strengthening;15 reps;Theraband    Theraband Level (Shoulder Row)  Level 4 (Blue)    Other Standing Exercises  counter push ups x 10 reps (HEP)      Shoulder Exercises: ROM/Strengthening   UBE (Upper Arm Bike)  2x2 L2, PT present to discuss symptoms    Other ROM/Strengthening Exercises  finger ladder Rt UE, PT manual facil scapular motion  x 3 rounds    Other ROM/Strengthening Exercises  Rt shoulder flexion wall slides 10x5 sec holds      Shoulder Exercises: Stretch   Other Shoulder Stretches  doorway stretch with arms abdct 90 deg x 15 sec x 3 reps       Manual Therapy   Manual Therapy  Joint mobilization    Joint Mobilization  thoracic PAs Gr II/III in Lt SL             PT Education - 05/18/19 1704    Education Details  Access Code: [email or web address]    Person(s) Educated  Patient    Methods  Explanation    Comprehension  Verbalized understanding;Returned demonstration;Tactile cues required       PT Short Term Goals - 05/18/19 1714      PT SHORT TERM GOAL #1   Title  Pt will be ind and compliant with initial HEP    Status  On-going      PT SHORT TERM GOAL #2   Title  Pt will demo proper postural awareness for cervical and thoracic regions.    Status  On-going      PT SHORT TERM GOAL #3   Title  Pt will improve scapular strenth to at least  4-/5    Status  On-going        PT Long Term Goals - 04/30/19 1228      PT LONG TERM GOAL #1   Title  Pt will be ind in advanced HEP to target improved posture, shoulder stabilization, ROM and strength.    Time  8    Period  Weeks    Status  New    Target Date  06/25/19      PT LONG TERM GOAL #2   Title  Pt will be able to play a round of golf with pain not to exceed 3/10.    Time  8    Period  Weeks    Status  New    Target Date  06/25/19      PT LONG TERM GOAL #3   Title  Pt will achieve Rt shoulder A/ROM for flexion to at least 125 for improved reaching tasks.    Time  8    Period  Weeks    Status  New    Target Date  06/25/19      PT LONG TERM GOAL #4   Title  Pt will reduce FOTO score to </= 33% to demo less limitation.    Baseline  44%    Time  8    Period  Weeks    Status  New    Target Date  06/25/19      PT LONG TERM GOAL #5   Title  Pt will demo at least 4+/5 for scapular stabilizers and Rt shoulder for improved tolerance of golf and other UE dependent activities.    Time  8    Period  Weeks    Status  New    Target Date  06/25/19            Plan - 05/18/19 1709    Clinical Impression Statement  Pt continues to have Rt shoulder pain that is more intermittent in nature.  Worse with golf which he played today.  Arrived with pain 2-4/10.  Pt reported he bruises very easily and bruised after last session's STM.  PT deferred DN today.  Session focused on Rt shoulder AA/ROM using wall slides (HEP) with  hold for capsular stretch and finger ladder with PT manually assisting scapular mechanics.  PT performed manual techniques for t-spine stiffness and manual facil and resistance for neuro re-ed for scapular control. PT discussed Pt's current HEP which he had from prior to starting this episode of care and reviewed technique and updated push ups.  Pt will continue to benefit from skilled PT to address pain and deficits in posture, ROM, strength.    Rehab Potential   Good    PT Frequency  1x / week    PT Duration  8 weeks    PT Treatment/Interventions  ADLs/Self Care Home Management;Cryotherapy;Electrical Stimulation;Iontophoresis 4mg /ml Dexamethasone;Moist Heat;Traction;Functional mobility training;Therapeutic activities;Therapeutic exercise;Neuromuscular re-education;Manual techniques;Patient/family education;Passive range of motion;Dry needling;Joint Manipulations;Spinal Manipulations;Taping    PT Next Visit Plan  UBE, wall slides, f/u on counter push ups, teach Pt multi-angle rows by adjusting height of band in doorway (he is doing low to high row from old program only), thoracic mobs, pec stretch    PT Home Exercise Plan  Access Code: [email or web address]    Consulted and Agree with Plan of Care  Patient       Patient will benefit from skilled therapeutic intervention in order to improve the following deficits and impairments:     Visit Diagnosis: Chronic right shoulder pain  Abnormal posture  Muscle weakness (generalized)  Cramp and spasm     Problem List Patient Active Problem List   Diagnosis Date Noted  . Upper respiratory tract infection 02/14/2017  . Porokeratosis 09/07/2014  . Metatarsal deformity 09/07/2014  . Pain in lower limb 09/07/2014  . Squamous cell carcinoma in situ of skin 10/15/2013  . Hives 10/06/2012  . History of Guillain-Barre syndrome 03/07/2011  . Hypertension 11/28/2010  . Dyslipidemia 11/28/2010  . ERECTILE DYSFUNCTION 01/05/2008  . ASTHMA 10/28/2007  . GERD 10/28/2007  . DIZZINESS 10/28/2007  . COUGH 10/28/2007  . FASTING HYPERGLYCEMIA 10/28/2007  . PROSTATE CANCER, HX OF 10/28/2007    Baruch Merl, PT 05/18/19 5:15 PM    [facility] [address] [place], [place], [address] Phone: [phone]   Fax:  [phone]  Name: Drew Smith MRN: [id number] Date of Birth: [birth year]

## 2019-05-25 ENCOUNTER — Ambulatory Visit: Payer: Medicare Other | Admitting: Audiology

## 2019-05-25 ENCOUNTER — Other Ambulatory Visit: Payer: Self-pay

## 2019-05-25 DIAGNOSIS — R252 Cramp and spasm: Secondary | ICD-10-CM | POA: Diagnosis not present

## 2019-05-25 DIAGNOSIS — H903 Sensorineural hearing loss, bilateral: Secondary | ICD-10-CM

## 2019-05-25 DIAGNOSIS — H9313 Tinnitus, bilateral: Secondary | ICD-10-CM

## 2019-05-25 DIAGNOSIS — G8929 Other chronic pain: Secondary | ICD-10-CM | POA: Diagnosis not present

## 2019-05-25 DIAGNOSIS — M6281 Muscle weakness (generalized): Secondary | ICD-10-CM | POA: Diagnosis not present

## 2019-05-25 DIAGNOSIS — M25511 Pain in right shoulder: Secondary | ICD-10-CM | POA: Diagnosis not present

## 2019-05-25 DIAGNOSIS — R293 Abnormal posture: Secondary | ICD-10-CM | POA: Diagnosis not present

## 2019-05-25 NOTE — Patient Instructions (Addendum)
Outpatient Audiology and [place], [place]  [address] [phone] __________________________________________________________________________________________________________  Suggestions for Effective Communication   A. Pay Attention to Your Environment 1. Try to keep background noise to a minimum. 2. Position yourself within six to ten feet and where you can see the speaker's face. If you have a better-hearing ear, try to keep it towards the speaker. 3. Let the speaker know you have a hearing loss and that you will understand the conversation better if he or she is facing you. 4. Ask the person to speak clearly and distinctly. Overly loud or exaggerated speech is not easier to understand. 5. Remind your family and friends to be sure that they have your attention before speaking to you. Ask them to be in the same room with you and not to shout across a long distance.   B. Be Alert and Develop Good Listening Habits 1. Use your hearing aid. Sometimes you may not notice how much it helps, but others will. 2. Follow along with the speaker and as you become familiar with the rhythm of his or her speech, you will pick up key words that will allow you to make good guesses about parts of the conversation you have missed. Use the following suggestions if you are still not understanding: a) Restate what you heard to eliminate misunderstandings. b) When you did not hear enough to use a key word and the person has repeated the message, ask him or her to say the same thing again using different words. c) If you don't understand a word or a name, even after repetition, ask the speaker to spell it or write it down. d) In group situations, position yourself so that you can see everyone and are not too far away. 3. Be Realistic! You might not always be sure of what was said; but, by using some of these suggestions you should do better. No one, even those with  normal hearing, hears everything all the time.   C. Tips for Difficult Listening Situations 1. Radio or television: These are difficult because you can't always see the faces of the people talking and there may be music accompanying the action. Reduce competing noises as much as possible and consider using assistive devices or closed captioning. 2. Telephone: Most hearing aids have a feature that helps with the telephone or you can purchase a telephone amplifier. Some people use both. Your audiologist will help you decide which arrangement is best for you. 3. Public places (such as house of worship, theaters, etc.): For single-speaker situations, try to sit about six rows from the front and in line with the speaker's face. At entertainment venues, try to sit close enough to the screen or stage to optimize visual cues. Avoid sitting near noisy children and try to avoid sitting under a balcony. Assistive listening devices should be available.   Outpatient Audiology and [place], [place]  [address] [phone] __________________________________________________________________________________________________________  Tips for Talking to Hard of Hearing Persons   1. Face the hard of hearing person directly.  2. Lighting should be directed on the speaker's face.  3. Avoid talking from another room.  4. Be aware that anyone will have more difficulty concentrating when fatigued or ill.  5. Speak naturally. It is more important to speak more slowly rather than more loudly.  6. Keep your hands away from your mouth while talking.  7. If you are eating, chewing, smoking, smiling, while talking, your speech  speech will be more difficult to understand.  8. If a person has difficulty understanding some particular phrase or word, try to find a different way of saying the same thing; rephrase, rather than repeat the original words.  9. Avoid using sentences  that go on too long. Slow down, and wait to make sure that you have been understood before continuing.  10. If you are giving specific information, such as time or place, ask the hard of hearing person to repeat what you said.  11. Avoid sudden changes of topic.  12. Don't drop your voice at the end of sentences.    

## 2019-05-25 NOTE — Procedures (Signed)
  Outpatient Audiology and [place], [place]  [address] [phone]  AUDIOLOGICAL  EVALUATION  NAME: Drew Smith     DOB:   August 30, 1940      MRN: [id number]                                                                                     DATE: 05/25/2019     REFERENT: [name], MD STATUS: Outpatient DIAGNOSIS: Tinnitus, Sensorineural Hearing Loss, Bilateral     History: Mr. [name] was seen today for an Audiological Evaluation. He reports bothersome, constant, tinnitus occurring for approximately 1 year. He reports the tinnitus worsens throughout the day. Mr. [name] reports intermittent episodes of dizziness which he describes as feeling off-balanced. He denies aural fullness and otalgia. Mr. [name] denies hearing concerns. Mr. [name] has a history of concussions from playing hockey and reports a history of noise exposure.   Evaluation:   Otoscopy showed a clear view of the tympanic membranes, bilaterally  Tympanometry results were consistent with normal middle ear function, bilaterally.   Ipsilateral Acoustic Reflex Thresholds were present and within the normal range at [phone] Hz in the left ear. Reflexes were present and within the normal limit in the right ear at [phone] Hz, elevated at 2000 Hz and absent at 4000 Hz.   Audiometric testing was completed using Conventional Audiometry techniques with insert earphones and TDH headphones. Results are consistent with normal hearing sensitivity sloping to a moderately-severe sensorineural hearing loss in the left ear and normal hearing sloping to a severe hearing loss in the right ear. A Speech Recognition Threshold (SRT) was obtained at 20 dB HL, bilaterally. Word Recognition Testing was completed at 70 dBHL and Mr. [name] scored 92% in the right ear and 98% in the left ear.   Results:  Today's results are consistent with normal hearing sensitivity sloping to a  moderately-severe sensorineural hearing loss in the left ear and normal hearing sloping to a severe hearing loss in the right ear. Mr. [name] will have difficulty hearing in many listening environments and will benefit from the use of good communication strategies and the use of assistive listening devices. Effective Communication Strategies were reviewed with Mr. [name]. Tinnitus management strategies were reviewed. The test results and recommendations were reviewed with Mr. [name].   Recommendations: 1. Referral to an Ear, Nose, and Throat Physician for bilateral Tinnitus 2. Referral to [facility] for Tinnitus Management.   Bari Mantis Audiologist, Au.D., CCC-A 05/25/2019  2:06 PM   Cc: [name], MD    Cc: [name], MD

## 2019-05-26 ENCOUNTER — Encounter: Payer: Self-pay | Admitting: Physical Therapy

## 2019-05-26 ENCOUNTER — Ambulatory Visit: Payer: Medicare Other | Admitting: Physical Therapy

## 2019-05-26 DIAGNOSIS — M6281 Muscle weakness (generalized): Secondary | ICD-10-CM | POA: Diagnosis not present

## 2019-05-26 DIAGNOSIS — R252 Cramp and spasm: Secondary | ICD-10-CM

## 2019-05-26 DIAGNOSIS — H9313 Tinnitus, bilateral: Secondary | ICD-10-CM | POA: Diagnosis not present

## 2019-05-26 DIAGNOSIS — R293 Abnormal posture: Secondary | ICD-10-CM | POA: Diagnosis not present

## 2019-05-26 DIAGNOSIS — G8929 Other chronic pain: Secondary | ICD-10-CM

## 2019-05-26 DIAGNOSIS — M25511 Pain in right shoulder: Secondary | ICD-10-CM | POA: Diagnosis not present

## 2019-05-26 DIAGNOSIS — H903 Sensorineural hearing loss, bilateral: Secondary | ICD-10-CM | POA: Diagnosis not present

## 2019-05-26 NOTE — Therapy (Signed)
[facility] [address] [place], [place], [address] Phone: [phone]   Fax:  [phone]  Physical Therapy Treatment  Patient Details  Name: Drew Smith MRN: [id number] Date of Birth: [birth year] Referring Provider (PT): Gregor Hams, MD   Encounter Date: 05/26/2019  PT End of Session - 05/26/19 1154    Visit Number  4    Date for PT Re-Evaluation  06/25/19    Authorization Type  [employer]HC Medicare    PT Start Time  1150    PT Stop Time  1237    PT Time Calculation (min)  47 min    Activity Tolerance  Patient tolerated treatment well    Behavior During Therapy  [facility] for tasks assessed/performed       Past Medical History:  Diagnosis Date  . Arthritis   . Asthma   . Cancer [facility])    prostate  . GERD (gastroesophageal reflux disease)   . Guillain-Barre syndrome ([place]) 2000  . Hyperlipidemia   . Hypertension     Past Surgical History:  Procedure Laterality Date  . EYE SURGERY  1777   79 years old  . HERNIA REPAIR  1995  . NOSE SURGERY  1990  . PROSTATE SURGERY  2000   seed inplants  . SHOULDER SURGERY  2008    There were no vitals filed for this visit.  Subjective Assessment - 05/26/19 1153    Subjective  Played golf a few times and might be getting a little better.  The wall slides seem to be helping - I can feel the stretching without too much pain.    Limitations  Lifting;House hold activities                       [place] Adult PT Treatment/Exercise - 05/26/19 0001      Exercises   Exercises  Shoulder      Shoulder Exercises: ROM/Strengthening   UBE (Upper Arm Bike)  3x3 L1, PT present to discuss golf and HEP, symptoms    Other ROM/Strengthening Exercises  Rt UE wall slides 10x3 sec holds      Shoulder Exercises: Stretch   Other Shoulder Stretches  doorway stretch 3x15 sec    Other Shoulder Stretches  seated thoracic rotation with UE reach, PT cued to sit tall in rotation  5x5 sec holds bil      Modalities   Modalities  Moist Heat;Electrical Stimulation      Moist Heat Therapy   Number Minutes Moist Heat  10 Minutes   Rt   Moist Heat Location  Shoulder      Electrical Stimulation   Electrical Stimulation Location  Rt shoulder    Electrical Stimulation Action  IFC    Electrical Stimulation Parameters  to tolerance    Electrical Stimulation Goals  Pain       Trigger Point Dry Needling - 05/26/19 0001    Consent Given?  Yes    Education Handout Provided  Yes    Muscles Treated Head and Neck  Upper trapezius    Muscles Treated Upper Quadrant  Infraspinatus;Deltoid    Dry Needling Comments  Rt, posterior deltoid    Upper Trapezius Response  Twitch reponse elicited;Palpable increased muscle length    Infraspinatus Response  Twitch response elicited;Palpable increased muscle length    Deltoid Response  Twitch response elicited;Palpable increased muscle length             PT Short  Term Goals - 05/26/19 1235      PT SHORT TERM GOAL #1   Title  Pt will be ind and compliant with initial HEP    Status  Achieved      PT SHORT TERM GOAL #2   Title  Pt will demo proper postural awareness for cervical and thoracic regions.    Status  On-going      PT SHORT TERM GOAL #3   Title  Pt will improve scapular strenth to at least 4-/5    Status  On-going        PT Long Term Goals - 04/30/19 1228      PT LONG TERM GOAL #1   Title  Pt will be ind in advanced HEP to target improved posture, shoulder stabilization, ROM and strength.    Time  8    Period  Weeks    Status  New    Target Date  06/25/19      PT LONG TERM GOAL #2   Title  Pt will be able to play a round of golf with pain not to exceed 3/10.    Time  8    Period  Weeks    Status  New    Target Date  06/25/19      PT LONG TERM GOAL #3   Title  Pt will achieve Rt shoulder A/ROM for flexion to at least 125 for improved reaching tasks.    Time  8    Period  Weeks    Status  New     Target Date  06/25/19      PT LONG TERM GOAL #4   Title  Pt will reduce FOTO score to </= 33% to demo less limitation.    Baseline  44%    Time  8    Period  Weeks    Status  New    Target Date  06/25/19      PT LONG TERM GOAL #5   Title  Pt will demo at least 4+/5 for scapular stabilizers and Rt shoulder for improved tolerance of golf and other UE dependent activities.    Time  8    Period  Weeks    Status  New    Target Date  06/25/19            Plan - 05/26/19 1232    Clinical Impression Statement  Pt arrived with report of slight improvement in range and pain levels with functional use of Rt UE.  PT performed DN to Rt upper trap, infraspinatus and posterior deltoid all of which displayed signif twitch and release.  Pt with soreness but improved resting position of Rt shoulder girdle end of session.  PT used IFC and heat to help with soreness and provided aftercare instructions for DN.  Pt will continue to benefit from skilled PT along POC.    Stability/Clinical Decision Making  Stable/Uncomplicated    Rehab Potential  Good    PT Frequency  1x / week    PT Duration  8 weeks    PT Next Visit Plan  f/u on DN #1, pec release (bruises easily with STM), AC and GH joint mobs, scapular strength    PT Home Exercise Plan  Access Code: [email or web address]    Consulted and Agree with Plan of Care  Patient       Patient will benefit from skilled therapeutic intervention in order to improve the following deficits and impairments:  Decreased range of  motion, Increased fascial restricitons, Increased muscle spasms, Impaired UE functional use, Pain, Hypomobility, Impaired flexibility, Improper body mechanics, Postural dysfunction, Decreased strength, Decreased mobility  Visit Diagnosis: Chronic right shoulder pain  Abnormal posture  Muscle weakness (generalized)  Cramp and spasm     Problem List Patient Active Problem List   Diagnosis Date Noted  . Upper respiratory tract infection  02/14/2017  . Porokeratosis 09/07/2014  . Metatarsal deformity 09/07/2014  . Pain in lower limb 09/07/2014  . Squamous cell carcinoma in situ of skin 10/15/2013  . Hives 10/06/2012  . History of Guillain-Barre syndrome 03/07/2011  . Hypertension 11/28/2010  . Dyslipidemia 11/28/2010  . ERECTILE DYSFUNCTION 01/05/2008  . ASTHMA 10/28/2007  . GERD 10/28/2007  . DIZZINESS 10/28/2007  . COUGH 10/28/2007  . FASTING HYPERGLYCEMIA 10/28/2007  . PROSTATE CANCER, HX OF 10/28/2007    Baruch Merl, PT 05/26/19 12:36 PM   [facility] [address], [place], [place], [address] Phone: [phone]   Fax:  [phone]  Name: BONIFACE ROTUNNO MRN: [id number] Date of Birth: 1940-11-04

## 2019-05-26 NOTE — Patient Instructions (Signed)

## 2019-06-01 ENCOUNTER — Ambulatory Visit: Payer: Medicare Other | Admitting: Physical Therapy

## 2019-06-08 ENCOUNTER — Ambulatory Visit: Payer: Medicare Other | Attending: Family Medicine | Admitting: Physical Therapy

## 2019-06-08 ENCOUNTER — Other Ambulatory Visit: Payer: Self-pay

## 2019-06-08 ENCOUNTER — Encounter: Payer: Self-pay | Admitting: Physical Therapy

## 2019-06-08 DIAGNOSIS — G8929 Other chronic pain: Secondary | ICD-10-CM | POA: Diagnosis not present

## 2019-06-08 DIAGNOSIS — M6281 Muscle weakness (generalized): Secondary | ICD-10-CM | POA: Diagnosis not present

## 2019-06-08 DIAGNOSIS — M25511 Pain in right shoulder: Secondary | ICD-10-CM | POA: Diagnosis not present

## 2019-06-08 DIAGNOSIS — R293 Abnormal posture: Secondary | ICD-10-CM | POA: Diagnosis not present

## 2019-06-08 NOTE — Therapy (Signed)
[facility] [address], [place], [place], [address] Phone: [phone]   Fax:  [phone]  Physical Therapy Treatment  Patient Details  Name: Drew Smith MRN: [id number] Date of Birth: [birth year] Referring Provider (PT): Gregor Hams, MD   Encounter Date: 06/08/2019  PT End of Session - 06/08/19 1939    Visit Number  5    Date for PT Re-Evaluation  06/25/19    Authorization Type  [employer] Medicare    PT Start Time  1626   Pt late   PT Stop Time  1704    PT Time Calculation (min)  38 min    Activity Tolerance  Patient tolerated treatment well    Behavior During Therapy  [facility] for tasks assessed/performed       Past Medical History:  Diagnosis Date  . Arthritis   . Asthma   . Cancer [facility])    prostate  . GERD (gastroesophageal reflux disease)   . Guillain-Barre syndrome ([place]) 2000  . Hyperlipidemia   . Hypertension     Past Surgical History:  Procedure Laterality Date  . EYE SURGERY  2393   79 years old  . HERNIA REPAIR  1995  . NOSE SURGERY  1990  . PROSTATE SURGERY  2000   seed inplants  . SHOULDER SURGERY  2008    There were no vitals filed for this visit.  Subjective Assessment - 06/08/19 1630    Subjective  25% improvement in right shoulder pain.  Feeling a little better during golf.    Limitations  Lifting;House hold activities    Diagnostic tests  MRI 04/20/19: AC joint severe DJD, old bicep tendon tear likely, partial tears and tendinopathy of RC likely    Patient Stated Goals  play golf, reduce pain    Currently in Pain?  Yes    Pain Score  3     Pain Location  Shoulder    Pain Orientation  Right;Lateral;Posterior    Pain Descriptors / Indicators  Dull;Aching    Pain Type  Chronic pain    Pain Onset  More than a month ago    Pain Frequency  Intermittent    Aggravating Factors   golf, overhead reach, dressing, lift    Pain Relieving Factors  OTC meds    Effect of Pain on Daily  Activities  golf, dressing, sleep, overhead tasks         [facility] PT Assessment - 06/08/19 0001      Posture/Postural Control   Postural Limitations  Forward head;Rounded Shoulders;Increased thoracic kyphosis;Flexed trunk      AROM   Right Shoulder Flexion  115 Degrees                   [facility] Adult PT Treatment/Exercise - 06/08/19 0001      Exercises   Exercises  Shoulder      Neck Exercises: Supine   Neck Retraction Limitations  throughout supine ther ex      Shoulder Exercises: Supine   Horizontal ABduction  Strengthening;Both;15 reps;Theraband    Theraband Level (Shoulder Horizontal ABduction)  Level 2 (Red)    External Rotation  Strengthening;Both;15 reps;Theraband    Theraband Level (Shoulder External Rotation)  Level 2 (Red)    Diagonals  Strengthening;Both;10 reps;Theraband    Theraband Level (Shoulder Diagonals)  Level 2 (Red)      Shoulder Exercises: Seated   Other Seated Exercises  seated trunk rotation stretch 3x5 sec each way  Shoulder Exercises: ROM/Strengthening   UBE (Upper Arm Bike)  2x2 L2, PT present to review LTGs    Proximal Shoulder Strengthening, Supine  3# circles 2x10 each way on Rt supine    Other ROM/Strengthening Exercises  8# serratus press supine x 10 reps Rt    Other ROM/Strengthening Exercises  thoracic extension over soft foam roller x 2'      Shoulder Exercises: Stretch   Other Shoulder Stretches  doorway stretch 3x15 sec    Other Shoulder Stretches  seated thoracic rotation with UE reach, PT cued to sit tall in rotation 5x5 sec holds bil             PT Education - 06/08/19 1702    Education Details  Access Code: [email or web address]    Person(s) Educated  Patient    Methods  Explanation;Demonstration    Comprehension  Verbalized understanding;Returned demonstration       PT Short Term Goals - 06/08/19 1945      PT SHORT TERM GOAL #1   Title  Pt will be ind and compliant with initial HEP    Status  Achieved      PT  SHORT TERM GOAL #2   Title  Pt will demo proper postural awareness for cervical and thoracic regions.    Baseline  stiffness limits ideal posture but Pt aware of how to achieve his best posture within mobility limits    Status  Achieved      PT SHORT TERM GOAL #3   Title  Pt will improve scapular strenth to at least 4-/5    Status  Achieved        PT Long Term Goals - 06/08/19 1632      PT LONG TERM GOAL #1   Title  Pt will be ind in advanced HEP to target improved posture, shoulder stabilization, ROM and strength.    Status  On-going      PT LONG TERM GOAL #2   Title  Pt will be able to play a round of golf with pain not to exceed 3/10.    Baseline  up to 6-7/10    Status  On-going      PT LONG TERM GOAL #3   Title  Pt will achieve Rt shoulder A/ROM for flexion to at least 125 for improved reaching tasks.      PT LONG TERM GOAL #4   Title  Pt will reduce FOTO score to </= 33% to demo less limitation.    Status  On-going      PT LONG TERM GOAL #5   Title  Pt will demo at least 4+/5 for scapular stabilizers and Rt shoulder for improved tolerance of golf and other UE dependent activities.    Status  On-going            Plan - 06/08/19 1939    Clinical Impression Statement  Pt reports 25% improvement. Golf game is improving slightly, noting more range and less pain, although pain spikes by end of a round of golf to a 7-8/10.  Rt shoulder A/ROM flexion measured 115 deg today.  Session focused on ROM, stretching and progression of strength.  PT introduced thoracic extension over foam roller today.  Pt with spinal rigidity in thoracic spine with increased kyphotic posture which contributes to lack of end range shoulder flexion.  Pt reported slight increase in pain with ther ex today.  Pt will continue to benefit from manual techniques, ROM, strength and stretching to maximize  moblity and minimize pain with desired activites such as golf.    Examination-Activity Limitations  Reach  Overhead;Carry;Sleep;Lift    PT Next Visit Plan  UBE, Rt shoulder DN if needed, Rt shoulder mobs, thoracic mobs, review supine red band horiz abd, diag, ER, 3# circles, multi-directional rows, standing band ext    PT Home Exercise Plan  Access Code: [email or web address]    Consulted and Agree with Plan of Care  Patient       Patient will benefit from skilled therapeutic intervention in order to improve the following deficits and impairments:     Visit Diagnosis: Chronic right shoulder pain  Abnormal posture  Muscle weakness (generalized)     Problem List Patient Active Problem List   Diagnosis Date Noted  . Upper respiratory tract infection 02/14/2017  . Porokeratosis 09/07/2014  . Metatarsal deformity 09/07/2014  . Pain in lower limb 09/07/2014  . Squamous cell carcinoma in situ of skin 10/15/2013  . Hives 10/06/2012  . History of Guillain-Barre syndrome 03/07/2011  . Hypertension 11/28/2010  . Dyslipidemia 11/28/2010  . ERECTILE DYSFUNCTION 01/05/2008  . ASTHMA 10/28/2007  . GERD 10/28/2007  . DIZZINESS 10/28/2007  . COUGH 10/28/2007  . FASTING HYPERGLYCEMIA 10/28/2007  . PROSTATE CANCER, HX OF 10/28/2007    Baruch Merl, PT 06/08/19 7:48 PM   [facility] [address], [place], [place], [address] Phone: [phone]   Fax:  [phone]  Name: VEASNA GOROSPE MRN: [id number] Date of Birth: [birth year]

## 2019-06-08 NOTE — Patient Instructions (Signed)
Access Code: [email or web address] URL: [email or web address]: 04/13/2021Prepared by: Venetia Night BeuhringExercises  Cervical Retraction with Overpressure - 1 x daily - 7 x weekly - 10 reps - 2 sets - 10 hold  Seated Scapular Retraction - 2 x daily - 7 x weekly - 5 reps - 2 sets - 5 hold  Seated Upper Trapezius Stretch - 1 x daily - 7 x weekly - 3 reps - 1 sets - 30 hold  Shoulder Flexion Overhead with Dowel - 1 x daily - 7 x weekly - 5-10 reps - 1 sets - 5 hold  Doorway Pec Stretch at 90 Degrees Abduction - 2 x daily - 7 x weekly - 2 reps - 1 sets - 15 hold  Shoulder Flexion Wall Slide with Towel - 1 x daily - 7 x weekly - 2 sets - 10 reps - 5 hold  Push-Up on Counter - 1 x daily - 7 x weekly - 2 sets - 15 reps  Supine Shoulder Horizontal Abduction with Resistance - 1 x daily - 7 x weekly - 10 reps - 3 sets  Supine Shoulder External Rotation with Resistance - 1 x daily - 7 x weekly - 10 reps - 3 sets  Standing Shoulder Diagonal Horizontal Abduction 60/120 Degrees with Resistance - 1 x daily - 7 x weekly - 3 sets - 10 reps  Supine Shoulder Circles - 1 x daily - 7 x weekly - 2 sets - 10 reps

## 2019-06-15 ENCOUNTER — Encounter: Payer: Medicare Other | Admitting: Physical Therapy

## 2019-06-16 DIAGNOSIS — M50322 Other cervical disc degeneration at C5-C6 level: Secondary | ICD-10-CM | POA: Diagnosis not present

## 2019-06-16 DIAGNOSIS — M5136 Other intervertebral disc degeneration, lumbar region: Secondary | ICD-10-CM | POA: Diagnosis not present

## 2019-06-16 DIAGNOSIS — M9901 Segmental and somatic dysfunction of cervical region: Secondary | ICD-10-CM | POA: Diagnosis not present

## 2019-06-16 DIAGNOSIS — M9903 Segmental and somatic dysfunction of lumbar region: Secondary | ICD-10-CM | POA: Diagnosis not present

## 2019-06-22 ENCOUNTER — Ambulatory Visit: Payer: Medicare Other | Admitting: Physical Therapy

## 2019-06-22 ENCOUNTER — Other Ambulatory Visit: Payer: Self-pay

## 2019-06-22 ENCOUNTER — Encounter: Payer: Self-pay | Admitting: Physical Therapy

## 2019-06-22 DIAGNOSIS — R293 Abnormal posture: Secondary | ICD-10-CM

## 2019-06-22 DIAGNOSIS — M6281 Muscle weakness (generalized): Secondary | ICD-10-CM

## 2019-06-22 DIAGNOSIS — M25511 Pain in right shoulder: Secondary | ICD-10-CM | POA: Diagnosis not present

## 2019-06-22 DIAGNOSIS — G8929 Other chronic pain: Secondary | ICD-10-CM

## 2019-06-22 NOTE — Therapy (Signed)
[facility] [address] [place], [place], [address] Phone: [phone]   Fax:  [phone]  Physical Therapy Treatment  Patient Details  Name: Drew Smith MRN: [id number] Date of Birth: Feb 04, 1941 Referring Provider (PT): Gregor Hams, MD   Encounter Date: 06/22/2019  PT End of Session - 06/22/19 1700    Visit Number  6    Date for PT Re-Evaluation  08/17/19    Authorization Type  [employer]HC Medicare    PT Start Time  1620    PT Stop Time  1700    PT Time Calculation (min)  40 min    Activity Tolerance  Patient tolerated treatment well    Behavior During Therapy  [facility] for tasks assessed/performed       Past Medical History:  Diagnosis Date  . Arthritis   . Asthma   . Cancer [facility])    prostate  . GERD (gastroesophageal reflux disease)   . Guillain-Barre syndrome ([place]) 2000  . Hyperlipidemia   . Hypertension     Past Surgical History:  Procedure Laterality Date  . EYE SURGERY  3869   79 years old  . HERNIA REPAIR  1995  . NOSE SURGERY  1990  . PROSTATE SURGERY  2000   seed inplants  . SHOULDER SURGERY  2008    There were no vitals filed for this visit.  Subjective Assessment - 06/22/19 1623    Subjective  Over last week Rt shoulder pain has ranged from 2-6/10.  Just played golf and pain only reached 2-4/10.  I can tell I'm geting stronger.  Dressing is better.    Limitations  Lifting;House hold activities    Diagnostic tests  MRI 04/20/19: AC joint severe DJD, old bicep tendon tear likely, partial tears and tendinopathy of RC likely    Patient Stated Goals  play golf, reduce pain    Currently in Pain?  Yes    Pain Score  3     Pain Location  Shoulder    Pain Orientation  Right;Lateral;Posterior    Pain Descriptors / Indicators  Aching;Dull    Pain Type  Chronic pain    Pain Onset  More than a month ago    Pain Frequency  Intermittent    Aggravating Factors   golf, overhead reach, lift    Effect  of Pain on Daily Activities  improved dressing, improving golf,         [facility] PT Assessment - 06/22/19 0001      Assessment   Medical Diagnosis  M25.511,G89.29 (ICD-10-CM) - Chronic right shoulder pain    Referring Provider (PT)  Gregor Hams, MD    Onset Date/Surgical Date  --   > 5 years    Hand Dominance  Right    Next MD Visit  --   30 days   Prior Therapy  for Lt shoulder, had reconstructive surgery      Observation/Other Assessments   Focus on Therapeutic Outcomes (FOTO)   37% from 44% at eval      Posture/Postural Control   Postural Limitations  Forward head;Rounded Shoulders;Increased thoracic kyphosis;Flexed trunk    Posture Comments  Rt scapular winging      AROM   Right Shoulder Flexion  122 Degrees      PROM   Right Shoulder Flexion  130 Degrees      Strength   Overall Strength Comments  4/5 Rt shoulder with excpetion of Rt serratus ant 4-/5  [facility] Adult PT Treatment/Exercise - 06/22/19 0001      Shoulder Exercises: Supine   Protraction  Strengthening;Both;Weights;20 reps    Protraction Weight (lbs)  3lb      Shoulder Exercises: Seated   Horizontal ABduction  Strengthening;Both;15 reps;Theraband    Theraband Level (Shoulder Horizontal ABduction)  Level 3 (Green)    External Rotation  Strengthening;Both;15 reps;Theraband    Theraband Level (Shoulder External Rotation)  Level 3 (Green)      Shoulder Exercises: Sidelying   ABduction  Strengthening;Right;15 reps;Weights    ABduction Weight (lbs)  3      Shoulder Exercises: Standing   Flexion  Strengthening;Both;10 reps;Weights    Flexion Limitations  3#, 3-way flex/diagonal/abd       Shoulder Exercises: ROM/Strengthening   Other ROM/Strengthening Exercises  pulley chops 45# bil 1x15               PT Short Term Goals - 06/08/19 1945      PT SHORT TERM GOAL #1   Title  Pt will be ind and compliant with initial HEP    Status  Achieved      PT SHORT TERM GOAL  #2   Title  Pt will demo proper postural awareness for cervical and thoracic regions.    Baseline  stiffness limits ideal posture but Pt aware of how to achieve his best posture within mobility limits    Status  Achieved      PT SHORT TERM GOAL #3   Title  Pt will improve scapular strenth to at least 4-/5    Status  Achieved        PT Long Term Goals - 06/22/19 1700      PT LONG TERM GOAL #1   Title  Pt will be ind in advanced HEP to target improved posture, shoulder stabilization, ROM and strength.    Baseline  1-2x/day    Time  8    Period  Weeks    Status  On-going    Target Date  08/17/19      PT LONG TERM GOAL #2   Title  Pt will be able to play a round of golf with pain not to exceed 3/10.    Baseline  4/10 for shoulder today    Time  8    Period  Weeks    Status  On-going    Target Date  08/17/19      PT LONG TERM GOAL #3   Title  Pt will achieve Rt shoulder A/ROM for flexion to at least 125 for improved reaching tasks.    Baseline  122    Time  8    Period  Weeks    Status  On-going    Target Date  08/17/19      PT LONG TERM GOAL #4   Title  Pt will reduce FOTO score to </= 33% to demo less limitation.    Baseline  37%, goal 33%    Time  8    Period  Weeks    Status  On-going    Target Date  08/17/19      PT LONG TERM GOAL #5   Title  Pt will demo at least 4+/5 for scapular stabilizers and Rt shoulder for improved tolerance of golf and other UE dependent activities.    Baseline  signif winging Rt scapula with serratus strength 4-/5, 4/5 throughout Rt shoulder    Time  8    Period  Weeks  Status  On-going    Target Date  08/17/19            Plan - 06/22/19 1702    Clinical Impression Statement  Pt reports at least 25% improved in pain with all activities.  Dressing is much easier for him.  Pain ranges from 2/10 to 6/10 with aggravating activities being golf and overhead lifting.  FOTO score reduced to 37% from 44% at eval with goal of 33%.  Rt  shoulder A/ROM for flexion has improved to 122 deg today.  P/ROM shoulder flexion is 130 with capsular end feel.  Rigidity in thoracic kyphosis and forward head contribute to limited overhead UE reaching.  Pt with improving Rt shoudler strength of 4/5 with excpetion of serratus anterior 4-/5 with signif Rt scapular winging present.  Pt is compliant with HEP and would like to taper appointments to every other week as he transitions through golf season.  Pt will benefit from skilled PT to continue working on postural mobility, strength and UE function/mechanics.    Personal Factors and Comorbidities  Age;Time since onset of injury/illness/exacerbation    Examination-Activity Limitations  Reach Overhead;Carry;Lift    Examination-Participation Restrictions  Community Activity    Stability/Clinical Decision Making  Stable/Uncomplicated    Clinical Decision Making  Low    Rehab Potential  Good    PT Frequency  Biweekly    PT Duration  8 weeks    PT Treatment/Interventions  ADLs/Self Care Home Management;Cryotherapy;Electrical Stimulation;Iontophoresis 4mg /ml Dexamethasone;Moist Heat;Traction;Functional mobility training;Therapeutic activities;Therapeutic exercise;Neuromuscular re-education;Manual techniques;Patient/family education;Passive range of motion;Dry needling;Joint Manipulations;Spinal Manipulations;Taping    PT Next Visit Plan  thoracic ROM/stretching/mobs, Rt serratus strength, Rt flexion strength, intro spinal decompression, open book if tolerated    PT Home Exercise Plan  Access Code: [email or web address]    Consulted and Agree with Plan of Care  Patient       Patient will benefit from skilled therapeutic intervention in order to improve the following deficits and impairments:  Decreased range of motion, Increased fascial restricitons, Increased muscle spasms, Impaired UE functional use, Pain, Hypomobility, Impaired flexibility, Improper body mechanics, Postural dysfunction, Decreased strength, Decreased  mobility  Visit Diagnosis: Chronic right shoulder pain - Plan: PT plan of care cert/re-cert  Abnormal posture - Plan: PT plan of care cert/re-cert  Muscle weakness (generalized) - Plan: PT plan of care cert/re-cert     Problem List Patient Active Problem List   Diagnosis Date Noted  . Upper respiratory tract infection 02/14/2017  . Porokeratosis 09/07/2014  . Metatarsal deformity 09/07/2014  . Pain in lower limb 09/07/2014  . Squamous cell carcinoma in situ of skin 10/15/2013  . Hives 10/06/2012  . History of Guillain-Barre syndrome 03/07/2011  . Hypertension 11/28/2010  . Dyslipidemia 11/28/2010  . ERECTILE DYSFUNCTION 01/05/2008  . ASTHMA 10/28/2007  . GERD 10/28/2007  . DIZZINESS 10/28/2007  . COUGH 10/28/2007  . FASTING HYPERGLYCEMIA 10/28/2007  . PROSTATE CANCER, HX OF 10/28/2007    Baruch Merl, PT 06/22/19 5:11 PM   [facility] [address], [place], [place], [address] Phone: [phone]   Fax:  [phone]  Name: Drew Smith MRN: [id number] Date of Birth: Apr 08, 1940

## 2019-06-30 DIAGNOSIS — M5136 Other intervertebral disc degeneration, lumbar region: Secondary | ICD-10-CM | POA: Diagnosis not present

## 2019-06-30 DIAGNOSIS — M50322 Other cervical disc degeneration at C5-C6 level: Secondary | ICD-10-CM | POA: Diagnosis not present

## 2019-06-30 DIAGNOSIS — M9903 Segmental and somatic dysfunction of lumbar region: Secondary | ICD-10-CM | POA: Diagnosis not present

## 2019-06-30 DIAGNOSIS — M9901 Segmental and somatic dysfunction of cervical region: Secondary | ICD-10-CM | POA: Diagnosis not present

## 2019-07-06 ENCOUNTER — Ambulatory Visit: Payer: Medicare Other | Attending: Family Medicine | Admitting: Physical Therapy

## 2019-07-06 ENCOUNTER — Encounter: Payer: Self-pay | Admitting: Physical Therapy

## 2019-07-06 ENCOUNTER — Other Ambulatory Visit: Payer: Self-pay

## 2019-07-06 DIAGNOSIS — R293 Abnormal posture: Secondary | ICD-10-CM | POA: Diagnosis not present

## 2019-07-06 DIAGNOSIS — M25511 Pain in right shoulder: Secondary | ICD-10-CM | POA: Insufficient documentation

## 2019-07-06 DIAGNOSIS — M6281 Muscle weakness (generalized): Secondary | ICD-10-CM | POA: Diagnosis not present

## 2019-07-06 DIAGNOSIS — G8929 Other chronic pain: Secondary | ICD-10-CM | POA: Diagnosis not present

## 2019-07-06 NOTE — Therapy (Signed)
[facility] [address] [place], [place], [address] Phone: [phone]   Fax:  [phone]  Physical Therapy Treatment  Patient Details  Name: Drew Smith MRN: [id number] Date of Birth: 1940-09-21 Referring Provider (PT): Gregor Hams, MD   Encounter Date: 07/06/2019  PT End of Session - 07/06/19 1619    Visit Number  7    Date for PT Re-Evaluation  08/17/19    Authorization Type  UHC Medicare    PT Start Time  [id number]    PT Stop Time  1655    PT Time Calculation (min)  40 min    Activity Tolerance  Patient tolerated treatment well    Behavior During Therapy  [facility] for tasks assessed/performed       Past Medical History:  Diagnosis Date  . Arthritis   . Asthma   . Cancer [facility])    prostate  . GERD (gastroesophageal reflux disease)   . Guillain-Barre syndrome ([place]) 2000  . Hyperlipidemia   . Hypertension     Past Surgical History:  Procedure Laterality Date  . EYE SURGERY  7357   79 years old  . HERNIA REPAIR  1995  . NOSE SURGERY  1990  . PROSTATE SURGERY  2000   seed inplants  . SHOULDER SURGERY  2008    There were no vitals filed for this visit.  Subjective Assessment - 07/06/19 1617    Subjective  Pain has improved and strength is getting better.  Played golf yesterday and pain only got up to 3/10.    Limitations  Lifting;House hold activities    Diagnostic tests  MRI 04/20/19: AC joint severe DJD, old bicep tendon tear likely, partial tears and tendinopathy of RC likely    Patient Stated Goals  golf, reduce pain    Currently in Pain?  Yes    Pain Score  2     Pain Location  Shoulder    Pain Orientation  Right;Posterior    Pain Descriptors / Indicators  Aching;Dull    Pain Type  Chronic pain    Pain Onset  More than a month ago    Pain Frequency  Intermittent    Aggravating Factors   overhead reach, lift    Pain Relieving Factors  OTC meds    Effect of Pain on Daily Activities  improved  dressing, improving golf                       [facility] Adult PT Treatment/Exercise - 07/06/19 0001      Exercises   Exercises  Shoulder      Shoulder Exercises: Supine   Protraction  Strengthening;Right;Left;20 reps;Weights    Protraction Weight (lbs)  3    External Rotation  Strengthening;Both;Theraband;20 reps    Theraband Level (Shoulder External Rotation)  Level 4 (Blue)    Diagonals  Strengthening;Left;Right;10 reps;Weights    Diagonals Weight (lbs)  3      Shoulder Exercises: Standing   Horizontal ABduction  Strengthening;Both;20 reps;Theraband    Theraband Level (Shoulder Horizontal ABduction)  Level 4 (Blue)    Horizontal ABduction Limitations  PT cued tall thoracic spine    Other Standing Exercises  3# ankle weight wall slides Rt and Lt x 10 reps flexion and scaption, each      Shoulder Exercises: ROM/Strengthening   UBE (Upper Arm Bike)  3x3 fwd/bwd, PT present to discuss progress    Cybex Row Limitations  35# bil  row x 20 reps, PT gave TCs to scapular retractors/depressors   some posterior shoulder pain on Rt   Other ROM/Strengthening Exercises  pulley chops 35# bil 1x15      Manual Therapy   Manual Therapy  Soft tissue mobilization    Soft tissue mobilization  Rt infraspinatus in sitting after DN to avoid neck pain in prone after DN       Trigger Point Dry Needling - 07/06/19 0001    Consent Given?  Yes    Education Handout Provided  Previously provided    Muscles Treated Upper Quadrant  Infraspinatus    Dry Needling Comments  Rt    Infraspinatus Response  Twitch response elicited;Palpable increased muscle length   signif twitching            PT Short Term Goals - 06/08/19 1945      PT SHORT TERM GOAL #1   Title  Pt will be ind and compliant with initial HEP    Status  Achieved      PT SHORT TERM GOAL #2   Title  Pt will demo proper postural awareness for cervical and thoracic regions.    Baseline  stiffness limits ideal posture but  Pt aware of how to achieve his best posture within mobility limits    Status  Achieved      PT SHORT TERM GOAL #3   Title  Pt will improve scapular strenth to at least 4-/5    Status  Achieved        PT Long Term Goals - 07/06/19 1619      PT LONG TERM GOAL #1   Title  Pt will be ind in advanced HEP to target improved posture, shoulder stabilization, ROM and strength.    Status  On-going      PT LONG TERM GOAL #2   Title  Pt will be able to play a round of golf with pain not to exceed 3/10.    Status  Achieved      PT LONG TERM GOAL #3   Title  Pt will achieve Rt shoulder A/ROM for flexion to at least 125 for improved reaching tasks.      PT LONG TERM GOAL #4   Title  Pt will reduce FOTO score to </= 33% to demo less limitation.    Baseline  37%, goal 33%    Status  On-going      PT LONG TERM GOAL #5   Title  Pt will demo at least 4+/5 for scapular stabilizers and Rt shoulder for improved tolerance of golf and other UE dependent activities.    Baseline  signif winging Rt scapula with serratus strength 4-/5, 4/5 throughout Rt shoulder    Status  On-going            Plan - 07/06/19 1710    Clinical Impression Statement  Pt with consistent functional Rt shoulder reaching of 120-125 deg for A/ROM, 130 P/ROM with pain.  He reports overall reduction in pain even with golf, reaching a 3/10 yesterday while playing.  Pt continues to have weakness with proximal stability challenges of shoulder girdle and signif weakness of Rt serratus anterior causing scapular winging.  He is quick to fatigue with serratus punches.  Pt arrived with 2/10 pain which increased with strength ther ex today.  PT identified signif trigger point in Rt infraspinatus which demo'd signif twitch and release with DN.  Pt reminded of after care for DN end of session.  Pt is pleased with his progress and compliant with HEP.  He will continue to benefit from skilled PT to improve strength and function of Rt UE.     Rehab Potential  Good    PT Frequency  Biweekly    PT Duration  8 weeks    PT Treatment/Interventions  ADLs/Self Care Home Management;Cryotherapy;Electrical Stimulation;Iontophoresis 4mg /ml Dexamethasone;Moist Heat;Traction;Functional mobility training;Therapeutic activities;Therapeutic exercise;Neuromuscular re-education;Manual techniques;Patient/family education;Passive range of motion;Dry needling;Joint Manipulations;Spinal Manipulations;Taping    PT Next Visit Plan  thoracic ROM/stretching/mobs, Rt serratus strength, Rt flexion strength, intro spinal decompression, open book if tolerated    PT Home Exercise Plan  Access Code: [email or web address]    Consulted and Agree with Plan of Care  Patient       Patient will benefit from skilled therapeutic intervention in order to improve the following deficits and impairments:     Visit Diagnosis: Chronic right shoulder pain  Abnormal posture  Muscle weakness (generalized)     Problem List Patient Active Problem List   Diagnosis Date Noted  . Upper respiratory tract infection 02/14/2017  . Porokeratosis 09/07/2014  . Metatarsal deformity 09/07/2014  . Pain in lower limb 09/07/2014  . Squamous cell carcinoma in situ of skin 10/15/2013  . Hives 10/06/2012  . History of Guillain-Barre syndrome 03/07/2011  . Hypertension 11/28/2010  . Dyslipidemia 11/28/2010  . ERECTILE DYSFUNCTION 01/05/2008  . ASTHMA 10/28/2007  . GERD 10/28/2007  . DIZZINESS 10/28/2007  . COUGH 10/28/2007  . FASTING HYPERGLYCEMIA 10/28/2007  . PROSTATE CANCER, HX OF 10/28/2007    Baruch Merl, PT 07/06/19 5:14 PM    [facility] [address], [place], [place], [address] Phone: [phone]   Fax:  [phone]  Name: JOYCE ELLERBE MRN: [id number] Date of Birth: 06-Jan-1941

## 2019-07-14 DIAGNOSIS — M5136 Other intervertebral disc degeneration, lumbar region: Secondary | ICD-10-CM | POA: Diagnosis not present

## 2019-07-14 DIAGNOSIS — M50322 Other cervical disc degeneration at C5-C6 level: Secondary | ICD-10-CM | POA: Diagnosis not present

## 2019-07-14 DIAGNOSIS — M9903 Segmental and somatic dysfunction of lumbar region: Secondary | ICD-10-CM | POA: Diagnosis not present

## 2019-07-14 DIAGNOSIS — M9901 Segmental and somatic dysfunction of cervical region: Secondary | ICD-10-CM | POA: Diagnosis not present

## 2019-07-20 ENCOUNTER — Ambulatory Visit: Payer: Medicare Other | Admitting: Physical Therapy

## 2019-07-20 ENCOUNTER — Other Ambulatory Visit: Payer: Self-pay

## 2019-07-20 ENCOUNTER — Encounter: Payer: Self-pay | Admitting: Physical Therapy

## 2019-07-20 DIAGNOSIS — M6281 Muscle weakness (generalized): Secondary | ICD-10-CM | POA: Diagnosis not present

## 2019-07-20 DIAGNOSIS — G8929 Other chronic pain: Secondary | ICD-10-CM | POA: Diagnosis not present

## 2019-07-20 DIAGNOSIS — M25511 Pain in right shoulder: Secondary | ICD-10-CM | POA: Diagnosis not present

## 2019-07-20 DIAGNOSIS — R293 Abnormal posture: Secondary | ICD-10-CM

## 2019-07-20 NOTE — Therapy (Signed)
[facility] [address], [place], [place], [address] Phone: [phone]   Fax:  [phone]  Physical Therapy Treatment  Patient Details  Name: Drew Smith MRN: [id number] Date of Birth: 1940/06/06 Referring Provider (PT): Gregor Hams, MD   Encounter Date: 07/20/2019  PT End of Session - 07/20/19 1614    Visit Number  8    Date for PT Re-Evaluation  08/17/19    Authorization Type  UHC Medicare    PT Start Time  [id number]    PT Stop Time  1650    PT Time Calculation (min)  40 min    Activity Tolerance  Patient tolerated treatment well    Behavior During Therapy  [facility] for tasks assessed/performed       Past Medical History:  Diagnosis Date  . Arthritis   . Asthma   . Cancer [facility])    prostate  . GERD (gastroesophageal reflux disease)   . Guillain-Barre syndrome ([place]) 2000  . Hyperlipidemia   . Hypertension     Past Surgical History:  Procedure Laterality Date  . EYE SURGERY  3422   79 years old  . HERNIA REPAIR  1995  . NOSE SURGERY  1990  . PROSTATE SURGERY  2000   seed inplants  . SHOULDER SURGERY  2008    There were no vitals filed for this visit.  Subjective Assessment - 07/20/19 1612    Subjective  My shoulder is worn out today from doing yard work.  It's been doing pretty good.    Limitations  Lifting;House hold activities    Diagnostic tests  MRI 04/20/19: AC joint severe DJD, old bicep tendon tear likely, partial tears and tendinopathy of RC likely    Patient Stated Goals  golf, reduce pain    Currently in Pain?  Yes    Pain Score  2     Pain Location  Shoulder    Pain Orientation  Right;Posterior    Pain Descriptors / Indicators  Aching    Pain Onset  More than a month ago    Pain Frequency  Intermittent    Effect of Pain on Daily Activities  improved dressing, improving golf                        [facility] Adult PT Treatment/Exercise - 07/20/19 0001      Exercises   Exercises  Shoulder      Shoulder Exercises: Supine   Protraction  Strengthening;Both;15 reps;Weights    Protraction Weight (lbs)  3    Horizontal ABduction  Strengthening;Both;Theraband;15 reps    Theraband Level (Shoulder Horizontal ABduction)  Level 3 (Green)    External Rotation  Strengthening;Both;15 reps;Theraband    Theraband Level (Shoulder External Rotation)  Level 4 (Blue)    Diagonals  Strengthening;Both;10 reps;Theraband    Theraband Level (Shoulder Diagonals)  Level 3 (Green)      Shoulder Exercises: Standing   Flexion  Both;15 reps      Shoulder Exercises: ROM/Strengthening   UBE (Upper Arm Bike)  L1.5, 2x2 fwd/bwd, PT present to discuss symptom pattern    [place]  flexion x 15 reps, Rt    Proximal Shoulder Strengthening, Supine  2lb circles CW and CCW 3x10, bil      Manual Therapy   Manual Therapy  Soft tissue mobilization;Joint mobilization;Manual Traction    Joint Mobilization  Rt A/C joint A/P glide of acromion Gr II/III, Rt GH joint posterior  glide and distraction Gr II/III ocsillating for pain modulation, gentle upper cervical nodding    Soft tissue mobilization  Rt upper trap, pectorals    Manual Traction  Gr I/II cervical with scalene strumming bil 3x30 sec                PT Short Term Goals - 06/08/19 1945      PT SHORT TERM GOAL #1   Title  Pt will be ind and compliant with initial HEP    Status  Achieved      PT SHORT TERM GOAL #2   Title  Pt will demo proper postural awareness for cervical and thoracic regions.    Baseline  stiffness limits ideal posture but Pt aware of how to achieve his best posture within mobility limits    Status  Achieved      PT SHORT TERM GOAL #3   Title  Pt will improve scapular strenth to at least 4-/5    Status  Achieved        PT Long Term Goals - 07/06/19 1619      PT LONG TERM GOAL #1   Title  Pt will be ind in advanced HEP to target improved posture, shoulder stabilization, ROM and strength.    Status   On-going      PT LONG TERM GOAL #2   Title  Pt will be able to play a round of golf with pain not to exceed 3/10.    Status  Achieved      PT LONG TERM GOAL #3   Title  Pt will achieve Rt shoulder A/ROM for flexion to at least 125 for improved reaching tasks.      PT LONG TERM GOAL #4   Title  Pt will reduce FOTO score to </= 33% to demo less limitation.    Baseline  37%, goal 33%    Status  On-going      PT LONG TERM GOAL #5   Title  Pt will demo at least 4+/5 for scapular stabilizers and Rt shoulder for improved tolerance of golf and other UE dependent activities.    Baseline  signif winging Rt scapula with serratus strength 4-/5, 4/5 throughout Rt shoulder    Status  On-going            Plan - 07/20/19 1653    Clinical Impression Statement  Pt making good progress toward LTGs.  He reports pain ranges from 2-3/10 even with golf.  He continues to have postural rigidity in cervical and thoracic spine contributing to limited full shoulder range for flexion.  He arrived fatigued from yard work so PT focused on manual therapy for joint mobility of Rt shoulder complex and cervical spine for posture.  Reviewed supine ther ex which Pt performed without pain or difficulty. He is very compliant with HEP and is also seeing his chiropractor every other week.  Pt has tapered visits to every other week and is on track to d/c after 2 more visits.    Rehab Potential  Good    PT Frequency  Biweekly    PT Duration  8 weeks    PT Treatment/Interventions  ADLs/Self Care Home Management;Cryotherapy;Electrical Stimulation;Iontophoresis 4mg /ml Dexamethasone;Moist Heat;Traction;Functional mobility training;Therapeutic activities;Therapeutic exercise;Neuromuscular re-education;Manual techniques;Patient/family education;Passive range of motion;Dry needling;Joint Manipulations;Spinal Manipulations;Taping    PT Next Visit Plan  UBE, doorway wall slides, gentle cervical traction/nodding, Rt GH joint oscillating  traction/posterior glide Gr I-III, STM Rt pectorals/upper trap, ther ex along [employer] or from  2 visits ago if Pt not so fatigued on arrival    PT Home Exercise Plan  Access Code: [email or web address]    Consulted and Agree with Plan of Care  Patient       Patient will benefit from skilled therapeutic intervention in order to improve the following deficits and impairments:  Decreased range of motion, Increased fascial restricitons, Increased muscle spasms, Impaired UE functional use, Pain, Hypomobility, Impaired flexibility, Improper body mechanics, Postural dysfunction, Decreased strength, Decreased mobility  Visit Diagnosis: Chronic right shoulder pain  Abnormal posture  Muscle weakness (generalized)     Problem List Patient Active Problem List   Diagnosis Date Noted  . Upper respiratory tract infection 02/14/2017  . Porokeratosis 09/07/2014  . Metatarsal deformity 09/07/2014  . Pain in lower limb 09/07/2014  . Squamous cell carcinoma in situ of skin 10/15/2013  . Hives 10/06/2012  . History of Guillain-Barre syndrome 03/07/2011  . Hypertension 11/28/2010  . Dyslipidemia 11/28/2010  . ERECTILE DYSFUNCTION 01/05/2008  . ASTHMA 10/28/2007  . GERD 10/28/2007  . DIZZINESS 10/28/2007  . COUGH 10/28/2007  . FASTING HYPERGLYCEMIA 10/28/2007  . PROSTATE CANCER, HX OF 10/28/2007    Baruch Merl, PT 07/20/19 4:58 PM   [facility] [address], [place], [place], [address] Phone: [phone]   Fax:  [phone]  Name: Drew Smith MRN: [id number] Date of Birth: August 31, 1940

## 2019-07-28 DIAGNOSIS — M9901 Segmental and somatic dysfunction of cervical region: Secondary | ICD-10-CM | POA: Diagnosis not present

## 2019-07-28 DIAGNOSIS — M50322 Other cervical disc degeneration at C5-C6 level: Secondary | ICD-10-CM | POA: Diagnosis not present

## 2019-07-28 DIAGNOSIS — M5136 Other intervertebral disc degeneration, lumbar region: Secondary | ICD-10-CM | POA: Diagnosis not present

## 2019-07-28 DIAGNOSIS — M9903 Segmental and somatic dysfunction of lumbar region: Secondary | ICD-10-CM | POA: Diagnosis not present

## 2019-08-04 ENCOUNTER — Other Ambulatory Visit: Payer: Self-pay | Admitting: Family Medicine

## 2019-08-05 ENCOUNTER — Other Ambulatory Visit: Payer: Self-pay

## 2019-08-05 ENCOUNTER — Ambulatory Visit: Payer: Medicare Other | Attending: Family Medicine | Admitting: Physical Therapy

## 2019-08-05 DIAGNOSIS — M25511 Pain in right shoulder: Secondary | ICD-10-CM | POA: Diagnosis not present

## 2019-08-05 DIAGNOSIS — M6281 Muscle weakness (generalized): Secondary | ICD-10-CM | POA: Diagnosis not present

## 2019-08-05 DIAGNOSIS — G8929 Other chronic pain: Secondary | ICD-10-CM | POA: Insufficient documentation

## 2019-08-05 DIAGNOSIS — R293 Abnormal posture: Secondary | ICD-10-CM | POA: Insufficient documentation

## 2019-08-05 DIAGNOSIS — R252 Cramp and spasm: Secondary | ICD-10-CM | POA: Diagnosis not present

## 2019-08-05 NOTE — Therapy (Signed)
[facility] [address] [place], [place], [address] Phone: [phone]   Fax:  [phone]  Physical Therapy Treatment  Patient Details  Name: Drew Smith MRN: [id number] Date of Birth: [birth year] Referring Provider (PT): Gregor Hams, MD   Encounter Date: 08/05/2019   PT End of Session - 08/05/19 1714    Visit Number 9    Date for PT Re-Evaluation 08/17/19    Authorization Type UHC Medicare    PT Start Time 5465    PT Stop Time 1655    PT Time Calculation (min) 40 min    Activity Tolerance Patient tolerated treatment well           Past Medical History:  Diagnosis Date  . Arthritis   . Asthma   . Cancer [facility])    prostate  . GERD (gastroesophageal reflux disease)   . Guillain-Barre syndrome ([place]) 2000  . Hyperlipidemia   . Hypertension     Past Surgical History:  Procedure Laterality Date  . EYE SURGERY  1832   79 years old  . HERNIA REPAIR  1995  . NOSE SURGERY  1990  . PROSTATE SURGERY  2000   seed inplants  . SHOULDER SURGERY  2008    There were no vitals filed for this visit.   Subjective Assessment - 08/05/19 1616    Subjective My shoulder is doing well.  It's helping my golf game.  Did some yard work today.    Diagnostic tests MRI 04/20/19: AC joint severe DJD, old bicep tendon tear likely, partial tears and tendinopathy of RC likely    Currently in Pain? No/denies    Pain Score 0-No pain    Pain Location Shoulder              [facility] PT Assessment - 08/05/19 0001      AROM   Right Shoulder Flexion 110 Degrees    Right Shoulder ABduction 118 Degrees    Right Shoulder Internal Rotation --   T10   Right Shoulder External Rotation 55 Degrees                         [facility] Adult PT Treatment/Exercise - 08/05/19 0001      Neck Exercises: Seated   Other Seated Exercise thoracic extension with ball 15x       Shoulder Exercises: Supine   Protraction  Strengthening;Both;15 reps;Weights    Protraction Weight (lbs) 4    Protraction Limitations propped on wedge     Other Supine Exercises 2# 12:00/6:00 10x back propped on wedge     Other Supine Exercises 2# 9:00/3:00 10x propped on wedge       Shoulder Exercises: Standing   Extension Strengthening;Both;15 reps;Theraband    Theraband Level (Shoulder Extension) Level 3 (Green)    Extension Limitations over the top of the door     Row Strengthening;Both;10 reps;Theraband    Theraband Level (Shoulder Row) Level 3 (Green)      Shoulder Exercises: Stretch   Other Shoulder Stretches doorway stretch 5x with 10  sec      Manual Therapy   Joint Mobilization GH distraction: posterior and inferior mobs grade 2/3 with and without movement     Soft tissue mobilization Rt upper trap, pectorals                  PT Education - 08/05/19 1713    Education Details seated thoracic e  PT Short Term Goals - 06/08/19 1945      PT SHORT TERM GOAL #1   Title Pt will be ind and compliant with initial HEP    Status Achieved      PT SHORT TERM GOAL #2   Title Pt will demo proper postural awareness for cervical and thoracic regions.    Baseline stiffness limits ideal posture but Pt aware of how to achieve his best posture within mobility limits    Status Achieved      PT SHORT TERM GOAL #3   Title Pt will improve scapular strenth to at least 4-/5    Status Achieved             PT Long Term Goals - 07/06/19 1619      PT LONG TERM GOAL #1   Title Pt will be ind in advanced HEP to target improved posture, shoulder stabilization, ROM and strength.    Status On-going      PT LONG TERM GOAL #2   Title Pt will be able to play a round of golf with pain not to exceed 3/10.    Status Achieved      PT LONG TERM GOAL #3   Title Pt will achieve Rt shoulder A/ROM for flexion to at least 125 for improved reaching tasks.      PT LONG TERM GOAL #4   Title Pt will reduce FOTO score to  </= 33% to demo less limitation.    Baseline 37%, goal 33%    Status On-going      PT LONG TERM GOAL #5   Title Pt will demo at least 4+/5 for scapular stabilizers and Rt shoulder for improved tolerance of golf and other UE dependent activities.    Baseline signif winging Rt scapula with serratus strength 4-/5, 4/5 throughout Rt shoulder    Status On-going                 Plan - 08/05/19 1656    Clinical Impression Statement The patient reports good compliance with his HEP since it helps his golf game.  He states he played 3x this week.  He is pleased with his shoulder ROM although flexion remains limited and affected by lack of thoracic extension.  He was given a green band for rows and sholder extensions to add to HEP.  Will assess further progress toward goals next visit to determine readiness for discharge.  Therapist monitoring response with all interventions.    Rehab Potential Good    PT Frequency Biweekly    PT Duration 8 weeks    PT Treatment/Interventions ADLs/Self Care Home Management;Cryotherapy;Electrical Stimulation;Iontophoresis 4mg /ml Dexamethasone;Moist Heat;Traction;Functional mobility training;Therapeutic activities;Therapeutic exercise;Neuromuscular re-education;Manual techniques;Patient/family education;Passive range of motion;Dry needling;Joint Manipulations;Spinal Manipulations;Taping    PT Next Visit Plan 10th visit progress note;  check goals; UBE, doorway wall slides, gentle cervical traction/nodding, Rt GH joint oscillating traction/posterior glide Gr I-III, STM Rt pectorals/upper trap    PT Home Exercise Plan Access Code: [email or web address]           Patient will benefit from skilled therapeutic intervention in order to improve the following deficits and impairments:  Decreased range of motion, Increased fascial restricitons, Increased muscle spasms, Impaired UE functional use, Pain, Hypomobility, Impaired flexibility, Improper body mechanics, Postural dysfunction,  Decreased strength, Decreased mobility  Visit Diagnosis: Chronic right shoulder pain  Abnormal posture  Muscle weakness (generalized)  Cramp and spasm     Problem List Patient Active Problem List   Diagnosis Date  Noted  . Upper respiratory tract infection 02/14/2017  . Porokeratosis 09/07/2014  . Metatarsal deformity 09/07/2014  . Pain in lower limb 09/07/2014  . Squamous cell carcinoma in situ of skin 10/15/2013  . Hives 10/06/2012  . History of Guillain-Barre syndrome 03/07/2011  . Hypertension 11/28/2010  . Dyslipidemia 11/28/2010  . ERECTILE DYSFUNCTION 01/05/2008  . ASTHMA 10/28/2007  . GERD 10/28/2007  . DIZZINESS 10/28/2007  . COUGH 10/28/2007  . FASTING HYPERGLYCEMIA 10/28/2007  . PROSTATE CANCER, HX OF 10/28/2007   [name], PT 08/05/19 5:20 PM Phone: [phone] Fax: [phone] Alvera Singh 08/05/2019, 5:19 PM  [facility] [address], [place], [place], [address] Phone: [phone]   Fax:  [phone]  Name: Drew Smith MRN: [id number] Date of Birth: 09/03/1940

## 2019-08-05 NOTE — Telephone Encounter (Signed)
Refill OK

## 2019-08-05 NOTE — Telephone Encounter (Signed)
Last filled 02/10/2018 Last OV 05/11/2019  Ok to fill?

## 2019-08-05 NOTE — Patient Instructions (Signed)
Access Code: [email or web address] URL: [email or web address] Date: 08/05/2019 Prepared by: [name]  Exercises Cervical Retraction with Overpressure - 1 x daily - 7 x weekly - 10 reps - 2 sets - 10 hold Seated Scapular Retraction - 2 x daily - 7 x weekly - 5 reps - 2 sets - 5 hold Seated Upper Trapezius Stretch - 1 x daily - 7 x weekly - 3 reps - 1 sets - 30 hold Shoulder Flexion Overhead with Dowel - 1 x daily - 7 x weekly - 5-10 reps - 1 sets - 5 hold Doorway Pec Stretch at 90 Degrees Abduction - 2 x daily - 7 x weekly - 2 reps - 1 sets - 15 hold Shoulder Flexion Wall Slide with Towel - 1 x daily - 7 x weekly - 2 sets - 10 reps - 5 hold Push-Up on Counter - 1 x daily - 7 x weekly - 2 sets - 15 reps Supine Shoulder Horizontal Abduction with Resistance - 1 x daily - 7 x weekly - 10 reps - 3 sets Supine Shoulder External Rotation with Resistance - 1 x daily - 7 x weekly - 10 reps - 3 sets Standing Shoulder Diagonal Horizontal Abduction 60/120 Degrees with Resistance - 1 x daily - 7 x weekly - 3 sets - 10 reps Supine Shoulder Circles - 1 x daily - 7 x weekly - 2 sets - 10 reps Supine Scapular Protraction in Flexion with Dumbbells - 1 x daily - 7 x weekly - 2 sets - 15 reps Standing Shoulder Flexion to 90 Degrees with Dumbbells - 1 x daily - 7 x weekly - 10 reps - 3 sets Scaption with Dumbbells - 1 x daily - 7 x weekly - 10 reps - 3 sets Shoulder Abduction with Dumbbells - Thumbs Up - 1 x daily - 7 x weekly - 10 reps - 3 sets Seated Thoracic Lumbar Extension with Pectoralis Stretch - 1 x daily - 7 x weekly - 1 sets - 10 reps Shoulder extension with resistance - Neutral - 1 x daily - 7 x weekly - 1 sets - 10 reps

## 2019-08-11 DIAGNOSIS — M5136 Other intervertebral disc degeneration, lumbar region: Secondary | ICD-10-CM | POA: Diagnosis not present

## 2019-08-11 DIAGNOSIS — M50322 Other cervical disc degeneration at C5-C6 level: Secondary | ICD-10-CM | POA: Diagnosis not present

## 2019-08-11 DIAGNOSIS — M9901 Segmental and somatic dysfunction of cervical region: Secondary | ICD-10-CM | POA: Diagnosis not present

## 2019-08-11 DIAGNOSIS — M9903 Segmental and somatic dysfunction of lumbar region: Secondary | ICD-10-CM | POA: Diagnosis not present

## 2019-08-19 ENCOUNTER — Encounter: Payer: Self-pay | Admitting: Physical Therapy

## 2019-08-19 ENCOUNTER — Ambulatory Visit: Payer: Medicare Other | Admitting: Physical Therapy

## 2019-08-19 ENCOUNTER — Other Ambulatory Visit: Payer: Self-pay

## 2019-08-19 DIAGNOSIS — M25511 Pain in right shoulder: Secondary | ICD-10-CM | POA: Diagnosis not present

## 2019-08-19 DIAGNOSIS — R293 Abnormal posture: Secondary | ICD-10-CM | POA: Diagnosis not present

## 2019-08-19 DIAGNOSIS — M6281 Muscle weakness (generalized): Secondary | ICD-10-CM | POA: Diagnosis not present

## 2019-08-19 DIAGNOSIS — G8929 Other chronic pain: Secondary | ICD-10-CM | POA: Diagnosis not present

## 2019-08-19 DIAGNOSIS — R252 Cramp and spasm: Secondary | ICD-10-CM

## 2019-08-19 NOTE — Therapy (Signed)
[facility] [address] [place], [place], [address] Phone: [phone]   Fax:  [phone]  Physical Therapy Treatment  Patient Details  Name: Drew Smith MRN: [id number] Date of Birth: 07/02/1940 Referring Provider (PT): Gregor Hams, MD  Progress Note Reporting Period 04/30/19 to 08/19/19  See note below for Objective Data and Assessment of Progress/Goals.      Encounter Date: 08/19/2019   PT End of Session - 08/19/19 1705    Visit Number 10    Date for PT Re-Evaluation 11/11/19    Authorization Type [employer] Medicare    PT Start Time 1612    PT Stop Time 1658    PT Time Calculation (min) 46 min    Activity Tolerance Patient tolerated treatment well    Behavior During Therapy WFL for tasks assessed/performed           Past Medical History:  Diagnosis Date  . Arthritis   . Asthma   . Cancer [facility])    prostate  . GERD (gastroesophageal reflux disease)   . Guillain-Barre syndrome ([place]) 2000  . Hyperlipidemia   . Hypertension     Past Surgical History:  Procedure Laterality Date  . EYE SURGERY  2989   79 years old  . HERNIA REPAIR  1995  . NOSE SURGERY  1990  . PROSTATE SURGERY  2000   seed inplants  . SHOULDER SURGERY  2008    There were no vitals filed for this visit.   Subjective Assessment - 08/19/19 1616    Subjective I'm playing golf up to 4x/week.  Pain reaches 3-4/10 with golf.  Some pain with yardwork.    Limitations Lifting;House hold activities    Diagnostic tests MRI 04/20/19: AC joint severe DJD, old bicep tendon tear likely, partial tears and tendinopathy of RC likely    Patient Stated Goals golf, reduce pain    Currently in Pain? Yes    Pain Score 3     Pain Location Shoulder    Pain Orientation Right;Posterior;Lateral    Pain Descriptors / Indicators Aching    Pain Type Chronic pain    Pain Onset More than a month ago    Pain Frequency Intermittent    Aggravating Factors   golf, yardwork, overhead reaching    Pain Relieving Factors OTC meds, HEP    Effect of Pain on Daily Activities improving pain with golf and yardwork              [facility] PT Assessment - 08/19/19 0001      Assessment   Medical Diagnosis M25.511,G89.29 (ICD-10-CM) - Chronic right shoulder pain    Referring Provider (PT) Gregor Hams, MD    Onset Date/Surgical Date --   > 5 years    Hand Dominance Right    Next MD Visit --   30 days   Prior Therapy for Lt shoulder, had reconstructive surgery      Observation/Other Assessments   Focus on Therapeutic Outcomes (FOTO)  38%, goal 33%      Posture/Postural Control   Postural Limitations Rounded Shoulders;Forward head;Increased thoracic kyphosis    Posture Comments obligatory spinal position, only able to perform partial retraction and extension for improved posture, rigidity present      ROM / Strength   AROM / PROM / Strength Strength      AROM   Right Shoulder Flexion 125 Degrees    Right Shoulder ABduction 115 Degrees    Right Shoulder  External Rotation 55 Degrees      Strength   Overall Strength Comments Rt serratus anterior and ER 4/5 (ER weakness in abduction)      Palpation   Spinal mobility poor/fair thoracic extension throughout, poor cervical mobility throughout                         [facility] Adult PT Treatment/Exercise - 08/19/19 0001      Shoulder Exercises: Seated   Other Seated Exercises seated trunk rotation with cross body reach in tall sitting 3x10 sec each (added to HEP)    Other Seated Exercises seated trunk ext over small ball behind back with pec stretch hands behind head 15 reps x 3 sec holds      Shoulder Exercises: Standing   Protraction Limitations counter serratus push ups x 20 reps    External Rotation Strengthening;Right;Theraband;15 reps    Theraband Level (Shoulder External Rotation) Level 1 (Yellow)    External Rotation Limitations in 60 deg abd    Flexion AAROM;15 reps;Both     Flexion Limitations wall slides in doorway for flexion ROM/stretch    Other Standing Exercises standing trunk rotation with dowel held across upper t-spine (used long towel vs dowel for improved reach with bil UEs) x 1'                    PT Short Term Goals - 06/08/19 1945      PT SHORT TERM GOAL #1   Title Pt will be ind and compliant with initial HEP    Status Achieved      PT SHORT TERM GOAL #2   Title Pt will demo proper postural awareness for cervical and thoracic regions.    Baseline stiffness limits ideal posture but Pt aware of how to achieve his best posture within mobility limits    Status Achieved      PT SHORT TERM GOAL #3   Title Pt will improve scapular strenth to at least 4-/5    Status Achieved             PT Long Term Goals - 08/19/19 1618      PT LONG TERM GOAL #1   Title Pt will be ind in advanced HEP to target improved posture, shoulder stabilization, ROM and strength.    Baseline progressing strength and spinal flexibility/ROM    Time 12    Period Weeks    Status On-going    Target Date 11/11/19      PT LONG TERM GOAL #2   Title Pt will be able to play a round of golf with pain not to exceed 3/10.    Baseline pain with golf 3-4/10 up to 4 days a week    Time 12    Period Weeks    Status On-going    Target Date 11/11/19      PT LONG TERM GOAL #3   Title Pt will achieve Rt shoulder A/ROM for flexion to at least 125 for improved reaching tasks.    Baseline 125    Status Achieved      PT LONG TERM GOAL #4   Title Pt will reduce FOTO score to </= 33% to demo less limitation.    Baseline 38%, goal 33%    Time 12    Period Weeks    Status On-going    Target Date 11/11/19      PT LONG TERM GOAL #5   Title  Pt will demo at least 4+/5 for scapular stabilizers and Rt shoulder for improved tolerance of golf and other UE dependent activities.    Baseline signif winging Rt scapula with serratus strength 4/5, Rt ER and abd 4/5 with slight pain  on testing    Time 12    Status On-going    Target Date 11/11/19                 Plan - 08/19/19 1705    Clinical Impression Statement Pt reports good compliance with HEP and is playing golf 2-4 times a week.  Pain in Rt shoulder reaches up to 4/10 with golf.  He reports he continues to limit overhead use of Rt UE due to pain with overhead lifting or positioning for any prolonged period of time.  He has signif thoracic kyphosis and forward head and spinal mobility lacks compliance.  Pt is able to partially reduce kyphosis and forward head with cueing which has improved since starting PT.  Rt shoulder flexion is 125 with capsular end feel.  Bil UE elevation and abduction are also limited due to spinal stiffness.  Weakness is present in Rt serratus ant and ER in abduction.  PT fine tuned HEP to target strength in remaining weakness areas and added more spinal rotation stretching/ROM today.  Pt needed signif cueing while reviewing thoracic ext over ball in sitting from last visit.  Given updates made today to HEP and the need for cueing for technique for newer ther ex, PT recommends a taper to 1x/month for 2 more visits before dishcarge to ensure proper performance and full understanding of how to perform updated ther ex to optimize available mobility, strength, and posture.    Personal Factors and Comorbidities Age;Time since onset of injury/illness/exacerbation    Examination-Activity Limitations Reach Overhead;Carry;Lift    Examination-Participation Restrictions Community Activity    Stability/Clinical Decision Making Stable/Uncomplicated    Rehab Potential Good    PT Frequency Monthy    PT Duration 12 weeks   clinic schedule full in July, visits will be Aug and Sept   PT Treatment/Interventions ADLs/Self Care Home Management;Cryotherapy;Electrical Stimulation;Iontophoresis 4mg /ml Dexamethasone;Moist Heat;Traction;Functional mobility training;Therapeutic activities;Therapeutic  exercise;Neuromuscular re-education;Manual techniques;Patient/family education;Passive range of motion;Dry needling;Joint Manipulations;Spinal Manipulations;Taping    PT Next Visit Plan f/u on spinal mobility ther ex (seated ext over small ball, seated thoracic rotation and standing rotation with dowel (long towel), re-test serratus strength and ER in abd on Rt    PT Home Exercise Plan Access Code: [email or web address]    Consulted and Agree with Plan of Care Patient           Patient will benefit from skilled therapeutic intervention in order to improve the following deficits and impairments:  Decreased range of motion, Increased fascial restricitons, Increased muscle spasms, Impaired UE functional use, Pain, Hypomobility, Impaired flexibility, Improper body mechanics, Postural dysfunction, Decreased strength, Decreased mobility  Visit Diagnosis: Chronic right shoulder pain - Plan: PT plan of care cert/re-cert  Abnormal posture - Plan: PT plan of care cert/re-cert  Muscle weakness (generalized) - Plan: PT plan of care cert/re-cert  Cramp and spasm - Plan: PT plan of care cert/re-cert     Problem List Patient Active Problem List   Diagnosis Date Noted  . Upper respiratory tract infection 02/14/2017  . Porokeratosis 09/07/2014  . Metatarsal deformity 09/07/2014  . Pain in lower limb 09/07/2014  . Squamous cell carcinoma in situ of skin 10/15/2013  . Hives 10/06/2012  . History of Guillain-Barre syndrome 03/07/2011  .  Hypertension 11/28/2010  . Dyslipidemia 11/28/2010  . ERECTILE DYSFUNCTION 01/05/2008  . ASTHMA 10/28/2007  . GERD 10/28/2007  . DIZZINESS 10/28/2007  . COUGH 10/28/2007  . FASTING HYPERGLYCEMIA 10/28/2007  . PROSTATE CANCER, HX OF 10/28/2007    Baruch Merl, PT 08/19/19 5:18 PM   [facility] [address], [place], [place], [address] Phone: [phone]   Fax:  [phone]  Name: Drew Smith MRN: [id number] Date of Birth: 1940/05/11

## 2019-08-25 DIAGNOSIS — M50322 Other cervical disc degeneration at C5-C6 level: Secondary | ICD-10-CM | POA: Diagnosis not present

## 2019-08-25 DIAGNOSIS — M9903 Segmental and somatic dysfunction of lumbar region: Secondary | ICD-10-CM | POA: Diagnosis not present

## 2019-08-25 DIAGNOSIS — M9901 Segmental and somatic dysfunction of cervical region: Secondary | ICD-10-CM | POA: Diagnosis not present

## 2019-08-25 DIAGNOSIS — M5136 Other intervertebral disc degeneration, lumbar region: Secondary | ICD-10-CM | POA: Diagnosis not present

## 2019-08-26 ENCOUNTER — Other Ambulatory Visit: Payer: Self-pay | Admitting: Family Medicine

## 2019-09-14 DIAGNOSIS — L821 Other seborrheic keratosis: Secondary | ICD-10-CM | POA: Diagnosis not present

## 2019-09-14 DIAGNOSIS — L905 Scar conditions and fibrosis of skin: Secondary | ICD-10-CM | POA: Diagnosis not present

## 2019-09-14 DIAGNOSIS — D225 Melanocytic nevi of trunk: Secondary | ICD-10-CM | POA: Diagnosis not present

## 2019-09-14 DIAGNOSIS — L989 Disorder of the skin and subcutaneous tissue, unspecified: Secondary | ICD-10-CM | POA: Diagnosis not present

## 2019-09-14 DIAGNOSIS — D0362 Melanoma in situ of left upper limb, including shoulder: Secondary | ICD-10-CM | POA: Diagnosis not present

## 2019-09-14 DIAGNOSIS — L578 Other skin changes due to chronic exposure to nonionizing radiation: Secondary | ICD-10-CM | POA: Diagnosis not present

## 2019-09-15 DIAGNOSIS — M9903 Segmental and somatic dysfunction of lumbar region: Secondary | ICD-10-CM | POA: Diagnosis not present

## 2019-09-15 DIAGNOSIS — M50322 Other cervical disc degeneration at C5-C6 level: Secondary | ICD-10-CM | POA: Diagnosis not present

## 2019-09-15 DIAGNOSIS — M9901 Segmental and somatic dysfunction of cervical region: Secondary | ICD-10-CM | POA: Diagnosis not present

## 2019-09-15 DIAGNOSIS — M5136 Other intervertebral disc degeneration, lumbar region: Secondary | ICD-10-CM | POA: Diagnosis not present

## 2019-09-28 DIAGNOSIS — L570 Actinic keratosis: Secondary | ICD-10-CM | POA: Diagnosis not present

## 2019-09-29 DIAGNOSIS — M9905 Segmental and somatic dysfunction of pelvic region: Secondary | ICD-10-CM | POA: Diagnosis not present

## 2019-09-29 DIAGNOSIS — M9904 Segmental and somatic dysfunction of sacral region: Secondary | ICD-10-CM | POA: Diagnosis not present

## 2019-09-29 DIAGNOSIS — M5136 Other intervertebral disc degeneration, lumbar region: Secondary | ICD-10-CM | POA: Diagnosis not present

## 2019-09-29 DIAGNOSIS — M9903 Segmental and somatic dysfunction of lumbar region: Secondary | ICD-10-CM | POA: Diagnosis not present

## 2019-10-05 ENCOUNTER — Ambulatory Visit: Payer: Medicare Other | Admitting: Physical Therapy

## 2019-10-18 ENCOUNTER — Other Ambulatory Visit: Payer: Self-pay

## 2019-10-18 DIAGNOSIS — L905 Scar conditions and fibrosis of skin: Secondary | ICD-10-CM | POA: Diagnosis not present

## 2019-10-18 DIAGNOSIS — D0362 Melanoma in situ of left upper limb, including shoulder: Secondary | ICD-10-CM | POA: Diagnosis not present

## 2019-10-18 DIAGNOSIS — R238 Other skin changes: Secondary | ICD-10-CM | POA: Diagnosis not present

## 2019-10-18 DIAGNOSIS — L821 Other seborrheic keratosis: Secondary | ICD-10-CM | POA: Diagnosis not present

## 2019-10-19 ENCOUNTER — Encounter: Payer: Self-pay | Admitting: Family Medicine

## 2019-10-19 ENCOUNTER — Ambulatory Visit (INDEPENDENT_AMBULATORY_CARE_PROVIDER_SITE_OTHER): Payer: Medicare Other | Admitting: Family Medicine

## 2019-10-19 VITALS — BP 122/60 | HR 72 | Temp 98.7°F | Wt 163.4 lb

## 2019-10-19 DIAGNOSIS — R0789 Other chest pain: Secondary | ICD-10-CM | POA: Diagnosis not present

## 2019-10-19 DIAGNOSIS — L821 Other seborrheic keratosis: Secondary | ICD-10-CM | POA: Diagnosis not present

## 2019-10-19 DIAGNOSIS — I10 Essential (primary) hypertension: Secondary | ICD-10-CM

## 2019-10-19 DIAGNOSIS — C4362 Malignant melanoma of left upper limb, including shoulder: Secondary | ICD-10-CM | POA: Diagnosis not present

## 2019-10-19 NOTE — Progress Notes (Signed)
Established Patient Office Visit  Subjective:  Patient ID: Drew Smith, male    DOB: Aug 05, 1940  Age: 79 y.o. MRN: [id number]  CC:  Chief Complaint  Patient presents with  . Hypertension    pt wants to discuss bp he states it was high yesterday and had chest pain    HPI Drew Smith presents for the following items  He sees a dermatologist and was diagnosed apparently with melanoma left forearm recently.  He had surgery yesterday for excision.  He has bandage on today and has had some swelling of the left hand.  Not elevating.  Yesterday when he went in for his outpatient surgery had elevated blood pressure with systolic 563 range but this is back down to normal today with reading here 122/60.  Recently has had a couple fleeting episodes of sharp chest pain left and mid chest.  These usually last 30 to 60 seconds.  No dyspnea.  He played golf several times last week with lots of walking around the heat with no pain whatsoever.  No recent dyspnea.  No left arm symptoms.  No nausea or vomiting.  No cough.  No fever.  No pleuritic pain.  No GERD symptoms.  Finally, he has questions regarding Covid vaccine.  He was concerned because of prior history of Guillain- Barr.  Also, he relates he was down to [facility] last year with several folks and some had Covid and a couple even died.  He had symptoms suggestive of possible Covid last October was never tested.  He thinks he may have fully recovered.  Past Medical History:  Diagnosis Date  . Arthritis   . Asthma   . Cancer [facility])    prostate  . GERD (gastroesophageal reflux disease)   . Guillain-Barre syndrome ([place]) 2000  . Hyperlipidemia   . Hypertension     Past Surgical History:  Procedure Laterality Date  . EYE SURGERY  7352   79 years old  . HERNIA REPAIR  1995  . NOSE SURGERY  1990  . PROSTATE SURGERY  2000   seed inplants  . SHOULDER SURGERY  2008    Family History  Problem Relation Age of Onset  .  Hyperlipidemia Brother   . Stroke Brother   . Diabetes Brother        type ll  . Alcohol abuse Father     Social History   Socioeconomic History  . Marital status: Divorced    Spouse name: Not on file  . Number of children: 3  . Years of education: Not on file  . Highest education level: Some college, no degree  Occupational History  . Occupation: retired  Tobacco Use  . Smoking status: Never Smoker  . Smokeless tobacco: Never Used  Vaping Use  . Vaping Use: Never used  Substance and Sexual Activity  . Alcohol use: Yes    Alcohol/week: 7.0 standard drinks    Types: 7 Glasses of wine per week    Comment: have 1/2 a day; wine or other   . Drug use: No  . Sexual activity: Not Currently  Other Topics Concern  . Not on file  Social History Narrative   Lives alone in one level. Has two sons 1 daughter and friends who serve as support.   Former Chiropodist; Enjoys Data processing manager, reading   Originally from [place]   Social Determinants of Health   Financial Resource Strain: Cruger   . Difficulty of Paying Living Expenses:  Not hard at all  Food Insecurity: No Food Insecurity  . Worried About Charity fundraiser in the Last Year: Never true  . Ran Out of Food in the Last Year: Never true  Transportation Needs: No Transportation Needs  . Lack of Transportation (Medical): No  . Lack of Transportation (Non-Medical): No  Physical Activity: Sufficiently Active  . Days of Exercise per Week: 7 days  . Minutes of Exercise per Session: 30 min  Stress: Stress Concern Present  . Feeling of Stress : Rather much  Social Connections: Unknown  . Frequency of Communication with Friends and Family: More than three times a week  . Frequency of Social Gatherings with Friends and Family: Once a week  . Attends Religious Services: Not on file  . Active Member of Clubs or Organizations: No  . Attends Archivist Meetings: Never  . Marital Status: Divorced  Human resources officer  Violence:   . Fear of Current or Ex-Partner: Not on file  . Emotionally Abused: Not on file  . Physically Abused: Not on file  . Sexually Abused: Not on file    Outpatient Medications Prior to Visit  Medication Sig Dispense Refill  . acyclovir (ZOVIRAX) 400 MG tablet TAKE 1 TABLET (400 MG TOTAL) BY MOUTH AS NEEDED. 90 tablet 2  . amLODipine (NORVASC) 5 MG tablet Take 1 tablet (5 mg total) by mouth daily. 90 tablet 3  . azelastine (ASTELIN) 0.1 % nasal spray Place 1 spray into both nostrils 2 (two) times daily. Use in each nostril as directed (Patient taking differently: Place 1 spray into both nostrils daily as needed. Use in each nostril as directed) 30 mL 12  . ciclopirox (LOPROX) 0.77 % cream APPLY TO AFFECTED AREA(S) TOPICALLY TWO TIMES A DAY AS DIRECTED 15 g 0  . diphenhydrAMINE-zinc acetate (BENADRYL) cream Apply 1 application topically daily as needed for itching.    . fluorouracil (EFUDEX) 5 % cream Apply topically as needed. Per Dr Joaquim Lai    . hydrochlorothiazide (MICROZIDE) 12.5 MG capsule Take 1 capsule (12.5 mg total) by mouth daily. 90 capsule 3  . ibuprofen (ADVIL,MOTRIN) 600 MG tablet Only use 800 mg  8 hours sparingly. (Patient taking differently: Take 600 mg by mouth every 6 (six) hours as needed. Only use 800 mg  8 hours sparingly.) 60 tablet 0  . levocetirizine (XYZAL) 5 MG tablet TAKE ONE TABLET BY MOUTH EVERY EVENING 90 tablet 0  . omeprazole (PRILOSEC) 20 MG capsule TAKE ONE CAPSULE BY MOUTH TWICE A DAY 180 capsule 0  . Oxymetazoline HCl (NASAL SPRAY) 0.05 % SOLN Place into the nose.    . sildenafil (REVATIO) 20 MG tablet TAKE 2 TO 5 TABLETS BY MOUTH 1 HOUR PRIOR TO SEXUAL ACTIVITY 50 tablet 0  . triamcinolone cream (KENALOG) 0.1 % APPLY TO AFFECTED AREA(S) TWO TIMES A DAY AS DIRECTED 30 g 0  . VENTOLIN HFA 108 (90 Base) MCG/ACT inhaler INHALE TWO PUFFS BY MOUTH EVERY 6 HOURS AS NEEDED FOR WHEEZING OR SHORTNESS OF BREATH 18 each 0   No facility-administered medications  prior to visit.    No Known Allergies  ROS Review of Systems  Constitutional: Negative for appetite change, chills, fever and unexpected weight change.  Respiratory: Negative for cough and shortness of breath.   Cardiovascular: Positive for chest pain. Negative for palpitations and leg swelling.  Gastrointestinal: Negative for abdominal pain.      Objective:    Physical Exam Vitals reviewed.  Constitutional:  Appearance: Normal appearance.  Cardiovascular:     Rate and Rhythm: Normal rate and regular rhythm.  Pulmonary:     Effort: Pulmonary effort is normal.     Breath sounds: Normal breath sounds.  Skin:    Comments: He has some scattered brown well-demarcated symmetric slightly raised lesions including right lower leg, left posterior leg and a small one on his right cheek region.  These do not have any atypical features.  Neurological:     Mental Status: He is alert.     BP 122/60 (BP Location: Left Arm, Patient Position: Sitting, Cuff Size: Normal)   Pulse 72   Temp 98.7 F (37.1 C) (Oral)   Wt 163 lb 6.4 oz (74.1 kg)   SpO2 97%   BMI 26.17 kg/m  Wt Readings from Last 3 Encounters:  10/19/19 163 lb 6.4 oz (74.1 kg)  05/11/19 167 lb 6.4 oz (75.9 kg)  04/20/19 166 lb 3.2 oz (75.4 kg)     Health Maintenance Due  Topic Date Due  . COVID-19 Vaccine (1) Never done    There are no preventive care reminders to display for this patient.  Lab Results  Component Value Date   TSH 2.96 04/05/2019   Lab Results  Component Value Date   WBC 6.3 04/05/2019   HGB 14.9 04/05/2019   HCT 43.3 04/05/2019   MCV 93.7 04/05/2019   PLT 233.0 04/05/2019   Lab Results  Component Value Date   NA 138 04/05/2019   K 4.0 04/05/2019   CO2 29 04/05/2019   GLUCOSE 107 (H) 04/05/2019   BUN 18 04/05/2019   CREATININE 0.99 04/05/2019   BILITOT 0.5 04/05/2019   ALKPHOS 58 04/05/2019   AST 14 04/05/2019   ALT 12 04/05/2019   PROT 6.7 04/05/2019   ALBUMIN 4.0 04/05/2019     CALCIUM 9.0 04/05/2019   GFR 72.81 04/05/2019   Lab Results  Component Value Date   CHOL 163 04/05/2019   Lab Results  Component Value Date   HDL 47.60 04/05/2019   Lab Results  Component Value Date   LDLCALC 95 04/05/2019   Lab Results  Component Value Date   TRIG 103.0 04/05/2019   Lab Results  Component Value Date   CHOLHDL 3 04/05/2019   Lab Results  Component Value Date   HGBA1C 5.4 12/08/2015      Assessment & Plan:   #1 recent diagnosis of malignant melanoma left forearm.  Followed by dermatology and skin surgery center with surgery yesterday.  #2  Atypical chest pain.  Atypical features including at rest, sharp quality, and 20-30 second duration.  No recent concerning symptoms otherwise  -Check EKG. this shows normal sinus rhythm.  He does have Q waves in lateral leads but these were present back in 2009.  No acute ST-T changes.  #3 recent transient elevated blood pressure prior to his procedure yesterday.  This is back down today  #4 benign appearing seborrheic keratoses including right cheek as well as right and left legs.  No orders of the defined types were placed in this encounter.   Follow-up: No follow-ups on file.    [name], MD

## 2019-10-19 NOTE — Patient Instructions (Signed)

## 2019-11-02 ENCOUNTER — Encounter: Payer: Medicare Other | Admitting: Physical Therapy

## 2019-11-09 ENCOUNTER — Encounter: Payer: Self-pay | Admitting: Physical Therapy

## 2019-11-09 ENCOUNTER — Ambulatory Visit: Payer: Medicare Other | Attending: Family Medicine | Admitting: Physical Therapy

## 2019-11-09 ENCOUNTER — Other Ambulatory Visit: Payer: Self-pay

## 2019-11-09 DIAGNOSIS — M25511 Pain in right shoulder: Secondary | ICD-10-CM | POA: Diagnosis not present

## 2019-11-09 DIAGNOSIS — R293 Abnormal posture: Secondary | ICD-10-CM | POA: Insufficient documentation

## 2019-11-09 DIAGNOSIS — G8929 Other chronic pain: Secondary | ICD-10-CM | POA: Diagnosis not present

## 2019-11-09 DIAGNOSIS — M6281 Muscle weakness (generalized): Secondary | ICD-10-CM | POA: Diagnosis not present

## 2019-11-09 NOTE — Therapy (Addendum)
[facility] [address], [place], [place], [address] Phone: [phone]   Fax:  [phone]  Physical Therapy Treatment  Patient Details  Name: Drew Smith MRN: [id number] Date of Birth: Mar 08, 1940 Referring Provider (PT): Gregor Hams, MD   Encounter Date: 11/09/2019   PT End of Session - 11/09/19 1543    Visit Number 11    Date for PT Re-Evaluation 11/11/19    Authorization Type UHC Medicare    PT Start Time 2446    PT Stop Time 2863   short session, d/c to HEP   PT Time Calculation (min) 30 min    Activity Tolerance Patient tolerated treatment well    Behavior During Therapy [facility] for tasks assessed/performed           Past Medical History:  Diagnosis Date  . Arthritis   . Asthma   . Cancer [facility])    prostate  . GERD (gastroesophageal reflux disease)   . Guillain-Barre syndrome ([place]) 2000  . Hyperlipidemia   . Hypertension     Past Surgical History:  Procedure Laterality Date  . EYE SURGERY  8066   79 years old  . HERNIA REPAIR  1995  . NOSE SURGERY  1990  . PROSTATE SURGERY  2000   seed inplants  . SHOULDER SURGERY  2008    There were no vitals filed for this visit.   Subjective Assessment - 11/09/19 1540    Subjective Playing golf 2-3x/week without shoulder pain.  Doing HEP.  New pain in Lt thumb following some skin cancer removal to Lt forearm 1 month ago.    Limitations Lifting;House hold activities    Diagnostic tests MRI 04/20/19: AC joint severe DJD, old bicep tendon tear likely, partial tears and tendinopathy of RC likely    Patient Stated Goals golf, reduce pain    Currently in Pain? 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                             [place] Adult PT Treatment/Exercise - 11/09/19 0001      Shoulder Exercises: Seated   Other Seated Exercises seated trunk rotation with cross  body reach in tall sitting 3x10 sec each (added to HEP)      Shoulder Exercises: Standing   Horizontal ABduction Strengthening;Theraband;20 reps    Theraband Level (Shoulder Horizontal ABduction) Level 2 (Red)    External Rotation Strengthening;Both;15 reps;Theraband    Theraband Level (Shoulder External Rotation) Level 2 (Red)    Flexion Weights;Both;10 reps    Shoulder Flexion Weight (lbs) 3    ABduction Strengthening;Weights;Both;10 reps    Shoulder ABduction Weight (lbs) 3    Extension Strengthening;Both;Theraband;10 reps    Theraband Level (Shoulder Extension) Level 3 (Green)    Corporate treasurer;Theraband;10 reps    Theraband Level (Shoulder Row) Level 3 (Green)    Diagonals Strengthening;10 reps;Theraband    Theraband Level (Shoulder Diagonals) Level 2 (Red)      Shoulder Exercises: Stretch   Wall Stretch - Flexion Limitations doorframe wall slides bil x 15    Other Shoulder Stretches thoracic ext seated with hands behind head wide elbow reach, extend over small ball at mid-thoracic spine    Other Shoulder Stretches standing thoracic ext with dowel on shoulders x 20  PT Short Term Goals - 06/08/19 1945      PT SHORT TERM GOAL #1   Title Pt will be ind and compliant with initial HEP    Status Achieved      PT SHORT TERM GOAL #2   Title Pt will demo proper postural awareness for cervical and thoracic regions.    Baseline stiffness limits ideal posture but Pt aware of how to achieve his best posture within mobility limits    Status Achieved      PT SHORT TERM GOAL #3   Title Pt will improve scapular strenth to at least 4-/5    Status Achieved             PT Long Term Goals - 11/09/19 1609      PT LONG TERM GOAL #1   Title Pt will be ind in advanced HEP to target improved posture, shoulder stabilization, ROM and strength.    Status Achieved      PT LONG TERM GOAL #2   Title Pt will be able to play a round of golf with pain not to  exceed 3/10.    Status Achieved      PT LONG TERM GOAL #3   Title Pt will achieve Rt shoulder A/ROM for flexion to at least 125 for improved reaching tasks.    Status Achieved      PT LONG TERM GOAL #4   Title Pt will reduce FOTO score to </= 33% to demo less limitation.    Status Achieved      PT LONG TERM GOAL #5   Title Pt will demo at least 4+/5 for scapular stabilizers and Rt shoulder for improved tolerance of golf and other UE dependent activities.    Status Achieved                 Plan - 11/09/19 1606    Clinical Impression Statement Pt has met all LTGs.  He is playing golf 2-3x/week without Rt shoulder pain.  Pt with good compliance with HEP which has yielded improvement in Rt shoulder strength measuring at least 4+/5 in all muscle groups with exception of serratus anterior (4/5).  He demonstrates improving upright posture and neck retraction compared to previous visits without need for cueing.  He demonstrated all of his ther ex with good form with PT cueing for only a need to slow down to demo more control.  PT encouraged less reps with more control/slower concentric and eccentric phases vs more reps performed ballistically.  Pt is ready to d/c to HEP.    Personal Factors and Comorbidities Age;Time since onset of injury/illness/exacerbation    Rehab Potential Good    PT Frequency Monthy    PT Duration 12 weeks    PT Treatment/Interventions ADLs/Self Care Home Management;Cryotherapy;Electrical Stimulation;Iontophoresis 39m/ml Dexamethasone;Moist Heat;Traction;Functional mobility training;Therapeutic activities;Therapeutic exercise;Neuromuscular re-education;Manual techniques;Patient/family education;Passive range of motion;Dry needling;Joint Manipulations;Spinal Manipulations;Taping    PT Next Visit Plan d/c to HEP    PT Home Exercise Plan Access Code: [email or web address]    Consulted and Agree with Plan of Care Patient           Patient will benefit from skilled therapeutic  intervention in order to improve the following deficits and impairments:     Visit Diagnosis: Chronic right shoulder pain  Abnormal posture  Muscle weakness (generalized)     Problem List Patient Active Problem List   Diagnosis Date Noted  . [place] 10/19/2019  . Upper respiratory  tract infection 02/14/2017  . Porokeratosis 09/07/2014  . Metatarsal deformity 09/07/2014  . Pain in lower limb 09/07/2014  . Squamous cell carcinoma in situ of skin 10/15/2013  . Hives 10/06/2012  . History of Guillain-Barre syndrome 03/07/2011  . Hypertension 11/28/2010  . Dyslipidemia 11/28/2010  . ERECTILE DYSFUNCTION 01/05/2008  . ASTHMA 10/28/2007  . GERD 10/28/2007  . DIZZINESS 10/28/2007  . COUGH 10/28/2007  . FASTING HYPERGLYCEMIA 10/28/2007  . PROSTATE CANCER, HX OF 10/28/2007    PHYSICAL THERAPY DISCHARGE SUMMARY  Visits from Start of Care: 11  Current functional level related to goals / functional outcomes: See above   Remaining deficits: See above   Education / Equipment: HEP Plan: Patient agrees to discharge.  Patient goals were met. Patient is being discharged due to meeting the stated rehab goals.  ?????         [employer], PT 11/09/19 4:11 PM  PHYSICAL THERAPY DISCHARGE SUMMARY  Visits from Start of Care: 11  Current functional level related to goals / functional outcomes: See above   Remaining deficits: See above   Education / Equipment: HEP Plan: Patient agrees to discharge.  Patient goals were met. Patient is being discharged due to being pleased with the current functional level.  ?????         Baruch Merl, PT 02/01/20 10:05 AM   [facility] [address] [place], [place], [address] Phone: [phone]   Fax:  [phone]  Name: AIRAM HEIDECKER MRN: [id number] Date of Birth: 03/25/1940

## 2019-12-07 ENCOUNTER — Ambulatory Visit: Payer: Medicare Other | Admitting: Physical Therapy

## 2019-12-15 ENCOUNTER — Other Ambulatory Visit: Payer: Self-pay | Admitting: Family Medicine

## 2019-12-22 DIAGNOSIS — M9904 Segmental and somatic dysfunction of sacral region: Secondary | ICD-10-CM | POA: Diagnosis not present

## 2019-12-22 DIAGNOSIS — M5136 Other intervertebral disc degeneration, lumbar region: Secondary | ICD-10-CM | POA: Diagnosis not present

## 2019-12-22 DIAGNOSIS — M9903 Segmental and somatic dysfunction of lumbar region: Secondary | ICD-10-CM | POA: Diagnosis not present

## 2019-12-22 DIAGNOSIS — M9905 Segmental and somatic dysfunction of pelvic region: Secondary | ICD-10-CM | POA: Diagnosis not present

## 2020-01-05 ENCOUNTER — Other Ambulatory Visit: Payer: Self-pay | Admitting: Family Medicine

## 2020-01-11 DIAGNOSIS — M9903 Segmental and somatic dysfunction of lumbar region: Secondary | ICD-10-CM | POA: Diagnosis not present

## 2020-01-11 DIAGNOSIS — M5136 Other intervertebral disc degeneration, lumbar region: Secondary | ICD-10-CM | POA: Diagnosis not present

## 2020-01-11 DIAGNOSIS — M9904 Segmental and somatic dysfunction of sacral region: Secondary | ICD-10-CM | POA: Diagnosis not present

## 2020-01-11 DIAGNOSIS — M9905 Segmental and somatic dysfunction of pelvic region: Secondary | ICD-10-CM | POA: Diagnosis not present

## 2020-01-13 DIAGNOSIS — M9905 Segmental and somatic dysfunction of pelvic region: Secondary | ICD-10-CM | POA: Diagnosis not present

## 2020-01-13 DIAGNOSIS — M5136 Other intervertebral disc degeneration, lumbar region: Secondary | ICD-10-CM | POA: Diagnosis not present

## 2020-01-13 DIAGNOSIS — M9904 Segmental and somatic dysfunction of sacral region: Secondary | ICD-10-CM | POA: Diagnosis not present

## 2020-01-13 DIAGNOSIS — M9903 Segmental and somatic dysfunction of lumbar region: Secondary | ICD-10-CM | POA: Diagnosis not present

## 2020-01-24 DIAGNOSIS — M9904 Segmental and somatic dysfunction of sacral region: Secondary | ICD-10-CM | POA: Diagnosis not present

## 2020-01-24 DIAGNOSIS — M9905 Segmental and somatic dysfunction of pelvic region: Secondary | ICD-10-CM | POA: Diagnosis not present

## 2020-01-24 DIAGNOSIS — M9903 Segmental and somatic dysfunction of lumbar region: Secondary | ICD-10-CM | POA: Diagnosis not present

## 2020-01-24 DIAGNOSIS — M5136 Other intervertebral disc degeneration, lumbar region: Secondary | ICD-10-CM | POA: Diagnosis not present

## 2020-01-25 DIAGNOSIS — L821 Other seborrheic keratosis: Secondary | ICD-10-CM | POA: Diagnosis not present

## 2020-01-25 DIAGNOSIS — L570 Actinic keratosis: Secondary | ICD-10-CM | POA: Diagnosis not present

## 2020-01-25 DIAGNOSIS — L308 Other specified dermatitis: Secondary | ICD-10-CM | POA: Diagnosis not present

## 2020-01-25 DIAGNOSIS — L905 Scar conditions and fibrosis of skin: Secondary | ICD-10-CM | POA: Diagnosis not present

## 2020-01-25 DIAGNOSIS — Z85820 Personal history of malignant melanoma of skin: Secondary | ICD-10-CM | POA: Diagnosis not present

## 2020-01-31 ENCOUNTER — Telehealth: Payer: Self-pay | Admitting: Family Medicine

## 2020-01-31 DIAGNOSIS — M9904 Segmental and somatic dysfunction of sacral region: Secondary | ICD-10-CM | POA: Diagnosis not present

## 2020-01-31 DIAGNOSIS — M9903 Segmental and somatic dysfunction of lumbar region: Secondary | ICD-10-CM | POA: Diagnosis not present

## 2020-01-31 DIAGNOSIS — M9905 Segmental and somatic dysfunction of pelvic region: Secondary | ICD-10-CM | POA: Diagnosis not present

## 2020-01-31 DIAGNOSIS — M5136 Other intervertebral disc degeneration, lumbar region: Secondary | ICD-10-CM | POA: Diagnosis not present

## 2020-01-31 NOTE — Progress Notes (Signed)
  Chronic Care Management   Outreach Note  01/31/2020 Name: MELBERT BOTELHO MRN: [id number] DOB: [birth year]  Referred by: [name], MD Reason for referral : No chief complaint on file.   An unsuccessful telephone outreach was attempted today. The patient was referred to the pharmacist for assistance with care management and care coordination.   Follow Up Plan:   Carley Perdue [employer] Scheduler

## 2020-02-03 ENCOUNTER — Telehealth: Payer: Self-pay | Admitting: Family Medicine

## 2020-02-03 NOTE — Progress Notes (Signed)
  Chronic Care Management   Note  02/03/2020 Name: JAMESYN LINDELL MRN: [id number] DOB: [birth year]  [name] is a 79 y.o. year old male who is a primary care patient of Burchette, Alinda Sierras, MD. I reached out to [name] by phone today in response to a referral sent by Mr. Geraldine Contras PCP, [name], MD.   Mr. Gilkison was given information about Chronic Care Management services today including:  1. CCM service includes personalized support from designated clinical staff supervised by his physician, including individualized plan of care and coordination with other care providers 2. 24/7 contact phone numbers for assistance for urgent and routine care needs. 3. Service will only be billed when office clinical staff spend 20 minutes or more in a month to coordinate care. 4. Only one practitioner may furnish and bill the service in a calendar month. 5. The patient may stop CCM services at any time (effective at the end of the month) by phone call to the office staff.   Patient agreed to services and verbal consent obtained.   Follow up plan:   Carley Perdue [employer] Scheduler

## 2020-02-07 DIAGNOSIS — M9904 Segmental and somatic dysfunction of sacral region: Secondary | ICD-10-CM | POA: Diagnosis not present

## 2020-02-07 DIAGNOSIS — M9905 Segmental and somatic dysfunction of pelvic region: Secondary | ICD-10-CM | POA: Diagnosis not present

## 2020-02-07 DIAGNOSIS — M9903 Segmental and somatic dysfunction of lumbar region: Secondary | ICD-10-CM | POA: Diagnosis not present

## 2020-02-07 DIAGNOSIS — M5136 Other intervertebral disc degeneration, lumbar region: Secondary | ICD-10-CM | POA: Diagnosis not present

## 2020-02-08 ENCOUNTER — Other Ambulatory Visit: Payer: Self-pay

## 2020-02-09 ENCOUNTER — Ambulatory Visit (INDEPENDENT_AMBULATORY_CARE_PROVIDER_SITE_OTHER): Payer: Medicare Other | Admitting: Family Medicine

## 2020-02-09 ENCOUNTER — Encounter: Payer: Self-pay | Admitting: Family Medicine

## 2020-02-09 ENCOUNTER — Ambulatory Visit (INDEPENDENT_AMBULATORY_CARE_PROVIDER_SITE_OTHER): Payer: Medicare Other

## 2020-02-09 VITALS — BP 150/80 | HR 71 | Temp 97.9°F | Ht 66.25 in | Wt 168.9 lb

## 2020-02-09 DIAGNOSIS — M25552 Pain in left hip: Secondary | ICD-10-CM | POA: Diagnosis not present

## 2020-02-09 DIAGNOSIS — M25852 Other specified joint disorders, left hip: Secondary | ICD-10-CM | POA: Diagnosis not present

## 2020-02-09 DIAGNOSIS — Z8546 Personal history of malignant neoplasm of prostate: Secondary | ICD-10-CM

## 2020-02-09 LAB — CBC WITH DIFFERENTIAL/PLATELET
Basophils Absolute: 0 10*3/uL (ref 0.0–0.1)
Basophils Relative: 0.2 % (ref 0.0–3.0)
Eosinophils Absolute: 0.5 10*3/uL (ref 0.0–0.7)
Eosinophils Relative: 6.4 % — ABNORMAL HIGH (ref 0.0–5.0)
HCT: 40.4 % (ref 39.0–52.0)
Hemoglobin: 13.8 g/dL (ref 13.0–17.0)
Lymphocytes Relative: 16 % (ref 12.0–46.0)
Lymphs Abs: 1.3 10*3/uL (ref 0.7–4.0)
MCHC: 34.2 g/dL (ref 30.0–36.0)
MCV: 93 fl (ref 78.0–100.0)
Monocytes Absolute: 0.7 10*3/uL (ref 0.1–1.0)
Monocytes Relative: 9.5 % (ref 3.0–12.0)
Neutro Abs: 5.3 10*3/uL (ref 1.4–7.7)
Neutrophils Relative %: 67.9 % (ref 43.0–77.0)
Platelets: 231 10*3/uL (ref 150.0–400.0)
RBC: 4.34 Mil/uL (ref 4.22–5.81)
RDW: 12.8 % (ref 11.5–15.5)
WBC: 7.9 10*3/uL (ref 4.0–10.5)

## 2020-02-09 LAB — COMPREHENSIVE METABOLIC PANEL
ALT: 11 U/L (ref 0–53)
AST: 12 U/L (ref 0–37)
Albumin: 3.8 g/dL (ref 3.5–5.2)
Alkaline Phosphatase: 70 U/L (ref 39–117)
BUN: 15 mg/dL (ref 6–23)
CO2: 28 mEq/L (ref 19–32)
Calcium: 9 mg/dL (ref 8.4–10.5)
Chloride: 101 mEq/L (ref 96–112)
Creatinine, Ser: 0.89 mg/dL (ref 0.40–1.50)
GFR: 80.97 mL/min (ref >60.00)
Glucose, Bld: 146 mg/dL — ABNORMAL HIGH (ref 70–99)
Potassium: 4.1 mEq/L (ref 3.5–5.1)
Sodium: 136 mEq/L (ref 135–145)
Total Bilirubin: 0.6 mg/dL (ref 0.2–1.2)
Total Protein: 6.5 g/dL (ref 6.0–8.3)

## 2020-02-09 LAB — SEDIMENTATION RATE: Sed Rate: 30 mm/hr — ABNORMAL HIGH (ref 0–20)

## 2020-02-09 LAB — PSA: PSA: 0.01 ng/mL — ABNORMAL LOW (ref 0.10–4.00)

## 2020-02-09 MED ORDER — TRAMADOL HCL 50 MG PO TABS: 50.0000 mg | ORAL_TABLET | Freq: Four times a day (QID) | ORAL | 0 refills | Status: AC | PRN

## 2020-02-09 MED ORDER — METHYLPREDNISOLONE ACETATE 80 MG/ML IJ SUSP
80.0000 mg | Freq: Once | INTRAMUSCULAR | Status: AC
  Administered 2020-02-09: 80 mg via INTRAMUSCULAR

## 2020-02-09 NOTE — Patient Instructions (Signed)

## 2020-02-09 NOTE — Progress Notes (Signed)
Established Patient Office Visit  Subjective:  Patient ID: Drew Smith, male    DOB: May 30, 1940  Age: 79 y.o. MRN: [id number]  CC:  Chief Complaint  Patient presents with  . Back Pain    Patient complains of left buttock pain intermittently x2 months, worse past 2 days, no known injury    HPI Drew Smith presents for several month history of progressive pain in his lower back and left hip area.  Denies any specific injury.  Generally golfs several times per week but has been limited recently.  His pain has particularly been bad the past 3 to 4 days.  He describes a pain which is really almost more left sacroiliac and lower back region but radiates toward the buttock and down the left thigh posteriorly at times.  No weakness.  He has occasional bilateral foot numbness but not consistently.  No urine or stool incontinence.  Pain interfering with sleep and moderate to severe at times.    Went to chiropractor Monday and had some type of adjustment without improvement.  He also is complaining increased stiffness of the left hip and back region.  He is worried about PSA.  He has had remote history of prostate cancer.  Last PSA was 2018 and relatively normal.  He denies any appetite or weight changes.  No fever.  No dysuria.  His other chronic problems include history of hypertension, GERD, history of squamous cell carcinoma the skin, dyslipidemia, history of Guillain-Barr syndrome, history of melanoma  Past Medical History:  Diagnosis Date  . Arthritis   . Asthma   . Cancer [facility])    prostate  . GERD (gastroesophageal reflux disease)   . Guillain-Barre syndrome ([place]) 2000  . Hyperlipidemia   . Hypertension     Past Surgical History:  Procedure Laterality Date  . EYE SURGERY  1943   79 years old  . HERNIA REPAIR  1995  . NOSE SURGERY  1990  . PROSTATE SURGERY  2000   seed inplants  . SHOULDER SURGERY  2008    Family History  Problem Relation Age of Onset  .  Hyperlipidemia Brother   . Stroke Brother   . Diabetes Brother        type ll  . Alcohol abuse Father     Social History   Socioeconomic History  . Marital status: Divorced    Spouse name: Not on file  . Number of children: 3  . Years of education: Not on file  . Highest education level: Some college, no degree  Occupational History  . Occupation: retired  Tobacco Use  . Smoking status: Never Smoker  . Smokeless tobacco: Never Used  Vaping Use  . Vaping Use: Never used  Substance and Sexual Activity  . Alcohol use: Yes    Alcohol/week: 7.0 standard drinks    Types: 7 Glasses of wine per week    Comment: have 1/2 a day; wine or other   . Drug use: No  . Sexual activity: Not Currently  Other Topics Concern  . Not on file  Social History Narrative   Lives alone in one level. Has two sons 1 daughter and friends who serve as support.   Former Chiropodist; Enjoys Data processing manager, reading   Originally from [place]   Social Determinants of Health   Financial Resource Strain: Eunice   . Difficulty of Paying Living Expenses: Not hard at all  Food Insecurity: No Food Insecurity  . Worried About  Running Out of Food in the Last Year: Never true  . Ran Out of Food in the Last Year: Never true  Transportation Needs: No Transportation Needs  . Lack of Transportation (Medical): No  . Lack of Transportation (Non-Medical): No  Physical Activity: Sufficiently Active  . Days of Exercise per Week: 7 days  . Minutes of Exercise per Session: 30 min  Stress: Stress Concern Present  . Feeling of Stress : Rather much  Social Connections: Unknown  . Frequency of Communication with Friends and Family: More than three times a week  . Frequency of Social Gatherings with Friends and Family: Once a week  . Attends Religious Services: Not on file  . Active Member of Clubs or Organizations: No  . Attends Archivist Meetings: Never  . Marital Status: Divorced  Human resources officer  Violence: Not on file    Outpatient Medications Prior to Visit  Medication Sig Dispense Refill  . acyclovir (ZOVIRAX) 400 MG tablet TAKE 1 TABLET (400 MG TOTAL) BY MOUTH AS NEEDED. 90 tablet 2  . amLODipine (NORVASC) 5 MG tablet Take 1 tablet (5 mg total) by mouth daily. 90 tablet 3  . azelastine (ASTELIN) 0.1 % nasal spray Place 1 spray into both nostrils 2 (two) times daily. Use in each nostril as directed (Patient taking differently: Place 1 spray into both nostrils daily as needed. Use in each nostril as directed) 30 mL 12  . ciclopirox (LOPROX) 0.77 % cream APPLY TO AFFECTED AREA(S) TWO TIMES A DAY AS DIRECTED 15 g 0  . diphenhydrAMINE-zinc acetate (BENADRYL) cream Apply 1 application topically daily as needed for itching.    . fluorouracil (EFUDEX) 5 % cream Apply topically as needed. Per Dr Joaquim Lai    . hydrochlorothiazide (MICROZIDE) 12.5 MG capsule Take 1 capsule (12.5 mg total) by mouth daily. 90 capsule 3  . ibuprofen (ADVIL,MOTRIN) 600 MG tablet Only use 800 mg  8 hours sparingly. (Patient taking differently: Take 600 mg by mouth every 6 (six) hours as needed. Only use 800 mg  8 hours sparingly.) 60 tablet 0  . levocetirizine (XYZAL) 5 MG tablet TAKE ONE TABLET BY MOUTH EVERY EVENING 90 tablet 0  . omeprazole (PRILOSEC) 20 MG capsule TAKE ONE CAPSULE BY MOUTH TWICE A DAY 180 capsule 0  . Oxymetazoline HCl (NASAL SPRAY) 0.05 % SOLN Place into the nose.    . sildenafil (REVATIO) 20 MG tablet TAKE 2 TO 5 TABLETS BY MOUTH 1 HOUR PRIOR TO SEXUAL ACTIVITY 50 tablet 0  . triamcinolone cream (KENALOG) 0.1 % APPLY TO AFFECTED AREA(S) TWO TIMES A DAY AS DIRECTED 30 g 0  . VENTOLIN HFA 108 (90 Base) MCG/ACT inhaler INHALE TWO PUFFS BY MOUTH EVERY 6 HOURS AS NEEDED FOR WHEEZING OR SHORTNESS OF BREATH 18 each 0   No facility-administered medications prior to visit.    No Known Allergies  ROS Review of Systems  Constitutional: Negative for appetite change, chills, fever and unexpected weight  change.  Respiratory: Negative for cough and shortness of breath.   Cardiovascular: Negative for chest pain.  Gastrointestinal: Negative for abdominal pain.  Genitourinary: Negative for dysuria.  Musculoskeletal: Positive for back pain.  Hematological: Negative for adenopathy.      Objective:    Physical Exam Vitals reviewed.  Constitutional:      Appearance: Normal appearance.  Cardiovascular:     Rate and Rhythm: Normal rate and regular rhythm.  Pulmonary:     Effort: Pulmonary effort is normal.  Breath sounds: Normal breath sounds.  Musculoskeletal:     Comments: He has somewhat limited range of motion both hips.  No lumbar tenderness.  Mild left sacroiliac joint tenderness.  Straight leg raise are negative  Neurological:     Mental Status: He is alert.     Comments: He has full strength lower extremities with plantarflexion, dorsiflexion, and knee extension.  Deep tendon reflex 2+ on the right and trace to 1+ on the left.  He has 1+ ankle reflex on the right and trace on the left     BP (!) 150/80 (BP Location: Left Arm, Patient Position: Sitting, Cuff Size: Normal)   Pulse 71   Temp 97.9 F (36.6 C) (Oral)   Ht 5' 6.25" (1.683 m)   Wt 168 lb 14.4 oz (76.6 kg)   BMI 27.06 kg/m  Wt Readings from Last 3 Encounters:  02/09/20 168 lb 14.4 oz (76.6 kg)  10/19/19 163 lb 6.4 oz (74.1 kg)  05/11/19 167 lb 6.4 oz (75.9 kg)     Health Maintenance Due  Topic Date Due  . COVID-19 Vaccine (1) Never done    There are no preventive care reminders to display for this patient.  Lab Results  Component Value Date   TSH 2.96 04/05/2019   Lab Results  Component Value Date   WBC 6.3 04/05/2019   HGB 14.9 04/05/2019   HCT 43.3 04/05/2019   MCV 93.7 04/05/2019   PLT 233.0 04/05/2019   Lab Results  Component Value Date   NA 138 04/05/2019   K 4.0 04/05/2019   CO2 29 04/05/2019   GLUCOSE 107 (H) 04/05/2019   BUN 18 04/05/2019   CREATININE 0.99 04/05/2019   BILITOT  0.5 04/05/2019   ALKPHOS 58 04/05/2019   AST 14 04/05/2019   ALT 12 04/05/2019   PROT 6.7 04/05/2019   ALBUMIN 4.0 04/05/2019   CALCIUM 9.0 04/05/2019   GFR 72.81 04/05/2019   Lab Results  Component Value Date   CHOL 163 04/05/2019   Lab Results  Component Value Date   HDL 47.60 04/05/2019   Lab Results  Component Value Date   LDLCALC 95 04/05/2019   Lab Results  Component Value Date   TRIG 103.0 04/05/2019   Lab Results  Component Value Date   CHOLHDL 3 04/05/2019   Lab Results  Component Value Date   HGBA1C 5.4 12/08/2015      Assessment & Plan:   Problem List Items Addressed This Visit      Unprioritized   PROSTATE CANCER, HX OF - Primary   Relevant Orders   PSA    Other Visit Diagnoses    Left hip pain       Relevant Orders   Sedimentation rate   CBC with Differential/Platelet   CMP   DG Hip Unilat W OR W/O Pelvis 2-3 Views Left    He is describing somewhat poorly localized pain around the hip and left lower lumbar sacroiliac region.  Does not have any red flags such as appetite loss or weight loss.  Remote history of prostate cancer as above as well as history of melanoma  -Obtain labs including PSA, CBC, comprehensive metabolic panel, sed rate -Obtain x-rays left hip and pelvis -If above normal consider short-term trial steroids -We discussed short-term trial of tramadol as needed for nighttime pain -bring back to reassess in two weeks.   No orders of the defined types were placed in this encounter.   Follow-up: No follow-ups on file.  [name], MD

## 2020-02-10 ENCOUNTER — Telehealth: Payer: Self-pay | Admitting: Family Medicine

## 2020-02-10 DIAGNOSIS — M9904 Segmental and somatic dysfunction of sacral region: Secondary | ICD-10-CM | POA: Diagnosis not present

## 2020-02-10 DIAGNOSIS — M9903 Segmental and somatic dysfunction of lumbar region: Secondary | ICD-10-CM | POA: Diagnosis not present

## 2020-02-10 DIAGNOSIS — M9905 Segmental and somatic dysfunction of pelvic region: Secondary | ICD-10-CM | POA: Diagnosis not present

## 2020-02-10 DIAGNOSIS — M5136 Other intervertebral disc degeneration, lumbar region: Secondary | ICD-10-CM | POA: Diagnosis not present

## 2020-02-10 NOTE — Telephone Encounter (Signed)
Patient is returning the call, pleased advise. CB is [phone]

## 2020-02-11 NOTE — Telephone Encounter (Signed)
See results note. 

## 2020-02-12 ENCOUNTER — Ambulatory Visit (HOSPITAL_COMMUNITY)
Admission: EM | Admit: 2020-02-12 | Discharge: 2020-02-12 | Disposition: A | Discharge status: Home / Self Care | Payer: Medicare Other | Attending: Family Medicine | Admitting: Family Medicine

## 2020-02-12 ENCOUNTER — Other Ambulatory Visit: Payer: Self-pay

## 2020-02-12 ENCOUNTER — Encounter (HOSPITAL_COMMUNITY): Payer: Self-pay | Admitting: *Deleted

## 2020-02-12 ENCOUNTER — Ambulatory Visit (HOSPITAL_COMMUNITY): Admit: 2020-02-12 | Discharge status: Absent without leave | Payer: Self-pay

## 2020-02-12 DIAGNOSIS — S39011A Strain of muscle, fascia and tendon of abdomen, initial encounter: Secondary | ICD-10-CM

## 2020-02-12 DIAGNOSIS — R42 Dizziness and giddiness: Secondary | ICD-10-CM | POA: Diagnosis not present

## 2020-02-12 NOTE — Discharge Instructions (Addendum)
Hold off on taking Tramadol for a few days. This may have contributed to your symptoms.

## 2020-02-12 NOTE — ED Triage Notes (Signed)
PT reports Rt groin pain after playing golf on Friday. Pt reports he took a Tramadol 3 times on Friday. Pt reported in creased pain when walking to BR last night. PT is A/o x4 . Pt ambulated to room with out assistance .

## 2020-02-14 NOTE — ED Provider Notes (Signed)
[facility] [address]   [phone] 02/12/20 Arrival Time: 4081  ASSESSMENT & PLAN:  1. Lightheadedness   2. Strain of muscle of right groin region     Normal neurologic exam. No current symptoms of lightheadedness. He is mainly concerned about R groin. Comfortable with OTC analgesics as needed. Avoid strenuous exercise for several days to one week.  As for lightheadedness:  Follow-up Information    [facility].   Specialty: Emergency Medicine Why: If symptoms worsen in any way. Contact information: [address] [id number] [place] [id number]               Reviewed expectations re: course of current medical issues. Questions answered. Outlined signs and symptoms indicating need for more acute intervention. Patient verbalized understanding. After Visit Summary given.   SUBJECTIVE:  Drew Smith is a 79 y.o. male who reports R groin pain after playing golf yesterday. "Pulled it". Ambulatory without difficulty. No extremity sensation changes or weakness. Normal urination without blood. Also reports feeling lightheaded that evening after taking Tramadol. No current lightheadedness. Without CP/SOB.   Social History   Substance and Sexual Activity  Alcohol Use Yes  . Alcohol/week: 7.0 standard drinks  . Types: 7 Glasses of wine per week   Comment: have 1/2 a day; wine or other    Social History   Tobacco Use  Smoking Status Never Smoker  Smokeless Tobacco Never Used   Denies illegal drug use.    OBJECTIVE:  Vitals:   02/12/20 1447 02/12/20 1450  BP: 134/69   Pulse: 75   Resp: 18   Temp: 99.1 F (37.3 C)   TempSrc: Oral   SpO2: 93%   Weight:  76.2 kg  Height:  5\' 9"  (1.753 m)    General appearance: alert; no distress Eyes: PERRLA; EOMI; conjunctiva normal HENT: normocephalic; atraumatic Lungs: clear to auscultation bilaterally Heart: regular rate and  rhythm Abdomen: soft GU: is TTP over R groin muscles Extremities: no cyanosis or edema; symmetrical with no gross deformities Skin: warm and dry Neurologic: normal gait Psychological: alert and cooperative; normal mood and affect    No Known Allergies  Past Medical History:  Diagnosis Date  . Arthritis   . Asthma   . Cancer [facility])    prostate  . GERD (gastroesophageal reflux disease)   . Guillain-Barre syndrome ([place]) 2000  . Hyperlipidemia   . Hypertension    Social History   Socioeconomic History  . Marital status: Divorced    Spouse name: Not on file  . Number of children: 3  . Years of education: Not on file  . Highest education level: Some college, no degree  Occupational History  . Occupation: retired  Tobacco Use  . Smoking status: Never Smoker  . Smokeless tobacco: Never Used  Vaping Use  . Vaping Use: Never used  Substance and Sexual Activity  . Alcohol use: Yes    Alcohol/week: 7.0 standard drinks    Types: 7 Glasses of wine per week    Comment: have 1/2 a day; wine or other   . Drug use: No  . Sexual activity: Not Currently  Other Topics Concern  . Not on file  Social History Narrative   Lives alone in one level. Has two sons 1 daughter and friends who serve as support.   Former Chiropodist; Enjoys Data processing manager, reading   Originally from [place]   Social Determinants of Health   Financial Resource Strain: Low  Risk   . Difficulty of Paying Living Expenses: Not hard at all  Food Insecurity: No Food Insecurity  . Worried About Charity fundraiser in the Last Year: Never true  . Ran Out of Food in the Last Year: Never true  Transportation Needs: No Transportation Needs  . Lack of Transportation (Medical): No  . Lack of Transportation (Non-Medical): No  Physical Activity: Sufficiently Active  . Days of Exercise per Week: 7 days  . Minutes of Exercise per Session: 30 min  Stress: Stress Concern Present  . Feeling of Stress : Rather much  Social  Connections: Unknown  . Frequency of Communication with Friends and Family: More than three times a week  . Frequency of Social Gatherings with Friends and Family: Once a week  . Attends Religious Services: Not on file  . Active Member of Clubs or Organizations: No  . Attends Archivist Meetings: Never  . Marital Status: Divorced  Human resources officer Violence: Not on file   Family History  Problem Relation Age of Onset  . Hyperlipidemia Brother   . Stroke Brother   . Diabetes Brother        type ll  . Alcohol abuse Father    Past Surgical History:  Procedure Laterality Date  . EYE SURGERY  1053   79 years old  . HERNIA REPAIR  1995  . NOSE SURGERY  1990  . PROSTATE SURGERY  2000   seed inplants  . SHOULDER SURGERY  2008      Vanessa Kick, MD 02/14/20 [phone]

## 2020-02-29 DIAGNOSIS — M9904 Segmental and somatic dysfunction of sacral region: Secondary | ICD-10-CM | POA: Diagnosis not present

## 2020-02-29 DIAGNOSIS — M9905 Segmental and somatic dysfunction of pelvic region: Secondary | ICD-10-CM | POA: Diagnosis not present

## 2020-02-29 DIAGNOSIS — M9903 Segmental and somatic dysfunction of lumbar region: Secondary | ICD-10-CM | POA: Diagnosis not present

## 2020-02-29 DIAGNOSIS — M5136 Other intervertebral disc degeneration, lumbar region: Secondary | ICD-10-CM | POA: Diagnosis not present

## 2020-03-07 ENCOUNTER — Ambulatory Visit (INDEPENDENT_AMBULATORY_CARE_PROVIDER_SITE_OTHER): Payer: Medicare Other | Admitting: Family Medicine

## 2020-03-07 ENCOUNTER — Encounter: Payer: Self-pay | Admitting: Family Medicine

## 2020-03-07 ENCOUNTER — Other Ambulatory Visit: Payer: Self-pay

## 2020-03-07 VITALS — BP 152/70 | HR 55 | Ht 69.0 in | Wt 164.0 lb

## 2020-03-07 DIAGNOSIS — M5416 Radiculopathy, lumbar region: Secondary | ICD-10-CM

## 2020-03-07 MED ORDER — LORAZEPAM 0.5 MG PO TABS: ORAL_TABLET | ORAL | 0 refills | Status: DC

## 2020-03-07 MED ORDER — GABAPENTIN 100 MG PO CAPS: ORAL_CAPSULE | ORAL | 3 refills | Status: DC

## 2020-03-07 NOTE — Patient Instructions (Signed)
Lumbosacral Radiculopathy Lumbosacral radiculopathy is a condition that involves the spinal nerves and nerve roots in the low back and bottom of the spine. The condition develops when these nerves and nerve roots move out of place or become inflamed and cause symptoms. What are the causes? This condition may be caused by:  Pressure from a disk that bulges out of place (herniated disk). A disk is a plate of soft cartilage that separates bones in the spine.  Disk changes that occur with age (disk degeneration).  A narrowing of the bones of the lower back (spinal stenosis).  A tumor.  An infection.  An injury that places sudden pressure on the disks that cushion the bones of your lower spine. What increases the risk? You are more likely to develop this condition if:  You are a male who is 59-7 years old.  You are a male who is 47-38 years old.  You use improper technique when lifting things.  You are overweight or live a sedentary lifestyle.  You smoke.  Your work requires frequent lifting.  You do repetitive activities that strain the spine. What are the signs or symptoms? Symptoms of this condition include:  Pain that goes down from your back into your legs (sciatica), usually on one side of the body. This is the most common symptom. The pain may be worse with sitting, coughing, or sneezing.  Pain and numbness in your legs.  Muscle weakness.  Tingling.  Loss of bladder control or bowel control.   How is this diagnosed? This condition may be diagnosed based on:  Your symptoms and medical history.  A physical exam. If the pain is lasting, you may have tests, such as:  MRI scan.  X-ray.  CT scan.  A type of X-ray used to examine the spinal canal after injecting a dye into your spine (myelogram).  A test to measure how electrical impulses move through a nerve (nerve conduction study). How is this treated? Treatment may depend on the cause of the condition  and may include:  Working with a physical therapist.  Taking pain medicine.  Applying heat and ice to affected areas.  Doing stretches to improve flexibility.  Doing exercises to strengthen back muscles.  Having chiropractic spinal manipulation.  Using transcutaneous electrical nerve stimulation (TENS) therapy.  Getting a steroid injection in the spine. In some cases, no treatment is needed. If the condition is long-lasting (chronic), or if symptoms are severe, treatment may involve surgery or lifestyle changes, such as following a weight-loss plan. Follow these instructions at home: Activity  Avoid bending and other activities that make the problem worse.  Maintain a proper position when standing or sitting: ? When standing, keep your upper back and neck straight, with your shoulders pulled back. Avoid slouching. ? When sitting, keep your back straight and relax your shoulders. Do not round your shoulders or pull them backward.  Do not sit or stand in one place for long periods of time.  Take brief periods of rest throughout the day. This will reduce your pain. It is usually better to rest by lying down or standing, not sitting.  When you are resting for longer periods, mix in some mild activity or stretching between periods of rest. This will help to prevent stiffness and pain.  Get regular exercise. Ask your health care provider what activities are safe for you. If you were shown how to do any exercises or stretches, do them as directed by your health care provider.  Do not lift anything that is heavier than 10 lb (4.5 kg) or the limit that you are told by your health care provider. Always use proper lifting technique, which includes: ? Bending your knees. ? Keeping the load close to your body. ? Avoiding twisting. Managing pain  If directed, put ice on the affected area: ? Put ice in a plastic bag. ? Place a towel between your skin and the bag. ? Leave the ice on for  20 minutes, 2-3 times a day.  If directed, apply heat to the affected area as often as told by your health care provider. Use the heat source that your health care provider recommends, such as a moist heat pack or a heating pad. ? Place a towel between your skin and the heat source. ? Leave the heat on for 20-30 minutes. ? Remove the heat if your skin turns bright red. This is especially important if you are unable to feel pain, heat, or cold. You may have a greater risk of getting burned.  Take over-the-counter and prescription medicines only as told by your health care provider. General instructions  Sleep on a firm mattress in a comfortable position. Try lying on your side with your knees slightly bent. If you lie on your back, put a pillow under your knees.  Do not drive or use heavy machinery while taking prescription pain medicine.  If your health care provider prescribed a diet or exercise program, follow it as directed.  Keep all follow-up visits as told by your health care provider. This is important. Contact a health care provider if:  Your pain does not improve over time, even when taking pain medicines. Get help right away if:  You develop severe pain.  Your pain suddenly gets worse.  You develop increasing weakness in your legs.  You lose the ability to control your bladder or bowel.  You have difficulty walking or balancing.  You have a fever. Summary  Lumbosacral radiculopathy is a condition that occurs when the spinal nerves and nerve roots in the lower part of the spine move out of place or become inflamed and cause symptoms.  Symptoms include pain, numbness, and tingling that go down from your back into your legs (sciatica), muscle weakness, and loss of bladder control or bowel control.  If directed, apply ice or heat to the affected area as told by your health care provider.  Follow instructions about activity, rest, and proper lifting technique. This  information is not intended to replace advice given to you by your health care provider. Make sure you discuss any questions you have with your health care provider. Document Revised: 12/23/2019 Document Reviewed: 12/23/2019 Elsevier Patient Education  2021 [employer].

## 2020-03-07 NOTE — Progress Notes (Signed)
Established Patient Office Visit  Subjective:  Patient ID: Drew Smith, male    DOB: 02/12/1941  Age: 80 y.o. MRN: [id number]  CC:  Chief Complaint  Patient presents with  . Follow-up  . Leg Pain    Left leg    HPI Drew Smith presents for progressive pain left buttock, left hip and now left lower extremity area.  Refer to last note for details.  He had months of progressive symptoms.  This initially seemed to be more in the hip region.  He describes some pain going into his thigh occasionally but now going down frequently to the foot.  Does have some intermittent tingling left lower extremity.  No definite weakness.  He has been to chiropractor multiple times without much improvement.  Does have past history of melanoma as well as prostate cancer.  No appetite or weight changes.  No fever.  No urine or stool incontinence.  We obtain recent labs.  Sed rate was 30.  CBC and chemistries unremarkable.  Pain film of hip and pelvis revealed no blastic or lytic bone lesions.  No major acute changes We will try tramadol but he had some associated dizziness.  Occasionally takes at night but limited by daytime use.  He is tried some Aleve and Tylenol with minimal relief.[name]  He describes the pain as sharp and severe at times.  Interfering more with sleep.  He is reluctant to take prednisone because of prior history of Guillain-Barr syndrome.  He had just received prednisone at the time that he was diagnosed with Guillain-Barr.  Generally very active.  He usually plays golf several times per week but unable to play now for several months because of the pain.  He is also tried basic stretches without improvement.  Past Medical History:  Diagnosis Date  . Arthritis   . Asthma   . Cancer [facility])    prostate  . GERD (gastroesophageal reflux disease)   . Guillain-Barre syndrome ([place]) 2000  . Hyperlipidemia   . Hypertension     Past Surgical History:  Procedure Laterality Date  . EYE  SURGERY  3762   80 years old  . HERNIA REPAIR  1995  . NOSE SURGERY  1990  . PROSTATE SURGERY  2000   seed inplants  . SHOULDER SURGERY  2008    Family History  Problem Relation Age of Onset  . Hyperlipidemia Brother   . Stroke Brother   . Diabetes Brother        type ll  . Alcohol abuse Father     Social History   Socioeconomic History  . Marital status: Divorced    Spouse name: Not on file  . Number of children: 3  . Years of education: Not on file  . Highest education level: Some college, no degree  Occupational History  . Occupation: retired  Tobacco Use  . Smoking status: Never Smoker  . Smokeless tobacco: Never Used  Vaping Use  . Vaping Use: Never used  Substance and Sexual Activity  . Alcohol use: Yes    Alcohol/week: 7.0 standard drinks    Types: 7 Glasses of wine per week    Comment: have 1/2 a day; wine or other   . Drug use: No  . Sexual activity: Not Currently  Other Topics Concern  . Not on file  Social History Narrative   Lives alone in one level. Has two sons 1 daughter and friends who serve as support.   Former Scientist, product/process development  player; Enjoys shag dancing, reading   Originally from [place]   Social Determinants of Health   Financial Resource Strain: Low Risk   . Difficulty of Paying Living Expenses: Not hard at all  Food Insecurity: No Food Insecurity  . Worried About Charity fundraiser in the Last Year: Never true  . Ran Out of Food in the Last Year: Never true  Transportation Needs: No Transportation Needs  . Lack of Transportation (Medical): No  . Lack of Transportation (Non-Medical): No  Physical Activity: Sufficiently Active  . Days of Exercise per Week: 7 days  . Minutes of Exercise per Session: 30 min  Stress: Stress Concern Present  . Feeling of Stress : Rather much  Social Connections: Unknown  . Frequency of Communication with Friends and Family: More than three times a week  . Frequency of Social Gatherings with Friends and Family: Once  a week  . Attends Religious Services: Not on file  . Active Member of Clubs or Organizations: No  . Attends Archivist Meetings: Never  . Marital Status: Divorced  Human resources officer Violence: Not on file    Outpatient Medications Prior to Visit  Medication Sig Dispense Refill  . acyclovir (ZOVIRAX) 400 MG tablet TAKE 1 TABLET (400 MG TOTAL) BY MOUTH AS NEEDED. 90 tablet 2  . amLODipine (NORVASC) 5 MG tablet Take 1 tablet (5 mg total) by mouth daily. 90 tablet 3  . azelastine (ASTELIN) 0.1 % nasal spray Place 1 spray into both nostrils 2 (two) times daily. Use in each nostril as directed (Patient taking differently: Place 1 spray into both nostrils daily as needed. Use in each nostril as directed) 30 mL 12  . ciclopirox (LOPROX) 0.77 % cream APPLY TO AFFECTED AREA(S) TWO TIMES A DAY AS DIRECTED 15 g 0  . diphenhydrAMINE-zinc acetate (BENADRYL) cream Apply 1 application topically daily as needed for itching.    . fluorouracil (EFUDEX) 5 % cream Apply topically as needed. Per Dr Joaquim Lai    . hydrochlorothiazide (MICROZIDE) 12.5 MG capsule Take 1 capsule (12.5 mg total) by mouth daily. 90 capsule 3  . ibuprofen (ADVIL,MOTRIN) 600 MG tablet Only use 800 mg  8 hours sparingly. (Patient taking differently: Take 600 mg by mouth every 6 (six) hours as needed. Only use 800 mg  8 hours sparingly.) 60 tablet 0  . levocetirizine (XYZAL) 5 MG tablet TAKE ONE TABLET BY MOUTH EVERY EVENING 90 tablet 0  . omeprazole (PRILOSEC) 20 MG capsule TAKE ONE CAPSULE BY MOUTH TWICE A DAY 180 capsule 0  . Oxymetazoline HCl (NASAL SPRAY) 0.05 % SOLN Place into the nose.    . sildenafil (REVATIO) 20 MG tablet TAKE 2 TO 5 TABLETS BY MOUTH 1 HOUR PRIOR TO SEXUAL ACTIVITY 50 tablet 0  . tretinoin (RETIN-A) 0.05 % cream SMARTSIG:1 Sparingly Topical Every Night    . triamcinolone cream (KENALOG) 0.1 % APPLY TO AFFECTED AREA(S) TWO TIMES A DAY AS DIRECTED 30 g 0  . VENTOLIN HFA 108 (90 Base) MCG/ACT inhaler INHALE TWO  PUFFS BY MOUTH EVERY 6 HOURS AS NEEDED FOR WHEEZING OR SHORTNESS OF BREATH 18 each 0   No facility-administered medications prior to visit.    No Known Allergies  ROS Review of Systems  Constitutional: Negative for appetite change, chills, fever and unexpected weight change.  Respiratory: Negative for shortness of breath.   Cardiovascular: Negative for chest pain.  Gastrointestinal: Negative for abdominal pain.  Genitourinary: Negative for dysuria.  Neurological: Positive for numbness.  Hematological: Negative for adenopathy. Does not bruise/bleed easily.      Objective:    Physical Exam Vitals reviewed.  Constitutional:      Appearance: Normal appearance.  Cardiovascular:     Rate and Rhythm: Normal rate.  Pulmonary:     Effort: Pulmonary effort is normal.     Breath sounds: Normal breath sounds.  Musculoskeletal:     Right lower leg: No edema.     Left lower leg: No edema.     Comments: Straight leg raises are negative.  Full range of motion left hip  Neurological:     Mental Status: He is alert.     Comments: Full strength with plantarflexion and dorsiflexion bilaterally.  Full strength with knee extension bilaterally.  He seems to have slightly diminished left knee reflex compared to the right but 1+ both ankles     BP (!) 152/70   Pulse (!) 55   Ht 5\' 9"  (1.753 m)   Wt 164 lb (74.4 kg)   SpO2 98%   BMI 24.22 kg/m  Wt Readings from Last 3 Encounters:  03/07/20 164 lb (74.4 kg)  02/12/20 168 lb (76.2 kg)  02/09/20 168 lb 14.4 oz (76.6 kg)     Health Maintenance Due  Topic Date Due  . COVID-19 Vaccine (1) Never done    There are no preventive care reminders to display for this patient.  Lab Results  Component Value Date   TSH 2.96 04/05/2019   Lab Results  Component Value Date   WBC 7.9 02/09/2020   HGB 13.8 02/09/2020   HCT 40.4 02/09/2020   MCV 93.0 02/09/2020   PLT 231.0 02/09/2020   Lab Results  Component Value Date   NA 136 02/09/2020    K 4.1 02/09/2020   CO2 28 02/09/2020   GLUCOSE 146 (H) 02/09/2020   BUN 15 02/09/2020   CREATININE 0.89 02/09/2020   BILITOT 0.6 02/09/2020   ALKPHOS 70 02/09/2020   AST 12 02/09/2020   ALT 11 02/09/2020   PROT 6.5 02/09/2020   ALBUMIN 3.8 02/09/2020   CALCIUM 9.0 02/09/2020   GFR 80.97 02/09/2020   Lab Results  Component Value Date   CHOL 163 04/05/2019   Lab Results  Component Value Date   HDL 47.60 04/05/2019   Lab Results  Component Value Date   LDLCALC 95 04/05/2019   Lab Results  Component Value Date   TRIG 103.0 04/05/2019   Lab Results  Component Value Date   CHOLHDL 3 04/05/2019   Lab Results  Component Value Date   HGBA1C 5.4 12/08/2015      Assessment & Plan:   Problem List Items Addressed This Visit   None   Visit Diagnoses    Left lumbar radiculitis    -  Primary   Relevant Medications   LORazepam (ATIVAN) 0.5 MG tablet   gabapentin (NEURONTIN) 100 MG capsule    Patient relates progressive pain for months now into the left lower extremity.  Does not have any demonstratable weakness but pain has been progressive.  Unrelieved with multiple conservative therapies including chiropractic care.  Does have remote history of prostate cancer and melanoma.  Recent PSA stable  -Set up MRI to further assess -We discussed trial of low-dose gabapentin 100 mg nightly and may titrate gradually up to 300 mg if necessary.  Reviewed potential side effects including lightheadedness and dizziness.  May supplement with Tylenol  Meds ordered this encounter  Medications  . LORazepam (ATIVAN) 0.5 MG  tablet    Sig: Take one to two tablets one hour prior to procedure    Dispense:  2 tablet    Refill:  0  . gabapentin (NEURONTIN) 100 MG capsule    Sig: Take one to two capsules by mouth at night as needed for back pain.    Dispense:  60 capsule    Refill:  3    Follow-up: No follow-ups on file.    [name], MD

## 2020-03-09 DIAGNOSIS — M9903 Segmental and somatic dysfunction of lumbar region: Secondary | ICD-10-CM | POA: Diagnosis not present

## 2020-03-09 DIAGNOSIS — M9904 Segmental and somatic dysfunction of sacral region: Secondary | ICD-10-CM | POA: Diagnosis not present

## 2020-03-09 DIAGNOSIS — M9905 Segmental and somatic dysfunction of pelvic region: Secondary | ICD-10-CM | POA: Diagnosis not present

## 2020-03-09 DIAGNOSIS — M5136 Other intervertebral disc degeneration, lumbar region: Secondary | ICD-10-CM | POA: Diagnosis not present

## 2020-03-14 ENCOUNTER — Telehealth: Payer: Self-pay

## 2020-03-14 DIAGNOSIS — I10 Essential (primary) hypertension: Secondary | ICD-10-CM

## 2020-03-14 DIAGNOSIS — E785 Hyperlipidemia, unspecified: Secondary | ICD-10-CM

## 2020-03-14 NOTE — Telephone Encounter (Signed)
-----   Message from Viona Gilmore, [facility] sent at 03/14/2020  1:43 PM EST ----- Regarding: CCM referral Hi again,  Can you please place a CCM referral for one of Dr. Erick Blinks patients, Drew Smith?  Thank you so much! Maddie

## 2020-03-15 ENCOUNTER — Telehealth: Payer: Self-pay | Admitting: Pharmacist

## 2020-03-15 NOTE — Chronic Care Management (AMB) (Signed)
Chronic Care Management Pharmacy  Name: Drew Smith  MRN: [id number] DOB: January 23, 1941  Initial Planning Appointment: completed 03/15/20  Initial Questions: 1. Have you seen any other providers since your last visit? n/a 2. Any changes in your medicines or health? Yes   Chief Complaint/ HPI  Drew Smith,  80 y.o. , male presents for their Initial CCM visit with the clinical pharmacist In office.  PCP : [name], MD  Their chronic conditions include: HTN, GERD, allergic rhinitis, pain, cold sore prevention  Office Visits: -03/07/20 [name], MD: Patient presented for leg pain follow up. Prescribed gabapentin PRN back pain and lorazepam prior to procedure.  -02/09/20 [name], MD: Patient presented for back pain. Prescribed tramadol PRN.  -10/19/19 [name], MD: Patient presented for HTN follow up and chest pain.  Consult Visit: -02/12/20 Patient presented to the ED due to leg injury and lightheadedness.  -11/09/19 Baruch Merl, PT (rehab): Patient presented for PT treatment for shoulder pain.  -10/18/19 Karin Golden (dermatology): Unable to access notes.   Medications: Outpatient Encounter Medications as of 03/16/2020  Medication Sig  . acyclovir (ZOVIRAX) 400 MG tablet TAKE 1 TABLET (400 MG TOTAL) BY MOUTH AS NEEDED.  [name] amLODipine (NORVASC) 5 MG tablet Take 1 tablet (5 mg total) by mouth daily.  [name] azelastine (ASTELIN) 0.1 % nasal spray Place 1 spray into both nostrils 2 (two) times daily. Use in each nostril as directed  . ciclopirox (LOPROX) 0.77 % cream APPLY TO AFFECTED AREA(S) TWO TIMES A DAY AS DIRECTED  . desonide (DESOWEN) 0.05 % cream Apply 1 application topically 2 (two) times daily as needed.  . hydrochlorothiazide (MICROZIDE) 12.5 MG capsule Take 1 capsule (12.5 mg total) by mouth daily.  [name] ibuprofen (ADVIL,MOTRIN) 600 MG tablet Only use 800 mg  8 hours sparingly. (Patient taking differently: Take 600 mg by mouth every 6  (six) hours as needed. Only use 800 mg  8 hours sparingly.)  . levocetirizine (XYZAL) 5 MG tablet TAKE ONE TABLET BY MOUTH EVERY EVENING  . Multiple Vitamins-Minerals (CENTRUM SILVER 50+MEN PO) Take 1 tablet by mouth daily as needed.  [name] omeprazole (PRILOSEC) 20 MG capsule TAKE ONE CAPSULE BY MOUTH TWICE A DAY  . traMADol (ULTRAM) 50 MG tablet Take 50 mg by mouth every 6 (six) hours as needed.  . triamcinolone cream (KENALOG) 0.1 % APPLY TO AFFECTED AREA(S) TWO TIMES A DAY AS DIRECTED  . VITAMIN D, CHOLECALCIFEROL, PO Take 1 tablet by mouth daily as needed.  . zinc gluconate 50 MG tablet Take 50 mg by mouth daily as needed.  . fluorouracil (EFUDEX) 5 % cream Apply topically as needed. Per Dr Joaquim Lai (Patient not taking: Reported on 03/16/2020)  . gabapentin (NEURONTIN) 100 MG capsule Take one to two capsules by mouth at night as needed for back pain. (Patient not taking: Reported on 03/16/2020)  . LORazepam (ATIVAN) 0.5 MG tablet Take one to two tablets one hour prior to procedure  . Oxymetazoline HCl (NASAL SPRAY) 0.05 % SOLN Place into the nose. (Patient not taking: Reported on 03/16/2020)  . tretinoin (RETIN-A) 0.05 % cream SMARTSIG:1 Sparingly Topical Every Night (Patient not taking: Reported on 03/16/2020)  . [DISCONTINUED] diphenhydrAMINE-zinc acetate (BENADRYL) cream Apply 1 application topically daily as needed for itching. (Patient not taking: Reported on 03/16/2020)  . [DISCONTINUED] sildenafil (REVATIO) 20 MG tablet TAKE 2 TO 5 TABLETS BY MOUTH 1 HOUR PRIOR TO SEXUAL ACTIVITY  . [DISCONTINUED] VENTOLIN HFA 108 (90 Base) MCG/ACT inhaler  INHALE TWO PUFFS BY MOUTH EVERY 6 HOURS AS NEEDED FOR WHEEZING OR SHORTNESS OF BREATH   No facility-administered encounter medications on file as of 03/16/2020.   Patient lives alone but reports having family nearby that he sees pretty often. He doesn't enjoy sitting around and does a lot of stretching/exercises. He also enjoys reading the newspaper, doing  yardwork, and used to play a lot of golf before his leg problems. His biggest concern regarding his health is his leg pain.  He was doing balance exercises but has been unable to do a lot of exercise as it has been too painful. Patient reports his sleep quality can vary. He slept well the night before last but slept very little last night. He does feel tired during the day and takes accidental naps.  He cooks for himself and tries to eat as healthy as possible. He eats lots of fruit at breakfast with coffee. He tries to eating as little meat as possible but does eat a lot of fish.  He had shrimp last night with a salad. He does eat a fair amount of veggies and only nibbles on desserts.  He reports that he likes to avoid taking medications if possible but doesn't have issues with his current ones. He has had a cough from medicines in the past.   Current Diagnosis/Assessment:  Goals Addressed            This Visit's Progress   . Pharmacy care plan       CARE PLAN ENTRY (see longitudinal plan of care for additional care plan information)  Current Barriers:  . Chronic Disease Management support, education, and care coordination needs related to Hypertension, GERD, and pain   Hypertension BP Readings from Last 3 Encounters:  03/16/20 (!) 146/70  03/07/20 (!) 152/70  02/12/20 134/69   . Pharmacist Clinical Goal(s): o Over the next 90 days, patient will work with PharmD and providers to achieve BP goal <140/90 . Current regimen:  . Amlodipine 5 mg 1 tablet daily . Hydrochlorothiazide 12.5 mg 1 capsule daily . Interventions: o Discussed DASH eating plan recommendations: . Emphasizes vegetables, fruits, and whole-grains . Includes fat-free or low-fat dairy products, fish, poultry, beans, nuts, and vegetable oils . Limits foods that are high in saturated fat. These foods include fatty meats, full-fat dairy products, and tropical oils such as coconut, palm kernel, and palm oils. . Limits  sugar-sweetened beverages and sweets . Limiting sodium intake to < 1500 mg/day o Discussed the importance of monitoring blood pressure at home while taking blood pressure medications . Patient self care activities - Over the next 90 days, patient will: o Check blood pressure a couple times weekly, document, and provide at future appointments o Ensure daily salt intake < 2300 mg/day  GERD  . Pharmacist Clinical Goal(s): o Over the next 90 days, patient will work with PharmD and providers to manage symptoms of heartburn or acid reflux . Current regimen:  o Omeprazole 20 mg 1 capsule once daily . Interventions: o Discussed non-pharmacologic management of symptoms such as elevating the head of your bed, avoiding eating 2-3 hours before bed, avoiding triggering foods such as acidic, spicy, or fatty foods, eating smaller meals, and wearing clothes that are loose around the waist o Discussed long term risks of taking omeprazole such as increased risk for falls, fractures and vitamin deficiencies . Patient self care activities - Over the next 90 days, patient will: o Continue current medication  Pain . Pharmacist Clinical  Goal(s): o Over the next 90 days, patient will work with PharmD and providers to manage symptoms of pain . Current regimen:  . Gabapentin 100 mg 1-2 capsules at night as needed  . Ibuprofen 800 mg 1 tablet as needed  . Tylenol 500 mg 1 tablet in AM in and 1 tablet in PM  . Aleve 220 mg 1 tablet daily as needed  . Tramadol 50 mg 1 tablet PRN . Interventions: o Discussed not taking any Darovcet as this was recalled several years ago due to the risk for heart disease o Recommended avoiding taking both Aleve and ibuprofen as they can cause stomach bleeding and damage your kidneys o Discussed maximum recommended dose of 3,000 mg per day of Tylenol . Patient self care activities - Over the next 90 days, patient will: o Start taking gabapentin for pain o Avoid using both Aleve  and ibuprofen for pain  Medication management . Pharmacist Clinical Goal(s): o Over the next 90 days, patient will work with PharmD and providers to maintain optimal medication adherence . [facility] . Interventions o Comprehensive medication review performed. o Continue current medication management strategy . Patient self care activities - Over the next 90 days, patient will: o Take medications as prescribed o Report any questions or concerns to PharmD and/or provider(s)  Initial goal documentation         SDOH Interventions   Flowsheet Row Most Recent Value  SDOH Interventions   Financial Strain Interventions Intervention Not Indicated  Transportation Interventions Intervention Not Indicated      Hypertension   BP goal is:  <140/90  Office blood pressures are  BP Readings from Last 3 Encounters:  03/16/20 (!) 146/70  03/07/20 (!) 152/70  02/12/20 134/69   Patient checks BP at home infrequently Patient home BP readings are ranging: n/a  Patient has failed these meds in the past: none Patient is currently uncontrolled on the following medications:  . Amlodipine 5 mg 1 tablet daily . Hydrochlorothiazide 12.5 mg 1 capsule daily  We discussed diet and exercise extensively   -DASH eating plan recommendations: . Emphasizes vegetables, fruits, and whole-grains . Includes fat-free or low-fat dairy products, fish, poultry, beans, nuts, and vegetable oils . Limits foods that are high in saturated fat. These foods include fatty meats, full-fat dairy products, and tropical oils such as coconut, palm kernel, and palm oils. . Limits sugar-sweetened beverages and sweets . Limiting sodium intake to < 1500 mg/day -Diet: uses very little salt, comparing  -Strokes run in the family  Plan BP assessment with CPA in 1 month. Continue current medications    GERD   Patient has failed these meds in past: none Patient is currently controlled on the  following medications:  . Omeprazole 20 mg 1 capsule once daily  We discussed:  Non-pharmacologic management of symptoms such as elevating the head of your bed, avoiding eating 2-3 hours before bed, avoiding triggering foods such as acidic, spicy, or fatty foods, eating smaller meals, and wearing clothes that are loose around the waist; discussed long term risks of taking PPIs  Plan Will discuss with PCP about possible taper. Continue current medications  Allergic rhinitis   Patient has failed these meds in past: none Patient is currently controlled on the following medications:  . Azelastine 0.1% nasal spray 1 spray in both nostrils twice daily PRN . Xyzal 5 mg 1 tablet every evening PRN . Oxymetazoline nasal spray 0.05% PRN   We discussed:  avoiding consistent  use of Afrin due to rebound congestion and increased BP   Plan  Continue current medications  Pain   Patient has failed these meds in past: none Patient is currently uncontrolled on the following medications:  . Gabapentin 100 mg 1-2 capsules at night PRN - not started yet  . Ibuprofen 800 mg 1 tablet PRN  . Tylenol 500 mg 1 tablet in AM in and 1 tablet in PM  . Aleve 220 mg 1 tablet daily PRN . Darvocet - had a few tablets of this . Tramadol 50 mg 1 tablet PRN - taking one per day  We discussed:  Maximum recommended dose of 3,000 mg/day of Tylenol; discussed limiting use of NSAIDs due to GI bleeds and kidney damage  Plan Patient requested ibuprofen prescription to be sent it - sent message to PCP and he sent it. Recommended for patient to not take Aleve while taking. Continue current medications   Topical therapies   Patient has failed these meds in past: unknown Patient is currently controlled on the following medications:  . Triamcinolone cream 0.1% apply to affected areas twice daily . Tretinoin 0.05% cream apply every night . Fluorouracil apply topically PRN . Benadryl cream daily PRN . Ciclopirox 0.77%  cream apply to affected area twice daily . Betamethasone lotion   Plan  Continue current medications  Cold sore prevention   Patient is currently on the following medications:  . Acyclovir 400 mg 1 tablet PRN   We discussed:  Patient only takes 1-2 times a month for cold sores  Plan  Continue current medications   Miscellaneous   Patient is currently on the following medications:  . Lipoflavanoid for ear ringing . Leg cramping tablet . Tonic water with quinine  We discussed:  Drinking plenty of water and increasing potassium intake through diet (patient eats bananas every day)  Plan  Continue current medications   Vaccines   Reviewed and discussed patient's vaccination history.    Immunization History  Administered Date(s) Administered  . Pneumococcal Polysaccharide-23 11/28/2010   Patient has a history of guillain barre syndrome and prefers to not get vaccines due to this.  Plan  No recommendations at this time. Can consider discussion with PCP.  Medication Management   Patient's preferred pharmacy is:  [facility] [address], [place] [address] Phone: [phone] Fax: [phone]  Uses pill box? No - writing it down Pt endorses 90% compliance  We discussed: Current pharmacy is preferred with insurance plan and patient is satisfied with pharmacy services  Plan  Continue current medication management strategy   Follow up: 3 month office visit  Jeni Salles, PharmD [place] Pharmacist [facility] [place] [phone]

## 2020-03-15 NOTE — Progress Notes (Signed)
Chronic Care Management Pharmacy Assistant   Name: Drew Smith  MRN: [id number] DOB: [birth year]  Reason for Encounter: Initial Questions  Patient Questions:  1.  Have you seen any other providers since your last visit? Yes, The patient had an ER visit on 02/21/2020  2.  Any changes in your medicines or health? No   Drew Smith,  80 y.o. , male presents for their Initial CCM visit with the clinical pharmacist In office.  PCP : Drew Post, MD  Allergies:  No Known Allergies  Medications: Outpatient Encounter Medications as of 03/15/2020  Medication Sig  . acyclovir (ZOVIRAX) 400 MG tablet TAKE 1 TABLET (400 MG TOTAL) BY MOUTH AS NEEDED.  [name] amLODipine (NORVASC) 5 MG tablet Take 1 tablet (5 mg total) by mouth daily.  [name] azelastine (ASTELIN) 0.1 % nasal spray Place 1 spray into both nostrils 2 (two) times daily. Use in each nostril as directed (Patient taking differently: Place 1 spray into both nostrils daily as needed. Use in each nostril as directed)  . ciclopirox (LOPROX) 0.77 % cream APPLY TO AFFECTED AREA(S) TWO TIMES A DAY AS DIRECTED  . diphenhydrAMINE-zinc acetate (BENADRYL) cream Apply 1 application topically daily as needed for itching.  . fluorouracil (EFUDEX) 5 % cream Apply topically as needed. Per Dr Drew Smith  . gabapentin (NEURONTIN) 100 MG capsule Take one to two capsules by mouth at night as needed for back pain.  . hydrochlorothiazide (MICROZIDE) 12.5 MG capsule Take 1 capsule (12.5 mg total) by mouth daily.  [name] ibuprofen (ADVIL,MOTRIN) 600 MG tablet Only use 800 mg  8 hours sparingly. (Patient taking differently: Take 600 mg by mouth every 6 (six) hours as needed. Only use 800 mg  8 hours sparingly.)  . levocetirizine (XYZAL) 5 MG tablet TAKE ONE TABLET BY MOUTH EVERY EVENING  . LORazepam (ATIVAN) 0.5 MG tablet Take one to two tablets one hour prior to procedure  . omeprazole (PRILOSEC) 20 MG capsule TAKE ONE CAPSULE BY MOUTH TWICE A DAY  .  Oxymetazoline HCl (NASAL SPRAY) 0.05 % SOLN Place into the nose.  . sildenafil (REVATIO) 20 MG tablet TAKE 2 TO 5 TABLETS BY MOUTH 1 HOUR PRIOR TO SEXUAL ACTIVITY  . tretinoin (RETIN-A) 0.05 % cream SMARTSIG:1 Sparingly Topical Every Night  . triamcinolone cream (KENALOG) 0.1 % APPLY TO AFFECTED AREA(S) TWO TIMES A DAY AS DIRECTED  . VENTOLIN HFA 108 (90 Base) MCG/ACT inhaler INHALE TWO PUFFS BY MOUTH EVERY 6 HOURS AS NEEDED FOR WHEEZING OR SHORTNESS OF BREATH   No facility-administered encounter medications on file as of 03/15/2020.    Current Diagnosis: Patient Active Problem List   Diagnosis Date Noted  . Malignant melanoma of left forearm ([place]) 10/19/2019  . Upper respiratory tract infection 02/14/2017  . Porokeratosis 09/07/2014  . Metatarsal deformity 09/07/2014  . Pain in lower limb 09/07/2014  . Squamous cell carcinoma in situ of skin 10/15/2013  . Hives 10/06/2012  . History of Guillain-Barre syndrome 03/07/2011  . Hypertension 11/28/2010  . Dyslipidemia 11/28/2010  . ERECTILE DYSFUNCTION 01/05/2008  . ASTHMA 10/28/2007  . GERD 10/28/2007  . DIZZINESS 10/28/2007  . COUGH 10/28/2007  . FASTING HYPERGLYCEMIA 10/28/2007  . PROSTATE CANCER, HX OF 10/28/2007    Goals Addressed   None     Follow-Up:  Pharmacist Review    Have you seen any other providers since your last visit? Yes, The patient had an ER visit on 02/12/2020 for lightheadedness an d pain on left side  Any changes in your medications or health? no Any side effects from any medications? no Do you have an symptoms or problems not managed by your medications? no Any concerns about your health right now? Yes, The patient stated that he has been having pain on his left side for a while with no relief from taking tylenol or aspirin.  Has your provider asked that you check blood pressure, blood sugar, or follow special diet at home? no Do you get any type of exercise on a regular basis? no Can you think of a  goal you would like to reach for your health? He would like to be able to find some relief with his pain the patient did state that Ibuprofen 800mg  helps him with his pain. He would like to discuss possibly re-starting the medication.  Do you have any problems getting your medications? no Is there anything that you would like to discuss during the appointment? No  Please bring medications and supplements to appointment   Drew Smith, [facility]  Practice Team Manager/ CPA (Clinical Pharmacist Assistant) [phone]

## 2020-03-16 ENCOUNTER — Other Ambulatory Visit: Payer: Self-pay

## 2020-03-16 ENCOUNTER — Ambulatory Visit: Payer: Medicare Other | Admitting: Pharmacist

## 2020-03-16 VITALS — BP 146/70

## 2020-03-16 DIAGNOSIS — E785 Hyperlipidemia, unspecified: Secondary | ICD-10-CM

## 2020-03-16 DIAGNOSIS — I10 Essential (primary) hypertension: Secondary | ICD-10-CM

## 2020-03-17 ENCOUNTER — Other Ambulatory Visit: Payer: Self-pay | Admitting: Family Medicine

## 2020-03-17 MED ORDER — IBUPROFEN 800 MG PO TABS: 800.0000 mg | ORAL_TABLET | Freq: Three times a day (TID) | ORAL | 0 refills | Status: DC | PRN

## 2020-03-21 NOTE — Patient Instructions (Addendum)
Hi Drew Smith,  It was lovely to meet you in person! As we discussed, I definitely think it would be beneficial for you to check your blood pressure at home to make sure your medications are working properly. I will have my assistant reach out to you in about a month to check in on those. Below is a summary of the rest of the topics we discussed.  Dr. Elease Hashimoto did send in a short course of ibuprofen  but please make sure to avoid taking with Aleve. He also sent in a steroid taper for you as well.  Let me know if you have any questions or need anything before our follow up!  Best, Maddie  Drew Smith, PharmD [facility] Clinical Pharmacist [place] [facility] [place]   Visit Information  Goals Addressed   None     Drew Smith was given information about Chronic Care Management services today including:  1. CCM service includes personalized support from designated clinical staff supervised by his physician, including individualized plan of care and coordination with other care providers 2. 24/7 contact phone numbers for assistance for urgent and routine care needs. 3. Standard insurance, coinsurance, copays and deductibles apply for chronic care management only during months in which we provide at least 20 minutes of these services. Most insurances cover these services at 100%, however patients may be responsible for any copay, coinsurance and/or deductible if applicable. This service may help you avoid the need for more expensive face-to-face services. 4. Only one practitioner may furnish and bill the service in a calendar month. 5. The patient may stop CCM services at any time (effective at the end of the month) by phone call to the office staff.  Patient agreed to services and verbal consent obtained.   The patient verbalized understanding of instructions, educational materials, and care plan provided today and agreed to receive a mailed copy of patient instructions,  educational materials, and care plan.  Telephone follow up appointment with pharmacy team member scheduled for: 3 months  Viona Gilmore, [facility]  How to Take Your Blood Pressure Blood pressure measures how strongly your blood is pressing against the walls of your arteries. Arteries are blood vessels that carry blood from your heart throughout your body. You can take your blood pressure at home with a machine. You may need to check your blood pressure at home:  To check if you have high blood pressure (hypertension).  To check your blood pressure over time.  To make sure your blood pressure medicine is working. Supplies needed:  Blood pressure machine, or monitor.  Dining room chair to sit in.  Table or desk.  Small notebook.  Pencil or pen. How to prepare Avoid these things for 30 minutes before checking your blood pressure:  Having drinks with caffeine in them, such as coffee or tea.  Drinking alcohol.  Eating.  Smoking.  Exercising. Do these things five minutes before checking your blood pressure:  Go to the bathroom and pee (urinate).  Sit in a dining chair. Do not sit in a soft couch or an armchair.  Be quiet. Do not talk. How to take your blood pressure Follow the instructions that came with your machine. If you have a digital blood pressure monitor, these may be the instructions: 1. Sit up straight. 2. Place your feet on the floor. Do not cross your ankles or legs. 3. Rest your left arm at the level of your heart. You may rest it on a table, desk,  or chair. 4. Pull up your shirt sleeve. 5. Wrap the blood pressure cuff around the upper part of your left arm. The cuff should be 1 inch (2.5 cm) above your elbow. It is best to wrap the cuff around bare skin. 6. Fit the cuff snugly around your arm. You should be able to place only one finger between the cuff and your arm. 7. Place the cord so that it rests in the bend of your elbow. 8. Press the power  button. 9. Sit quietly while the cuff fills with air and loses air. 10. Write down the numbers on the screen. 11. Wait 2-3 minutes and then repeat steps 1-10.   What do the numbers mean? Two numbers make up your blood pressure. The first number is called systolic pressure. The second is called diastolic pressure. An example of a blood pressure reading is "120 over 80" (or 120/80). If you are an adult and do not have a medical condition, use this guide to find out if your blood pressure is normal: Normal  First number: below 120.  Second number: below 80. Elevated  First number: 120-129.  Second number: below 80. Hypertension stage 1  First number: 130-139.  Second number: 80-89. Hypertension stage 2  First number: 140 or above.  Second number: 54 or above. Your blood pressure is above normal even if only the top or bottom number is above normal. Follow these instructions at home:  Check your blood pressure as often as your doctor tells you to.  Check your blood pressure at the same time every day.  Take your monitor to your next doctor's appointment. Your doctor will: ? Make sure you are using it correctly. ? Make sure it is working right.  Make sure you understand what your blood pressure numbers should be.  Tell your doctor if your medicine is causing side effects.  Keep all follow-up visits as told by your doctor. This is important. General tips:  You will need a blood pressure machine, or monitor. Your doctor can suggest a monitor. You can buy one at a drugstore or online. When choosing one: ? Choose one with an arm cuff. ? Choose one that wraps around your upper arm. Only one finger should fit between your arm and the cuff. ? Do not choose one that measures your blood pressure from your wrist or finger. Where to find more information American Heart Association: www.heart.org Contact a doctor if:  Your blood pressure keeps being high. Get help right away  if:  Your first blood pressure number is higher than 180.  Your second blood pressure number is higher than 120. Summary  Check your blood pressure at the same time every day.  Avoid caffeine, alcohol, smoking, and exercise for 30 minutes before checking your blood pressure.  Make sure you understand what your blood pressure numbers should be. This information is not intended to replace advice given to you by your health care provider. Make sure you discuss any questions you have with your health care provider. Document Revised: 02/05/2019 Document Reviewed: 02/05/2019 Elsevier Patient Education  2021 [employer].

## 2020-03-22 DIAGNOSIS — M9905 Segmental and somatic dysfunction of pelvic region: Secondary | ICD-10-CM | POA: Diagnosis not present

## 2020-03-22 DIAGNOSIS — M9903 Segmental and somatic dysfunction of lumbar region: Secondary | ICD-10-CM | POA: Diagnosis not present

## 2020-03-22 DIAGNOSIS — M9904 Segmental and somatic dysfunction of sacral region: Secondary | ICD-10-CM | POA: Diagnosis not present

## 2020-03-22 DIAGNOSIS — M5136 Other intervertebral disc degeneration, lumbar region: Secondary | ICD-10-CM | POA: Diagnosis not present

## 2020-03-22 MED ORDER — PREDNISONE 10 MG PO TABS: ORAL_TABLET | ORAL | 0 refills | Status: DC

## 2020-03-22 NOTE — Addendum Note (Signed)
Addended by: [name] on: 03/22/2020 07:37 AM   Modules accepted: Orders

## 2020-03-22 NOTE — Progress Notes (Signed)
-  I sent in prednisone taper for him.  I agree that this would be unlikely to trigger episode of Guillain-Barr  -Agree with trial of tapering off PPI.  -Again discussed [facility] screening with him in further detail.  There are quite a few different tests that they offer but most of them are limited clinical value.  For example, they do screening for carotid artery disease which is not recommended by the [place], [place] of cardiology, or [place] preventive task force for asymptomatic individuals.

## 2020-03-27 ENCOUNTER — Other Ambulatory Visit: Payer: Self-pay | Admitting: Family Medicine

## 2020-03-29 ENCOUNTER — Ambulatory Visit
Admission: RE | Admit: 2020-03-29 | Discharge: 2020-03-29 | Disposition: A | Discharge status: Home / Self Care | Payer: Medicare Other | Source: Ambulatory Visit | Attending: Family Medicine | Admitting: Family Medicine

## 2020-03-29 ENCOUNTER — Other Ambulatory Visit: Payer: Self-pay

## 2020-03-29 DIAGNOSIS — M545 Low back pain, unspecified: Secondary | ICD-10-CM | POA: Diagnosis not present

## 2020-03-29 DIAGNOSIS — M5416 Radiculopathy, lumbar region: Secondary | ICD-10-CM

## 2020-03-29 DIAGNOSIS — M48061 Spinal stenosis, lumbar region without neurogenic claudication: Secondary | ICD-10-CM | POA: Diagnosis not present

## 2020-03-30 ENCOUNTER — Telehealth: Payer: Self-pay

## 2020-03-30 DIAGNOSIS — M48061 Spinal stenosis, lumbar region without neurogenic claudication: Secondary | ICD-10-CM

## 2020-03-30 NOTE — Telephone Encounter (Signed)
Informed patient of results.  Referral to neurosurgeon placed.  Patient aware.

## 2020-03-30 NOTE — Telephone Encounter (Signed)
-----   Message from [name], MD sent at 03/30/2020  1:58 PM EST ----- Severe spinal stenosis L4-5 .  I think he already has follow up with neurosurgeon?  We need to confirm.

## 2020-04-04 DIAGNOSIS — M9905 Segmental and somatic dysfunction of pelvic region: Secondary | ICD-10-CM | POA: Diagnosis not present

## 2020-04-04 DIAGNOSIS — M5136 Other intervertebral disc degeneration, lumbar region: Secondary | ICD-10-CM | POA: Diagnosis not present

## 2020-04-04 DIAGNOSIS — M9904 Segmental and somatic dysfunction of sacral region: Secondary | ICD-10-CM | POA: Diagnosis not present

## 2020-04-04 DIAGNOSIS — M9903 Segmental and somatic dysfunction of lumbar region: Secondary | ICD-10-CM | POA: Diagnosis not present

## 2020-04-06 DIAGNOSIS — M5416 Radiculopathy, lumbar region: Secondary | ICD-10-CM | POA: Diagnosis not present

## 2020-04-06 DIAGNOSIS — M4310 Spondylolisthesis, site unspecified: Secondary | ICD-10-CM | POA: Diagnosis not present

## 2020-04-14 ENCOUNTER — Other Ambulatory Visit: Payer: Self-pay | Admitting: Family Medicine

## 2020-04-21 DIAGNOSIS — M4310 Spondylolisthesis, site unspecified: Secondary | ICD-10-CM | POA: Diagnosis not present

## 2020-04-21 DIAGNOSIS — M48061 Spinal stenosis, lumbar region without neurogenic claudication: Secondary | ICD-10-CM | POA: Diagnosis not present

## 2020-04-21 DIAGNOSIS — M5416 Radiculopathy, lumbar region: Secondary | ICD-10-CM | POA: Diagnosis not present

## 2020-04-25 ENCOUNTER — Ambulatory Visit: Payer: Medicare Other | Attending: Neurosurgery | Admitting: Physical Therapy

## 2020-04-25 ENCOUNTER — Encounter: Payer: Self-pay | Admitting: Physical Therapy

## 2020-04-25 ENCOUNTER — Other Ambulatory Visit: Payer: Self-pay

## 2020-04-25 DIAGNOSIS — M25651 Stiffness of right hip, not elsewhere classified: Secondary | ICD-10-CM | POA: Diagnosis not present

## 2020-04-25 DIAGNOSIS — M25652 Stiffness of left hip, not elsewhere classified: Secondary | ICD-10-CM | POA: Insufficient documentation

## 2020-04-25 DIAGNOSIS — M545 Low back pain, unspecified: Secondary | ICD-10-CM | POA: Diagnosis not present

## 2020-04-25 NOTE — Patient Instructions (Signed)
Access Code: [email or web address] URL: [email or web address] Date: 04/25/2020 Prepared by: Almyra Free  Exercises Supine Lower Trunk Rotation - 2 x daily - 7 x weekly - 1 sets - 5 reps - 10 sec hold Supine Single Knee to Chest Stretch - 2 x daily - 7 x weekly - 1 sets - 3 reps - 20 sec hold Supine Piriformis Stretch with Leg Straight - 2 x daily - 7 x weekly - 1 sets - 3 reps - 30 sec hold Supine Piriformis Stretch - 2 x daily - 7 x weekly - 1 sets - 3 reps - 30 hold Standing Hamstring Stretch on Chair - 2 x daily - 7 x weekly - 1 sets - 3 reps - 30 hold

## 2020-04-25 NOTE — Therapy (Signed)
[facility] [address] [place], [place], [address] Phone: [phone]   Fax:  [phone]  Physical Therapy Evaluation  Patient Details  Name: Drew Smith MRN: [id number] Date of Birth: 28-Oct-1940 Referring Provider (PT): Leonie Green NP   Encounter Date: 04/25/2020   PT End of Session - 04/25/20 1102    Visit Number 1    Date for PT Re-Evaluation 06/06/20    Authorization Type UHC MCR    Progress Note Due on Visit 10    PT Start Time 1102    PT Stop Time 1149    PT Time Calculation (min) 47 min    Activity Tolerance Patient tolerated treatment well    Behavior During Therapy [facility] for tasks assessed/performed           Past Medical History:  Diagnosis Date  . Arthritis   . Asthma   . Cancer [facility])    prostate  . GERD (gastroesophageal reflux disease)   . Guillain-Barre syndrome ([place]) 2000  . Hyperlipidemia   . Hypertension     Past Surgical History:  Procedure Laterality Date  . EYE SURGERY  3214   80 years old  . HERNIA REPAIR  1995  . NOSE SURGERY  1990  . PROSTATE SURGERY  2000   seed inplants  . SHOULDER SURGERY  2008    There were no vitals filed for this visit.    Subjective Assessment - 04/25/20 1106    Subjective Patient began having increased back pain in fall of 2021 to where he couldn't get a golf ball out of the hole. Pain in back is minimal now due to prednisone but still feels it in left hip and into LLE intermittently to toes at worst. Sitting is worse than standing. Active and does a lot stretching and exercising, Would like to get back to golf.    Pertinent History prostrate CA/surgery, shoulder surgery.    Limitations Sitting;Other (comment)   bending   How long can you sit comfortably? 30 min to 1 hour at most    Diagnostic tests MRI Severe L4-5 facet arthrosis with grade 1 anterolisthesis and  severe spinal stenosis; Mild spinal stenosis and mild-to-moderate lateral recess  stenosis  at L3-4.    Patient Stated Goals get back to golf    Currently in Pain? Yes    Pain Score 2     Pain Location Back    Pain Orientation Left    Pain Descriptors / Indicators Dull    Pain Type Acute pain    Pain Radiating Towards left LT to toes intermittently    Pain Onset More than a month ago    Pain Frequency Constant    Aggravating Factors  sitting, bending    Pain Relieving Factors prednisone    Effect of Pain on Daily Activities unable to golf              [facility] PT Assessment - 04/25/20 0001      Assessment   Medical Diagnosis spinal stenosis lumbar with neurogenic claudication    Referring Provider (PT) Leonie Green NP    Onset Date/Surgical Date 12/27/19    Hand Dominance Right    Next MD Visit week or two    Prior Therapy no      Precautions   Precaution Comments prostate CA      Restrictions   Weight Bearing Restrictions No      Balance Screen   Has the patient  fallen in the past 6 months No    Has the patient had a decrease in activity level because of a fear of falling?  No    Is the patient reluctant to leave their home because of a fear of falling?  No      Home Environment   Living Environment Private residence    Additional Comments one level home      Prior Function   Level of [place] Retired    Leisure active in yard, stretching      Observation/Other Assessments   Focus on Therapeutic Outcomes (FOTO)  FS = 45; goal 58      Posture/Postural Control   Posture/Postural Control Postural limitations    Postural Limitations Rounded Shoulders;Forward head;Decreased lumbar lordosis;Posterior pelvic tilt;Flexed trunk    Posture Comments left scapula lower      ROM / Strength   AROM / PROM / Strength AROM      AROM   Overall AROM Comments lumbar flex limited by HS, right SB 5 deg. left WFL, rot limited 50% bil, ext 25%      Flexibility   Soft Tissue Assessment /Muscle Length yes    Hamstrings bil tightness  Lt > Rt    Quadriceps marked to less than 90 in prone bil    ITB + ober bil    Piriformis marked bil      Palpation   Palpation comment marked tenderness along bil ITB and at right greater trochanter; left piriformis      Special Tests   Other special tests neg slump and SLR                      Objective measurements completed on examination: See above findings.               PT Education - 04/25/20 1657    Education Details HEP    Person(s) Educated Patient    Methods Explanation;Demonstration;Handout    Comprehension Verbalized understanding;Returned demonstration            PT Short Term Goals - 04/25/20 1711      PT SHORT TERM GOAL #1   Title Pt will be ind and compliant with initial HEP    Time 3    Period Weeks    Status New    Target Date 05/16/20      PT SHORT TERM GOAL #2   Title -      PT SHORT TERM GOAL #3   Title -             PT Long Term Goals - 04/25/20 1711      PT LONG TERM GOAL #1   Title Pt will be ind in advanced HEP for lumbar and LE flexibilty    Time 8    Period Weeks    Status New    Target Date 06/20/20      PT LONG TERM GOAL #2   Title Pt will be able to play a round of golf with decreased pain by >= 60%    Time 8    Period Weeks    Status New      PT LONG TERM GOAL #3   Title Patient to report improved sitting tolerance by 50% or more.    Time 8    Period Weeks    Status New      PT LONG TERM GOAL #4   Title Pt  will reduce FOTO score to 58 or better    Baseline 45    Time 8    Period Weeks    Status New      PT LONG TERM GOAL #5   Title Patient able to demo alternative techniques for retrieving golf ball vs. bending over.    Time 8    Period Weeks    Status New                  Plan - 04/25/20 1658    Clinical Impression Statement Patient presents today with c/o of left low back and LE pain beginning in Nov 2021 when bending over to get his golf ball. His back pain has  resolved with Prednisone, but he still has left hip and leg pain which intermittently goes to his toes. He has marked flexibility deficits in bil HS (<50 deg); quads (prone less than 90 deg knee flex); bil ITB (+ober), and his lumbar spine. He is limited in lumbar flex by his HS, bil rotation is 50% and Rt SB is limtied to 5 deg. His strength is grossly 5/5 in Bil LE. Patient would like to get back to golf. His pain is worse with sitting and he is concerned he will not be able to make an upcoming car trip due to this. He will benefit from PT to address these deficits.    Personal Factors and Comorbidities Comorbidity 2;Comorbidity 3+    Comorbidities anterolisthesis, spinal stenosis, shoulder surgery    Examination-Activity Limitations Bend;Sit    Examination-Participation Restrictions Driving;Other   golf   Stability/Clinical Decision Making Stable/Uncomplicated    Clinical Decision Making Low    Rehab Potential Good    PT Frequency 2x / week   pt may only come 1x/wk   PT Duration 8 weeks    PT Treatment/Interventions ADLs/Self Care Home Management;Electrical Stimulation;Iontophoresis 4mg /ml Dexamethasone;Therapeutic exercise;Therapeutic activities;Neuromuscular re-education;Manual techniques;Patient/family education;Dry needling    PT Next Visit Plan Review HEP and progress flexibility for hips and lumbar; manual to ITBs    PT Home Exercise Plan [email or web address]    Consulted and Agree with Plan of Care Patient           Patient will benefit from skilled therapeutic intervention in order to improve the following deficits and impairments:  Decreased range of motion,Pain,Increased muscle spasms,Decreased activity tolerance,Impaired flexibility,Postural dysfunction,Decreased mobility  Visit Diagnosis: Acute left-sided low back pain, unspecified whether sciatica present  Stiffness of left hip, not elsewhere classified  Stiffness of right hip, not elsewhere classified     Problem List Patient  Active Problem List   Diagnosis Date Noted  . Malignant melanoma of left forearm ([place]) 10/19/2019  . Upper respiratory tract infection 02/14/2017  . Porokeratosis 09/07/2014  . Metatarsal deformity 09/07/2014  . Pain in lower limb 09/07/2014  . Squamous cell carcinoma in situ of skin 10/15/2013  . Hives 10/06/2012  . History of Guillain-Barre syndrome 03/07/2011  . Hypertension 11/28/2010  . Dyslipidemia 11/28/2010  . ERECTILE DYSFUNCTION 01/05/2008  . ASTHMA 10/28/2007  . GERD 10/28/2007  . DIZZINESS 10/28/2007  . COUGH 10/28/2007  . FASTING HYPERGLYCEMIA 10/28/2007  . PROSTATE CANCER, HX OF 10/28/2007    Madelyn Flavors PT 04/25/2020, 5:20 PM  [facility] [address], [place], [place], [address] Phone: [phone]   Fax:  [phone]  Name: Drew Smith MRN: [id number] Date of Birth: 03/02/1940

## 2020-05-09 ENCOUNTER — Telehealth: Payer: Self-pay | Admitting: Family Medicine

## 2020-05-09 DIAGNOSIS — L814 Other melanin hyperpigmentation: Secondary | ICD-10-CM | POA: Diagnosis not present

## 2020-05-09 DIAGNOSIS — D229 Melanocytic nevi, unspecified: Secondary | ICD-10-CM | POA: Diagnosis not present

## 2020-05-09 DIAGNOSIS — Z85820 Personal history of malignant melanoma of skin: Secondary | ICD-10-CM | POA: Diagnosis not present

## 2020-05-09 DIAGNOSIS — L821 Other seborrheic keratosis: Secondary | ICD-10-CM | POA: Diagnosis not present

## 2020-05-09 DIAGNOSIS — Z85828 Personal history of other malignant neoplasm of skin: Secondary | ICD-10-CM | POA: Diagnosis not present

## 2020-05-09 DIAGNOSIS — L905 Scar conditions and fibrosis of skin: Secondary | ICD-10-CM | POA: Diagnosis not present

## 2020-05-09 DIAGNOSIS — D485 Neoplasm of uncertain behavior of skin: Secondary | ICD-10-CM | POA: Diagnosis not present

## 2020-05-09 DIAGNOSIS — L819 Disorder of pigmentation, unspecified: Secondary | ICD-10-CM | POA: Diagnosis not present

## 2020-05-09 NOTE — Telephone Encounter (Signed)
Left message for patient to call back and schedule Medicare Annual Wellness Visit (AWV) either virtually or in office. No detailed message    Last AWV 03/26/19  please schedule at anytime with [facility] Nurse Health Advisor 1 or 2   This should be a 45 minute visit.

## 2020-05-10 ENCOUNTER — Encounter: Payer: Self-pay | Admitting: Physical Therapy

## 2020-05-10 ENCOUNTER — Ambulatory Visit: Payer: Medicare Other | Admitting: Physical Therapy

## 2020-05-10 ENCOUNTER — Other Ambulatory Visit: Payer: Self-pay

## 2020-05-10 DIAGNOSIS — M25652 Stiffness of left hip, not elsewhere classified: Secondary | ICD-10-CM | POA: Diagnosis not present

## 2020-05-10 DIAGNOSIS — M545 Low back pain, unspecified: Secondary | ICD-10-CM

## 2020-05-10 DIAGNOSIS — M25651 Stiffness of right hip, not elsewhere classified: Secondary | ICD-10-CM

## 2020-05-10 NOTE — Therapy (Signed)
[facility] [address], [place], [place], [address] Phone: [phone]   Fax:  [phone]  Physical Therapy Treatment  Patient Details  Name: Drew Smith MRN: [id number] Date of Birth: [birth year] Referring Provider (PT): Leonie Green NP   Encounter Date: 05/10/2020   PT End of Session - 05/10/20 1254    Visit Number 2    Date for PT Re-Evaluation 06/20/20    Authorization Type UHC MCR    Progress Note Due on Visit 10    PT Start Time 1232    PT Stop Time 1310    PT Time Calculation (min) 38 min    Activity Tolerance Patient tolerated treatment well    Behavior During Therapy [facility] for tasks assessed/performed           Past Medical History:  Diagnosis Date  . Arthritis   . Asthma   . Cancer [facility])    prostate  . GERD (gastroesophageal reflux disease)   . Guillain-Barre syndrome ([place]) 2000  . Hyperlipidemia   . Hypertension     Past Surgical History:  Procedure Laterality Date  . EYE SURGERY  6556   80 years old  . HERNIA REPAIR  1995  . NOSE SURGERY  1990  . PROSTATE SURGERY  2000   seed inplants  . SHOULDER SURGERY  2008    There were no vitals filed for this visit.   Subjective Assessment - 05/10/20 1240    Subjective Pt states he is very achy today and it is all over.  Pt states the back is sore on the Rt lower thoracic area is more sore today    Patient Stated Goals get back to golf    Currently in Pain? Yes    Pain Score 4     Pain Location Back    Pain Orientation Right;Mid;Lower    Pain Descriptors / Indicators Dull    Pain Type Acute pain    Pain Onset More than a month ago    Pain Frequency Intermittent    Multiple Pain Sites No                             [facility] Adult PT Treatment/Exercise - 05/10/20 0001      Exercises   Exercises Lumbar      Lumbar Exercises: Stretches   Active Hamstring Stretch Right;Left;3 reps    Single Knee to Chest Stretch  Right;Left    Other Lumbar Stretch Exercise side lying thoracic rotation      Lumbar Exercises: Supine   Bent Knee Raise 20 reps      Modalities   Modalities Moist Heat      Moist Heat Therapy   Number Minutes Moist Heat 20 Minutes    Moist Heat Location Lumbar Spine      Manual Therapy   Manual Therapy Soft tissue mobilization;Joint mobilization    Joint Mobilization hip distraction bilat    Soft tissue mobilization sidelying on Lt side - thoracic and lumbar paraspinals and traction stretch to sacrum                    PT Short Term Goals - 05/10/20 1255      PT SHORT TERM GOAL #1   Title Pt will be ind and compliant with initial HEP    Status Achieved             PT Long  Term Goals - 04/25/20 1711      PT LONG TERM GOAL #1   Title Pt will be ind in advanced HEP for lumbar and LE flexibilty    Time 8    Period Weeks    Status New    Target Date 06/20/20      PT LONG TERM GOAL #2   Title Pt will be able to play a round of golf with decreased pain by >= 60%    Time 8    Period Weeks    Status New      PT LONG TERM GOAL #3   Title Patient to report improved sitting tolerance by 50% or more.    Time 8    Period Weeks    Status New      PT LONG TERM GOAL #4   Title Pt will reduce FOTO score to 58 or better    Baseline 45    Time 8    Period Weeks    Status New      PT LONG TERM GOAL #5   Title Patient able to demo alternative techniques for retrieving golf ball vs. bending over.    Time 8    Period Weeks    Status New                 Plan - 05/10/20 1256    Clinical Impression Statement Pt is ind and compliant with initial HEP.  Pt arrived today with more soreness ("achiness") than he has felt and reports it is all over today.  Pt responded well to moist heat during stretches. Pt was able to add basic core strength today.  Most of session focused on improved mobility due to significant ROM deficits throughout hips and back.  Pt would  like to get back to golfing.    PT Treatment/Interventions ADLs/Self Care Home Management;Electrical Stimulation;Iontophoresis 4mg /ml Dexamethasone;Therapeutic exercise;Therapeutic activities;Neuromuscular re-education;Manual techniques;Patient/family education;Dry needling    PT Next Visit Plan Review HEP and progress flexibility for hips and lumbar; moist heat, manual to ITBs, hip mobs    PT Home Exercise Plan [email or web address]    Consulted and Agree with Plan of Care Patient           Patient will benefit from skilled therapeutic intervention in order to improve the following deficits and impairments:  Decreased range of motion,Pain,Increased muscle spasms,Decreased activity tolerance,Impaired flexibility,Postural dysfunction,Decreased mobility  Visit Diagnosis: Acute left-sided low back pain, unspecified whether sciatica present  Stiffness of left hip, not elsewhere classified  Stiffness of right hip, not elsewhere classified     Problem List Patient Active Problem List   Diagnosis Date Noted  . Malignant melanoma of left forearm ([place]) 10/19/2019  . Upper respiratory tract infection 02/14/2017  . Porokeratosis 09/07/2014  . Metatarsal deformity 09/07/2014  . Pain in lower limb 09/07/2014  . Squamous cell carcinoma in situ of skin 10/15/2013  . Hives 10/06/2012  . History of Guillain-Barre syndrome 03/07/2011  . Hypertension 11/28/2010  . Dyslipidemia 11/28/2010  . ERECTILE DYSFUNCTION 01/05/2008  . ASTHMA 10/28/2007  . GERD 10/28/2007  . DIZZINESS 10/28/2007  . COUGH 10/28/2007  . FASTING HYPERGLYCEMIA 10/28/2007  . PROSTATE CANCER, HX OF 10/28/2007    Jule Ser, PT 05/10/2020, 5:41 PM  [facility] [address], [place], [place], [address] Phone: [phone]   Fax:  [phone]  Name: GABRIAL DOMINE MRN: [id number] Date of Birth: 06-13-1940

## 2020-05-10 NOTE — Telephone Encounter (Signed)
Pt called and was scheduled.

## 2020-05-16 ENCOUNTER — Other Ambulatory Visit: Payer: Self-pay

## 2020-05-16 ENCOUNTER — Ambulatory Visit: Payer: Medicare Other | Admitting: Physical Therapy

## 2020-05-16 VITALS — BP 151/73 | HR 73

## 2020-05-16 DIAGNOSIS — M25651 Stiffness of right hip, not elsewhere classified: Secondary | ICD-10-CM

## 2020-05-16 DIAGNOSIS — M25652 Stiffness of left hip, not elsewhere classified: Secondary | ICD-10-CM

## 2020-05-16 DIAGNOSIS — M545 Low back pain, unspecified: Secondary | ICD-10-CM

## 2020-05-16 NOTE — Therapy (Signed)
[facility] [address], [place], [place], [address] Phone: [phone]   Fax:  [phone]  Physical Therapy Treatment  Patient Details  Name: Drew Smith MRN: [id number] Date of Birth: 06-14-1940 Referring Provider (PT): Leonie Green NP   Encounter Date: 05/16/2020   PT End of Session - 05/16/20 1930    Visit Number 3    Date for PT Re-Evaluation 06/20/20    Authorization Type UHC MCR    Progress Note Due on Visit 10    PT Start Time 1400    PT Stop Time 1442    PT Time Calculation (min) 42 min    Activity Tolerance Patient tolerated treatment well           Past Medical History:  Diagnosis Date  . Arthritis   . Asthma   . Cancer [facility])    prostate  . GERD (gastroesophageal reflux disease)   . Guillain-Barre syndrome ([place]) 2000  . Hyperlipidemia   . Hypertension     Past Surgical History:  Procedure Laterality Date  . EYE SURGERY  3259   80 years old  . HERNIA REPAIR  1995  . NOSE SURGERY  1990  . PROSTATE SURGERY  2000   seed inplants  . SHOULDER SURGERY  2008    Vitals:   05/16/20 1402  BP: (!) 151/73  Pulse: 73     Subjective Assessment - 05/16/20 1353    Subjective I'd like to check my BP.  I've been in a lot of pain lately and that makes my BP higher.  I think the manual therapy helped last time.    Pertinent History prostrate CA/surgery, shoulder surgery.    How long can you sit comfortably? 30 min to 1 hour at most    Diagnostic tests MRI Severe L4-5 facet arthrosis with grade 1 anterolisthesis and  severe spinal stenosis; Mild spinal stenosis and mild-to-moderate lateral recess stenosis  at L3-4.    Patient Stated Goals get back to golf    Currently in Pain? Yes    Pain Score 7     Pain Location Back    Pain Orientation Right    Pain Type Acute pain                             [facility] Adult PT Treatment/Exercise - 05/16/20 0001      Lumbar Exercises:  Stretches   Single Knee to Chest Stretch Right;Left;1 rep;30 seconds    Other Lumbar Stretch Exercise KTC with bias toward opp shoulder 1 rep 30 sec    Other Lumbar Stretch Exercise sidelying open books      Lumbar Exercises: Supine   Bent Knee Raise 10 reps      Moist Heat Therapy   Number Minutes Moist Heat 5 Minutes    Moist Heat Location Lumbar Spine      Manual Therapy   Joint Mobilization hip distraction right; inferior hip mob, AP in internal rotation    Soft tissue mobilization sidelying on Lt side neutral gapping; Addaday instrument assisted to lumbosacral muscles    Kinesiotex Facilitate Muscle      Kinesiotix   Facilitate Muscle  star                    PT Short Term Goals - 05/16/20 1934      PT SHORT TERM GOAL #1   Title Pt will be ind  and compliant with initial HEP    Status Achieved      PT SHORT TERM GOAL #2   Baseline stiffness limits ideal posture but Pt aware of how to achieve his best posture within mobility limits    Status Achieved             PT Long Term Goals - 04/25/20 1711      PT LONG TERM GOAL #1   Title Pt will be ind in advanced HEP for lumbar and LE flexibilty    Time 8    Period Weeks    Status New    Target Date 06/20/20      PT LONG TERM GOAL #2   Title Pt will be able to play a round of golf with decreased pain by >= 60%    Time 8    Period Weeks    Status New      PT LONG TERM GOAL #3   Title Patient to report improved sitting tolerance by 50% or more.    Time 8    Period Weeks    Status New      PT LONG TERM GOAL #4   Title Pt will reduce FOTO score to 58 or better    Baseline 45    Time 8    Period Weeks    Status New      PT LONG TERM GOAL #5   Title Patient able to demo alternative techniques for retrieving golf ball vs. bending over.    Time 8    Period Weeks    Status New                 Plan - 05/16/20 1931    Clinical Impression Statement The patient reports "pain all over" not only  his back but also both shoulders.  He requests a BP check which was elevated both pre/post treatment session.  He has a positive response to hip and lumbar joint mobs and is able to perform low level ex's without an exacerbation of symptoms.  Improved soft tissue and joint mobility post treatment session.  Therapist monitoring response with all treatment interventions.    Comorbidities anterolisthesis, spinal stenosis, shoulder surgery    Rehab Potential Good    PT Frequency 2x / week    PT Duration 8 weeks    PT Treatment/Interventions ADLs/Self Care Home Management;Electrical Stimulation;Iontophoresis 4mg /ml Dexamethasone;Therapeutic exercise;Therapeutic activities;Neuromuscular re-education;Manual techniques;Patient/family education;Dry needling    PT Next Visit Plan Monitor BP;  pt considering DN; assess response to KT;   progress flexibility for hips and lumbar; moist heat, manual to ITBs, hip mobs    PT Home Exercise Plan [email or web address]           Patient will benefit from skilled therapeutic intervention in order to improve the following deficits and impairments:  Decreased range of motion,Pain,Increased muscle spasms,Decreased activity tolerance,Impaired flexibility,Postural dysfunction,Decreased mobility  Visit Diagnosis: Acute left-sided low back pain, unspecified whether sciatica present  Stiffness of left hip, not elsewhere classified  Stiffness of right hip, not elsewhere classified     Problem List Patient Active Problem List   Diagnosis Date Noted  . Malignant melanoma of left forearm ([place]) 10/19/2019  . Upper respiratory tract infection 02/14/2017  . Porokeratosis 09/07/2014  . Metatarsal deformity 09/07/2014  . Pain in lower limb 09/07/2014  . Squamous cell carcinoma in situ of skin 10/15/2013  . Hives 10/06/2012  . History of Guillain-Barre syndrome 03/07/2011  . Hypertension 11/28/2010  .  Dyslipidemia 11/28/2010  . ERECTILE DYSFUNCTION 01/05/2008  . ASTHMA  10/28/2007  . GERD 10/28/2007  . DIZZINESS 10/28/2007  . COUGH 10/28/2007  . FASTING HYPERGLYCEMIA 10/28/2007  . PROSTATE CANCER, HX OF 10/28/2007   [name], PT 05/16/20 7:36 PM Phone: [phone] Fax: [phone] Alvera Singh 05/16/2020, 7:35 PM  [facility] [address], [place], [place], [address] Phone: [phone]   Fax:  [phone]  Name: Drew Smith MRN: [id number] Date of Birth: 11-25-1940

## 2020-05-22 ENCOUNTER — Other Ambulatory Visit: Payer: Self-pay

## 2020-05-22 ENCOUNTER — Ambulatory Visit (INDEPENDENT_AMBULATORY_CARE_PROVIDER_SITE_OTHER): Payer: Medicare Other | Admitting: Family Medicine

## 2020-05-22 ENCOUNTER — Encounter: Payer: Self-pay | Admitting: Family Medicine

## 2020-05-22 VITALS — BP 144/72 | HR 75 | Temp 98.3°F | Wt 162.7 lb

## 2020-05-22 DIAGNOSIS — M9904 Segmental and somatic dysfunction of sacral region: Secondary | ICD-10-CM | POA: Diagnosis not present

## 2020-05-22 DIAGNOSIS — M549 Dorsalgia, unspecified: Secondary | ICD-10-CM

## 2020-05-22 DIAGNOSIS — M9905 Segmental and somatic dysfunction of pelvic region: Secondary | ICD-10-CM | POA: Diagnosis not present

## 2020-05-22 DIAGNOSIS — M25511 Pain in right shoulder: Secondary | ICD-10-CM

## 2020-05-22 DIAGNOSIS — M9903 Segmental and somatic dysfunction of lumbar region: Secondary | ICD-10-CM | POA: Diagnosis not present

## 2020-05-22 DIAGNOSIS — M5136 Other intervertebral disc degeneration, lumbar region: Secondary | ICD-10-CM | POA: Diagnosis not present

## 2020-05-22 LAB — SEDIMENTATION RATE: Sed Rate: 37 mm/hr — ABNORMAL HIGH (ref 0–20)

## 2020-05-22 MED ORDER — METHYLPREDNISOLONE ACETATE 40 MG/ML IJ SUSP
40.0000 mg | Freq: Once | INTRAMUSCULAR | Status: AC
  Administered 2020-05-22: 40 mg via INTRA_ARTICULAR

## 2020-05-22 NOTE — Patient Instructions (Signed)
Rotator Cuff Tendinitis  Rotator cuff tendinitis is inflammation of the tendons in the rotator cuff. Tendons are tough, cord-like bands that connect muscle to bone. The rotator cuff includes all of the muscles and tendons that connect the arm to the shoulder. The rotator cuff holds the head of the humerus, or the upper arm bone, in the cup of the shoulder blade (scapula). This condition can lead to a long-term or chronic tear. The tear may be partial or complete. What are the causes? This condition is usually caused by overusing the rotator cuff. What increases the risk? This condition is more likely to develop in athletes and workers who frequently use their shoulder or reach over their heads. This can include activities such as:  Tennis.  Baseball or softball.  Swimming.  Construction work.  Painting. What are the signs or symptoms? Symptoms of this condition include:  Pain that spreads (radiates) from the shoulder to the upper arm.  Swelling and tenderness in front of the shoulder.  Pain when reaching, pulling, or lifting the arm above the head.  Pain when lowering the arm from above the head.  Minor pain in the shoulder when resting.  Increased pain in the shoulder at night.  Difficulty placing the arm behind the back. How is this diagnosed? This condition is diagnosed with a physical exam and medical history. Tests may also be done, including:  X-rays.  MRI.  Ultrasound.  CT with or without contrast. How is this treated? Treatment for this condition depends on the severity of the condition. In less severe cases, treatment may include:  Rest. This may be done with a sling that holds the shoulder still (immobilization). Your health care provider may also recommend avoiding activities that involve lifting your arm over your head.  Icing the shoulder.  Anti-inflammatory medicines, such as aspirin or ibuprofen. In more severe cases, treatment may  include:  Physical therapy.  Steroid injections.  Surgery. Follow these instructions at home: If you have a sling:  Wear the sling as told by your health care provider. Remove it only as told by your health care provider.  Loosen it if your fingers tingle, become numb, or turn cold and blue.  Keep it clean.  If the sling is not waterproof: ? Do not let it get wet. ? Cover it with a watertight covering when you take a bath or shower. Managing pain, stiffness, and swelling  If directed, put ice on the injured area. To do this: ? If you have a removable sling, remove it as told by your health care provider. ? Put ice in a plastic bag. ? Place a towel between your skin and the bag. ? Leave the ice on for 20 minutes, 2-3 times a day.  Move your fingers often to reduce stiffness and swelling.  Raise (elevate) the injured area above the level of your heart while you are lying down.  Find a comfortable sleeping position, or sleep in a recliner, if available.   Activity  Rest your shoulder as told by your health care provider.  Ask your health care provider when it is safe to drive if you have a sling on your arm.  Return to your normal activities as told by your health care provider. Ask your health care provider what activities are safe for you.  Do any exercises or stretches as told by your health care provider or physical therapist.  If you do repetitive overhead tasks, take small breaks in between and include   stretching exercises as told by your health care provider. General instructions  Do not use any products that contain nicotine or tobacco, such as cigarettes, e-cigarettes, and chewing tobacco. These can delay healing. If you need help quitting, ask your health care provider.  Take over-the-counter and prescription medicines only as told by your health care provider.  Keep all follow-up visits as told by your health care provider. This is important. Contact a health  care provider if:  Your pain gets worse.  You have new pain in your arm, hands, or fingers.  Your pain is not relieved with medicine or does not get better after 6 weeks of treatment.  You have crackling sensations when moving your shoulder in certain directions.  You hear a snapping sound after using your shoulder, followed by severe pain and weakness. Get help right away if:  Your arm, hand, or fingers are numb or tingling.  Your arm, hand, or fingers are swollen or painful or they turn white or blue. Summary  Rotator cuff tendinitis is inflammation of the tendons in the rotator cuff. Tendons are tough, cord-like bands that connect muscle to bone.  This condition is usually caused by overusing the rotator cuff, which includes all of the muscles and tendons that connect the arm to the shoulder.  This condition is more likely to develop in athletes and workers who frequently use their shoulder or reach over their heads.  Treatment generally includes rest, anti-inflammatory medicines, and icing. In some cases, physical therapy and steroid injections may be needed. In severe cases, surgery may be needed. This information is not intended to replace advice given to you by your health care provider. Make sure you discuss any questions you have with your health care provider. Document Revised: 11/16/2018 Document Reviewed: 11/16/2018 Elsevier Patient Education  2021 Elsevier Inc.  

## 2020-05-22 NOTE — Progress Notes (Signed)
Established Patient Office Visit  Subjective:  Patient ID: Drew Smith, male    DOB: 02-Oct-1940  Age: 80 y.o. MRN: [id number]  CC:  Chief Complaint  Patient presents with  . Shoulder Pain    Woke up this morning with extreme pain in the R shoulder, pt states the L shoulder hurts as well but not as bad, pt also stated his eyes were glassy when he woke up this morning    HPI Drew Smith presents for right shoulder pain which has been progressive over several weeks.  Has had some progressive issues with low back pain and is followed by neurosurgeon.  Currently getting some physical therapy and dry needling.  There was consideration for injection therapy.  Regarding his right shoulder he states that he has pain especially at night when rolling over in sleep.  Denies any specific injury.  No cervical neck pain.  Denies upper extremity weakness.  He does describe some diffuse upper back and neck pains and fatigue.  No headaches.  No acute visual changes.  Sed rate back in December was 30.  Some upper body stiffness.  No fever.   Has lumbar stenosis and was recently prescribed prednisone.   He has been taking that every other day and states he notes some improvement in upper back pain within 2 hours of taking.   Past Medical History:  Diagnosis Date  . Arthritis   . Asthma   . Cancer [facility])    prostate  . GERD (gastroesophageal reflux disease)   . Guillain-Barre syndrome ([place]) 2000  . Hyperlipidemia   . Hypertension     Past Surgical History:  Procedure Laterality Date  . EYE SURGERY  7271   80 years old  . HERNIA REPAIR  1995  . NOSE SURGERY  1990  . PROSTATE SURGERY  2000   seed inplants  . SHOULDER SURGERY  2008    Family History  Problem Relation Age of Onset  . Hyperlipidemia Brother   . Stroke Brother   . Diabetes Brother        type ll  . Alcohol abuse Father     Social History   Socioeconomic History  . Marital status: Divorced    Spouse name: Not  on file  . Number of children: 3  . Years of education: Not on file  . Highest education level: Some college, no degree  Occupational History  . Occupation: retired  Tobacco Use  . Smoking status: Never Smoker  . Smokeless tobacco: Never Used  Vaping Use  . Vaping Use: Never used  Substance and Sexual Activity  . Alcohol use: Yes    Alcohol/week: 7.0 standard drinks    Types: 7 Glasses of wine per week    Comment: have 1/2 a day; wine or other   . Drug use: No  . Sexual activity: Not Currently  Other Topics Concern  . Not on file  Social History Narrative   Lives alone in one level. Has two sons 1 daughter and friends who serve as support.   Former Chiropodist; Enjoys Data processing manager, reading   Originally from [place]   Social Determinants of Health   Financial Resource Strain: [place]   . Difficulty of Paying Living Expenses: Not hard at all  Food Insecurity: Not on file  Transportation Needs: No Transportation Needs  . Lack of Transportation (Medical): No  . Lack of Transportation (Non-Medical): No  Physical Activity: Not on file  Stress: Not on  file  Social Connections: Not on file  Intimate Partner Violence: Not on file    Outpatient Medications Prior to Visit  Medication Sig Dispense Refill  . acyclovir (ZOVIRAX) 400 MG tablet TAKE 1 TABLET (400 MG TOTAL) BY MOUTH AS NEEDED. 90 tablet 2  . amLODipine (NORVASC) 5 MG tablet Take 1 tablet (5 mg total) by mouth daily. 90 tablet 3  . azelastine (ASTELIN) 0.1 % nasal spray Place 1 spray into both nostrils 2 (two) times daily. Use in each nostril as directed 30 mL 12  . ciclopirox (LOPROX) 0.77 % cream APPLY TO AFFECTED AREA(S) TWO TIMES A DAY AS DIRECTED 15 g 0  . desonide (DESOWEN) 0.05 % cream Apply 1 application topically 2 (two) times daily as needed.    . fluorouracil (EFUDEX) 5 % cream Apply topically as needed. Per Dr Joaquim Lai    . gabapentin (NEURONTIN) 100 MG capsule Take one to two capsules by mouth at night as  needed for back pain. 60 capsule 3  . hydrochlorothiazide (MICROZIDE) 12.5 MG capsule Take 1 capsule (12.5 mg total) by mouth daily. 90 capsule 3  . ibuprofen (ADVIL) 800 MG tablet Take 1 tablet (800 mg total) by mouth every 8 (eight) hours as needed. 30 tablet 0  . ibuprofen (ADVIL,MOTRIN) 600 MG tablet Only use 800 mg  8 hours sparingly. (Patient taking differently: Take 600 mg by mouth every 6 (six) hours as needed. Only use 800 mg  8 hours sparingly.) 60 tablet 0  . levocetirizine (XYZAL) 5 MG tablet TAKE ONE TABLET BY MOUTH EVERY EVENING 90 tablet 0  . LORazepam (ATIVAN) 0.5 MG tablet Take one to two tablets one hour prior to procedure 2 tablet 0  . Multiple Vitamins-Minerals (CENTRUM SILVER 50+MEN PO) Take 1 tablet by mouth daily as needed.    [name] omeprazole (PRILOSEC) 20 MG capsule TAKE ONE CAPSULE BY MOUTH TWICE A DAY 180 capsule 0  . Oxymetazoline HCl (NASAL SPRAY) 0.05 % SOLN Place into the nose.    . predniSONE (DELTASONE) 10 MG tablet TAPER DAILY AS FOLLOWS (4-4-4-3-3-2-2-1-1) 24 tablet 0  . traMADol (ULTRAM) 50 MG tablet Take 50 mg by mouth every 6 (six) hours as needed.    . tretinoin (RETIN-A) 0.05 % cream     . triamcinolone cream (KENALOG) 0.1 % APPLY TO AFFECTED AREA(S) TWO TIMES A DAY AS DIRECTED 30 g 0  . VITAMIN D, CHOLECALCIFEROL, PO Take 1 tablet by mouth daily as needed.    . zinc gluconate 50 MG tablet Take 50 mg by mouth daily as needed.     No facility-administered medications prior to visit.    No Known Allergies  ROS Review of Systems  Constitutional: Positive for fatigue. Negative for chills and fever.  Respiratory: Negative for cough and shortness of breath.   Cardiovascular: Negative for chest pain.  Gastrointestinal: Negative for abdominal pain.  Genitourinary: Negative for dysuria.  Musculoskeletal: Positive for back pain.  Skin: Negative for rash.  Neurological: Negative for headaches.  Psychiatric/Behavioral: Negative for confusion.      Objective:     Physical Exam Vitals reviewed.  Constitutional:      Appearance: Normal appearance.  Eyes:     Pupils: Pupils are equal, round, and reactive to light.  Cardiovascular:     Rate and Rhythm: Normal rate and regular rhythm.  Pulmonary:     Effort: Pulmonary effort is normal.     Breath sounds: Normal breath sounds.  Musculoskeletal:     Cervical back:  Neck supple.     Comments: Right shoulder- mild subacromial tenderness.   Good ROM but significant pain with abduction and slight with internal rotation.    Lymphadenopathy:     Cervical: No cervical adenopathy.  Neurological:     General: No focal deficit present.     Mental Status: He is alert.     BP (!) 144/72 (BP Location: Left Arm, Patient Position: Sitting, Cuff Size: Normal)   Pulse 75   Temp 98.3 F (36.8 C) (Oral)   Wt 162 lb 11.2 oz (73.8 kg)   SpO2 97%   BMI 24.03 kg/m  Wt Readings from Last 3 Encounters:  05/22/20 162 lb 11.2 oz (73.8 kg)  03/07/20 164 lb (74.4 kg)  02/12/20 168 lb (76.2 kg)     Health Maintenance Due  Topic Date Due  . COVID-19 Vaccine (1) Never done    There are no preventive care reminders to display for this patient.  Lab Results  Component Value Date   TSH 2.96 04/05/2019   Lab Results  Component Value Date   WBC 7.9 02/09/2020   HGB 13.8 02/09/2020   HCT 40.4 02/09/2020   MCV 93.0 02/09/2020   PLT 231.0 02/09/2020   Lab Results  Component Value Date   NA 136 02/09/2020   K 4.1 02/09/2020   CO2 28 02/09/2020   GLUCOSE 146 (H) 02/09/2020   BUN 15 02/09/2020   CREATININE 0.89 02/09/2020   BILITOT 0.6 02/09/2020   ALKPHOS 70 02/09/2020   AST 12 02/09/2020   ALT 11 02/09/2020   PROT 6.5 02/09/2020   ALBUMIN 3.8 02/09/2020   CALCIUM 9.0 02/09/2020   GFR 80.97 02/09/2020   Lab Results  Component Value Date   CHOL 163 04/05/2019   Lab Results  Component Value Date   HDL 47.60 04/05/2019   Lab Results  Component Value Date   LDLCALC 95 04/05/2019   Lab  Results  Component Value Date   TRIG 103.0 04/05/2019   Lab Results  Component Value Date   CHOLHDL 3 04/05/2019   Lab Results  Component Value Date   HGBA1C 5.4 12/08/2015      Assessment & Plan:   Problem List Items Addressed This Visit   None   Visit Diagnoses    Right shoulder pain, unspecified chronicity    -  Primary   Relevant Medications   methylPREDNISolone acetate (DEPO-MEDROL) injection 40 mg (Completed) (Start on 05/22/2020  3:00 PM)   Upper back pain       Relevant Medications   methylPREDNISolone acetate (DEPO-MEDROL) injection 40 mg (Completed) (Start on 05/22/2020  3:00 PM)   Other Relevant Orders   Sedimentation rate    Right shoulder pain with worsening with internal rotation and abduction against resistance.  Suspect at least some component of rotator cuff tendinitis   -Discussed risks and benefits of corticosteroid injection and patient consented.  After prepping skin with betadine, injected 40 mg depomedrol and 2 cc of plain xylocaine with 25 gauge one and one half inch needle using posterior lateral approach and pt tolerated well. -Discussed gentle range of motion to avoid adhesive capsulitis -He did note some improvement following injection with the Lidocaine -Touch base if injection not working over the next couple weeks  He also seems to have a more generalized upper back and neck pain and general malaise.  ?PMR    He has sporadically taken prednisone which helps. -Repeat sed rate   Meds ordered this encounter  Medications  .  methylPREDNISolone acetate (DEPO-MEDROL) injection 40 mg    Follow-up: No follow-ups on file.    [name], MD

## 2020-05-23 ENCOUNTER — Encounter: Payer: Medicare Other | Admitting: Physical Therapy

## 2020-05-24 ENCOUNTER — Other Ambulatory Visit: Payer: Self-pay

## 2020-05-24 MED ORDER — PREDNISONE 20 MG PO TABS: 20.0000 mg | ORAL_TABLET | Freq: Every day | ORAL | 0 refills | Status: DC

## 2020-05-26 ENCOUNTER — Ambulatory Visit: Payer: Medicare Other

## 2020-05-30 ENCOUNTER — Ambulatory Visit: Payer: Medicare Other | Attending: Neurosurgery | Admitting: Physical Therapy

## 2020-05-30 ENCOUNTER — Other Ambulatory Visit: Payer: Self-pay

## 2020-05-30 VITALS — BP 149/76

## 2020-05-30 DIAGNOSIS — M25652 Stiffness of left hip, not elsewhere classified: Secondary | ICD-10-CM | POA: Insufficient documentation

## 2020-05-30 DIAGNOSIS — M545 Low back pain, unspecified: Secondary | ICD-10-CM | POA: Diagnosis not present

## 2020-05-30 DIAGNOSIS — M25651 Stiffness of right hip, not elsewhere classified: Secondary | ICD-10-CM
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
## 2020-05-31 ENCOUNTER — Encounter: Payer: Self-pay | Admitting: Family Medicine

## 2020-05-31 ENCOUNTER — Ambulatory Visit (INDEPENDENT_AMBULATORY_CARE_PROVIDER_SITE_OTHER): Payer: Medicare Other | Admitting: Family Medicine

## 2020-05-31 ENCOUNTER — Ambulatory Visit: Payer: Medicare Other | Admitting: Family Medicine

## 2020-05-31 VITALS — BP 142/70 | HR 74 | Temp 98.2°F | Wt 162.5 lb

## 2020-05-31 DIAGNOSIS — M25511 Pain in right shoulder: Secondary | ICD-10-CM

## 2020-05-31 DIAGNOSIS — M549 Dorsalgia, unspecified: Secondary | ICD-10-CM | POA: Diagnosis not present

## 2020-05-31 DIAGNOSIS — H579 Unspecified disorder of eye and adnexa: Secondary | ICD-10-CM

## 2020-05-31 MED ORDER — PREDNISONE 10 MG PO TABS: ORAL_TABLET | ORAL | 0 refills | Status: DC

## 2020-05-31 NOTE — Progress Notes (Signed)
Subjective:   Drew Smith is a 80 y.o. male who presents for Medicare Annual/Subsequent preventive examination.  Review of Systems    n/a Cardiac Risk Factors include: advanced age (>23men, >16 women);hypertension;male gender     Objective:    Today's Vitals   06/01/20 1453  BP: 138/62  Pulse: 70  Temp: 98.2 F (36.8 C)  SpO2: 98%  Weight: 162 lb (73.5 kg)  Height: 5\' 8"  (1.727 m)   Body mass index is 24.63 kg/m.  Advanced Directives 04/25/2020 04/30/2019 03/20/2018 03/19/2017  Does Patient Have a Medical Advance Directive? Yes Yes No;Yes No  Type of Paramedic of [place];Living will Living will;Healthcare Power of [place];Living will -  Does patient want to make changes to medical advance directive? No - Patient declined No - Patient declined Yes (MAU/Ambulatory/Procedural Areas - Information given) -  Copy of [place] in Chart? No - copy requested No - copy requested No - copy requested -  Would patient like information on creating a medical advance directive? No - Patient declined - - -    Current Medications (verified) Outpatient Encounter Medications as of 06/01/2020  Medication Sig  . acyclovir (ZOVIRAX) 400 MG tablet TAKE 1 TABLET (400 MG TOTAL) BY MOUTH AS NEEDED.  [name] amLODipine (NORVASC) 5 MG tablet Take 1 tablet (5 mg total) by mouth daily.  [name] azelastine (ASTELIN) 0.1 % nasal spray Place 1 spray into both nostrils 2 (two) times daily. Use in each nostril as directed  . ciclopirox (LOPROX) 0.77 % cream APPLY TO AFFECTED AREA(S) TWO TIMES A DAY AS DIRECTED  . desonide (DESOWEN) 0.05 % cream Apply 1 application topically 2 (two) times daily as needed.  . gabapentin (NEURONTIN) 100 MG capsule Take one to two capsules by mouth at night as needed for back pain.  . hydrochlorothiazide (MICROZIDE) 12.5 MG capsule Take 1 capsule (12.5 mg total) by mouth daily.  [name] ibuprofen (ADVIL) 800 MG tablet Take  1 tablet (800 mg total) by mouth every 8 (eight) hours as needed.  [name] ibuprofen (ADVIL,MOTRIN) 600 MG tablet Only use 800 mg  8 hours sparingly. (Patient taking differently: Take 600 mg by mouth every 6 (six) hours as needed. Only use 800 mg  8 hours sparingly.)  . levocetirizine (XYZAL) 5 MG tablet TAKE ONE TABLET BY MOUTH EVERY EVENING  . Multiple Vitamins-Minerals (CENTRUM SILVER 50+MEN PO) Take 1 tablet by mouth daily as needed.  [name] omeprazole (PRILOSEC) 20 MG capsule TAKE ONE CAPSULE BY MOUTH TWICE A DAY  . Oxymetazoline HCl (NASAL SPRAY) 0.05 % SOLN Place into the nose.  . predniSONE (DELTASONE) 10 MG tablet Take one and one half tablets by mouth once daily for 5 days then one tablet by mouth daily for 3 days then one half tablet by mouth once daily for 3 days and then discontinue.  . predniSONE (DELTASONE) 20 MG tablet Take 1 tablet (20 mg total) by mouth daily with breakfast.  . traMADol (ULTRAM) 50 MG tablet Take 50 mg by mouth every 6 (six) hours as needed.  . tretinoin (RETIN-A) 0.05 % cream   . triamcinolone cream (KENALOG) 0.1 % APPLY TO AFFECTED AREA(S) TWO TIMES A DAY AS DIRECTED  . VITAMIN D, CHOLECALCIFEROL, PO Take 1 tablet by mouth daily as needed.  . zinc gluconate 50 MG tablet Take 50 mg by mouth daily as needed.  . fluorouracil (EFUDEX) 5 % cream Apply topically as needed. Per Dr Joaquim Lai (Patient  not taking: Reported on 06/01/2020)  . LORazepam (ATIVAN) 0.5 MG tablet Take one to two tablets one hour prior to procedure (Patient not taking: Reported on 06/01/2020)   No facility-administered encounter medications on file as of 06/01/2020.    Allergies (verified) Patient has no known allergies.   History: Past Medical History:  Diagnosis Date  . Arthritis   . Asthma   . Cancer [facility])    prostate  . GERD (gastroesophageal reflux disease)   . Guillain-Barre syndrome ([place]) 2000  . Hyperlipidemia   . Hypertension    Past Surgical History:  Procedure Laterality Date  . EYE  SURGERY  124   80 years old  . HERNIA REPAIR  1995  . NOSE SURGERY  1990  . PROSTATE SURGERY  2000   seed inplants  . SHOULDER SURGERY  2008   Family History  Problem Relation Age of Onset  . Hyperlipidemia Brother   . Stroke Brother   . Diabetes Brother        type ll  . Alcohol abuse Father    Social History   Socioeconomic History  . Marital status: Divorced    Spouse name: Not on file  . Number of children: 3  . Years of education: Not on file  . Highest education level: Some college, no degree  Occupational History  . Occupation: retired  Tobacco Use  . Smoking status: Never Smoker  . Smokeless tobacco: Never Used  Vaping Use  . Vaping Use: Never used  Substance and Sexual Activity  . Alcohol use: Yes    Alcohol/week: 7.0 standard drinks    Types: 7 Glasses of wine per week    Comment: have 1/2 a day; wine or other   . Drug use: No  . Sexual activity: Not Currently  Other Topics Concern  . Not on file  Social History Narrative   Lives alone in one level. Has two sons 1 daughter and friends who serve as support.   Former Chiropodist; Enjoys Data processing manager, reading   Originally from [place]   Social Determinants of Health   Financial Resource Strain: [place]   . Difficulty of Paying Living Expenses: Not hard at all  Food Insecurity: No Food Insecurity  . Worried About Charity fundraiser in the Last Year: Never true  . Ran Out of Food in the Last Year: Never true  Transportation Needs: No Transportation Needs  . Lack of Transportation (Medical): No  . Lack of Transportation (Non-Medical): No  Physical Activity: Sufficiently Active  . Days of Exercise per Week: 5 days  . Minutes of Exercise per Session: 60 min  Stress: No Stress Concern Present  . Feeling of Stress : Not at all  Social Connections: Moderately Integrated  . Frequency of Communication with Friends and Family: More than three times a week  . Frequency of Social Gatherings with Friends and  Family: More than three times a week  . Attends Religious Services: 1 to 4 times per year  . Active Member of Clubs or Organizations: Yes  . Attends Archivist Meetings: More than 4 times per year  . Marital Status: Divorced    Tobacco Counseling Counseling given: Not Answered   Clinical Intake:  Pre-visit preparation completed: Yes  Pain : No/denies pain     Nutritional Risks: None Diabetes: No  How often do you need to have someone help you when you read instructions, pamphlets, or other written materials from your doctor or pharmacy?:  1 - Never What is the last grade level you completed in school?: college  Diabetic?no  Interpreter Needed?: No  Information entered by :: [place] of Daily Living In your present state of health, do you have any difficulty performing the following activities: 06/01/2020  Hearing? N  Difficulty concentrating or making decisions? N  Walking or climbing stairs? N  Dressing or bathing? N  Doing errands, shopping? N  Preparing Food and eating ? N  Using the Toilet? N  In the past six months, have you accidently leaked urine? N  Do you have problems with loss of bowel control? N  Managing your Medications? N  Managing your Finances? N  Housekeeping or managing your Housekeeping? N  Some recent data might be hidden    Patient Care Team: [name], MD as PCP - General (Family Medicine) Stephannie Li, [place] (Ophthalmology) Viona Gilmore, [facility] as Pharmacist (Pharmacist)  Indicate any recent Medical Services you may have received from other than [facility] providers in the past year (date may be approximate).     Assessment:   This is a routine wellness examination for Tyson.  Hearing/Vision screen  Hearing Screening   125Hz  250Hz  500Hz  1000Hz  2000Hz  3000Hz  4000Hz  6000Hz  8000Hz   Right ear:           Left ear:           Vision Screening Comments: Annual eye exam wears glasses  Dietary issues and  exercise activities discussed: Current Exercise Habits: Home exercise routine, Type of exercise: walking, Time (Minutes): 60, Frequency (Times/Week): 5, Weekly Exercise (Minutes/Week): 300, Intensity: Mild, Exercise limited by: None identified  Goals    . Exercise 3x per week (30 min per time)    . Patient Stated     Continue to stay healthy and visit [place] when pandemic settles    . Patient Stated     Resume dancing when pandemic settles     . Pharmacy care plan     CARE PLAN ENTRY (see longitudinal plan of care for additional care plan information)  Current Barriers:  . Chronic Disease Management support, education, and care coordination needs related to Hypertension, GERD, and pain   Hypertension BP Readings from Last 3 Encounters:  03/16/20 (!) 146/70  03/07/20 (!) 152/70  02/12/20 134/69   . Pharmacist Clinical Goal(s): o Over the next 90 days, patient will work with PharmD and providers to achieve BP goal <140/90 . Current regimen:  . Amlodipine 5 mg 1 tablet daily . Hydrochlorothiazide 12.5 mg 1 capsule daily . Interventions: o Discussed DASH eating plan recommendations: . Emphasizes vegetables, fruits, and whole-grains . Includes fat-free or low-fat dairy products, fish, poultry, beans, nuts, and vegetable oils . Limits foods that are high in saturated fat. These foods include fatty meats, full-fat dairy products, and tropical oils such as coconut, palm kernel, and palm oils. . Limits sugar-sweetened beverages and sweets . Limiting sodium intake to < 1500 mg/day o Discussed the importance of monitoring blood pressure at home while taking blood pressure medications . Patient self care activities - Over the next 90 days, patient will: o Check blood pressure a couple times weekly, document, and provide at future appointments o Ensure daily salt intake < 2300 mg/day  GERD  . Pharmacist Clinical Goal(s): o Over the next 90 days, patient will work with PharmD and  providers to manage symptoms of heartburn or acid reflux . Current regimen:  o Omeprazole 20 mg 1 capsule once daily .  Interventions: o Discussed non-pharmacologic management of symptoms such as elevating the head of your bed, avoiding eating 2-3 hours before bed, avoiding triggering foods such as acidic, spicy, or fatty foods, eating smaller meals, and wearing clothes that are loose around the waist o Discussed long term risks of taking omeprazole such as increased risk for falls, fractures and vitamin deficiencies . Patient self care activities - Over the next 90 days, patient will: o Continue current medication  Pain . Pharmacist Clinical Goal(s): o Over the next 90 days, patient will work with PharmD and providers to manage symptoms of pain . Current regimen:  . Gabapentin 100 mg 1-2 capsules at night as needed  . Ibuprofen 800 mg 1 tablet as needed  . Tylenol 500 mg 1 tablet in AM in and 1 tablet in PM  . Aleve 220 mg 1 tablet daily as needed  . Tramadol 50 mg 1 tablet PRN . Interventions: o Discussed not taking any Darovcet as this was recalled several years ago due to the risk for heart disease o Recommended avoiding taking both Aleve and ibuprofen as they can cause stomach bleeding and damage your kidneys o Discussed maximum recommended dose of 3,000 mg per day of Tylenol . Patient self care activities - Over the next 90 days, patient will: o Start taking gabapentin for pain o Avoid using both Aleve and ibuprofen for pain  Medication management . Pharmacist Clinical Goal(s): o Over the next 90 days, patient will work with PharmD and providers to maintain optimal medication adherence . [facility] . Interventions o Comprehensive medication review performed. o Continue current medication management strategy . Patient self care activities - Over the next 90 days, patient will: o Take medications as prescribed o Report any questions or concerns to PharmD  and/or provider(s)  Initial goal documentation       Depression Screen PHQ 2/9 Scores 06/01/2020 03/26/2019 03/31/2018 03/20/2018 03/19/2017 03/19/2017 06/27/2015  PHQ - 2 Score 0 0 0 2 0 0 0  PHQ- 9 Score - - 0 3 - - -    Fall Risk Fall Risk  06/01/2020 03/26/2019 03/20/2018 03/19/2017 03/19/2017  Falls in the past year? 0 0 0 No No  Number falls in past yr: 0 - 0 - -  Injury with Fall? 0 - - - -  Risk for fall due to : - Medication side effect - - -  Follow up Falls evaluation completed Falls evaluation completed;Education provided;Falls prevention discussed Education provided - -    FALL RISK PREVENTION PERTAINING TO THE HOME:  Any stairs in or around the home? No  If so, are there any without handrails? Yes  Home free of loose throw rugs in walkways, pet beds, electrical cords, etc? Yes  Adequate lighting in your home to reduce risk of falls? Yes   ASSISTIVE DEVICES UTILIZED TO PREVENT FALLS:  Life alert? No  Use of a cane, walker or w/c? No  Grab bars in the bathroom? Yes  Shower chair or bench in shower? No  Elevated toilet seat or a handicapped toilet? No   TIMED UP AND GO:  Was the test performed? Yes .  Length of time to ambulate 10 feet: 8 sec.   Gait steady and fast without use of assistive device  Cognitive Function: MMSE - Mini Mental State Exam 03/20/2018  Orientation to time 5  Orientation to Place 5  Registration 3  Attention/ Calculation 5  Recall 3  Language- name 2 objects 2  Language- repeat 1  Language- follow 3 step command 3  Language- read & follow direction 1  Write a sentence 1  Copy design 1  Total score 30     6CIT Screen 03/26/2019  What Year? 0 points  What month? 0 points  What time? 0 points  Count back from 20 0 points  Months in reverse 0 points  Repeat phrase 0 points  Total Score 0    Immunizations Immunization History  Administered Date(s) Administered  . Pneumococcal Polysaccharide-23 11/28/2010    TDAP status: Due,  Education has been provided regarding the importance of this vaccine. Advised may receive this vaccine at local pharmacy or Health Dept. Aware to provide a copy of the vaccination record if obtained from local pharmacy or Health Dept. Verbalized acceptance and understanding.  Flu Vaccine status: Up to date  Pneumococcal vaccine status: Completed during today's visit.  Covid-19 vaccine status: Declined, Education has been provided regarding the importance of this vaccine but patient still declined. Advised may receive this vaccine at local pharmacy or Health Dept.or vaccine clinic. Aware to provide a copy of the vaccination record if obtained from local pharmacy or Health Dept. Verbalized acceptance and understanding.  Qualifies for Shingles Vaccine? Yes   Zostavax completed No   Shingrix Completed?: No.    Education has been provided regarding the importance of this vaccine. Patient has been advised to call insurance company to determine out of pocket expense if they have not yet received this vaccine. Advised may also receive vaccine at local pharmacy or Health Dept. Verbalized acceptance and understanding.  Screening Tests Health Maintenance  Topic Date Due  . COVID-19 Vaccine (1) Never done  . TETANUS/TDAP  03/04/2040 (Originally 08/29/1959)  . HPV VACCINES  Aged Out  . INFLUENZA VACCINE  Discontinued    Health Maintenance  Health Maintenance Due  Topic Date Due  . COVID-19 Vaccine (1) Never done    Colorectal cancer screening: No longer required.   Lung Cancer Screening: (Low Dose CT Chest recommended if Age 26-80 years, 30 pack-year currently smoking OR have quit w/in 15years.) does not qualify.   Lung Cancer Screening Referral: n/a  Additional Screening:  Hepatitis C Screening: does not qualify  Vision Screening: Recommended annual ophthalmology exams for early detection of glaucoma and other disorders of the eye. Is the patient up to date with their annual eye exam?  Yes   Who is the provider or what is the name of the office in which the patient attends annual eye exams? Dr.Koop If pt is not established with a provider, would they like to be referred to a provider to establish care? No .   Dental Screening: Recommended annual dental exams for proper oral hygiene  Community Resource Referral / Chronic Care Management: CRR required this visit?  No   CCM required this visit?  No      Plan:     I have personally reviewed and noted the following in the patient's chart:   . Medical and social history . Use of alcohol, tobacco or illicit drugs  . Current medications and supplements . Functional ability and status . Nutritional status . Physical activity . Advanced directives . List of other physicians . Hospitalizations, surgeries, and ER visits in previous 12 months . Vitals . Screenings to include cognitive, depression, and falls . Referrals and appointments  In addition, I have reviewed and discussed with patient certain preventive protocols, quality metrics, and best practice recommendations. A written personalized care plan  for preventive services as well as general preventive health recommendations were provided to patient.     [name], LPN   06/02/3473   Nurse Notes: none

## 2020-05-31 NOTE — Patient Instructions (Signed)
Taper the prednisone as per the prescription  Please give me some feedback in two weeks if back, neck and shoulder symptoms return after coming off the prednisone.

## 2020-05-31 NOTE — Progress Notes (Signed)
Established Patient Office Visit  Subjective:  Patient ID: Drew Smith, male    DOB: 1940-10-16  Age: 80 y.o. MRN: [id number]  CC:  Chief Complaint  Patient presents with  . Follow-up    Shoulder pain L eye, seeing flashing lights, of and on    HPI Bryer V Chopin presents for the following issues.  He states he has seen sensation intermittently for over 6 months now of "flashing lights" most involving the left eye.  He already has pending appointment with ophthalmology.  He has seen ophthalmologist in the past and has known cataracts in both eyes.  Denies any blurred vision.  No symptoms currently.  He has had low back pain with known lumbar stenosis.  Followed by neurosurgeon.  He had some physical therapy and dry needling and feels his low back has improved some.  He was seen last week with at least 4 to 84-month history of some malaise and fatigue and also complained of some diffuse upper back and neck pains.  No fever.  Some upper body stiffness.  Sed rate was 37.  We did consider possible polymyalgia rheumatica given the fact that had several months of systemic like complaints with upper back and neck symptoms as above.  We placed him on low-dose prednisone 20 mg daily and he states his symptoms were significantly improved though not 100% improved.    Past Medical History:  Diagnosis Date  . Arthritis   . Asthma   . Cancer [facility])    prostate  . GERD (gastroesophageal reflux disease)   . Guillain-Barre syndrome ([place]) 2000  . Hyperlipidemia   . Hypertension     Past Surgical History:  Procedure Laterality Date  . EYE SURGERY  8199   80 years old  . HERNIA REPAIR  1995  . NOSE SURGERY  1990  . PROSTATE SURGERY  2000   seed inplants  . SHOULDER SURGERY  2008    Family History  Problem Relation Age of Onset  . Hyperlipidemia Brother   . Stroke Brother   . Diabetes Brother        type ll  . Alcohol abuse Father     Social History   Socioeconomic History   . Marital status: Divorced    Spouse name: Not on file  . Number of children: 3  . Years of education: Not on file  . Highest education level: Some college, no degree  Occupational History  . Occupation: retired  Tobacco Use  . Smoking status: Never Smoker  . Smokeless tobacco: Never Used  Vaping Use  . Vaping Use: Never used  Substance and Sexual Activity  . Alcohol use: Yes    Alcohol/week: 7.0 standard drinks    Types: 7 Glasses of wine per week    Comment: have 1/2 a day; wine or other   . Drug use: No  . Sexual activity: Not Currently  Other Topics Concern  . Not on file  Social History Narrative   Lives alone in one level. Has two sons 1 daughter and friends who serve as support.   Former Chiropodist; Enjoys Data processing manager, reading   Originally from [place]   Social Determinants of Health   Financial Resource Strain: [place]   . Difficulty of Paying Living Expenses: Not hard at all  Food Insecurity: Not on file  Transportation Needs: No Transportation Needs  . Lack of Transportation (Medical): No  . Lack of Transportation (Non-Medical): No  Physical Activity: Not on  file  Stress: Not on file  Social Connections: Not on file  Intimate Partner Violence: Not on file    Outpatient Medications Prior to Visit  Medication Sig Dispense Refill  . acyclovir (ZOVIRAX) 400 MG tablet TAKE 1 TABLET (400 MG TOTAL) BY MOUTH AS NEEDED. 90 tablet 2  . amLODipine (NORVASC) 5 MG tablet Take 1 tablet (5 mg total) by mouth daily. 90 tablet 3  . azelastine (ASTELIN) 0.1 % nasal spray Place 1 spray into both nostrils 2 (two) times daily. Use in each nostril as directed 30 mL 12  . ciclopirox (LOPROX) 0.77 % cream APPLY TO AFFECTED AREA(S) TWO TIMES A DAY AS DIRECTED 15 g 0  . desonide (DESOWEN) 0.05 % cream Apply 1 application topically 2 (two) times daily as needed.    . fluorouracil (EFUDEX) 5 % cream Apply topically as needed. Per Dr Joaquim Lai    . gabapentin (NEURONTIN) 100 MG capsule  Take one to two capsules by mouth at night as needed for back pain. 60 capsule 3  . hydrochlorothiazide (MICROZIDE) 12.5 MG capsule Take 1 capsule (12.5 mg total) by mouth daily. 90 capsule 3  . ibuprofen (ADVIL) 800 MG tablet Take 1 tablet (800 mg total) by mouth every 8 (eight) hours as needed. 30 tablet 0  . ibuprofen (ADVIL,MOTRIN) 600 MG tablet Only use 800 mg  8 hours sparingly. (Patient taking differently: Take 600 mg by mouth every 6 (six) hours as needed. Only use 800 mg  8 hours sparingly.) 60 tablet 0  . levocetirizine (XYZAL) 5 MG tablet TAKE ONE TABLET BY MOUTH EVERY EVENING 90 tablet 0  . LORazepam (ATIVAN) 0.5 MG tablet Take one to two tablets one hour prior to procedure 2 tablet 0  . Multiple Vitamins-Minerals (CENTRUM SILVER 50+MEN PO) Take 1 tablet by mouth daily as needed.    [name] omeprazole (PRILOSEC) 20 MG capsule TAKE ONE CAPSULE BY MOUTH TWICE A DAY 180 capsule 0  . Oxymetazoline HCl (NASAL SPRAY) 0.05 % SOLN Place into the nose.    . predniSONE (DELTASONE) 20 MG tablet Take 1 tablet (20 mg total) by mouth daily with breakfast. 14 tablet 0  . traMADol (ULTRAM) 50 MG tablet Take 50 mg by mouth every 6 (six) hours as needed.    . tretinoin (RETIN-A) 0.05 % cream     . triamcinolone cream (KENALOG) 0.1 % APPLY TO AFFECTED AREA(S) TWO TIMES A DAY AS DIRECTED 30 g 0  . VITAMIN D, CHOLECALCIFEROL, PO Take 1 tablet by mouth daily as needed.    . zinc gluconate 50 MG tablet Take 50 mg by mouth daily as needed.     No facility-administered medications prior to visit.    No Known Allergies  ROS Review of Systems  Constitutional: Negative for chills, fever and unexpected weight change.  Eyes: Negative for pain.  Respiratory: Negative for cough and shortness of breath.   Cardiovascular: Negative for chest pain.  Gastrointestinal: Negative for abdominal pain.  Musculoskeletal: Positive for arthralgias.  Neurological: Negative for headaches.      Objective:    Physical  Exam Vitals reviewed.  Constitutional:      Appearance: Normal appearance.  Eyes:     Comments: Pupils equal round reactive to light.  Extraocular movements normal.  He has cataracts bilaterally which makes visualization of fundus somewhat difficult.  Cardiovascular:     Rate and Rhythm: Normal rate and regular rhythm.  Pulmonary:     Effort: Pulmonary effort is normal.  Breath sounds: Normal breath sounds.  Musculoskeletal:     Cervical back: Neck supple.     Right lower leg: No edema.     Left lower leg: No edema.  Neurological:     Mental Status: He is alert.     BP (!) 142/70 (BP Location: Left Arm, Patient Position: Sitting, Cuff Size: Normal)   Pulse 74   Temp 98.2 F (36.8 C) (Oral)   Wt 162 lb 8 oz (73.7 kg)   SpO2 97%   BMI 24.00 kg/m  Wt Readings from Last 3 Encounters:  05/31/20 162 lb 8 oz (73.7 kg)  05/22/20 162 lb 11.2 oz (73.8 kg)  03/07/20 164 lb (74.4 kg)     Health Maintenance Due  Topic Date Due  . COVID-19 Vaccine (1) Never done    There are no preventive care reminders to display for this patient.  Lab Results  Component Value Date   TSH 2.96 04/05/2019   Lab Results  Component Value Date   WBC 7.9 02/09/2020   HGB 13.8 02/09/2020   HCT 40.4 02/09/2020   MCV 93.0 02/09/2020   PLT 231.0 02/09/2020   Lab Results  Component Value Date   NA 136 02/09/2020   K 4.1 02/09/2020   CO2 28 02/09/2020   GLUCOSE 146 (H) 02/09/2020   BUN 15 02/09/2020   CREATININE 0.89 02/09/2020   BILITOT 0.6 02/09/2020   ALKPHOS 70 02/09/2020   AST 12 02/09/2020   ALT 11 02/09/2020   PROT 6.5 02/09/2020   ALBUMIN 3.8 02/09/2020   CALCIUM 9.0 02/09/2020   GFR 80.97 02/09/2020   Lab Results  Component Value Date   CHOL 163 04/05/2019   Lab Results  Component Value Date   HDL 47.60 04/05/2019   Lab Results  Component Value Date   LDLCALC 95 04/05/2019   Lab Results  Component Value Date   TRIG 103.0 04/05/2019   Lab Results  Component  Value Date   CHOLHDL 3 04/05/2019   Lab Results  Component Value Date   HGBA1C 5.4 12/08/2015      Assessment & Plan:   #1 neck and upper back pain along with systemic issues of malaise for several weeks.  Mildly elevated sed rate of 37.  Patient did have significant improvement with prednisone though not total resolution.  Question is whether this represents polymyalgia rheumatica.  Still not totally clear.  -We discussed tapering his prednisone after 4 more days of 20 mg dose to 15 mg daily for 5 days then taper to 10 mg daily for 3 days then 5 mg daily for 3 days and then discontinue.  If he has breakthrough symptoms after tapering down or off prednisone be in touch promptly  #2 chronic right shoulder pain.  History of multiple remote injury secondary to hockey.  Patient requesting referral for physical therapy for that.  He has benefited some from physical therapy for his back.  We will set up referral for physical therapy for strengthening exercises right shoulder  #3 intermittent visual symptoms as above.  Currently asymptomatic.  Does have bilateral cataracts. -Needs ophthalmology assessment.  He already has appointment with ophthalmologist.  We recommended immediate assessment if he has any loss of vision or new symptoms  Meds ordered this encounter  Medications  . predniSONE (DELTASONE) 10 MG tablet    Sig: Take one and one half tablets by mouth once daily for 5 days then one tablet by mouth daily for 3 days then one half  tablet by mouth once daily for 3 days and then discontinue.    Dispense:  13 tablet    Refill:  0    Follow-up: No follow-ups on file.    [name], MD

## 2020-06-01 ENCOUNTER — Other Ambulatory Visit: Payer: Self-pay

## 2020-06-01 ENCOUNTER — Ambulatory Visit (INDEPENDENT_AMBULATORY_CARE_PROVIDER_SITE_OTHER): Payer: Medicare Other

## 2020-06-01 VITALS — BP 138/62 | HR 70 | Temp 98.2°F | Ht 68.0 in | Wt 162.0 lb

## 2020-06-01 DIAGNOSIS — Z0000 Encounter for general adult medical examination without abnormal findings: Secondary | ICD-10-CM | POA: Diagnosis not present

## 2020-06-01 NOTE — Patient Instructions (Signed)
Drew Smith , Thank you for taking time to come for your Medicare Wellness Visit. I appreciate your ongoing commitment to your health goals. Please review the following plan we discussed and let me know if I can assist you in the future.   Screening recommendations/referrals: Colonoscopy: no longer needed Recommended yearly ophthalmology/optometry visit for glaucoma screening and checkup Recommended yearly dental visit for hygiene and checkup  Vaccinations: Influenza vaccine: refuses Pneumococcal vaccine: current Tdap vaccine: refuses Shingles vaccine: refuses  Advanced directives: will provide copies  Conditions/risks identified:none  Next appointment: none  Preventive Care 16 Years and Older, Male Preventive care refers to lifestyle choices and visits with your health care provider that can promote health and wellness. What does preventive care include?  A yearly physical exam. This is also called an annual well check.  Dental exams once or twice a year.  Routine eye exams. Ask your health care provider how often you should have your eyes checked.  Personal lifestyle choices, including:  Daily care of your teeth and gums.  Regular physical activity.  Eating a healthy diet.  Avoiding tobacco and drug use.  Limiting alcohol use.  Practicing safe sex.  Taking low doses of aspirin every day.  Taking vitamin and mineral supplements as recommended by your health care provider. What happens during an annual well check? The services and screenings done by your health care provider during your annual well check will depend on your age, overall health, lifestyle risk factors, and family history of disease. Counseling  Your health care provider may ask you questions about your:  Alcohol use.  Tobacco use.  Drug use.  Emotional well-being.  Home and relationship well-being.  Sexual activity.  Eating habits.  History of falls.  Memory and ability to  understand (cognition).  Work and work Statistician. Screening  You may have the following tests or measurements:  Height, weight, and BMI.  Blood pressure.  Lipid and cholesterol levels. These may be checked every 5 years, or more frequently if you are over 61 years old.  Skin check.  Lung cancer screening. You may have this screening every year starting at age 46 if you have a 30-pack-year history of smoking and currently smoke or have quit within the past 15 years.  Fecal occult blood test (FOBT) of the stool. You may have this test every year starting at age 26.  Flexible sigmoidoscopy or colonoscopy. You may have a sigmoidoscopy every 5 years or a colonoscopy every 10 years starting at age 75.  Prostate cancer screening. Recommendations will vary depending on your family history and other risks.  Hepatitis C blood test.  Hepatitis B blood test.  Sexually transmitted disease (STD) testing.  Diabetes screening. This is done by checking your blood sugar (glucose) after you have not eaten for a while (fasting). You may have this done every 1-3 years.  Abdominal aortic aneurysm (AAA) screening. You may need this if you are a current or former smoker.  Osteoporosis. You may be screened starting at age 9 if you are at high risk. Talk with your health care provider about your test results, treatment options, and if necessary, the need for more tests. Vaccines  Your health care provider may recommend certain vaccines, such as:  Influenza vaccine. This is recommended every year.  Tetanus, diphtheria, and acellular pertussis (Tdap, Td) vaccine. You may need a Td booster every 10 years.  Zoster vaccine. You may need this after age 26.  Pneumococcal 13-valent conjugate (PCV13) vaccine. One  dose is recommended after age 70.  Pneumococcal polysaccharide (PPSV23) vaccine. One dose is recommended after age 58. Talk to your health care provider about which screenings and vaccines you  need and how often you need them. This information is not intended to replace advice given to you by your health care provider. Make sure you discuss any questions you have with your health care provider. Document Released: 03/10/2015 Document Revised: 11/01/2015 Document Reviewed: 12/13/2014 Elsevier Interactive Patient Education  2017 [place] Prevention in the Home Falls can cause injuries. They can happen to people of all ages. There are many things you can do to make your home safe and to help prevent falls. What can I do on the outside of my home?  Regularly fix the edges of walkways and driveways and fix any cracks.  Remove anything that might make you trip as you walk through a door, such as a raised step or threshold.  Trim any bushes or trees on the path to your home.  Use bright outdoor lighting.  Clear any walking paths of anything that might make someone trip, such as rocks or tools.  Regularly check to see if handrails are loose or broken. Make sure that both sides of any steps have handrails.  Any raised decks and porches should have guardrails on the edges.  Have any leaves, snow, or ice cleared regularly.  Use sand or salt on walking paths during winter.  Clean up any spills in your garage right away. This includes oil or grease spills. What can I do in the bathroom?  Use night lights.  Install grab bars by the toilet and in the tub and shower. Do not use towel bars as grab bars.  Use non-skid mats or decals in the tub or shower.  If you need to sit down in the shower, use a plastic, non-slip stool.  Keep the floor dry. Clean up any water that spills on the floor as soon as it happens.  Remove soap buildup in the tub or shower regularly.  Attach bath mats securely with double-sided non-slip rug tape.  Do not have throw rugs and other things on the floor that can make you trip. What can I do in the bedroom?  Use night lights.  Make sure  that you have a light by your bed that is easy to reach.  Do not use any sheets or blankets that are too big for your bed. They should not hang down onto the floor.  Have a firm chair that has side arms. You can use this for support while you get dressed.  Do not have throw rugs and other things on the floor that can make you trip. What can I do in the kitchen?  Clean up any spills right away.  Avoid walking on wet floors.  Keep items that you use a lot in easy-to-reach places.  If you need to reach something above you, use a strong step stool that has a grab bar.  Keep electrical cords out of the way.  Do not use floor polish or wax that makes floors slippery. If you must use wax, use non-skid floor wax.  Do not have throw rugs and other things on the floor that can make you trip. What can I do with my stairs?  Do not leave any items on the stairs.  Make sure that there are handrails on both sides of the stairs and use them. Fix handrails that are broken or  loose. Make sure that handrails are as long as the stairways.  Check any carpeting to make sure that it is firmly attached to the stairs. Fix any carpet that is loose or worn.  Avoid having throw rugs at the top or bottom of the stairs. If you do have throw rugs, attach them to the floor with carpet tape.  Make sure that you have a light switch at the top of the stairs and the bottom of the stairs. If you do not have them, ask someone to add them for you. What else can I do to help prevent falls?  Wear shoes that:  Do not have high heels.  Have rubber bottoms.  Are comfortable and fit you well.  Are closed at the toe. Do not wear sandals.  If you use a stepladder:  Make sure that it is fully opened. Do not climb a closed stepladder.  Make sure that both sides of the stepladder are locked into place.  Ask someone to hold it for you, if possible.  Clearly mark and make sure that you can see:  Any grab bars or  handrails.  First and last steps.  Where the edge of each step is.  Use tools that help you move around (mobility aids) if they are needed. These include:  Canes.  Walkers.  Scooters.  Crutches.  Turn on the lights when you go into a dark area. Replace any light bulbs as soon as they burn out.  Set up your furniture so you have a clear path. Avoid moving your furniture around.  If any of your floors are uneven, fix them.  If there are any pets around you, be aware of where they are.  Review your medicines with your doctor. Some medicines can make you feel dizzy. This can increase your chance of falling. Ask your doctor what other things that you can do to help prevent falls. This information is not intended to replace advice given to you by your health care provider. Make sure you discuss any questions you have with your health care provider. Document Released: 12/08/2008 Document Revised: 07/20/2015 Document Reviewed: 03/18/2014 Elsevier Interactive Patient Education  2017 [employer].

## 2020-06-02 DIAGNOSIS — M48061 Spinal stenosis, lumbar region without neurogenic claudication: Secondary | ICD-10-CM | POA: Diagnosis not present

## 2020-06-02 DIAGNOSIS — M4310 Spondylolisthesis, site unspecified: Secondary | ICD-10-CM | POA: Diagnosis not present

## 2020-06-06 ENCOUNTER — Other Ambulatory Visit: Payer: Self-pay

## 2020-06-06 ENCOUNTER — Ambulatory Visit: Payer: Medicare Other | Attending: Family Medicine | Admitting: Physical Therapy

## 2020-06-06 ENCOUNTER — Encounter: Payer: Self-pay | Admitting: Physical Therapy

## 2020-06-06 VITALS — BP 146/71

## 2020-06-06 DIAGNOSIS — M25611 Stiffness of right shoulder, not elsewhere classified: Secondary | ICD-10-CM | POA: Diagnosis not present

## 2020-06-06 DIAGNOSIS — M6281 Muscle weakness (generalized): Secondary | ICD-10-CM | POA: Insufficient documentation

## 2020-06-06 DIAGNOSIS — M25511 Pain in right shoulder: Secondary | ICD-10-CM | POA: Diagnosis not present

## 2020-06-06 DIAGNOSIS — G8929 Other chronic pain: Secondary | ICD-10-CM | POA: Diagnosis not present

## 2020-06-06 NOTE — Patient Instructions (Signed)
Access Code: [email or web address] URL: [email or web address] Date: 06/06/2020 Prepared by: [name]  Exercises Supine Lower Trunk Rotation - 2 x daily - 7 x weekly - 1 sets - 5 reps - 10 sec hold Supine Single Knee to Chest Stretch - 2 x daily - 7 x weekly - 1 sets - 3 reps - 20 sec hold Supine Piriformis Stretch with Leg Straight - 2 x daily - 7 x weekly - 1 sets - 3 reps - 30 sec hold Supine Piriformis Stretch - 2 x daily - 7 x weekly - 1 sets - 3 reps - 30 hold Standing Hamstring Stretch on Chair - 2 x daily - 7 x weekly - 1 sets - 3 reps - 30 hold Sidelying Thoracic Rotation with Open Book - 1 x daily - 7 x weekly - 5 reps - 1 sets - 10 sec hold Hooklying Small March - 1 x daily - 7 x weekly - 10 reps - 2 sets Thoracic Extension Mobilization on Foam Roll - 1 x daily - 7 x weekly - 2 sets - 10 reps Seated Upper Thoracic Stretch - 1 x daily - 7 x weekly - 2 sets - 10 reps

## 2020-06-06 NOTE — Therapy (Signed)
[facility] [address] [place], [place], [address] Phone: [phone]   Fax:  [phone]  Physical Therapy Evaluation  Patient Details  Name: Drew Smith MRN: [id number] Date of Birth: 10/21/1940 Referring Provider (PT): Dr. Elease Hashimoto   Encounter Date: 06/06/2020   PT End of Session - 06/06/20 2242    Visit Number 1    Date for PT Re-Evaluation 08/01/20    Authorization Type UHC MCR    Progress Note Due on Visit 10    PT Start Time 7106    PT Stop Time 1530    PT Time Calculation (min) 45 min    Activity Tolerance Patient tolerated treatment well           Past Medical History:  Diagnosis Date  . Arthritis   . Asthma   . Cancer [facility])    prostate  . GERD (gastroesophageal reflux disease)   . Guillain-Barre syndrome ([place]) 2000  . Hyperlipidemia   . Hypertension     Past Surgical History:  Procedure Laterality Date  . EYE SURGERY  9036   80 years old  . HERNIA REPAIR  1995  . NOSE SURGERY  1990  . PROSTATE SURGERY  2000   seed inplants  . SHOULDER SURGERY  2008    Vitals:   06/06/20 1449  BP: (!) 146/71      Subjective Assessment - 06/06/20 1449    Subjective My right shoulder hurts to pour coffee pot.  Concerned about playing golf.  Minimal pain at rest.  Some pain with right sidelying.  Stopped push ups b/c made worse.  It has improved. Reaching overhead OK. Doing band pull downs OK.  Things was given for left shoulder in the past.  2-3# ball overhead.    Pertinent History prostrate CA/surgery, shoulder surgery.    Limitations House hold activities    How long can you sit comfortably? 30 min to 1 hour at most    Diagnostic tests MRI Severe L4-5 facet arthrosis with grade 1 anterolisthesis and  severe spinal stenosis; Mild spinal stenosis and mild-to-moderate lateral recess stenosis  at L3-4.    Patient Stated Goals get back to golf    Currently in Pain? Yes    Pain Score 3    at  worst 5-6/10   Pain Location Shoulder    Pain Orientation Right;Lateral;Posterior    Pain Type Chronic pain              [facility] PT Assessment - 06/06/20 0001      Assessment   Medical Diagnosis right shoulder pain    Referring Provider (PT) Dr. Elease Hashimoto    Hand Dominance Right    Next MD Visit several weeks    Prior Therapy recently for LBP and a few years ago for left shoulder pain      Precautions   Precaution Comments prostate CA      Observation/Other Assessments   Focus on Therapeutic Outcomes (FOTO)  shoulder 61%      Posture/Postural Control   Posture Comments inc thoracic kyphosis; right scapular elevation and mild winging      AROM   Right Shoulder Flexion 125 Degrees    Right Shoulder ABduction 118 Degrees    Right Shoulder Internal Rotation --   T8   Right Shoulder External Rotation 70 Degrees   compensation   Left Shoulder Flexion 125 Degrees    Left Shoulder ABduction 144 Degrees    Left Shoulder Internal  Rotation 58 Degrees   T10     Strength   Right Shoulder Flexion 4/5    Right Shoulder Extension 4/5    Right Shoulder ABduction 4/5    Right Shoulder Internal Rotation 4/5    Right Shoulder External Rotation 4/5      Hawkins-Kennedy test   Findings Negative      Empty Can test   Findings Negative      Lag time at 0 degrees   Findings Negative      Drop Arm test   Findings Negative                      Objective measurements completed on examination: See above findings.               PT Education - 06/06/20 2241    Education Details supine and seated thoracic extension with and without golf club elevation    Person(s) Educated Patient    Methods Explanation;Demonstration;Handout    Comprehension Returned demonstration;Verbalized understanding            PT Short Term Goals - 06/06/20 2253      PT SHORT TERM GOAL #1   Title Pt will be ind and compliant with initial HEP    Status Achieved              PT Long Term Goals - 06/06/20 2253      PT LONG TERM GOAL #1   Title Pt will be ind in advanced HEP for lumbar and LE flexibilty and shoulder mobility and strength    Time 8    Period Weeks    Status Revised    Target Date 08/01/20      PT LONG TERM GOAL #2   Title Pt will be able to play a round of golf with pain at a low intensity during and following    Time 8    Period Weeks    Status Revised      PT LONG TERM GOAL #3   Title Right shoulder elevation to 150 degrees needed for return to golf    Time 8    Period Weeks    Status New      PT LONG TERM GOAL #4   Title Improved FOTO score for shoulder improved from 61 to 70%    Time 8    Period Weeks    Status New      PT LONG TERM GOAL #5   Title Right strength grossly 4+/5 needed for lifting the coffee pot    Time 8    Period Weeks    Status New                  Plan - 06/06/20 1520    Clinical Impression Statement The patient had been receiving PT services for LBP (referred by Leonie Green NP)  however last visit he states his right shoulder is more bothersome than his back.  He consulted with his PCP Dr. Elease Hashimoto who sent a referral for right shoulder pain.  At the patient's request will focus on his shoulder at this time.  He states the pain is posterior lateral aspect of shoulder and present with lifting the coffee pot, right sidelying and is keeping him from playing golf.  He also enjoys shag dancing and this is painful as well.  He used to do push ups and wonders if this aggravated his shoulder.   Increased thoracic kyphosis and  right scapular elevation and slight winging noted.  Limited right shoulder flexion 125 degrees, abduction 118 degrees, internal rotation WFLs, external rotation 70 degrees.  Right glenohumeral and scapular strength grossly 4/5.  FOTO score is 61%.  He would benefit from PT to address these deficits.    Personal Factors and Comorbidities Comorbidity 2;Comorbidity 3+    Comorbidities  anterolisthesis, spinal stenosis, left shoulder surgery; Jossie Ng 2000    Stability/Clinical Decision Making Stable/Uncomplicated    Rehab Potential Good    PT Frequency 2x / week    PT Duration 8 weeks    PT Treatment/Interventions ADLs/Self Care Home Management;Electrical Stimulation;Iontophoresis 4mg /ml Dexamethasone;Therapeutic exercise;Therapeutic activities;Neuromuscular re-education;Manual techniques;Patient/family education;Dry needling    PT Next Visit Plan Monitor BP every visit; review thoracic extension with added golf club elevation; try bent rows, shoulder extensions, horizontal abduction ; ex to aid return to golf    PT Home Exercise Plan [email or web address]           Patient will benefit from skilled therapeutic intervention in order to improve the following deficits and impairments:  Decreased range of motion,Pain,Increased muscle spasms,Decreased activity tolerance,Impaired flexibility,Postural dysfunction,Decreased mobility  Visit Diagnosis: Chronic right shoulder pain - Plan: PT plan of care cert/re-cert  Muscle weakness (generalized) - Plan: PT plan of care cert/re-cert  Stiffness of right shoulder, not elsewhere classified - Plan: PT plan of care cert/re-cert     Problem List Patient Active Problem List   Diagnosis Date Noted  . Malignant melanoma of left forearm ([place]) 10/19/2019  . Upper respiratory tract infection 02/14/2017  . Porokeratosis 09/07/2014  . Metatarsal deformity 09/07/2014  . Pain in lower limb 09/07/2014  . Squamous cell carcinoma in situ of skin 10/15/2013  . Hives 10/06/2012  . History of Guillain-Barre syndrome 03/07/2011  . Hypertension 11/28/2010  . Dyslipidemia 11/28/2010  . ERECTILE DYSFUNCTION 01/05/2008  . ASTHMA 10/28/2007  . GERD 10/28/2007  . DIZZINESS 10/28/2007  . COUGH 10/28/2007  . FASTING HYPERGLYCEMIA 10/28/2007  . PROSTATE CANCER, HX OF 10/28/2007   [name], PT 06/06/20 10:59 PM Phone: [phone] Fax:  [phone] Alvera Singh 06/06/2020, 10:59 PM  [facility] [address], [place], [place], [address] Phone: [phone]   Fax:  [phone]  Name: ERIQ HUFFORD MRN: [id number] Date of Birth: Jan 21, 1941

## 2020-06-13 ENCOUNTER — Ambulatory Visit: Payer: Medicare Other | Admitting: Physical Therapy

## 2020-06-13 ENCOUNTER — Encounter: Payer: Self-pay | Admitting: Physical Therapy

## 2020-06-13 ENCOUNTER — Other Ambulatory Visit: Payer: Self-pay

## 2020-06-13 VITALS — BP 145/69

## 2020-06-13 DIAGNOSIS — M6281 Muscle weakness (generalized): Secondary | ICD-10-CM

## 2020-06-13 DIAGNOSIS — M25511 Pain in right shoulder: Secondary | ICD-10-CM | POA: Diagnosis not present

## 2020-06-13 DIAGNOSIS — M25611 Stiffness of right shoulder, not elsewhere classified: Secondary | ICD-10-CM

## 2020-06-13 DIAGNOSIS — G8929 Other chronic pain: Secondary | ICD-10-CM | POA: Diagnosis not present

## 2020-06-13 NOTE — Patient Instructions (Signed)
Access Code: [email or web address] URL: [email or web address] Date: 06/13/2020 Prepared by: Jari Favre  Exercises Supine Lower Trunk Rotation - 2 x daily - 7 x weekly - 1 sets - 5 reps - 10 sec hold Supine Single Knee to Chest Stretch - 2 x daily - 7 x weekly - 1 sets - 3 reps - 20 sec hold Supine Piriformis Stretch with Leg Straight - 2 x daily - 7 x weekly - 1 sets - 3 reps - 30 sec hold Supine Piriformis Stretch - 2 x daily - 7 x weekly - 1 sets - 3 reps - 30 hold Standing Hamstring Stretch on Chair - 2 x daily - 7 x weekly - 1 sets - 3 reps - 30 hold Sidelying Thoracic Rotation with Open Book - 1 x daily - 7 x weekly - 5 reps - 1 sets - 10 sec hold Hooklying Small March - 1 x daily - 7 x weekly - 10 reps - 2 sets Thoracic Extension Mobilization on Foam Roll - 1 x daily - 7 x weekly - 2 sets - 10 reps Seated Upper Thoracic Stretch - 1 x daily - 7 x weekly - 2 sets - 10 reps Standing Row with Anchored Resistance - 1 x daily - 7 x weekly - 3 sets - 10 reps Shoulder extension with resistance - Neutral - 1 x daily - 7 x weekly - 3 sets - 10 reps Supine Shoulder Horizontal Abduction with Resistance - 1 x daily - 7 x weekly - 3 sets - 10 reps Standing Shoulder Diagonal Horizontal Abduction 60/120 Degrees with Resistance - 1 x daily - 7 x weekly - 3 sets - 10 reps Supine Shoulder External Rotation with Resistance - 1 x daily - 7 x weekly - 3 sets - 10 reps

## 2020-06-13 NOTE — Therapy (Signed)
[facility] [address] [place], [place], [address] Phone: [phone]   Fax:  [phone]  Physical Therapy Treatment  Patient Details  Name: Drew Smith MRN: [id number] Date of Birth: 12/19/1940 Referring Provider (PT): Dr. Elease Hashimoto   Encounter Date: 06/13/2020   PT End of Session - 06/13/20 1407    Visit Number 2    Date for PT Re-Evaluation 08/01/20    Authorization Type UHC MCR    PT Start Time 1400    PT Stop Time 1440    PT Time Calculation (min) 40 min    Activity Tolerance Patient tolerated treatment well    Behavior During Therapy [facility] for tasks assessed/performed           Past Medical History:  Diagnosis Date  . Arthritis   . Asthma   . Cancer [facility])    prostate  . GERD (gastroesophageal reflux disease)   . Guillain-Barre syndrome ([place]) 2000  . Hyperlipidemia   . Hypertension     Past Surgical History:  Procedure Laterality Date  . EYE SURGERY  421   80 years old  . HERNIA REPAIR  1995  . NOSE SURGERY  1990  . PROSTATE SURGERY  2000   seed inplants  . SHOULDER SURGERY  2008    Vitals:   06/13/20 1401  BP: (!) 145/69     Subjective Assessment - 06/13/20 1401    Subjective Pt states pain is low today.    Diagnostic tests MRI Severe L4-5 facet arthrosis with grade 1 anterolisthesis and  severe spinal stenosis; Mild spinal stenosis and mild-to-moderate lateral recess stenosis  at L3-4.    Patient Stated Goals get back to golf    Currently in Pain? Yes    Pain Score 3     Pain Location Shoulder    Pain Orientation Right    Pain Descriptors / Indicators Dull    Pain Type Chronic pain    Pain Frequency Intermittent    Multiple Pain Sites No                             [facility] Adult PT Treatment/Exercise - 06/13/20 0001      Exercises   Exercises Shoulder      Shoulder Exercises: Supine   Horizontal ABduction Strengthening;Both;15 reps;Theraband     Theraband Level (Shoulder Horizontal ABduction) Level 2 (Red)    External Rotation Strengthening;Both;15 reps;Theraband    Theraband Level (Shoulder External Rotation) Level 2 (Red)    Flexion Both;AAROM   golf club   Diagonals Strengthening;Both;15 reps;Theraband    Theraband Level (Shoulder Diagonals) Level 2 (Red)    Other Supine Exercises press up golf club with 1lb added      Shoulder Exercises: Seated   Other Seated Exercises pully 3 min - cues for posture      Shoulder Exercises: Prone   Other Prone Exercises bent over row - 2lb 2x10      Shoulder Exercises: Standing   Extension Strengthening;Both;20 reps;Theraband    Theraband Level (Shoulder Extension) Level 2 (Red)    Row Strengthening;Both;20 reps;Theraband    Theraband Level (Shoulder Row) Level 3 (Green)      Shoulder Exercises: Therapy Ball   Flexion 10 reps   rolling up the wall   Other Therapy Ball Exercises rolling mat flexion ROM  PT Education - 06/13/20 1515    Education Details Access Code: [email or web address]    Person(s) Educated Patient    Methods Explanation;Demonstration;Tactile cues;Verbal cues;Handout    Comprehension Verbalized understanding;Returned demonstration            PT Short Term Goals - 06/06/20 2253      PT SHORT TERM GOAL #1   Title Pt will be ind and compliant with initial HEP    Status Achieved             PT Long Term Goals - 06/13/20 1502      PT LONG TERM GOAL #1   Title Pt will be ind in advanced HEP for lumbar and LE flexibilty and shoulder mobility and strength    Baseline progressing strength and spinal flexibility/ROM    Status On-going                 Plan - 06/13/20 1417    Clinical Impression Statement Pt was monitored for pain throughout.  Stated his shoulder were getting more achy during the last exercise during treatment which was shoulder flexion on table using pball to roll out.  Pt did a lot of exercises using resistance band today  and added to HEP.  Pt denied increased pain at the end of session when resting.  Pt requested a blue band which he has at home but advised to stick with red for now based on his fatigue after using the red band . Pt will benefit from skilled PT to continue to progress posutre and shoulder strength for maximum return to activities.    Comorbidities anterolisthesis, spinal stenosis, left shoulder surgery; Jossie Ng 2000    PT Treatment/Interventions ADLs/Self Care Home Management;Electrical Stimulation;Iontophoresis 4mg /ml Dexamethasone;Therapeutic exercise;Therapeutic activities;Neuromuscular re-education;Manual techniques;Patient/family education;Dry needling    PT Next Visit Plan Monitor BP every visit; review thoracic extension with added golf club elevation; try bent rows, shoulder extensions, horizontal abduction ; ex to aid return to golf    PT Home Exercise Plan [email or web address]    Consulted and Agree with Plan of Care Patient           Patient will benefit from skilled therapeutic intervention in order to improve the following deficits and impairments:  Decreased range of motion,Pain,Increased muscle spasms,Decreased activity tolerance,Impaired flexibility,Postural dysfunction,Decreased mobility  Visit Diagnosis: Chronic right shoulder pain  Muscle weakness (generalized)  Stiffness of right shoulder, not elsewhere classified     Problem List Patient Active Problem List   Diagnosis Date Noted  . Malignant melanoma of left forearm ([place]) 10/19/2019  . Upper respiratory tract infection 02/14/2017  . Porokeratosis 09/07/2014  . Metatarsal deformity 09/07/2014  . Pain in lower limb 09/07/2014  . Squamous cell carcinoma in situ of skin 10/15/2013  . Hives 10/06/2012  . History of Guillain-Barre syndrome 03/07/2011  . Hypertension 11/28/2010  . Dyslipidemia 11/28/2010  . ERECTILE DYSFUNCTION 01/05/2008  . ASTHMA 10/28/2007  . GERD 10/28/2007  . DIZZINESS 10/28/2007  . COUGH  10/28/2007  . FASTING HYPERGLYCEMIA 10/28/2007  . PROSTATE CANCER, HX OF 10/28/2007    Jule Ser, PT 06/13/2020, 3:20 PM  [facility] [address], [place], [place], [address] Phone: [phone]   Fax:  [phone]  Name: Drew Smith MRN: [id number] Date of Birth: 05-15-1940

## 2020-06-20 ENCOUNTER — Ambulatory Visit: Payer: Medicare Other | Admitting: Physical Therapy

## 2020-06-27 ENCOUNTER — Encounter: Payer: Medicare Other | Admitting: Physical Therapy

## 2020-06-28 ENCOUNTER — Telehealth: Payer: Self-pay | Admitting: Pharmacist

## 2020-06-28 ENCOUNTER — Ambulatory Visit (INDEPENDENT_AMBULATORY_CARE_PROVIDER_SITE_OTHER): Payer: Medicare Other | Admitting: Family Medicine

## 2020-06-28 ENCOUNTER — Other Ambulatory Visit: Payer: Self-pay

## 2020-06-28 VITALS — BP 120/60 | HR 71 | Temp 98.2°F | Resp 18 | Ht 68.0 in | Wt 159.4 lb

## 2020-06-28 DIAGNOSIS — R0982 Postnasal drip: Secondary | ICD-10-CM | POA: Diagnosis not present

## 2020-06-28 DIAGNOSIS — I10 Essential (primary) hypertension: Secondary | ICD-10-CM | POA: Diagnosis not present

## 2020-06-28 DIAGNOSIS — L821 Other seborrheic keratosis: Secondary | ICD-10-CM

## 2020-06-28 DIAGNOSIS — J309 Allergic rhinitis, unspecified: Secondary | ICD-10-CM | POA: Diagnosis not present

## 2020-06-28 DIAGNOSIS — R739 Hyperglycemia, unspecified: Secondary | ICD-10-CM | POA: Diagnosis not present

## 2020-06-28 LAB — POCT GLYCOSYLATED HEMOGLOBIN (HGB A1C): Hemoglobin A1C: 5.8 % — AB (ref 4.0–5.6)

## 2020-06-28 NOTE — Progress Notes (Signed)
Established Patient Office Visit  Subjective:  Patient ID: Drew Smith, male    DOB: 06-01-1940  Age: 80 y.o. MRN: [id number]  CC:  Chief Complaint  Patient presents with  . URI    Cough and sore throat- pt feels like his sxs are sinus related. X 10-12 days.    HPI Drew Smith presents for discussion of several issues as follows  Chronic problems include history of prostate cancer, remote history of melanoma, history of Guillain-Barr syndrome, dyslipidemia, history of GERD, hypertension.  He relates approximately 1-1/2-week history of sore throat and occasional mild cough.  This started after doing some yard work and mowing.  He had significant sneezing and nasal congestion afterwards.  Does not describe any fever.  No purulent nasal secretions.  No headaches.  No facial pain.  He apparently was taking over-the-counter Benadryl type product and has noted some slightly slower urinary stream since then.  He recently had significant issues with back pain with history of known lumbar stenosis.  For several weeks he was complaining of excessive fatigue and some stiffness upper back and neck.  We did contemplate possible polymyalgia rheumatica.  His sed rate was not severely elevated but mildly elevated for age at 84.  He had been placed on prednisone and did see some improvement though not total resolution.  This was very equivocal but we suspect that this is probably be PMR base on partial response to prednisone.  He was given a second taper of prednisone but did not see a lot of change.  We did put him in for physical therapy especially for his right shoulder and he does think that is helping.  He played golf for the first time in 6 months today and that seemed to go well.  Overall, he feels he is making progress with regard to his back and shoulder pain.  He has hyperkeratotic lesions including left side of chin and right side of chin that he would like to have treated with liquid  nitrogen.  These are irritating because of shaving.  He has hypertension treated with amlodipine.  He is supposed be on HCTZ but not clear he is taking this regularly.  He had labs back in December and renal function was normal with normal electrolytes.  He did have glucose of 146 but not sure if this was fasting.  Denies any recent polyuria or polydipsia.      Past Medical History:  Diagnosis Date  . Arthritis   . Asthma   . Cancer [facility])    prostate  . GERD (gastroesophageal reflux disease)   . Guillain-Barre syndrome ([place]) 2000  . Hyperlipidemia   . Hypertension     Past Surgical History:  Procedure Laterality Date  . EYE SURGERY  1434   80 years old  . HERNIA REPAIR  1995  . NOSE SURGERY  1990  . PROSTATE SURGERY  2000   seed inplants  . SHOULDER SURGERY  2008    Family History  Problem Relation Age of Onset  . Hyperlipidemia Brother   . Stroke Brother   . Diabetes Brother        type ll  . Alcohol abuse Father     Social History   Socioeconomic History  . Marital status: Divorced    Spouse name: Not on file  . Number of children: 3  . Years of education: Not on file  . Highest education level: Some college, no degree  Occupational History  .  Occupation: retired  Tobacco Use  . Smoking status: Never Smoker  . Smokeless tobacco: Never Used  Vaping Use  . Vaping Use: Never used  Substance and Sexual Activity  . Alcohol use: Yes    Alcohol/week: 7.0 standard drinks    Types: 7 Glasses of wine per week    Comment: have 1/2 a day; wine or other   . Drug use: No  . Sexual activity: Not Currently  Other Topics Concern  . Not on file  Social History Narrative   Lives alone in one level. Has two sons 1 daughter and friends who serve as support.   Former Chiropodist; Enjoys Data processing manager, reading   Originally from [place]   Social Determinants of Health   Financial Resource Strain: [place]   . Difficulty of Paying Living Expenses: Not hard at all   Food Insecurity: No Food Insecurity  . Worried About Charity fundraiser in the Last Year: Never true  . Ran Out of Food in the Last Year: Never true  Transportation Needs: No Transportation Needs  . Lack of Transportation (Medical): No  . Lack of Transportation (Non-Medical): No  Physical Activity: Sufficiently Active  . Days of Exercise per Week: 5 days  . Minutes of Exercise per Session: 60 min  Stress: No Stress Concern Present  . Feeling of Stress : Not at all  Social Connections: Moderately Integrated  . Frequency of Communication with Friends and Family: More than three times a week  . Frequency of Social Gatherings with Friends and Family: More than three times a week  . Attends Religious Services: 1 to 4 times per year  . Active Member of Clubs or Organizations: Yes  . Attends Archivist Meetings: More than 4 times per year  . Marital Status: Divorced  Human resources officer Violence: Not At Risk  . Fear of Current or Ex-Partner: No  . Emotionally Abused: No  . Physically Abused: No  . Sexually Abused: No    Outpatient Medications Prior to Visit  Medication Sig Dispense Refill  . acyclovir (ZOVIRAX) 400 MG tablet TAKE 1 TABLET (400 MG TOTAL) BY MOUTH AS NEEDED. 90 tablet 2  . amLODipine (NORVASC) 5 MG tablet Take 1 tablet (5 mg total) by mouth daily. 90 tablet 3  . azelastine (ASTELIN) 0.1 % nasal spray Place 1 spray into both nostrils 2 (two) times daily. Use in each nostril as directed 30 mL 12  . ciclopirox (LOPROX) 0.77 % cream APPLY TO AFFECTED AREA(S) TWO TIMES A DAY AS DIRECTED 15 g 0  . desonide (DESOWEN) 0.05 % cream Apply 1 application topically 2 (two) times daily as needed.    . fluorouracil (EFUDEX) 5 % cream Apply topically as needed. Per Dr Joaquim Lai    . gabapentin (NEURONTIN) 100 MG capsule Take one to two capsules by mouth at night as needed for back pain. 60 capsule 3  . hydrochlorothiazide (MICROZIDE) 12.5 MG capsule Take 1 capsule (12.5 mg total) by  mouth daily. 90 capsule 3  . ibuprofen (ADVIL) 800 MG tablet Take 1 tablet (800 mg total) by mouth every 8 (eight) hours as needed. 30 tablet 0  . ibuprofen (ADVIL,MOTRIN) 600 MG tablet Only use 800 mg  8 hours sparingly. (Patient taking differently: Take 600 mg by mouth every 6 (six) hours as needed. Only use 800 mg  8 hours sparingly.) 60 tablet 0  . levocetirizine (XYZAL) 5 MG tablet TAKE ONE TABLET BY MOUTH EVERY EVENING 90 tablet 0  .  LORazepam (ATIVAN) 0.5 MG tablet Take one to two tablets one hour prior to procedure 2 tablet 0  . Multiple Vitamins-Minerals (CENTRUM SILVER 50+MEN PO) Take 1 tablet by mouth daily as needed.    [name] omeprazole (PRILOSEC) 20 MG capsule TAKE ONE CAPSULE BY MOUTH TWICE A DAY 180 capsule 0  . Oxymetazoline HCl (NASAL SPRAY) 0.05 % SOLN Place into the nose.    . traMADol (ULTRAM) 50 MG tablet Take 50 mg by mouth every 6 (six) hours as needed.    . tretinoin (RETIN-A) 0.05 % cream     . triamcinolone cream (KENALOG) 0.1 % APPLY TO AFFECTED AREA(S) TWO TIMES A DAY AS DIRECTED 30 g 0  . VITAMIN D, CHOLECALCIFEROL, PO Take 1 tablet by mouth daily as needed.    . zinc gluconate 50 MG tablet Take 50 mg by mouth daily as needed.    . predniSONE (DELTASONE) 10 MG tablet Take one and one half tablets by mouth once daily for 5 days then one tablet by mouth daily for 3 days then one half tablet by mouth once daily for 3 days and then discontinue. 13 tablet 0  . predniSONE (DELTASONE) 20 MG tablet Take 1 tablet (20 mg total) by mouth daily with breakfast. 14 tablet 0   No facility-administered medications prior to visit.    No Known Allergies  ROS Review of Systems  Constitutional: Negative for chills, fever and unexpected weight change.  HENT: Positive for congestion, postnasal drip and sore throat.   Respiratory: Positive for cough. Negative for shortness of breath and wheezing.   Cardiovascular: Negative for chest pain.      Objective:    Physical Exam Vitals  reviewed.  Constitutional:      Appearance: Normal appearance.  HENT:     Mouth/Throat:     Pharynx: Oropharynx is clear. No oropharyngeal exudate.  Cardiovascular:     Rate and Rhythm: Normal rate and regular rhythm.  Pulmonary:     Effort: Pulmonary effort is normal.     Breath sounds: Normal breath sounds.  Musculoskeletal:     Right lower leg: No edema.     Left lower leg: No edema.  Skin:    Comments: He has slightly raised verrucous hyperkeratotic lesion left chin which is only about 3 mm diameter.  No atypical features.  Right shin reveals approximately 5 x 6 mm slightly raised brownish hyperkeratotic/scaly lesion  Neurological:     Mental Status: He is alert.     BP 120/60 (BP Location: Left Arm, Patient Position: Sitting, Cuff Size: Normal)   Pulse 71   Temp 98.2 F (36.8 C) (Oral)   Resp 18   Ht 5\' 8"  (1.727 m)   Wt 159 lb 6.4 oz (72.3 kg)   SpO2 98%   BMI 24.24 kg/m  Wt Readings from Last 3 Encounters:  06/28/20 159 lb 6.4 oz (72.3 kg)  06/01/20 162 lb (73.5 kg)  05/31/20 162 lb 8 oz (73.7 kg)     Health Maintenance Due  Topic Date Due  . COVID-19 Vaccine (1) Never done    There are no preventive care reminders to display for this patient.  Lab Results  Component Value Date   TSH 2.96 04/05/2019   Lab Results  Component Value Date   WBC 7.9 02/09/2020   HGB 13.8 02/09/2020   HCT 40.4 02/09/2020   MCV 93.0 02/09/2020   PLT 231.0 02/09/2020   Lab Results  Component Value Date   NA 136  02/09/2020   K 4.1 02/09/2020   CO2 28 02/09/2020   GLUCOSE 146 (H) 02/09/2020   BUN 15 02/09/2020   CREATININE 0.89 02/09/2020   BILITOT 0.6 02/09/2020   ALKPHOS 70 02/09/2020   AST 12 02/09/2020   ALT 11 02/09/2020   PROT 6.5 02/09/2020   ALBUMIN 3.8 02/09/2020   CALCIUM 9.0 02/09/2020   GFR 80.97 02/09/2020   Lab Results  Component Value Date   CHOL 163 04/05/2019   Lab Results  Component Value Date   HDL 47.60 04/05/2019   Lab Results   Component Value Date   LDLCALC 95 04/05/2019   Lab Results  Component Value Date   TRIG 103.0 04/05/2019   Lab Results  Component Value Date   CHOLHDL 3 04/05/2019   Lab Results  Component Value Date   HGBA1C 5.8 (A) 06/28/2020      Assessment & Plan:   #1 nasal congestive symptoms.  Doubt bacterial sinusitis.  Suspect allergic. -He was cautioned not to use Benadryl because of anticholinergic side effects-such as urinary retention, etc. -Recommend consider over-the-counter plain Claritin or Allegra and also consider over-the-counter Flonase  #2 recent upper back and shoulder pains.  No clear evidence clinically for polymyalgia rheumatica.  He did not see resolution of symptoms with prednisone.  He does seem to be progressing some with physical therapy and recommend he continue with that  #3 seborrheic keratosis involving the face.  No atypical features.  We discussed risk and benefits of treatment with liquid nitrogen which he has had multiple times in the past and he consents.  Lesions (one right side of chin and one on left) were treated without difficulty and patient tolerated well  #4 history of hyperglycemia on labs back in December.  Not clear if these were fasting.  He is nonfasting today.  A1c was checked and is only minimally elevated at 5.8.  Continue to monitor May have been slightly exacerbated by a couple of recent courses of prednisone.  No orders of the defined types were placed in this encounter.   Follow-up: No follow-ups on file.    [name], MD

## 2020-06-28 NOTE — Progress Notes (Signed)
Chronic Care Management Pharmacy Note  06/30/2020 Name:  Drew Smith MRN:  [id number] DOB:  12/19/1940  Subjective: Drew Smith is an 80 y.o. year old male who is a primary patient of Burchette, Alinda Sierras, MD.  The CCM team was consulted for assistance with disease management and care coordination needs.    Engaged with patient face to face for follow up visit in response to provider referral for pharmacy case management and/or care coordination services.   Consent to Services:  The patient was given information about Chronic Care Management services, agreed to services, and gave verbal consent prior to initiation of services.  Please see initial visit note for detailed documentation.   Patient Care Team: Drew Post, MD as PCP - General (Family Medicine) Drew Smith, [place] (Ophthalmology) Drew Smith, [facility] as Pharmacist (Pharmacist)  Recent office visits: 06/28/20 Drew Littler, MD: Patient presented for chronic conditions follow up. A1c only slightly elevated at 5.8%. Recommended avoiding Benadryl and trying Claritin or Allegra.  06/01/20 Drew Pigg, LPN: Patient presented for AWV.  05/31/20 Drew Littler, MD: Patient presented for pain follow up. Continued prednisone taper.  05/22/20 Drew Littler, MD: Patient presented for right shoulder pain. Steroid injection administered.  03/07/20 Drew Littler, MD: Patient presented for leg pain follow up. Prescribed gabapentin PRN back pain and lorazepam prior to procedure.  02/09/20 Drew Littler, MD: Patient presented for back pain. Prescribed tramadol PRN.  Recent consult visits: 06/13/20 Patient presented for PT session for shoulder pain.   04/06/20 Drew Fuelling, MD ([facility]): Unable to access notes.  Hospital visits: 02/12/20 Patient presented to the ED due to leg injury and lightheadedness.  Objective:  Lab Results  Component Value Date   CREATININE 0.89 02/09/2020   BUN 15  02/09/2020   GFR 80.97 02/09/2020   GFRNONAA 102 10/28/2007   GFRAA 124 10/28/2007   NA 136 02/09/2020   K 4.1 02/09/2020   CALCIUM 9.0 02/09/2020   CO2 28 02/09/2020   GLUCOSE 146 (H) 02/09/2020    Lab Results  Component Value Date/Time   HGBA1C 5.8 (A) 06/28/2020 04:16 PM   HGBA1C 5.4 12/08/2015 02:10 PM   HGBA1C 5.4 10/28/2007 12:00 AM   GFR 80.97 02/09/2020 08:11 AM   GFR 72.81 04/05/2019 10:18 AM    Last diabetic Eye exam: No results found for: HMDIABEYEEXA  Last diabetic Foot exam: No results found for: HMDIABFOOTEX   Lab Results  Component Value Date   CHOL 163 04/05/2019   HDL 47.60 04/05/2019   LDLCALC 95 04/05/2019   TRIG 103.0 04/05/2019   CHOLHDL 3 04/05/2019    Hepatic Function Latest Ref Rng & Units 02/09/2020 04/05/2019 03/31/2018  Total Protein 6.0 - 8.3 g/dL 6.5 6.7 6.9  Albumin 3.5 - 5.2 g/dL 3.8 4.0 4.2  AST 0 - 37 U/L '12 14 11  ' ALT 0 - 53 U/L '11 12 11  ' Alk Phosphatase 39 - 117 U/L 70 58 59  Total Bilirubin 0.2 - 1.2 mg/dL 0.6 0.5 0.7  Bilirubin, Direct 0.0 - 0.3 mg/dL - 0.1 0.1    Lab Results  Component Value Date/Time   TSH 2.96 04/05/2019 10:18 AM   TSH 3.31 03/12/2016 08:46 AM    CBC Latest Ref Rng & Units 02/09/2020 04/05/2019 03/31/2018  WBC 4.0 - 10.5 K/uL 7.9 6.3 7.9  Hemoglobin 13.0 - 17.0 g/dL 13.8 14.9 14.9  Hematocrit 39.0 - 52.0 % 40.4 43.3 43.2  Platelets 150.0 - 400.0 K/uL 231.0 233.0 267.0  No results found for: VD25OH  Clinical ASCVD: No  The ASCVD Risk score Drew Smith., 2013) failed to calculate for the following reasons:   The 2013 ASCVD risk score is only valid for ages 75 to 2    Depression screen PHQ 2/9 06/01/2020 03/26/2019 03/31/2018  Decreased Interest 0 0 0  Down, Depressed, Hopeless 0 0 0  PHQ - 2 Score 0 0 0  Altered sleeping - - 0  Tired, decreased energy - - 0  Change in appetite - - 0  Feeling bad or failure about yourself  - - 0  Trouble concentrating - - 0  Moving slowly or fidgety/restless - - 0   Suicidal thoughts - - 0  PHQ-9 Score - - 0  Difficult doing work/chores - - Not difficult at all      Social History   Tobacco Use  Smoking Status Never Smoker  Smokeless Tobacco Never Used   BP Readings from Last 3 Encounters:  06/29/20 (!) 142/70  06/28/20 120/60  06/13/20 (!) 145/69   Pulse Readings from Last 3 Encounters:  06/28/20 71  06/01/20 70  05/31/20 74   Wt Readings from Last 3 Encounters:  06/28/20 159 lb 6.4 oz (72.3 kg)  06/01/20 162 lb (73.5 kg)  05/31/20 162 lb 8 oz (73.7 kg)   BMI Readings from Last 3 Encounters:  06/28/20 24.24 kg/m  06/01/20 24.63 kg/m  05/31/20 24.00 kg/m    Assessment/Interventions: Review of patient past medical history, allergies, medications, health status, including review of consultants reports, laboratory and other test data, was performed as part of comprehensive evaluation and provision of chronic care management services.   SDOH:  (Social Determinants of Health) assessments and interventions performed: No  SDOH Screenings   Alcohol Screen: Low Risk   . Last Alcohol Screening Score (AUDIT): 2  Depression (PHQ2-9): Low Risk   . PHQ-2 Score: 0  Financial Resource Strain: Low Risk   . Difficulty of Paying Living Expenses: Not hard at all  Food Insecurity: No Food Insecurity  . Worried About Charity fundraiser in the Last Year: Never true  . Ran Out of Food in the Last Year: Never true  Housing: Low Risk   . Last Housing Risk Score: 0  Physical Activity: Sufficiently Active  . Days of Exercise per Week: 5 days  . Minutes of Exercise per Session: 60 min  Social Connections: Moderately Integrated  . Frequency of Communication with Friends and Family: More than three times a week  . Frequency of Social Gatherings with Friends and Family: More than three times a week  . Attends Religious Services: 1 to 4 times per year  . Active Member of Clubs or Organizations: Yes  . Attends Archivist Meetings: More  than 4 times per year  . Marital Status: Divorced  Stress: No Stress Concern Present  . Feeling of Stress : Not at all  Tobacco Use: Low Risk   . Smoking Tobacco Use: Never Smoker  . Smokeless Tobacco Use: Never Used  Transportation Needs: No Transportation Needs  . Lack of Transportation (Medical): No  . Lack of Transportation (Non-Medical): No    CCM Care Plan  No Known Allergies  Medications Reviewed Today    Reviewed by Drew Smith, [facility] (Pharmacist) on 06/29/20 at 1153  Med List Status: <None>  Medication Order Taking? Sig Documenting Provider Last Dose Status Informant  acyclovir (ZOVIRAX) 400 MG tablet [id number] Yes TAKE 1 TABLET (400 MG  TOTAL) BY MOUTH AS NEEDED. Drew Post, MD Taking Active   amLODipine (NORVASC) 5 MG tablet [id number] Yes Take 1 tablet (5 mg total) by mouth daily. Drew Post, MD Taking Active   azelastine (ASTELIN) 0.1 % nasal spray [id number] No Place 1 spray into both nostrils 2 (two) times daily. Use in each nostril as directed  Patient not taking: Reported on 06/29/2020   Drew Post, MD Not Taking Active   ciclopirox (LOPROX) 0.77 % cream [id number] No APPLY TO AFFECTED AREA(S) TWO TIMES A DAY AS DIRECTED  Patient not taking: Reported on 06/29/2020   Drew Post, MD Not Taking Active   desonide (DESOWEN) 0.05 % cream [id number] Yes Apply 1 application topically 2 (two) times daily as needed. [provider] Taking Active   fluorouracil (EFUDEX) 5 % cream [id number] No Apply topically as needed. Per Dr Joaquim Lai  Patient not taking: Reported on 06/29/2020   [provider] Not Taking Active   gabapentin (NEURONTIN) 100 MG capsule [id number] No Take one to two capsules by mouth at night as needed for back pain.  Patient not taking: Reported on 06/29/2020   Drew Post, MD Not Taking Active   hydrochlorothiazide (MICROZIDE) 12.5 MG capsule [id number]  Take 1 capsule (12.5 mg total) by mouth daily. Drew Post, MD  Active   ibuprofen (ADVIL) 800 MG tablet [id number] Yes Take 1 tablet (800 mg total) by mouth every 8 (eight) hours as needed. Drew Post, MD Taking Active   levocetirizine (XYZAL) 5 MG tablet [id number] No TAKE ONE TABLET BY MOUTH EVERY EVENING  Patient not taking: Reported on 06/29/2020   Drew Post, MD Not Taking Active   Multiple Vitamins-Minerals (CENTRUM SILVER 50+MEN PO) [id number] Yes Take 1 tablet by mouth daily as needed. [provider] Taking Active   omeprazole (PRILOSEC) 20 MG capsule [id number] Yes TAKE ONE CAPSULE BY MOUTH TWICE A DAY Burchette, Alinda Sierras, MD Taking Active   Oxymetazoline HCl (NASAL SPRAY) 0.05 % SOLN [id number] No Place into the nose.  Patient not taking: Reported on 06/29/2020   [provider] Not Taking Active   traMADol (ULTRAM) 50 MG tablet [id number] Yes Take 50 mg by mouth every 6 (six) hours as needed. [provider] Taking Active   tretinoin (RETIN-A) 0.05 % cream [id number] No   Patient not taking: Reported on 06/29/2020   [provider] Not Taking Active   triamcinolone cream (KENALOG) 0.1 % [id number] Yes APPLY TO AFFECTED AREA(S) TWO TIMES A DAY AS DIRECTED Burchette, Alinda Sierras, MD Taking Active   VITAMIN D, CHOLECALCIFEROL, PO [id number] Yes Take 1 tablet by mouth every other day. [provider] Taking Active   zinc gluconate 50 MG tablet [id number] Yes Take 50 mg by mouth daily as needed. [provider] Taking Active           Patient Active Problem List   Diagnosis Date Noted  . Malignant melanoma of left forearm ([place]) 10/19/2019  . Upper respiratory tract infection 02/14/2017  . Porokeratosis 09/07/2014  . Metatarsal deformity 09/07/2014  . Pain in lower limb 09/07/2014  . Squamous cell carcinoma in situ of skin 10/15/2013  . Hives 10/06/2012  . History of Guillain-Barre syndrome 03/07/2011  . Hypertension 11/28/2010  . Dyslipidemia 11/28/2010  . ERECTILE DYSFUNCTION  01/05/2008  . ASTHMA 10/28/2007  . GERD 10/28/2007  . DIZZINESS 10/28/2007  . COUGH 10/28/2007  . FASTING HYPERGLYCEMIA 10/28/2007  . PROSTATE CANCER, HX OF 10/28/2007  Immunization History  Administered Date(s) Administered  . Pneumococcal Polysaccharide-23 11/28/2010    Conditions to be addressed/monitored:  Hypertension, GERD, Allergic Rhinitis and Pain, Cold Sore Prevention  Care Plan : CCM Pharmacy Care Plan  Updates made by Drew Smith, [place] since 06/30/2020 12:00 AM    Problem: Problem: Hypertension, GERD, Allergic Rhinitis and Pain, Cold Sore Prevention     Long-Range Goal: Patient-Specific Goal   Start Date: 06/29/2020  Expected End Date: 06/29/2021  This Visit's Progress: On track  Priority: High  Note:   Current Barriers:  . Unable to independently monitor therapeutic efficacy . Does not adhere to prescribed medication regimen  Pharmacist Clinical Goal(s):  [name] Patient will achieve adherence to monitoring guidelines and medication adherence to achieve therapeutic efficacy through collaboration with PharmD and provider.   Interventions: . 1:1 collaboration with Drew Post, MD regarding development and update of comprehensive plan of care as evidenced by provider attestation and co-signature . Inter-disciplinary care team collaboration (see longitudinal plan of care) . Comprehensive medication review performed; medication list updated in electronic medical record  Hypertension (BP goal <140/90) -Uncontrolled -Current treatment:  Amlodipine 5 mg 1 tablet daily (only taking every other day)  Hydrochlorothiazide 12.5 mg 1 capsule daily (only taking every other day) -Medications previously tried: none  -Current home readings: 186/70, 163/72 (at the beach) -Current dietary habits: uses very little salt, compares package labels -Current exercise habits: limited due to pain -Denies hypotensive/hypertensive symptoms -Educated on BP goals and benefits of  medications for prevention of heart attack, stroke and kidney damage; Importance of home blood pressure monitoring; Proper BP monitoring technique; -Counseled to monitor BP at home weekly, document, and provide log at future appointments -Counseled on diet and exercise extensively Recommended to continue current medication Counseled on importance of taking blood pressure medications daily. Patient agreed to take amlodipine daily.  GERD (Goal: minimize symptoms of heartburn) -Controlled -Current treatment  . Omeprazole 20 mg 1 capsule once daily (rarely twice daily) -Medications previously tried: none  -Counseled on non-pharmacologic management of symptoms such as elevating the head of your bed, avoiding eating 2-3 hours before bed, avoiding triggering foods such as acidic, spicy, or fatty foods, eating smaller meals, and wearing clothes that are loose around the waist  Allergic rhinitis (Goal: minimize symptoms) -Uncontrolled -Current treatment   Azelastine 0.1% nasal spray 1 spray in both nostrils twice daily PRN  Xyzal 5 mg 1 tablet every evening as needed (not taking)  Oxymetazoline (Sinex) nasal spray 0.05% as needed (not taking) -Medications previously tried: n/a  -Counseled on avoiding use of Benadryl or diphenhydramine for allergies and taking only one Xyzal or Claritin or Zyrtec per day Recommended limiting use of Afrin to 3 days in a row due to rebound congestion and increased BP  Pain (Goal: limiting pain) -Controlled -Current treatment   Gabapentin 100 mg 1-2 capsules at night as needed  Ibuprofen 800 mg 1 tablet as needed  Tylenol 500 mg 1 tablet in AM and 1 tablet in PM   Aleve 220 mg 1 tablet daily as needed (not taking)  Darvocet - had a few tablets of this  Tramadol 50 mg 1 tablet as needed (taking one per day) -Medications previously tried: n/a  -Counseled on limiting use of NSAIDs due to GI bleeds and kidney damage . Topicals -Uncontrolled -Current  treatment   Triamcinolone cream 0.1% apply to affected areas twice daily  Tretinoin 0.05% cream apply every night  Fluorouracil apply topically as needed  Benadryl  cream daily as needed  Ciclopirox 0.77% cream apply to affected area twice daily  Betamethasone lotion as needed  Desonide cream 0.05% as needed for feet (heat rash)  -Medications previously tried: n/a -Counseled on uses for creams and patient was unsure which one was prescribed for different skin conditions  Cold sore prevention (Goal: prevent/treat cold sores) -Controlled -Current treatment  . Acyclovir 400 mg 1 tablet as needed -Medications previously tried: none  -Recommended to continue current medication   Health Maintenance -Vaccine gaps: several (Patient has a history of guillain barre syndrome and prefers to not get vaccines due to this) -Current therapy:   Lipoflavanoid for ear ringing  Leg cramping tablet  Tonic water with quinine  Vitamin D 5000 units as needed  -Educated on Cost vs benefit of each product must be carefully weighed by individual consumer -Patient is satisfied with current therapy and denies issues -Recommended to continue current medication Counseled on taking high dose of vitamin D every other day to avoid overdose  Patient Goals/Self-Care Activities . Patient will:  - take medications as prescribed focus on medication adherence by taking amlodipine every evening check blood pressure weekly, document, and provide at future appointments  Follow Up Plan: Face to Face appointment with care management team member scheduled for:  4 months     Medication Assistance: None required.  Patient affirms current coverage meets needs.  Patient's preferred pharmacy is:  [facility] [address] [place] [address] Phone: [phone] Fax: [phone]  Uses pill box? No - has own system Pt endorses 50% compliance  We  discussed: Current pharmacy is preferred with insurance plan and patient is satisfied with pharmacy services Patient decided to: Continue current medication management strategy  Care Plan and Follow Up Patient Decision:  Patient agrees to Care Plan and Follow-up.  Plan: Face to Face appointment with care management team member scheduled for: 4 months  Jeni Salles, PharmD [place] Pharmacist [place] [facility] [phone]

## 2020-06-28 NOTE — Patient Instructions (Signed)
Leave off Benadryl or Diphenhydramine  Try over the counter antihistamine such as Claritan, Allegra, or Zyrtec  Also would recommend over the counter Flonase (Fluticasone nasal).

## 2020-06-28 NOTE — Chronic Care Management (AMB) (Signed)
    Chronic Care Management Pharmacy Assistant   Name: Drew Smith  MRN: [id number] DOB: 10-Apr-1940  Left voicemail reminder of patients appointment on 06/29/20 at 11am in office with Jeni Salles clinical pharmacist. Advised patient to bring all medications as well as any blood pressure or blood glucose readings to appointment.  [place] [phone]

## 2020-06-29 ENCOUNTER — Ambulatory Visit (INDEPENDENT_AMBULATORY_CARE_PROVIDER_SITE_OTHER): Payer: Medicare Other | Admitting: Pharmacist

## 2020-06-29 VITALS — BP 142/70

## 2020-06-29 DIAGNOSIS — I10 Essential (primary) hypertension: Secondary | ICD-10-CM

## 2020-06-29 DIAGNOSIS — H2513 Age-related nuclear cataract, bilateral: Secondary | ICD-10-CM | POA: Diagnosis not present

## 2020-06-29 DIAGNOSIS — J309 Allergic rhinitis, unspecified: Secondary | ICD-10-CM

## 2020-06-29 NOTE — Patient Instructions (Addendum)
   Limit Sinex use to 3 days due to rebound congestion and it can raise your blood pressure   Use Xyzal (levocetrizine) or Zyrtec or Allegra or Claritin daily for congestion (do not take 2 of them in the same day)   Avoid the pink pill (diphenhydramine or Benadryl) due to risk of falls   Purchase an arm blood pressure cuff and check once a week at home   Take amlodipine every night

## 2020-07-03 ENCOUNTER — Other Ambulatory Visit: Payer: Self-pay | Admitting: Family Medicine

## 2020-07-03 MED ORDER — HYDROCHLOROTHIAZIDE 12.5 MG PO CAPS: 12.5000 mg | ORAL_CAPSULE | Freq: Every day | ORAL | 3 refills | Status: DC

## 2020-07-03 MED ORDER — AMLODIPINE BESYLATE 5 MG PO TABS: 5.0000 mg | ORAL_TABLET | Freq: Every day | ORAL | 3 refills | Status: DC

## 2020-07-04 ENCOUNTER — Other Ambulatory Visit: Payer: Self-pay

## 2020-07-04 ENCOUNTER — Ambulatory Visit: Payer: Medicare Other | Attending: Family Medicine | Admitting: Physical Therapy

## 2020-07-04 VITALS — BP 142/72

## 2020-07-04 DIAGNOSIS — G8929 Other chronic pain: Secondary | ICD-10-CM

## 2020-07-04 DIAGNOSIS — M25611 Stiffness of right shoulder, not elsewhere classified: Secondary | ICD-10-CM

## 2020-07-04 DIAGNOSIS — M25511 Pain in right shoulder: Secondary | ICD-10-CM | POA: Diagnosis not present

## 2020-07-04 DIAGNOSIS — M6281 Muscle weakness (generalized): Secondary | ICD-10-CM

## 2020-07-04 NOTE — Therapy (Signed)
[facility] [address] [place], [place], [address] Phone: [phone]   Fax:  [phone]  Physical Therapy Treatment  Patient Details  Name: Drew Smith MRN: [id number] Date of Birth: May 24, 1940 Referring Provider (PT): Dr. Elease Hashimoto   Encounter Date: 07/04/2020   PT End of Session - 07/04/20 1448    Visit Number 3    Date for PT Re-Evaluation 08/01/20    Authorization Type UHC MCR    Progress Note Due on Visit 10    PT Start Time 1400    PT Stop Time 1443    PT Time Calculation (min) 43 min    Activity Tolerance Patient tolerated treatment well           Past Medical History:  Diagnosis Date  . Arthritis   . Asthma   . Cancer [facility])    prostate  . GERD (gastroesophageal reflux disease)   . Guillain-Barre syndrome ([place]) 2000  . Hyperlipidemia   . Hypertension     Past Surgical History:  Procedure Laterality Date  . EYE SURGERY  2535   80 years old  . HERNIA REPAIR  1995  . NOSE SURGERY  1990  . PROSTATE SURGERY  2000   seed inplants  . SHOULDER SURGERY  2008    Vitals:   07/04/20 1406  BP: (!) 142/72     Subjective Assessment - 07/04/20 1407    Subjective Shoulder doing OK.  Played 1 round of golf and that made me hurt.  Tired at the end of the round. Back is doing OK.  I really want to get back to playing more golf.    Pertinent History prostrate CA/surgery, shoulder surgery.    Diagnostic tests MRI Severe L4-5 facet arthrosis with grade 1 anterolisthesis and  severe spinal stenosis; Mild spinal stenosis and mild-to-moderate lateral recess stenosis  at L3-4.    Currently in Pain? Yes    Pain Score 3     Pain Location Shoulder    Pain Type Chronic pain              [facility] PT Assessment - 07/04/20 0001      AROM   Right Shoulder Flexion 122 Degrees    Right Shoulder ABduction 112 Degrees    Right Shoulder External Rotation 55 Degrees    Left Shoulder Flexion 143 Degrees     Left Shoulder ABduction 127 Degrees    Left Shoulder Internal Rotation --   T10   Left Shoulder External Rotation 53 Degrees                         [facility] Adult PT Treatment/Exercise - 07/04/20 0001      Therapeutic Activites    Therapeutic Activities Other Therapeutic Activities    Other Therapeutic Activities red band golf 1/2 swings 10x2 each way      Neuro Re-ed    Neuro Re-ed Details  standing on black foam with red band press outs. press ups and rotating away 5x each      Shoulder Exercises: Prone   Other Prone Exercises bent over row - 3lb 2x10      Shoulder Exercises: Standing   Shoulder Elevation Limitations yellow band steering wheels 10x    Other Standing Exercises wall clocksyellow band 5x each side    Other Standing Exercises yellow band UE elevation on wall 10x      Shoulder Exercises: Stretch   Other Shoulder  Stretches thoracic extension with ball seated 15x                    PT Short Term Goals - 06/06/20 2253      PT SHORT TERM GOAL #1   Title Pt will be ind and compliant with initial HEP    Status Achieved             PT Long Term Goals - 07/04/20 1724      PT LONG TERM GOAL #1   Title Pt will be ind in advanced HEP for lumbar and LE flexibilty and shoulder mobility and strength    Time 8    Period Weeks    Status On-going    Target Date 08/01/20      PT LONG TERM GOAL #2   Title Pt will be able to play a round of golf with pain at a low intensity during and following    Time 8    Period Weeks    Status Revised      PT LONG TERM GOAL #3   Title Right shoulder elevation to 150 degrees needed for return to golf    Time 8    Period Weeks    Status Achieved      PT LONG TERM GOAL #4   Title Improved FOTO score for shoulder improved from 61 to 70%    Time 8    Period Weeks    Status On-going      PT LONG TERM GOAL #5   Title Right strength grossly 4+/5 needed for lifting the coffee pot    Time 8    Period Weeks     Status On-going                 Plan - 07/04/20 1409    Clinical Impression Statement The patient is able to advance the challenge of shoulder ex's including dynamic stabilization and golf simulation ex's.  He denies shoulder pain, just muscular fatigue of an appropriate level.  He also denies LBP with these ex's with therapist cueing for hip hinge method to reduce strain on  his low back.  Much improved left shoulder ROM.  Progressing with rehab goals.  He is eager to play more golf while he continues PT to test his ability for a smooth return.    Comorbidities anterolisthesis, spinal stenosis, left shoulder surgery; Jossie Ng 2000    Stability/Clinical Decision Making Stable/Uncomplicated    Rehab Potential Good    PT Frequency 2x / week    PT Duration 8 weeks    PT Treatment/Interventions ADLs/Self Care Home Management;Electrical Stimulation;Iontophoresis 4mg /ml Dexamethasone;Therapeutic exercise;Therapeutic activities;Neuromuscular re-education;Manual techniques;Patient/family education;Dry needling    PT Next Visit Plan Monitor BP every visit; bent rows, shoulder extensions, horizontal abduction ; ex to aid return to golf    PT Home Exercise Plan [id number]           Patient will benefit from skilled therapeutic intervention in order to improve the following deficits and impairments:  Decreased range of motion,Pain,Increased muscle spasms,Decreased activity tolerance,Impaired flexibility,Postural dysfunction,Decreased mobility  Visit Diagnosis: Chronic right shoulder pain  Muscle weakness (generalized)  Stiffness of right shoulder, not elsewhere classified     Problem List Patient Active Problem List   Diagnosis Date Noted  . Malignant melanoma of left forearm ([place]) 10/19/2019  . Upper respiratory tract infection 02/14/2017  . Porokeratosis 09/07/2014  . Metatarsal deformity 09/07/2014  . Pain in lower limb 09/07/2014  .  Squamous cell carcinoma in situ of  skin 10/15/2013  . Hives 10/06/2012  . History of Guillain-Barre syndrome 03/07/2011  . Hypertension 11/28/2010  . Dyslipidemia 11/28/2010  . ERECTILE DYSFUNCTION 01/05/2008  . ASTHMA 10/28/2007  . GERD 10/28/2007  . DIZZINESS 10/28/2007  . COUGH 10/28/2007  . FASTING HYPERGLYCEMIA 10/28/2007  . PROSTATE CANCER, HX OF 10/28/2007   [name], PT 07/04/20 5:26 PM Phone: [phone] Fax: [phone] Alvera Singh 07/04/2020, 5:26 PM   [facility] [address] [place], [place], [address] Phone: [phone]   Fax:  [phone]  Name: Drew Smith MRN: [id number] Date of Birth: 1941-02-15

## 2020-07-11 ENCOUNTER — Encounter: Payer: Medicare Other | Admitting: Physical Therapy

## 2020-07-17 DIAGNOSIS — M5136 Other intervertebral disc degeneration, lumbar region: Secondary | ICD-10-CM | POA: Diagnosis not present

## 2020-07-17 DIAGNOSIS — M9904 Segmental and somatic dysfunction of sacral region: Secondary | ICD-10-CM | POA: Diagnosis not present

## 2020-07-17 DIAGNOSIS — M50322 Other cervical disc degeneration at C5-C6 level: Secondary | ICD-10-CM | POA: Diagnosis not present

## 2020-07-17 DIAGNOSIS — M9901 Segmental and somatic dysfunction of cervical region: Secondary | ICD-10-CM | POA: Diagnosis not present

## 2020-07-18 ENCOUNTER — Telehealth: Payer: Self-pay | Admitting: Family Medicine

## 2020-07-18 ENCOUNTER — Other Ambulatory Visit: Payer: Self-pay

## 2020-07-18 ENCOUNTER — Ambulatory Visit: Payer: Medicare Other | Admitting: Physical Therapy

## 2020-07-18 VITALS — BP 138/76

## 2020-07-18 DIAGNOSIS — M25611 Stiffness of right shoulder, not elsewhere classified: Secondary | ICD-10-CM

## 2020-07-18 DIAGNOSIS — M25511 Pain in right shoulder: Secondary | ICD-10-CM

## 2020-07-18 DIAGNOSIS — M6281 Muscle weakness (generalized): Secondary | ICD-10-CM | POA: Diagnosis not present

## 2020-07-18 DIAGNOSIS — G8929 Other chronic pain: Secondary | ICD-10-CM | POA: Diagnosis not present

## 2020-07-18 MED ORDER — OMEPRAZOLE 20 MG PO CPDR: 1.0000 | DELAYED_RELEASE_CAPSULE | Freq: Two times a day (BID) | ORAL | 0 refills | Status: DC

## 2020-07-18 NOTE — Therapy (Signed)
[facility] [address] [place], [place], [address] Phone: [phone]   Fax:  [phone]  Physical Therapy Treatment  Patient Details  Name: Drew Smith MRN: [id number] Date of Birth: 07/26/1940 Referring Provider (PT): Dr. Elease Hashimoto   Encounter Date: 07/18/2020   PT End of Session - 07/18/20 1451    Visit Number 4    Date for PT Re-Evaluation 08/01/20    Authorization Type UHC MCR    Progress Note Due on Visit 10    PT Start Time 1400    PT Stop Time 1440    PT Time Calculation (min) 40 min    Activity Tolerance Patient tolerated treatment well           Past Medical History:  Diagnosis Date  . Arthritis   . Asthma   . Cancer [facility])    prostate  . GERD (gastroesophageal reflux disease)   . Guillain-Barre syndrome ([place]) 2000  . Hyperlipidemia   . Hypertension     Past Surgical History:  Procedure Laterality Date  . EYE SURGERY  4567   80 years old  . HERNIA REPAIR  1995  . NOSE SURGERY  1990  . PROSTATE SURGERY  2000   seed inplants  . SHOULDER SURGERY  2008    Vitals:   07/18/20 1403  BP: 138/76     Subjective Assessment - 07/18/20 1359    Subjective I played golf 3x last week.  I was surprised how well I did.    My back tightened up and I went to the chiropractor yesterday.   I still do modified push ups and the band ex's.    Pertinent History prostrate CA/surgery, shoulder surgery.    Diagnostic tests MRI Severe L4-5 facet arthrosis with grade 1 anterolisthesis and  severe spinal stenosis; Mild spinal stenosis and mild-to-moderate lateral recess stenosis  at L3-4.    Currently in Pain? Yes    Pain Score 1     Pain Location Shoulder    Pain Orientation Right    Pain Descriptors / Indicators Aching    Pain Type Chronic pain    Pain Frequency Intermittent                             [facility] Adult PT Treatment/Exercise - 07/18/20 0001      Therapeutic Activites     Other Therapeutic Activities red band golf 1/2 swings 10x2 each way standing on black foam single arm      Shoulder Exercises: Standing   Extension Strengthening;Right;10 reps;Left    Theraband Level (Shoulder Extension) Level 3 (Green)    Row Strengthening;Both;20 reps;Theraband;Right;Left    Theraband Level (Shoulder Row) Level 3 (Green)    Diagonals Limitations staggered stand on black foam red band press forward and up 5x each right/left    Shoulder Elevation Limitations yellow band steering wheels 10x    Other Standing Exercises wall clocksyellow band 5x each side    Other Standing Exercises yellow band UE elevation on wall 10x      Shoulder Exercises: ROM/Strengthening   Other ROM/Strengthening Exercises 3# snatch and press overhead 5x right/left    Other ROM/Strengthening Exercises counter top push ups; opp shoulder taps 10x each      Shoulder Exercises: Stretch   Other Shoulder Stretches lying on pool noodle vertically and horizontally with arms in T position 3x each  PT Short Term Goals - 06/06/20 2253      PT SHORT TERM GOAL #1   Title Pt will be ind and compliant with initial HEP    Status Achieved             PT Long Term Goals - 07/04/20 1724      PT LONG TERM GOAL #1   Title Pt will be ind in advanced HEP for lumbar and LE flexibilty and shoulder mobility and strength    Time 8    Period Weeks    Status On-going    Target Date 08/01/20      PT LONG TERM GOAL #2   Title Pt will be able to play a round of golf with pain at a low intensity during and following    Time 8    Period Weeks    Status Revised      PT LONG TERM GOAL #3   Title Right shoulder elevation to 150 degrees needed for return to golf    Time 8    Period Weeks    Status Achieved      PT LONG TERM GOAL #4   Title Improved FOTO score for shoulder improved from 61 to 70%    Time 8    Period Weeks    Status On-going      PT LONG TERM GOAL #5   Title  Right strength grossly 4+/5 needed for lifting the coffee pot    Time 8    Period Weeks    Status On-going                 Plan - 07/18/20 1452    Clinical Impression Statement The patient is able to perform a progression of scapular and shoulder strengthening needed for his goal of returning to golf several times per week including standon soft surface  as if  on grass/swinging a club.  Verbal and tactile cues needed for hip hinge.  Therapist monitoring response throughout session and providing close supervision while standing on soft surface for safety.  No loss of balance.    Comorbidities anterolisthesis, spinal stenosis, left shoulder surgery; Jossie Ng 2000    Rehab Potential Good    PT Frequency 2x / week    PT Duration 8 weeks    PT Treatment/Interventions ADLs/Self Care Home Management;Electrical Stimulation;Iontophoresis 4mg /ml Dexamethasone;Therapeutic exercise;Therapeutic activities;Neuromuscular re-education;Manual techniques;Patient/family education;Dry needling    PT Next Visit Plan Monitor BP every visit; bent rows, shoulder extensions, horizontal abduction ; ex to aid return to golf;  possible ERO and then space visits to biweekly to monitor full return to golf    PT Home Exercise Plan [id number]           Patient will benefit from skilled therapeutic intervention in order to improve the following deficits and impairments:  Decreased range of motion,Pain,Increased muscle spasms,Decreased activity tolerance,Impaired flexibility,Postural dysfunction,Decreased mobility  Visit Diagnosis: Chronic right shoulder pain  Muscle weakness (generalized)  Stiffness of right shoulder, not elsewhere classified     Problem List Patient Active Problem List   Diagnosis Date Noted  . Malignant melanoma of left forearm ([place]) 10/19/2019  . Upper respiratory tract infection 02/14/2017  . Porokeratosis 09/07/2014  . Metatarsal deformity 09/07/2014  . Pain in lower limb  09/07/2014  . Squamous cell carcinoma in situ of skin 10/15/2013  . Hives 10/06/2012  . History of Guillain-Barre syndrome 03/07/2011  . Hypertension 11/28/2010  . Dyslipidemia 11/28/2010  . ERECTILE DYSFUNCTION 01/05/2008  .  ASTHMA 10/28/2007  . GERD 10/28/2007  . DIZZINESS 10/28/2007  . COUGH 10/28/2007  . FASTING HYPERGLYCEMIA 10/28/2007  . PROSTATE CANCER, HX OF 10/28/2007   [name], PT 07/18/20 3:03 PM Phone: [phone] Fax: [phone] Alvera Singh 07/18/2020, 3:03 PM   [facility] [address] [place], [place], [address] Phone: [phone]   Fax:  [phone]  Name: Drew Smith MRN: [id number] Date of Birth: 09-20-1940

## 2020-07-18 NOTE — Telephone Encounter (Signed)
Rx sent in. Nothing further needed.

## 2020-07-18 NOTE — Telephone Encounter (Signed)
Pt call and stated he need a refill on  omeprazole (PRILOSEC) 20 MG capsule sent to  [facility] [address], [place] Phone:  [phone]  Fax:  [phone]

## 2020-07-25 ENCOUNTER — Other Ambulatory Visit: Payer: Self-pay

## 2020-07-25 ENCOUNTER — Ambulatory Visit: Payer: Medicare Other | Admitting: Physical Therapy

## 2020-07-25 VITALS — BP 137/75

## 2020-07-25 DIAGNOSIS — M25611 Stiffness of right shoulder, not elsewhere classified: Secondary | ICD-10-CM | POA: Diagnosis not present

## 2020-07-25 DIAGNOSIS — G8929 Other chronic pain: Secondary | ICD-10-CM | POA: Diagnosis not present

## 2020-07-25 DIAGNOSIS — M6281 Muscle weakness (generalized): Secondary | ICD-10-CM | POA: Diagnosis not present

## 2020-07-25 DIAGNOSIS — M25511 Pain in right shoulder: Secondary | ICD-10-CM | POA: Diagnosis not present

## 2020-07-25 NOTE — Therapy (Signed)
[facility] [address], [place], [place], [address] Phone: [phone]   Fax:  [phone]  Physical Therapy Treatment/Recertification   Patient Details  Name: Drew Smith MRN: [id number] Date of Birth: 1940/10/25 Referring Provider (PT): Dr. Elease Hashimoto   Encounter Date: 07/25/2020   PT End of Session - 07/25/20 1405    Visit Number 5    Date for PT Re-Evaluation 10/17/20    Authorization Type UHC MCR    Progress Note Due on Visit 10    PT Start Time 0867    PT Stop Time 1443    PT Time Calculation (min) 38 min    Activity Tolerance Patient tolerated treatment well           Past Medical History:  Diagnosis Date  . Arthritis   . Asthma   . Cancer [facility])    prostate  . GERD (gastroesophageal reflux disease)   . Guillain-Barre syndrome ([place]) 2000  . Hyperlipidemia   . Hypertension     Past Surgical History:  Procedure Laterality Date  . EYE SURGERY  6042   80 years old  . HERNIA REPAIR  1995  . NOSE SURGERY  1990  . PROSTATE SURGERY  2000   seed inplants  . SHOULDER SURGERY  2008    Vitals:   07/25/20 1409  BP: 137/75     Subjective Assessment - 07/25/20 1411    Subjective I'm not sure how I'm doing.  Played golf yesterday and that went well.   I had a great shot and won $68.  I used to clean the spots off the floor but I don't know if I can do that so I haven't tried.    Pertinent History prostrate CA/surgery, shoulder surgery.    Diagnostic tests MRI Severe L4-5 facet arthrosis with grade 1 anterolisthesis and  severe spinal stenosis; Mild spinal stenosis and mild-to-moderate lateral recess stenosis  at L3-4.    Currently in Pain? No/denies    Pain Score 0-No pain    Pain Location Shoulder    Pain Orientation Right              [facility] PT Assessment - 07/25/20 0001      Observation/Other Assessments   Observations 105    Focus on Therapeutic Outcomes (FOTO)  67%      AROM    Right Shoulder Flexion 105 Degrees    Right Shoulder ABduction 102 Degrees    Right Shoulder Internal Rotation --   T10   Right Shoulder External Rotation 50 Degrees    Left Shoulder Flexion 145 Degrees    Left Shoulder ABduction 132 Degrees      Strength   Right Shoulder Flexion 4/5    Right Shoulder Extension 4+/5    Right Shoulder ABduction 4/5    Right Shoulder Internal Rotation 4+/5    Right Shoulder External Rotation 4+/5    Right Shoulder Horizontal ABduction 4/5    Right Shoulder Horizontal ADduction 4/5                         [facility] Adult PT Treatment/Exercise - 07/25/20 0001      Shoulder Exercises: Seated   Other Seated Exercises thoracic extension with ball behind back 15x elbows out      Shoulder Exercises: Standing   Other Standing Exercises holding bil power tower handles with back lunge 5x right/left cues for front knee bend  Shoulder Exercises: ROM/Strengthening   Ranger L 15 flexion and scaption 20x right/left    Other ROM/Strengthening Exercises 5# carrying/reaching to eye level and above head level    Other ROM/Strengthening Exercises below counter top push ups 15x      Shoulder Exercises: Power Warden/ranger Exercises lat bar 25# with cue to pull just below chin instead of in front of the nose 15x and tactile cues to activate lats                   PT Short Term Goals - 07/25/20 1710      PT SHORT TERM GOAL #1   Title Pt will be ind and compliant with initial HEP    Status Achieved             PT Long Term Goals - 07/25/20 1711      PT LONG TERM GOAL #1   Title Pt will be ind in advanced HEP for lumbar and LE flexibilty and shoulder mobility and strength    Time 12    Status On-going    Target Date 10/17/20      PT LONG TERM GOAL #2   Title Pt will be able to play a round of golf with pain at a low intensity during and following    Time 12    Period Weeks    Status On-going      PT LONG TERM GOAL  #3   Title Right shoulder elevation to 150 degrees needed for return to golf    Time 12    Period Weeks    Status On-going      PT LONG TERM GOAL #4   Title Improved FOTO score for shoulder improved from 61 to 70%    Time 12    Period Weeks    Status On-going      PT LONG TERM GOAL #5   Title Right shoulder and scapular strength grossly 4+/5 to 5-/5 needed for driving the golf ball and getting up off the floor    Time 12    Period Weeks    Status Revised                 Plan - 07/25/20 1700    Clinical Impression Statement The patient reports decreasing shoulder pain and is now able to lift the coffee pot with little difficulty.  His FOTO score has improved from 61% to 67%.  His shoulder flexion and abduction ROM is limited however he has been able to play some golf.  He reports difficulty with driving the ball.  His shoulder and scapular strength have improved but with deficits that would affect driving the ball.  He has not returned to scrubbing the floor as he used to do secondary to concern with getting up off the floor.  He would benefit from reduced frequency to every other week for approx 5-6 more visits to ensure successful return to golf and other previous functional activities.    Comorbidities anterolisthesis, spinal stenosis, left shoulder surgery; Jossie Ng 2000    Rehab Potential Good    PT Frequency Biweekly    PT Duration 12 weeks    PT Treatment/Interventions ADLs/Self Care Home Management;Electrical Stimulation;Iontophoresis 4mg /ml Dexamethasone;Therapeutic exercise;Therapeutic activities;Neuromuscular re-education;Manual techniques;Patient/family education;Dry needling    PT Next Visit Plan Monitor BP every visit; exercise for continued return to golf;  ex's to aide getting up off the floor/scrub floor    PT Home Exercise  Plan [id number]           Patient will benefit from skilled therapeutic intervention in order to improve the following deficits and  impairments:  Decreased range of motion,Pain,Increased muscle spasms,Decreased activity tolerance,Impaired flexibility,Postural dysfunction,Decreased mobility  Visit Diagnosis: Chronic right shoulder pain - Plan: PT plan of care cert/re-cert  Muscle weakness (generalized) - Plan: PT plan of care cert/re-cert  Stiffness of right shoulder, not elsewhere classified - Plan: PT plan of care cert/re-cert     Problem List Patient Active Problem List   Diagnosis Date Noted  . Malignant melanoma of left forearm ([place]) 10/19/2019  . Upper respiratory tract infection 02/14/2017  . Porokeratosis 09/07/2014  . Metatarsal deformity 09/07/2014  . Pain in lower limb 09/07/2014  . Squamous cell carcinoma in situ of skin 10/15/2013  . Hives 10/06/2012  . History of Guillain-Barre syndrome 03/07/2011  . Hypertension 11/28/2010  . Dyslipidemia 11/28/2010  . ERECTILE DYSFUNCTION 01/05/2008  . ASTHMA 10/28/2007  . GERD 10/28/2007  . DIZZINESS 10/28/2007  . COUGH 10/28/2007  . FASTING HYPERGLYCEMIA 10/28/2007  . PROSTATE CANCER, HX OF 10/28/2007   [name], PT 07/25/20 5:15 PM Phone: [phone] Fax: [phone] Alvera Singh 07/25/2020, 5:14 PM  [facility] [address], [place], [place], [address] Phone: [phone]   Fax:  [phone]  Name: GAY RAPE MRN: [id number] Date of Birth: 10/14/1940

## 2020-08-01 ENCOUNTER — Ambulatory Visit: Payer: Medicare Other | Admitting: Physical Therapy

## 2020-08-03 DIAGNOSIS — H18413 Arcus senilis, bilateral: Secondary | ICD-10-CM | POA: Diagnosis not present

## 2020-08-03 DIAGNOSIS — H25043 Posterior subcapsular polar age-related cataract, bilateral: Secondary | ICD-10-CM | POA: Diagnosis not present

## 2020-08-03 DIAGNOSIS — H25013 Cortical age-related cataract, bilateral: Secondary | ICD-10-CM | POA: Diagnosis not present

## 2020-08-03 DIAGNOSIS — H2511 Age-related nuclear cataract, right eye: Secondary | ICD-10-CM | POA: Diagnosis not present

## 2020-08-03 DIAGNOSIS — H2513 Age-related nuclear cataract, bilateral: Secondary | ICD-10-CM | POA: Diagnosis not present

## 2020-08-08 ENCOUNTER — Other Ambulatory Visit: Payer: Self-pay

## 2020-08-08 ENCOUNTER — Ambulatory Visit: Payer: Medicare Other | Attending: Family Medicine | Admitting: Physical Therapy

## 2020-08-08 DIAGNOSIS — G8929 Other chronic pain: Secondary | ICD-10-CM

## 2020-08-08 DIAGNOSIS — M25511 Pain in right shoulder: Secondary | ICD-10-CM | POA: Diagnosis not present

## 2020-08-08 DIAGNOSIS — M25611 Stiffness of right shoulder, not elsewhere classified: Secondary | ICD-10-CM | POA: Diagnosis not present

## 2020-08-08 DIAGNOSIS — M6281 Muscle weakness (generalized): Secondary | ICD-10-CM

## 2020-08-08 NOTE — Therapy (Signed)
[facility] [address] [place], [place], [address] Phone: [phone]   Fax:  [phone]  Physical Therapy Treatment  Patient Details  Name: Drew Smith MRN: [id number] Date of Birth: [birth year] Referring Provider (PT): Dr. Elease Hashimoto   Encounter Date: 08/08/2020   PT End of Session - 08/08/20 1405     Visit Number 6    Date for PT Re-Evaluation 10/17/20    Authorization Type UHC MCR    Progress Note Due on Visit 10    PT Start Time 6283    PT Stop Time 1436    PT Time Calculation (min) 40 min    Activity Tolerance Patient tolerated treatment well    Behavior During Therapy [facility] for tasks assessed/performed             Past Medical History:  Diagnosis Date   Arthritis    Asthma    Cancer ([place])    prostate   GERD (gastroesophageal reflux disease)    Guillain-Barre syndrome ([place]) 2000   Hyperlipidemia    Hypertension     Past Surgical History:  Procedure Laterality Date   EYE SURGERY  6156   80 years old   [place]   seed inplants   SHOULDER SURGERY  2008    There were no vitals filed for this visit.   Subjective Assessment - 08/08/20 1400     Subjective Pt blood pressure was 155/82 at beginning due to being stressed at the [facility].    Pertinent History prostrate CA/surgery, shoulder surgery.    Patient Stated Goals get back to golf    Currently in Pain? No/denies                               [facility] Adult PT Treatment/Exercise - 08/08/20 0001       Therapeutic Activites    Other Therapeutic Activities yellow weighted ball "golf swings" in slow motion 10x2 each way standing on black foam single arm      Neuro Re-ed    Neuro Re-ed Details  ribcage breathing educated and practice in seated      Lumbar Exercises: Standing   Other Standing Lumbar Exercises push up on elevated mat table - 20x      Shoulder  Exercises: Seated   Other Seated Exercises thoracic extension with ball behind back 15x elbows out      Shoulder Exercises: Standing   Other Standing Exercises holding eliptical and sliders side and back 10x each side      Shoulder Exercises: ROM/Strengthening   Ranger L 15 flexion and scaption 20x right/left    Other ROM/Strengthening Exercises below counter top push ups 15x                      PT Short Term Goals - 07/25/20 1710       PT SHORT TERM GOAL #1   Title Pt will be ind and compliant with initial HEP    Status Achieved               PT Long Term Goals - 07/25/20 1711       PT LONG TERM GOAL #1   Title Pt will be ind in advanced HEP for lumbar and LE flexibilty and shoulder mobility and strength    Time 12  Status On-going    Target Date 10/17/20      PT LONG TERM GOAL #2   Title Pt will be able to play a round of golf with pain at a low intensity during and following    Time 12    Period Weeks    Status On-going      PT LONG TERM GOAL #3   Title Right shoulder elevation to 150 degrees needed for return to golf    Time 12    Period Weeks    Status On-going      PT LONG TERM GOAL #4   Title Improved FOTO score for shoulder improved from 61 to 70%    Time 12    Period Weeks    Status On-going      PT LONG TERM GOAL #5   Title Right shoulder and scapular strength grossly 4+/5 to 5-/5 needed for driving the golf ball and getting up off the floor    Time 12    Period Weeks    Status Revised                   Plan - 08/08/20 1448     Clinical Impression Statement Pt did well with exercises and no increased pain.  His BP was down after exercises today.  Pt was able to perform mini lunges with sliders today but needed cues to keep weight aligned on non-sliding leg.  Pt was educated on diaphragmatic breathing for increased ribcage mobility and he did well with cues to keep shoulders relaxed.  Pt recommended to continue to  progress towards functional goals.    Comorbidities anterolisthesis, spinal stenosis, left shoulder surgery; Jossie Ng 2000    PT Treatment/Interventions ADLs/Self Care Home Management;Electrical Stimulation;Iontophoresis 4mg /ml Dexamethasone;Therapeutic exercise;Therapeutic activities;Neuromuscular re-education;Manual techniques;Patient/family education;Dry needling    PT Next Visit Plan Monitor BP every visit; exercise for continued return to golf;  ex's to aide getting up off the floor/scrub floor    PT Home Exercise Plan [id number]    Consulted and Agree with Plan of Care Patient             Patient will benefit from skilled therapeutic intervention in order to improve the following deficits and impairments:  Decreased range of motion, Pain, Increased muscle spasms, Decreased activity tolerance, Impaired flexibility, Postural dysfunction, Decreased mobility  Visit Diagnosis: Chronic right shoulder pain  Muscle weakness (generalized)  Stiffness of right shoulder, not elsewhere classified     Problem List Patient Active Problem List   Diagnosis Date Noted   Malignant melanoma of left forearm ([place]) 10/19/2019   Upper respiratory tract infection 02/14/2017   Porokeratosis 09/07/2014   Metatarsal deformity 09/07/2014   Pain in lower limb 09/07/2014   Squamous cell carcinoma in situ of skin 10/15/2013   Hives 10/06/2012   History of Guillain-Barre syndrome 03/07/2011   Hypertension 11/28/2010   Dyslipidemia 11/28/2010   ERECTILE DYSFUNCTION 01/05/2008   ASTHMA 10/28/2007   GERD 10/28/2007   DIZZINESS 10/28/2007   COUGH 10/28/2007   FASTING HYPERGLYCEMIA 10/28/2007   PROSTATE CANCER, HX OF 10/28/2007    Camillo Flaming Ival Basquez, PT 08/08/2020, 2:54 PM  [facility] [address] [place], [place], [address] Phone: [phone]   Fax:  [phone]  Name: Drew Smith MRN: [id number] Date of Birth:  [birth year]

## 2020-08-21 DIAGNOSIS — M9901 Segmental and somatic dysfunction of cervical region: Secondary | ICD-10-CM | POA: Diagnosis not present

## 2020-08-21 DIAGNOSIS — M50321 Other cervical disc degeneration at C4-C5 level: Secondary | ICD-10-CM | POA: Diagnosis not present

## 2020-08-21 DIAGNOSIS — M9902 Segmental and somatic dysfunction of thoracic region: Secondary | ICD-10-CM | POA: Diagnosis not present

## 2020-08-21 DIAGNOSIS — M5134 Other intervertebral disc degeneration, thoracic region: Secondary | ICD-10-CM | POA: Diagnosis not present

## 2020-08-22 ENCOUNTER — Other Ambulatory Visit: Payer: Self-pay

## 2020-08-22 ENCOUNTER — Encounter: Payer: Self-pay | Admitting: Physical Therapy

## 2020-08-22 ENCOUNTER — Ambulatory Visit: Payer: Medicare Other | Admitting: Physical Therapy

## 2020-08-22 DIAGNOSIS — M25611 Stiffness of right shoulder, not elsewhere classified: Secondary | ICD-10-CM | POA: Diagnosis not present

## 2020-08-22 DIAGNOSIS — M6281 Muscle weakness (generalized): Secondary | ICD-10-CM | POA: Diagnosis not present

## 2020-08-22 DIAGNOSIS — M25511 Pain in right shoulder: Secondary | ICD-10-CM | POA: Diagnosis not present

## 2020-08-22 DIAGNOSIS — G8929 Other chronic pain: Secondary | ICD-10-CM | POA: Diagnosis not present

## 2020-08-22 NOTE — Therapy (Signed)
[facility] [address], [place], [place], [address] Phone: [phone]   Fax:  [phone]  Physical Therapy Treatment  Patient Details  Name: Drew Smith MRN: [id number] Date of Birth: 1940/03/06 Referring Provider (PT): Dr. Elease Hashimoto   Encounter Date: 08/22/2020   PT End of Session - 08/22/20 1400     Visit Number 7    Date for PT Re-Evaluation 10/17/20    Authorization Type UHC MCR    Progress Note Due on Visit 10    PT Start Time 1400    PT Stop Time 1440    PT Time Calculation (min) 40 min    Activity Tolerance Patient tolerated treatment well    Behavior During Therapy [facility] for tasks assessed/performed             Past Medical History:  Diagnosis Date   Arthritis    Asthma    Cancer ([place])    prostate   GERD (gastroesophageal reflux disease)    Guillain-Barre syndrome ([place]) 2000   Hyperlipidemia    Hypertension     Past Surgical History:  Procedure Laterality Date   EYE SURGERY  7372   80 years old   [place]   seed inplants   SHOULDER SURGERY  2008    There were no vitals filed for this visit.   Subjective Assessment - 08/22/20 1405     Subjective Pt states shoulder was hurting when playing golf last Friday but today it is better.  He went to the chiropractor yesterday. BP 129/72 and HR 72.    Pertinent History prostrate CA/surgery, shoulder surgery.    Diagnostic tests MRI Severe L4-5 facet arthrosis with grade 1 anterolisthesis and  severe spinal stenosis; Mild spinal stenosis and mild-to-moderate lateral recess stenosis  at L3-4.    Patient Stated Goals get back to golf    Currently in Pain? No/denies                               [facility] Adult PT Treatment/Exercise - 08/22/20 0001       Shoulder Exercises: Seated   External Rotation Strengthening;Both;15 reps;Theraband    Theraband Level  (Shoulder External Rotation) Level 3 (Green)    Other Seated Exercises thoracic extension with ball behind back 15x hands behind head; extension with rotation hands behind head      Shoulder Exercises: Standing   Flexion Strengthening;Both;Weights    Shoulder Flexion Weight (lbs) 2    Diagonals Strengthening;Both;20 reps    Theraband Level (Shoulder Diagonals) Level 2 (Red)    Diagonals Limitations D1/D2 diagonals both ways - bil hands holding band      Shoulder Exercises: ROM/Strengthening   UBE (Upper Arm Bike) L1 x 5 min PT present for status update    Lat Pull 20 reps    Lat Pull Limitations power tower 15 lb                      PT Short Term Goals - 07/25/20 1710       PT SHORT TERM GOAL #1   Title Pt will be ind and compliant with initial HEP    Status Achieved               PT Long Term Goals - 07/25/20 1711  PT LONG TERM GOAL #1   Title Pt will be ind in advanced HEP for lumbar and LE flexibilty and shoulder mobility and strength    Time 12    Status On-going    Target Date 10/17/20      PT LONG TERM GOAL #2   Title Pt will be able to play a round of golf with pain at a low intensity during and following    Time 12    Period Weeks    Status On-going      PT LONG TERM GOAL #3   Title Right shoulder elevation to 150 degrees needed for return to golf    Time 12    Period Weeks    Status On-going      PT LONG TERM GOAL #4   Title Improved FOTO score for shoulder improved from 61 to 70%    Time 12    Period Weeks    Status On-going      PT LONG TERM GOAL #5   Title Right shoulder and scapular strength grossly 4+/5 to 5-/5 needed for driving the golf ball and getting up off the floor    Time 12    Period Weeks    Status Revised                   Plan - 08/22/20 1532     Clinical Impression Statement Today's session focused on multiplane shoulder strength with diagonals both ways.  He was monitored for form and working on  improved thoracic mobility. Pt was given thoracic extension and rotation stretch to add to HEP in order to continue to improve golf swing.  Pt will benefit from skilled PT to progress shoulder strength and stability as tolerated.    Comorbidities anterolisthesis, spinal stenosis, left shoulder surgery; Drew Smith 2000    PT Treatment/Interventions ADLs/Self Care Home Management;Electrical Stimulation;Iontophoresis 4mg /ml Dexamethasone;Therapeutic exercise;Therapeutic activities;Neuromuscular re-education;Manual techniques;Patient/family education;Dry needling    PT Next Visit Plan Monitor BP every visit; exercise for continued return to golf;  ex's to aide getting up off the floor/scrub floor    PT Home Exercise Plan [id number]    Consulted and Agree with Plan of Care Patient             Patient will benefit from skilled therapeutic intervention in order to improve the following deficits and impairments:  Decreased range of motion, Pain, Increased muscle spasms, Decreased activity tolerance, Impaired flexibility, Postural dysfunction, Decreased mobility  Visit Diagnosis: Chronic right shoulder pain  Muscle weakness (generalized)  Stiffness of right shoulder, not elsewhere classified     Problem List Patient Active Problem List   Diagnosis Date Noted   Malignant melanoma of left forearm ([place]) 10/19/2019   Upper respiratory tract infection 02/14/2017   Porokeratosis 09/07/2014   Metatarsal deformity 09/07/2014   Pain in lower limb 09/07/2014   Squamous cell carcinoma in situ of skin 10/15/2013   Hives 10/06/2012   History of Guillain-Barre syndrome 03/07/2011   Hypertension 11/28/2010   Dyslipidemia 11/28/2010   ERECTILE DYSFUNCTION 01/05/2008   ASTHMA 10/28/2007   GERD 10/28/2007   DIZZINESS 10/28/2007   COUGH 10/28/2007   FASTING HYPERGLYCEMIA 10/28/2007   PROSTATE CANCER, HX OF 10/28/2007    Drew Smith, PT 08/22/2020, 5:12 PM  [facility] [address] [place], [place], [address] Phone: [phone]   Fax:  [phone]  Name: [name] MRN: [id number] Date of Birth: 01/06/1941

## 2020-08-29 ENCOUNTER — Other Ambulatory Visit: Payer: Self-pay

## 2020-08-29 ENCOUNTER — Encounter: Payer: Self-pay | Admitting: Family Medicine

## 2020-08-29 ENCOUNTER — Ambulatory Visit (INDEPENDENT_AMBULATORY_CARE_PROVIDER_SITE_OTHER): Payer: Medicare Other | Admitting: Family Medicine

## 2020-08-29 VITALS — BP 110/70 | HR 75 | Temp 99.0°F | Wt 162.4 lb

## 2020-08-29 DIAGNOSIS — R4689 Other symptoms and signs involving appearance and behavior: Secondary | ICD-10-CM | POA: Diagnosis not present

## 2020-08-29 DIAGNOSIS — R413 Other amnesia: Secondary | ICD-10-CM

## 2020-08-29 DIAGNOSIS — R21 Rash and other nonspecific skin eruption: Secondary | ICD-10-CM | POA: Diagnosis not present

## 2020-08-29 NOTE — Progress Notes (Signed)
Established Patient Office Visit  Subjective:  Patient ID: Drew Smith, male    DOB: 03-Oct-1940  Age: 80 y.o. MRN: [id number]  CC:  Chief Complaint  Patient presents with   Rash    "Spot" on upper chest wall    Dry Eye   Altered Mental Status    HPI Drew Smith presents for several items as follows  He has erythematous "spot "upper chest just left of midline.  He states this is been approximately silver dollar size for several months.  He brings in a bag of several places tried including a couple of prescription steroid creams, Neosporin, and 5-fluorouracil which he apparently gotten from dermatologist sometime in the past.  Lesion is nonscaly and nontender.  He also has somewhat prominent sternum to the superior aspect.  Nontender.  No recent injury.  He has concerns about possible mild intermittent confusion.  For example, he states he was golfing with some friends recently and after that he had played they were having a drink together and he felt slightly confused very transiently.  No speech changes.  No focal weakness.  He specifically is concerned because of multiple concussions in his lifetime.  He played professional hockey for several years.  He states he had his first sports concussion around age 7 and estimates anywhere between 59 and 20 concussions in his lifetime.  No recent headaches.  Past Medical History:  Diagnosis Date   Arthritis    Asthma    Cancer ([place])    prostate   GERD (gastroesophageal reflux disease)    Guillain-Barre syndrome ([place]) 2000   Hyperlipidemia    Hypertension     Past Surgical History:  Procedure Laterality Date   EYE SURGERY  3758   80 years old   [place]  2000   seed inplants   SHOULDER SURGERY  2008    Family History  Problem Relation Age of Onset   Hyperlipidemia Brother    Stroke Brother    Diabetes Brother        type ll   Alcohol abuse Father     Social  History   Socioeconomic History   Marital status: Divorced    Spouse name: Not on file   Number of children: 3   Years of education: Not on file   Highest education level: Some college, no degree  Occupational History   Occupation: retired  Tobacco Use   Smoking status: Never   Smokeless tobacco: Never  Vaping Use   Vaping Use: Never used  Substance and Sexual Activity   Alcohol use: Yes    Alcohol/week: 7.0 standard drinks    Types: 7 Glasses of wine per week    Comment: have 1/2 a day; wine or other    Drug use: No   Sexual activity: Not Currently  Other Topics Concern   Not on file  Social History Narrative   Lives alone in one level. Has two sons 1 daughter and friends who serve as support.   Former Chiropodist; Enjoys Data processing manager, reading   Originally from [place]   Social Determinants of Health   Financial Resource Strain: Low Risk    Difficulty of Paying Living Expenses: Not hard at [employer] Insecurity: No Food Insecurity   Worried About Charity fundraiser in the Last Year: Never true   Arboriculturist in the Last Year: Never true  Transportation Needs: No Data processing manager (Medical): No   Lack of Transportation (Non-Medical): No  Physical Activity: Sufficiently Active   Days of Exercise per Week: 5 days   Minutes of Exercise per Session: 60 min  Stress: No Stress Concern Present   Feeling of Stress : Not at all  Social Connections: Moderately Integrated   Frequency of Communication with Friends and Family: More than three times a week   Frequency of Social Gatherings with Friends and Family: More than three times a week   Attends Religious Services: 1 to 4 times per year   Active Member of [employer] or Organizations: Yes   Attends Music therapist: More than 4 times per year   Marital Status: Divorced  Human resources officer Violence: Not At Risk   Fear of Current or Ex-Partner: No   Emotionally Abused: No   Physically  Abused: No   Sexually Abused: No    Outpatient Medications Prior to Visit  Medication Sig Dispense Refill   acyclovir (ZOVIRAX) 400 MG tablet TAKE 1 TABLET (400 MG TOTAL) BY MOUTH AS NEEDED. 90 tablet 2   amLODipine (NORVASC) 5 MG tablet Take 1 tablet (5 mg total) by mouth daily. 90 tablet 3   azelastine (ASTELIN) 0.1 % nasal spray Place 1 spray into both nostrils 2 (two) times daily. Use in each nostril as directed 30 mL 12   ciclopirox (LOPROX) 0.77 % cream APPLY TO AFFECTED AREA(S) TWO TIMES A DAY AS DIRECTED 15 g 0   desonide (DESOWEN) 0.05 % cream Apply 1 application topically 2 (two) times daily as needed.     fluorouracil (EFUDEX) 5 % cream Apply topically as needed. Per Dr Joaquim Lai     hydrochlorothiazide (MICROZIDE) 12.5 MG capsule Take 1 capsule (12.5 mg total) by mouth daily. 90 capsule 3   ibuprofen (ADVIL) 800 MG tablet Take 1 tablet (800 mg total) by mouth every 8 (eight) hours as needed. 30 tablet 0   Multiple Vitamins-Minerals (CENTRUM SILVER 50+MEN PO) Take 1 tablet by mouth daily as needed.     omeprazole (PRILOSEC) 20 MG capsule Take 1 capsule (20 mg total) by mouth 2 (two) times daily. 180 capsule 0   Oxymetazoline HCl (NASAL SPRAY) 0.05 % SOLN Place into the nose.     traMADol (ULTRAM) 50 MG tablet Take 50 mg by mouth every 6 (six) hours as needed.     tretinoin (RETIN-A) 0.05 % cream      triamcinolone cream (KENALOG) 0.1 % APPLY TO AFFECTED AREA(S) TWO TIMES A DAY AS DIRECTED 30 g 0   VITAMIN D, CHOLECALCIFEROL, PO Take 1 tablet by mouth every other day. 5000 units every other day     zinc gluconate 50 MG tablet Take 50 mg by mouth daily as needed.     gabapentin (NEURONTIN) 100 MG capsule Take one to two capsules by mouth at night as needed for back pain. (Patient not taking: No sig reported) 60 capsule 3   levocetirizine (XYZAL) 5 MG tablet TAKE ONE TABLET BY MOUTH EVERY EVENING (Patient not taking: No sig reported) 90 tablet 0   No facility-administered medications  prior to visit.    No Known Allergies  ROS Review of Systems  Constitutional:  Negative for appetite change, chills, fever and unexpected weight change.  Respiratory:  Negative for cough and shortness of breath.   Cardiovascular:  Negative for chest pain.  Gastrointestinal:  Negative for abdominal pain.  Neurological:  Negative for dizziness, seizures,  syncope, speech difficulty, weakness and headaches.  Psychiatric/Behavioral:         See HPI     Objective:    Physical Exam Vitals reviewed.  Constitutional:      Appearance: Normal appearance.  Cardiovascular:     Rate and Rhythm: Normal rate and regular rhythm.  Pulmonary:     Effort: Pulmonary effort is normal.     Breath sounds: Normal breath sounds.  Musculoskeletal:     Comments: Somewhat prominent superior aspect of his sternum but nontender.  Skin:    Comments: He has approximately 3 x 3 cm area of erythema which blanches with pressure upper anterior chest just left of midline.  Nonscaly.  No vesicles.  No pustules.  Nontender.  This is completely macular  Neurological:     General: No focal deficit present.     Mental Status: He is alert and oriented to person, place, and time.     Cranial Nerves: No cranial nerve deficit.     Motor: No weakness.     Gait: Gait normal.     Comments: Mini cog score of 5    BP 110/70 (BP Location: Left Arm, Patient Position: Sitting, Cuff Size: Normal)   Pulse 75   Temp 99 F (37.2 C) (Oral)   Wt 162 lb 6.4 oz (73.7 kg)   SpO2 97%   BMI 24.69 kg/m  Wt Readings from Last 3 Encounters:  08/29/20 162 lb 6.4 oz (73.7 kg)  06/28/20 159 lb 6.4 oz (72.3 kg)  06/01/20 162 lb (73.5 kg)     There are no preventive care reminders to display for this patient.  There are no preventive care reminders to display for this patient.  Lab Results  Component Value Date   TSH 2.96 04/05/2019   Lab Results  Component Value Date   WBC 7.9 02/09/2020   HGB 13.8 02/09/2020   HCT 40.4  02/09/2020   MCV 93.0 02/09/2020   PLT 231.0 02/09/2020   Lab Results  Component Value Date   NA 136 02/09/2020   K 4.1 02/09/2020   CO2 28 02/09/2020   GLUCOSE 146 (H) 02/09/2020   BUN 15 02/09/2020   CREATININE 0.89 02/09/2020   BILITOT 0.6 02/09/2020   ALKPHOS 70 02/09/2020   AST 12 02/09/2020   ALT 11 02/09/2020   PROT 6.5 02/09/2020   ALBUMIN 3.8 02/09/2020   CALCIUM 9.0 02/09/2020   GFR 80.97 02/09/2020   Lab Results  Component Value Date   CHOL 163 04/05/2019   Lab Results  Component Value Date   HDL 47.60 04/05/2019   Lab Results  Component Value Date   LDLCALC 95 04/05/2019   Lab Results  Component Value Date   TRIG 103.0 04/05/2019   Lab Results  Component Value Date   CHOLHDL 3 04/05/2019   Lab Results  Component Value Date   HGBA1C 5.8 (A) 06/28/2020      Assessment & Plan:   #1 concern for bony abnormality anterior chest with basically normal exam. -No further evaluation recommended  #2 erythematous area upper chest.  Patient has been applying Neosporin and 5-fluorouracil which could both be irritating.  We recommend he leave off all creams at this time and observe for now and be in touch if this is not going away in the next few weeks  #3 patient concern for cognitive impairment.  History of multiple concussions as above.  Mini cog score 5. -Recommend check TSH, B12, RPR -If above normal and  patient has ongoing concerns consider possible neurocognitive assessment with neuropsychologist  30 minutes were spent with patient discussing symptoms as above and assessing patient   No orders of the defined types were placed in this encounter.   Follow-up: No follow-ups on file.    [name], MD

## 2020-08-29 NOTE — Patient Instructions (Signed)
Leave off the Fluorouracil cream and Neosporin to skin spot on chest.   Let me know if redness not improving over next few weeks.

## 2020-08-30 LAB — TSH: TSH: 1.63 u[IU]/mL (ref 0.35–5.50)

## 2020-08-30 LAB — VITAMIN B12: Vitamin B-12: 304 pg/mL (ref 211–911)

## 2020-08-30 LAB — RPR: RPR Ser Ql: NONREACTIVE

## 2020-09-05 ENCOUNTER — Other Ambulatory Visit: Payer: Self-pay

## 2020-09-05 ENCOUNTER — Ambulatory Visit: Payer: Medicare Other | Attending: Family Medicine | Admitting: Physical Therapy

## 2020-09-05 DIAGNOSIS — G8929 Other chronic pain: Secondary | ICD-10-CM | POA: Insufficient documentation

## 2020-09-05 DIAGNOSIS — M25511 Pain in right shoulder: Secondary | ICD-10-CM | POA: Diagnosis not present

## 2020-09-05 DIAGNOSIS — M6281 Muscle weakness (generalized): Secondary | ICD-10-CM | POA: Insufficient documentation

## 2020-09-05 DIAGNOSIS — M25611 Stiffness of right shoulder, not elsewhere classified: Secondary | ICD-10-CM | POA: Diagnosis not present

## 2020-09-05 NOTE — Therapy (Signed)
[facility] [address], [place], [place], [address] Phone: [phone]   Fax:  [phone]  Physical Therapy Treatment  Patient Details  Name: Drew Smith MRN: [id number] Date of Birth: 1940/04/23 Referring Provider (PT): Dr. Elease Hashimoto   Encounter Date: 09/05/2020   PT End of Session - 09/05/20 1446     Visit Number 8    Date for PT Re-Evaluation 10/17/20    Authorization Type UHC MCR    Progress Note Due on Visit 10    PT Start Time 1400    PT Stop Time 1440    PT Time Calculation (min) 40 min    Activity Tolerance Patient tolerated treatment well             Past Medical History:  Diagnosis Date   Arthritis    Asthma    Cancer ([place])    prostate   GERD (gastroesophageal reflux disease)    Guillain-Barre syndrome ([place]) 2000   Hyperlipidemia    Hypertension     Past Surgical History:  Procedure Laterality Date   EYE SURGERY  5621   80 years old   [place]   seed inplants   SHOULDER SURGERY  2008    There were no vitals filed for this visit.   Subjective Assessment - 09/05/20 1400     Subjective There's a problem with my bill.  My golf game has not been very good lately.  My shoulder has been good lately.  I've been busy so far today.  The chiropractor works on neck and back.  136/75 HR 75    Pertinent History prostrate CA/surgery, shoulder surgery.    Diagnostic tests MRI Severe L4-5 facet arthrosis with grade 1 anterolisthesis and  severe spinal stenosis; Mild spinal stenosis and mild-to-moderate lateral recess stenosis  at L3-4.    Patient Stated Goals get back to golf    Currently in Pain? Yes    Pain Score 2     Pain Location Shoulder    Pain Orientation Right                [facility] PT Assessment - 09/05/20 0001       AROM   Right Shoulder Flexion 135 Degrees    Right Shoulder ABduction 140 Degrees    Right  Shoulder Internal Rotation --   T10   Right Shoulder External Rotation 54 Degrees    Left Shoulder Flexion 130 Degrees    Left Shoulder ABduction 160 Degrees    Left Shoulder Internal Rotation --   T10   Left Shoulder External Rotation 58 Degrees      Strength   Right Shoulder Flexion 4+/5    Right Shoulder Extension 4+/5    Right Shoulder ABduction 4/5    Right Shoulder Internal Rotation 4+/5    Right Shoulder External Rotation 4+/5    Right Shoulder Horizontal ABduction 4/5    Right Shoulder Horizontal ADduction 4/5                           [facility] Adult PT Treatment/Exercise - 09/05/20 0001       Shoulder Exercises: Seated   Other Seated Exercises thoracic extension with ball behind back 15x hands behind head; extension with rotation hands behind head      Shoulder Exercises: Standing   Other Standing Exercises green loop  around wrists slides up wall 10x    Other Standing Exercises hip hinge golf swing simulation with blue band 1 fast set, 2nd set slow 10x each      Shoulder Exercises: ROM/Strengthening   UBE (Upper Arm Bike) L1 3 min alternating directions    [place] L 16 flexion  20x right/left    Wall Pushups Limitations single arm 10x each side    Modified Plank Limitations standing walking hands out on mat table 8x    Other ROM/Strengthening Exercises 5# snatch and press to shoulder right/left 10x each side    Other ROM/Strengthening Exercises green band 3 way pull downs (flexion, scaption, abuction 10x each                      PT Short Term Goals - 07/25/20 1710       PT SHORT TERM GOAL #1   Title Pt will be ind and compliant with initial HEP    Status Achieved               PT Long Term Goals - 07/25/20 1711       PT LONG TERM GOAL #1   Title Pt will be ind in advanced HEP for lumbar and LE flexibilty and shoulder mobility and strength    Time 12    Status On-going    Target Date 10/17/20      PT LONG TERM GOAL #2    Title Pt will be able to play a round of golf with pain at a low intensity during and following    Time 12    Period Weeks    Status On-going      PT LONG TERM GOAL #3   Title Right shoulder elevation to 150 degrees needed for return to golf    Time 12    Period Weeks    Status On-going      PT LONG TERM GOAL #4   Title Improved FOTO score for shoulder improved from 61 to 70%    Time 12    Period Weeks    Status On-going      PT LONG TERM GOAL #5   Title Right shoulder and scapular strength grossly 4+/5 to 5-/5 needed for driving the golf ball and getting up off the floor    Time 12    Period Weeks    Status Revised                   Plan - 09/05/20 1446     Clinical Impression Statement Improving shoulder flexion and abduction ROM overall since start of care but also notable improvements today from the beginning of session to the end of session. Able to perform golf simulation exercises (picking up ball out of the hole and golf swing) as well as weight bearing ex's as requested to aid getting up off the floor with greater ease  Verbal cues for hip hinge technique with bending forward movements.  Therapist monitoring response throughout treatment session.  No increase in shoulder pain.    Comorbidities anterolisthesis, spinal stenosis, left shoulder surgery; Jossie Ng 2000    Rehab Potential Good    PT Frequency Biweekly    PT Duration 12 weeks    PT Treatment/Interventions ADLs/Self Care Home Management;Electrical Stimulation;Iontophoresis 4mg /ml Dexamethasone;Therapeutic exercise;Therapeutic activities;Neuromuscular re-education;Manual techniques;Patient/family education;Dry needling    PT Next Visit Plan Monitor BP every visit; exercise for continued return to golf;  ex's to aide getting up off  the floor/scrub floor    PT Home Exercise Plan [id number]             Patient will benefit from skilled therapeutic intervention in order to improve the following  deficits and impairments:  Decreased range of motion, Pain, Increased muscle spasms, Decreased activity tolerance, Impaired flexibility, Postural dysfunction, Decreased mobility  Visit Diagnosis: Chronic right shoulder pain  Muscle weakness (generalized)  Stiffness of right shoulder, not elsewhere classified     Problem List Patient Active Problem List   Diagnosis Date Noted   Malignant melanoma of left forearm ([place]) 10/19/2019   Upper respiratory tract infection 02/14/2017   Porokeratosis 09/07/2014   Metatarsal deformity 09/07/2014   Pain in lower limb 09/07/2014   Squamous cell carcinoma in situ of skin 10/15/2013   Hives 10/06/2012   History of Guillain-Barre syndrome 03/07/2011   Hypertension 11/28/2010   Dyslipidemia 11/28/2010   ERECTILE DYSFUNCTION 01/05/2008   ASTHMA 10/28/2007   GERD 10/28/2007   DIZZINESS 10/28/2007   COUGH 10/28/2007   FASTING HYPERGLYCEMIA 10/28/2007   PROSTATE CANCER, HX OF 10/28/2007   [name], PT 09/05/20 2:51 PM Phone: [phone] Fax: [phone]  Alvera Singh 09/05/2020, 2:51 PM  [facility] [address] [place], [place], [address] Phone: [phone]   Fax:  [phone]  Name: Drew Smith MRN: [id number] Date of Birth: 1940/07/24

## 2020-09-11 ENCOUNTER — Telehealth: Payer: Self-pay | Admitting: Family Medicine

## 2020-09-11 NOTE — Telephone Encounter (Signed)
Pt call and stated he want to know why was this medication denied . He stated dr.Burchette told him to use this one  Priancinolone aceponide and he want a call back to tell him why it was denied.

## 2020-09-11 NOTE — Telephone Encounter (Signed)
Please advise. I do not see this medication on the patients current med list. I also to not see any recent refill request. Please advise.

## 2020-09-12 MED ORDER — TRIAMCINOLONE ACETONIDE 0.1 % EX CREA: TOPICAL_CREAM | CUTANEOUS | 0 refills | Status: DC

## 2020-09-12 NOTE — Telephone Encounter (Signed)
Confirmed that this is the correct medication with the patient. He is aware refills have been sent in. Nothing further needed.

## 2020-09-19 ENCOUNTER — Encounter: Payer: Medicare Other | Admitting: Physical Therapy

## 2020-10-03 ENCOUNTER — Other Ambulatory Visit: Payer: Self-pay

## 2020-10-03 ENCOUNTER — Ambulatory Visit: Payer: Medicare Other | Attending: Family Medicine | Admitting: Physical Therapy

## 2020-10-03 DIAGNOSIS — G8929 Other chronic pain: Secondary | ICD-10-CM | POA: Diagnosis not present

## 2020-10-03 DIAGNOSIS — M6281 Muscle weakness (generalized): Secondary | ICD-10-CM | POA: Insufficient documentation

## 2020-10-03 DIAGNOSIS — M25511 Pain in right shoulder: Secondary | ICD-10-CM | POA: Diagnosis not present

## 2020-10-03 DIAGNOSIS — M25611 Stiffness of right shoulder, not elsewhere classified: Secondary | ICD-10-CM

## 2020-10-03 NOTE — Therapy (Addendum)
[facility] [address], [place], [place], [address] Phone: [phone]   Fax:  [phone]  Physical Therapy Treatment/Discharge Summary  Patient Details  Name: Drew Smith MRN: [id number] Date of Birth: [birth year] Referring Provider (PT): Dr. Elease Hashimoto   Encounter Date: 10/03/2020   PT End of Session - 10/03/20 1438     Visit Number 9    Date for PT Re-Evaluation 10/17/20    Authorization Type UHC MCR    Progress Note Due on Visit 10    PT Start Time 1400    PT Stop Time 1440    PT Time Calculation (min) 40 min    Activity Tolerance Patient tolerated treatment well             Past Medical History:  Diagnosis Date   Arthritis    Asthma    Cancer ([place])    prostate   GERD (gastroesophageal reflux disease)    Guillain-Barre syndrome ([place]) 2000   Hyperlipidemia    Hypertension     Past Surgical History:  Procedure Laterality Date   EYE SURGERY  221   80 years old   [place]   seed inplants   SHOULDER SURGERY  2008    There were no vitals filed for this visit.   Subjective Assessment - 10/03/20 1401     Subjective Played golf yesterday.    Doing a lot of exercises and my shoulder is improving.  I'm feeling good about it.  I think we need to ex on both sides.   BP 148/67  HR 72    Pertinent History prostrate CA/surgery, shoulder surgery.                [facility] PT Assessment - 10/03/20 0001       AROM   Right Shoulder Flexion 116 Degrees    Right Shoulder ABduction 110 Degrees    Right Shoulder Internal Rotation --   T10   Right Shoulder External Rotation 70 Degrees    Left Shoulder Flexion 145 Degrees    Left Shoulder ABduction 140 Degrees                           [facility] Adult PT Treatment/Exercise - 10/03/20 0001       Therapeutic Activites    Other Therapeutic Activities sumo squat to retrieve golf   ball from hole      Shoulder Exercises: Seated   Other Seated Exercises thoracic extension with ball behind back 15x hands behind head; extension with rotation hands behind head      Shoulder Exercises: Prone   Other Prone Exercises crawl on/off mat table 3x onto hands/knees      Shoulder Exercises: Standing   Row Both;20 reps;Theraband    Theraband Level (Shoulder Row) Level 4 (Blue)    Other Standing Exercises 25# diagonals with rotation 20x each way    Other Standing Exercises red band press outs 25x bil      Shoulder Exercises: ROM/Strengthening   UBE (Upper Arm Bike) L1 2 min alternating directions    Lat Pull 25 reps    Lat Pull Limitations standing power tower 25 lb    Pushups 15 reps    Pushups Limitations on counter    Modified Plank Limitations standing walking hands out on mat table 8x    Other ROM/Strengthening Exercises  5# snatch and press to shoulder right/left 10x each side    Other ROM/Strengthening Exercises 3# pulley standing 3 ways right/left                      PT Short Term Goals - 07/25/20 1710       PT SHORT TERM GOAL #1   Title Pt will be ind and compliant with initial HEP    Status Achieved               PT Long Term Goals - 07/25/20 1711       PT LONG TERM GOAL #1   Title Pt will be ind in advanced HEP for lumbar and LE flexibilty and shoulder mobility and strength    Time 12    Status On-going    Target Date 10/17/20      PT LONG TERM GOAL #2   Title Pt will be able to play a round of golf with pain at a low intensity during and following    Time 12    Period Weeks    Status On-going      PT LONG TERM GOAL #3   Title Right shoulder elevation to 150 degrees needed for return to golf    Time 12    Period Weeks    Status On-going      PT LONG TERM GOAL #4   Title Improved FOTO score for shoulder improved from 61 to 70%    Time 12    Period Weeks    Status On-going      PT LONG TERM GOAL #5   Title Right shoulder  and scapular strength grossly 4+/5 to 5-/5 needed for driving the golf ball and getting up off the floor    Time 12    Period Weeks    Status Revised                   Plan - 10/03/20 1442     Clinical Impression Statement The patient is able to perform a progression of shoulder and scapular strengthening ex's without complaint of pain.   Verbal and tactile cues to encourage muscle activation of lats and to limit compensatory strategies with counter push ups.   Discussed strategy for golf ball retrieval using a wide sumo type stance.   The patient's BP post visit is 140/67 (lower than arrival).    Comorbidities anterolisthesis, spinal stenosis, left shoulder surgery; Jossie Ng 2000    Rehab Potential Good    PT Frequency Biweekly    PT Duration 12 weeks    PT Treatment/Interventions ADLs/Self Care Home Management;Electrical Stimulation;Iontophoresis 106m/ml Dexamethasone;Therapeutic exercise;Therapeutic activities;Neuromuscular re-education;Manual techniques;Patient/family education;Dry needling    PT Next Visit Plan Monitor BP every visit; 10th visit progress note and ERO next visit; check progress toward goals    PT Home Exercise Plan [id number]    Consulted and Agree with Plan of Care Patient             Patient will benefit from skilled therapeutic intervention in order to improve the following deficits and impairments:  Decreased range of motion, Pain, Increased muscle spasms, Decreased activity tolerance, Impaired flexibility, Postural dysfunction, Decreased mobility  Visit Diagnosis: Chronic right shoulder pain  Muscle weakness (generalized)  Stiffness of right shoulder, not elsewhere classified   PHYSICAL THERAPY DISCHARGE SUMMARY  Visits from Start of Care: 9  Current functional level related to goals / functional outcomes: The patient requested to spread visits  out over long duration to ensure he could return if needed.  He cancelled all visits since  August.  Will discharge at this time.     Remaining deficits: As above   Education / Equipment: HEP   Patient agrees to discharge. Patient goals were partially met. Patient is being discharged due to not returning since the last visit.   Problem List Patient Active Problem List   Diagnosis Date Noted   Malignant melanoma of left forearm ([place]) 10/19/2019   Upper respiratory tract infection 02/14/2017   Porokeratosis 09/07/2014   Metatarsal deformity 09/07/2014   Pain in lower limb 09/07/2014   Squamous cell carcinoma in situ of skin 10/15/2013   Hives 10/06/2012   History of Guillain-Barre syndrome 03/07/2011   Hypertension 11/28/2010   Dyslipidemia 11/28/2010   ERECTILE DYSFUNCTION 01/05/2008   ASTHMA 10/28/2007   GERD 10/28/2007   DIZZINESS 10/28/2007   COUGH 10/28/2007   FASTING HYPERGLYCEMIA 10/28/2007   PROSTATE CANCER, HX OF 10/28/2007   [name], PT 10/03/20 4:39 PM Phone: [phone] Fax: [phone]  Alvera Singh 10/03/2020, 4:39 PM  [facility] [address], [place], [place], [address] Phone: [phone]   Fax:  [phone]  Name: JAMESPAUL SECRIST MRN: [id number] Date of Birth: 1940/11/22

## 2020-10-04 DIAGNOSIS — M9901 Segmental and somatic dysfunction of cervical region: Secondary | ICD-10-CM | POA: Diagnosis not present

## 2020-10-04 DIAGNOSIS — M5134 Other intervertebral disc degeneration, thoracic region: Secondary | ICD-10-CM | POA: Diagnosis not present

## 2020-10-04 DIAGNOSIS — M9902 Segmental and somatic dysfunction of thoracic region: Secondary | ICD-10-CM | POA: Diagnosis not present

## 2020-10-04 DIAGNOSIS — M50321 Other cervical disc degeneration at C4-C5 level: Secondary | ICD-10-CM | POA: Diagnosis not present

## 2020-10-10 ENCOUNTER — Other Ambulatory Visit: Payer: Self-pay

## 2020-10-11 ENCOUNTER — Ambulatory Visit (INDEPENDENT_AMBULATORY_CARE_PROVIDER_SITE_OTHER): Payer: Medicare Other | Admitting: Family Medicine

## 2020-10-11 ENCOUNTER — Encounter: Payer: Self-pay | Admitting: Family Medicine

## 2020-10-11 VITALS — BP 140/70 | HR 62 | Temp 97.9°F | Wt 162.9 lb

## 2020-10-11 DIAGNOSIS — R21 Rash and other nonspecific skin eruption: Secondary | ICD-10-CM

## 2020-10-11 DIAGNOSIS — S60459A Superficial foreign body of unspecified finger, initial encounter: Secondary | ICD-10-CM

## 2020-10-11 DIAGNOSIS — L821 Other seborrheic keratosis: Secondary | ICD-10-CM

## 2020-10-11 DIAGNOSIS — R058 Other specified cough: Secondary | ICD-10-CM | POA: Diagnosis not present

## 2020-10-11 MED ORDER — PREDNISONE 10 MG PO TABS: ORAL_TABLET | ORAL | 0 refills | Status: DC

## 2020-10-11 NOTE — Progress Notes (Signed)
Established Patient Office Visit  Subjective:  Patient ID: Drew Smith, male    DOB: [birth year]  Age: 80 y.o. MRN: [id number]  CC:  Chief Complaint  Patient presents with   Rash    HPI Drew Smith presents for multiple items as below  Concern for possible splinter right hand.  He was at a restaurant and ran his hand against the wooden rail and was aware of a splinter that went up underneath his fifth digit under the nail.  He thinks he may have a little bit of residual splinter.  Does not have any pain, swelling, drainage.  He has skin lesion medial aspect right elbow.  Scaly and raised.  No rapid growth.  He has dry cough over the past couple of weeks.  Denies any significant drainage.  No fever.  No dyspnea.  No pleuritic pain.  No active GERD symptoms.  No significant postnasal drip symptoms.  Still golfing regularly and has not limited activities.  Upper anterior chest rash.  This is erythematous and blanches with pressure.  Slightly pruritic.  He states he is tried multiple over-the-counter things and cannot recall all the creams he is tried on this.  This is nonscaly.  He does occasionally use sunblock but not regularly.  Rash is confined to the upper chest area.  Past Medical History:  Diagnosis Date   Arthritis    Asthma    Cancer ([place])    prostate   GERD (gastroesophageal reflux disease)    Guillain-Barre syndrome ([place]) 2000   Hyperlipidemia    Hypertension     Past Surgical History:  Procedure Laterality Date   EYE SURGERY  8415   80 years old   [place]  2000   seed inplants   SHOULDER SURGERY  2008    Family History  Problem Relation Age of Onset   Hyperlipidemia Brother    Stroke Brother    Diabetes Brother        type ll   Alcohol abuse Father     Social History   Socioeconomic History   Marital status: Divorced    Spouse name: Not on file   Number of children: 3   Years of  education: Not on file   Highest education level: Some college, no degree  Occupational History   Occupation: retired  Tobacco Use   Smoking status: Never   Smokeless tobacco: Never  Vaping Use   Vaping Use: Never used  Substance and Sexual Activity   Alcohol use: Yes    Alcohol/week: 7.0 standard drinks    Types: 7 Glasses of wine per week    Comment: have 1/2 a day; wine or other    Drug use: No   Sexual activity: Not Currently  Other Topics Concern   Not on file  Social History Narrative   Lives alone in one level. Has two sons 1 daughter and friends who serve as support.   Former Chiropodist; Enjoys Data processing manager, reading   Originally from [place]   Social Determinants of Health   Financial Resource Strain: Low Risk    Difficulty of Paying Living Expenses: Not hard at [employer] Insecurity: No Food Insecurity   Worried About Charity fundraiser in the Last Year: Never true   Arboriculturist in the Last Year: Never true  Transportation Needs: No Data processing manager (Medical):  No   Lack of Transportation (Non-Medical): No  Physical Activity: Sufficiently Active   Days of Exercise per Week: 5 days   Minutes of Exercise per Session: 60 min  Stress: No Stress Concern Present   Feeling of Stress : Not at all  Social Connections: Moderately Integrated   Frequency of Communication with Friends and Family: More than three times a week   Frequency of Social Gatherings with Friends and Family: More than three times a week   Attends Religious Services: 1 to 4 times per year   Active Member of [employer] or Organizations: Yes   Attends Music therapist: More than 4 times per year   Marital Status: Divorced  Human resources officer Violence: Not At Risk   Fear of Current or Ex-Partner: No   Emotionally Abused: No   Physically Abused: No   Sexually Abused: No    Outpatient Medications Prior to Visit  Medication Sig Dispense Refill   acyclovir  (ZOVIRAX) 400 MG tablet TAKE 1 TABLET (400 MG TOTAL) BY MOUTH AS NEEDED. 90 tablet 2   amLODipine (NORVASC) 5 MG tablet Take 1 tablet (5 mg total) by mouth daily. 90 tablet 3   azelastine (ASTELIN) 0.1 % nasal spray Place 1 spray into both nostrils 2 (two) times daily. Use in each nostril as directed 30 mL 12   ciclopirox (LOPROX) 0.77 % cream APPLY TO AFFECTED AREA(S) TWO TIMES A DAY AS DIRECTED 15 g 0   desonide (DESOWEN) 0.05 % cream Apply 1 application topically 2 (two) times daily as needed.     fluorouracil (EFUDEX) 5 % cream Apply topically as needed. Per Dr Joaquim Lai     gabapentin (NEURONTIN) 100 MG capsule Take one to two capsules by mouth at night as needed for back pain. 60 capsule 3   hydrochlorothiazide (MICROZIDE) 12.5 MG capsule Take 1 capsule (12.5 mg total) by mouth daily. 90 capsule 3   ibuprofen (ADVIL) 800 MG tablet Take 1 tablet (800 mg total) by mouth every 8 (eight) hours as needed. 30 tablet 0   levocetirizine (XYZAL) 5 MG tablet TAKE ONE TABLET BY MOUTH EVERY EVENING 90 tablet 0   Multiple Vitamins-Minerals (CENTRUM SILVER 50+MEN PO) Take 1 tablet by mouth daily as needed.     omeprazole (PRILOSEC) 20 MG capsule Take 1 capsule (20 mg total) by mouth 2 (two) times daily. 180 capsule 0   Oxymetazoline HCl (NASAL SPRAY) 0.05 % SOLN Place into the nose.     traMADol (ULTRAM) 50 MG tablet Take 50 mg by mouth every 6 (six) hours as needed.     tretinoin (RETIN-A) 0.05 % cream      triamcinolone cream (KENALOG) 0.1 % Apply to affected areas two times a day as directed. 30 g 0   VITAMIN D, CHOLECALCIFEROL, PO Take 1 tablet by mouth every other day. 5000 units every other day     zinc gluconate 50 MG tablet Take 50 mg by mouth daily as needed.     No facility-administered medications prior to visit.    No Known Allergies  ROS Review of Systems  Constitutional:  Negative for chills and fever.  Respiratory:  Positive for cough. Negative for shortness of breath and wheezing.    Cardiovascular:  Negative for chest pain, palpitations and leg swelling.  Skin:  Positive for rash.     Objective:    Physical Exam Vitals reviewed.  Constitutional:      Appearance: Normal appearance.  Cardiovascular:     Rate  and Rhythm: Normal rate and regular rhythm.  Pulmonary:     Effort: Pulmonary effort is normal.     Breath sounds: Normal breath sounds.  Skin:    Comments: Right fifth digit reveals very faint somewhat linear discoloration along the distal aspect the nail.  This may represent a very small splinter hemorrhage.  No clear foreign body noted.  It is possible that this represents very small retained foreign body but he has no erythema or swelling and nontender.  Medial right elbow 6 mm raised well demarcated scaly skin lesion consistent with seborrheic keratosis  Upper anterior chest he has area approximately 10 x 14 cm of erythema which blanches with pressure.  Nonscaly.  Does have a couple of small scattered pustular type lesions  Neurological:     Mental Status: He is alert.    BP 140/70 (BP Location: Left Arm, Patient Position: Sitting, Cuff Size: Normal)   Pulse 62   Temp 97.9 F (36.6 C) (Oral)   Wt 162 lb 14.4 oz (73.9 kg)   SpO2 97%   BMI 24.77 kg/m  Wt Readings from Last 3 Encounters:  10/11/20 162 lb 14.4 oz (73.9 kg)  08/29/20 162 lb 6.4 oz (73.7 kg)  06/28/20 159 lb 6.4 oz (72.3 kg)     Health Maintenance Due  Topic Date Due   COVID-19 Vaccine (1) Never done    There are no preventive care reminders to display for this patient.  Lab Results  Component Value Date   TSH 1.63 08/29/2020   Lab Results  Component Value Date   WBC 7.9 02/09/2020   HGB 13.8 02/09/2020   HCT 40.4 02/09/2020   MCV 93.0 02/09/2020   PLT 231.0 02/09/2020   Lab Results  Component Value Date   NA 136 02/09/2020   K 4.1 02/09/2020   CO2 28 02/09/2020   GLUCOSE 146 (H) 02/09/2020   BUN 15 02/09/2020   CREATININE 0.89 02/09/2020   BILITOT 0.6  02/09/2020   ALKPHOS 70 02/09/2020   AST 12 02/09/2020   ALT 11 02/09/2020   PROT 6.5 02/09/2020   ALBUMIN 3.8 02/09/2020   CALCIUM 9.0 02/09/2020   GFR 80.97 02/09/2020   Lab Results  Component Value Date   CHOL 163 04/05/2019   Lab Results  Component Value Date   HDL 47.60 04/05/2019   Lab Results  Component Value Date   LDLCALC 95 04/05/2019   Lab Results  Component Value Date   TRIG 103.0 04/05/2019   Lab Results  Component Value Date   CHOLHDL 3 04/05/2019   Lab Results  Component Value Date   HGBA1C 5.8 (A) 06/28/2020      Assessment & Plan:   #1 dry cough.  Nonfocal exam.  No respiratory distress.  Lung exam clear.  Question post viral. -Observe for now and be in touch if this is not resolving over the next couple weeks  #2 benign-appearing seborrheic keratosis right elbow medially  #3 recent splinter right fifth digit.  May have very small retained splinter fragment under the nail but has no pain whatsoever and no redness and no swelling and no signs of infection and we recommend observation.  #4 maculopapular rash which is erythematous anterior chest that blanches with pressure.  This almost looks more rosacea-like in appearance though he does not have any known history of rosacea. -Recommend he leave off topicals such as Neosporin.  He has some triamcinolone at home and will try that.  If he is not  getting prompt response to that consider possible short-term trial of doxycycline and topical rosacea medication  Meds ordered this encounter  Medications   predniSONE (DELTASONE) 10 MG tablet    Sig: Taper as follows:  4-4-4-3-3-2-2-1-1    Dispense:  24 tablet    Refill:  0    Follow-up: No follow-ups on file.    [name], MD

## 2020-10-11 NOTE — Patient Instructions (Signed)
Use some Triamcinolone cream 0.1% and use to rash twice daily  Let me know if rash not improved in two weeks.

## 2020-10-17 ENCOUNTER — Encounter: Payer: Medicare Other | Admitting: Physical Therapy

## 2020-10-24 DIAGNOSIS — M9904 Segmental and somatic dysfunction of sacral region: Secondary | ICD-10-CM | POA: Diagnosis not present

## 2020-10-24 DIAGNOSIS — M5136 Other intervertebral disc degeneration, lumbar region: Secondary | ICD-10-CM | POA: Diagnosis not present

## 2020-10-24 DIAGNOSIS — M9903 Segmental and somatic dysfunction of lumbar region: Secondary | ICD-10-CM | POA: Diagnosis not present

## 2020-10-24 DIAGNOSIS — M9905 Segmental and somatic dysfunction of pelvic region: Secondary | ICD-10-CM | POA: Diagnosis not present

## 2020-10-26 ENCOUNTER — Ambulatory Visit: Payer: Medicare Other

## 2020-10-31 ENCOUNTER — Encounter: Payer: Medicare Other | Admitting: Physical Therapy

## 2020-11-01 ENCOUNTER — Telehealth: Payer: Self-pay | Admitting: Pharmacist

## 2020-11-01 NOTE — Chronic Care Management (AMB) (Signed)
    Chronic Care Management Pharmacy Assistant   Name: EFOSA SIRCY  MRN: [id number] DOB: [birth year]  11/02/20 APPOINTMENT REMINDER   Called [name], No answer, left message of appointment on 11/02/20 at 11am via office visit with Jeni Salles, Pharm D. Notified to have all medications, supplements, blood pressure and/or blood sugar logs available during appointment and to return call if need to reschedule.   Care Gaps:  AWV - message sent to Ramond Craver CMA to schedule. Covid-19 vaccine - never done  Star Rating Drug:  None.  Any gaps in medications fill history? N/A  [place] Pharmacist Assistant [phone]

## 2020-11-02 ENCOUNTER — Telehealth: Payer: Self-pay | Admitting: Pharmacist

## 2020-11-02 ENCOUNTER — Other Ambulatory Visit: Payer: Self-pay

## 2020-11-02 ENCOUNTER — Ambulatory Visit (INDEPENDENT_AMBULATORY_CARE_PROVIDER_SITE_OTHER): Payer: Medicare Other | Admitting: Pharmacist

## 2020-11-02 VITALS — BP 120/70

## 2020-11-02 DIAGNOSIS — I10 Essential (primary) hypertension: Secondary | ICD-10-CM

## 2020-11-02 DIAGNOSIS — J309 Allergic rhinitis, unspecified: Secondary | ICD-10-CM

## 2020-11-02 NOTE — Telephone Encounter (Signed)
Called patient after discussion with PCP from CCM appt. PCP recommended holding amlodipine due to swelling and taking HCTZ daily. [place] the [place] 2 [place] and did not want to schedule a visit with his PCP until October. Plan to call back at the end of Sept to follow up and schedule PCP appt then.

## 2020-11-02 NOTE — Progress Notes (Signed)
Chronic Care Management Pharmacy Note  11/02/2020 Name:  Drew Smith MRN:  [id number] DOB:  [birth year]  Summary: BP is at goal in office < 140/90 but does not monitor at home Pt reports some dizziness and swelling around ankles   Recommendations/Changes made from today's visit: -Counseled on importance of taking blood pressure medications daily. Patient agreed to take HCTZ daily. -Consider decreasing amlodipine to see if this helps with swelling -Removed Prednisone from medication list as pt completed taper   Plan: -BP assessment in 1 month  Subjective: Drew Smith is an 80 y.o. year old male who is a primary patient of Burchette, Alinda Sierras, MD.  The CCM team was consulted for assistance with disease management and care coordination needs.    Engaged with patient face to face for follow up visit in response to provider referral for pharmacy case management and/or care coordination services.   Consent to Services:  The patient was given information about Chronic Care Management services, agreed to services, and gave verbal consent prior to initiation of services.  Please see initial visit note for detailed documentation.   Patient Care Team: [name], MD as PCP - General (Family Medicine) Stephannie Li, [place] (Ophthalmology) Viona Gilmore, [facility] as Pharmacist (Pharmacist)  Recent office visits: 10/11/20 [name], MD: Patient presented for skin rash. Recommended triamcinolone for rash. Prescribed prednisone taper.  08/29/20 [name], MD: Patient presented for memory loss and rash. Hold off on fluorouracil and neosporin on rash.  06/28/20 [name], MD: Patient presented for chronic conditions follow up. A1c only slightly elevated at 5.8%. Recommended avoiding Benadryl and trying Claritin or Allegra.  06/01/20 [name], LPN: Patient presented for AWV.  05/31/20 [name], MD: Patient presented for pain follow up. Continued prednisone  taper.  05/22/20 [name], MD: Patient presented for right shoulder pain. Steroid injection administered.  Recent consult visits: 10/03/20 Patient presented for PT session for shoulder pain.   04/06/20 Deri Fuelling, MD ([facility]): Unable to access notes.  Hospital visits: 02/12/20 Patient presented to the ED due to leg injury and lightheadedness.  Objective:  Lab Results  Component Value Date   CREATININE 0.89 02/09/2020   BUN 15 02/09/2020   GFR 80.97 02/09/2020   GFRNONAA 102 10/28/2007   GFRAA 124 10/28/2007   NA 136 02/09/2020   K 4.1 02/09/2020   CALCIUM 9.0 02/09/2020   CO2 28 02/09/2020   GLUCOSE 146 (H) 02/09/2020    Lab Results  Component Value Date/Time   HGBA1C 5.8 (A) 06/28/2020 04:16 PM   HGBA1C 5.4 12/08/2015 02:10 PM   HGBA1C 5.4 10/28/2007 12:00 AM   GFR 80.97 02/09/2020 08:11 AM   GFR 72.81 04/05/2019 10:18 AM    Last diabetic Eye exam: No results found for: HMDIABEYEEXA  Last diabetic Foot exam: No results found for: HMDIABFOOTEX   Lab Results  Component Value Date   CHOL 163 04/05/2019   HDL 47.60 04/05/2019   LDLCALC 95 04/05/2019   TRIG 103.0 04/05/2019   CHOLHDL 3 04/05/2019    Hepatic Function Latest Ref Rng & Units 02/09/2020 04/05/2019 03/31/2018  Total Protein 6.0 - 8.3 g/dL 6.5 6.7 6.9  Albumin 3.5 - 5.2 g/dL 3.8 4.0 4.2  AST 0 - 37 U/L _0 ALT 0 - 53 U/L _1 Alk Phosphatase 39 - 117 U/L 70 58 59  Total Bilirubin 0.2 - 1.2 mg/dL 0.6 0.5 0.7  Bilirubin, Direct 0.0 - 0.3  mg/dL - 0.1 0.1    Lab Results  Component Value Date/Time   TSH 1.63 08/29/2020 04:11 PM   TSH 2.96 04/05/2019 10:18 AM    CBC Latest Ref Rng & Units 02/09/2020 04/05/2019 03/31/2018  WBC 4.0 - 10.5 K/uL 7.9 6.3 7.9  Hemoglobin 13.0 - 17.0 g/dL 13.8 14.9 14.9  Hematocrit 39.0 - 52.0 % 40.4 43.3 43.2  Platelets 150.0 - 400.0 K/uL 231.0 233.0 267.0    No results found for: VD25OH  Clinical ASCVD: No  The ASCVD Risk score  (Arnett DK, et al., 2019) failed to calculate for the following reasons:   The 2019 ASCVD risk score is only valid for ages 52 to 37    Depression screen PHQ 2/9 08/29/2020 06/01/2020 03/26/2019  Decreased Interest 0 0 0  Down, Depressed, Hopeless 0 0 0  PHQ - 2 Score 0 0 0  Altered sleeping 0 - -  Tired, decreased energy 0 - -  Change in appetite 0 - -  Feeling bad or failure about yourself  0 - -  Trouble concentrating 1 - -  Moving slowly or fidgety/restless 0 - -  Suicidal thoughts 0 - -  PHQ-9 Score 1 - -  Difficult doing work/chores Not difficult at all - -      Social History   Tobacco Use  Smoking Status Never  Smokeless Tobacco Never   BP Readings from Last 3 Encounters:  11/02/20 120/70  10/11/20 140/70  08/29/20 110/70   Pulse Readings from Last 3 Encounters:  10/11/20 62  08/29/20 75  06/28/20 71   Wt Readings from Last 3 Encounters:  10/11/20 162 lb 14.4 oz (73.9 kg)  08/29/20 162 lb 6.4 oz (73.7 kg)  06/28/20 159 lb 6.4 oz (72.3 kg)   BMI Readings from Last 3 Encounters:  10/11/20 24.77 kg/m  08/29/20 24.69 kg/m  06/28/20 24.24 kg/m    Assessment/Interventions: Review of patient past medical history, allergies, medications, health status, including review of consultants reports, laboratory and other test data, was performed as part of comprehensive evaluation and provision of chronic care management services.   SDOH:  (Social Determinants of Health) assessments and interventions performed: No  SDOH Screenings   Alcohol Screen: Low Risk    Last Alcohol Screening Score (AUDIT): 2  Depression (PHQ2-9): Low Risk    PHQ-2 Score: 1  Financial Resource Strain: Low Risk    Difficulty of Paying Living Expenses: Not hard at all  Food Insecurity: No Food Insecurity   Worried About Charity fundraiser in the Last Year: Never true   Ran Out of Food in the Last Year: Never true  Housing: Low Risk    Last Housing Risk Score: 0  Physical Activity:  Sufficiently Active   Days of Exercise per Week: 5 days   Minutes of Exercise per Session: 60 min  Social Connections: Moderately Integrated   Frequency of Communication with Friends and Family: More than three times a week   Frequency of Social Gatherings with Friends and Family: More than three times a week   Attends Religious Services: 1 to 4 times per year   Active Member of [employer] or Organizations: Yes   Attends Music therapist: More than 4 times per year   Marital Status: Divorced  Stress: No Stress Concern Present   Feeling of Stress : Not at all  Tobacco Use: Low Risk    Smoking Tobacco Use: Never   Smokeless Tobacco Use: Never  Transportation Needs: No  Transportation Needs   Lack of Transportation (Medical): No   Lack of Transportation (Non-Medical): No    CCM Care Plan  No Known Allergies  Medications Reviewed Today     Reviewed by Viona Gilmore, [facility] (Pharmacist) on 11/02/20 at 1135  Med List Status: <None>   Medication Order Taking? Sig Documenting Provider Last Dose Status Informant  acyclovir (ZOVIRAX) 400 MG tablet [id number]  TAKE 1 TABLET (400 MG TOTAL) BY MOUTH AS NEEDED. [name], MD  Active   amLODipine (NORVASC) 5 MG tablet [id number]  Take 1 tablet (5 mg total) by mouth daily. [name], MD  Active   azelastine (ASTELIN) 0.1 % nasal spray [id number]  Place 1 spray into both nostrils 2 (two) times daily. Use in each nostril as directed [name], MD  Active   ciclopirox (LOPROX) 0.77 % cream [id number]  APPLY TO AFFECTED AREA(S) TWO TIMES A DAY AS DIRECTED Burchette, Alinda Sierras, MD  Active   desonide (DESOWEN) 0.05 % cream [id number]  Apply 1 application topically 2 (two) times daily as needed. [provider]  Active   fluorouracil (EFUDEX) 5 % cream [id number]  Apply topically as needed. Per Dr Joaquim Lai [provider]  Active   gabapentin (NEURONTIN) 100 MG capsule [id number]  Take one to two capsules by mouth  at night as needed for back pain. [name], MD  Active   hydrochlorothiazide (MICROZIDE) 12.5 MG capsule [id number] Yes Take 1 capsule (12.5 mg total) by mouth daily. [name], MD Taking Active   ibuprofen (ADVIL) 800 MG tablet [id number]  Take 1 tablet (800 mg total) by mouth every 8 (eight) hours as needed. [name], MD  Active   levocetirizine (XYZAL) 5 MG tablet [id number] Yes TAKE ONE TABLET BY MOUTH EVERY EVENING Burchette, Alinda Sierras, MD Taking Active   Multiple Vitamins-Minerals (CENTRUM SILVER 50+MEN PO) [id number]  Take 1 tablet by mouth daily as needed. [provider]  Active   omeprazole (PRILOSEC) 20 MG capsule [id number]  Take 1 capsule (20 mg total) by mouth 2 (two) times daily. Burchette, Alinda Sierras, MD  Active   Oxymetazoline HCl (NASAL SPRAY) 0.05 % SOLN [id number]  Place into the nose. [provider]  Active   predniSONE (DELTASONE) 10 MG tablet [id number]  Taper as follows:  4-4-4-3-3-2-2-1-1 [name], MD  Active   traMADol (ULTRAM) 50 MG tablet [id number]  Take 50 mg by mouth every 6 (six) hours as needed. [provider]  Active   tretinoin (RETIN-A) 0.05 % cream [id number]   [provider]  Active   triamcinolone cream (KENALOG) 0.1 % [id number]  Apply to affected areas two times a day as directed. [name], MD  Active   VITAMIN D, CHOLECALCIFEROL, PO [id number] Yes Take 1 tablet by mouth every other day. 5000 units every other day [provider] Taking Active   zinc gluconate 50 MG tablet [id number]  Take 50 mg by mouth daily as needed. [provider]  Active             Patient Active Problem List   Diagnosis Date Noted   Malignant melanoma of left forearm ([place]) 10/19/2019   Upper respiratory tract infection 02/14/2017   Porokeratosis 09/07/2014   Metatarsal deformity 09/07/2014   Pain in lower limb 09/07/2014   Squamous cell carcinoma in situ of skin 10/15/2013   Hives  10/06/2012   History of Guillain-Barre syndrome 03/07/2011   Hypertension 11/28/2010  Dyslipidemia 11/28/2010   ERECTILE DYSFUNCTION 01/05/2008   ASTHMA 10/28/2007   GERD 10/28/2007   DIZZINESS 10/28/2007   COUGH 10/28/2007   FASTING HYPERGLYCEMIA 10/28/2007   PROSTATE CANCER, HX OF 10/28/2007    Immunization History  Administered Date(s) Administered   Pneumococcal Polysaccharide-23 11/28/2010   Patient was confused about some of his medications and what they were for. Discussed uses for all and patient took notes. Patient noted some swelling and thinks it is from his BP medications. Recommended taking them more consistently and checking BP at home regularly to assess if needed.  Conditions to be addressed/monitored:  Hypertension, GERD, Allergic Rhinitis and Pain, Cold Sore Prevention  Care Plan : CCM Pharmacy Care Plan  Updates made by Viona Gilmore, [place] since 11/02/2020 12:00 AM     Problem: Problem: Hypertension, GERD, Allergic Rhinitis and Pain, Cold Sore Prevention      Long-Range Goal: Patient-Specific Goal   Start Date: 06/29/2020  Expected End Date: 06/29/2021  Recent Progress: On track  Priority: High  Note:   Current Barriers:  Unable to independently monitor therapeutic efficacy Does not adhere to prescribed medication regimen  Pharmacist Clinical Goal(s):  Patient will achieve adherence to monitoring guidelines and medication adherence to achieve therapeutic efficacy through collaboration with PharmD and provider.   Interventions: 1:1 collaboration with [name], MD regarding development and update of comprehensive plan of care as evidenced by provider attestation and co-signature Inter-disciplinary care team collaboration (see longitudinal plan of care) Comprehensive medication review performed; medication list updated in electronic medical record  Hypertension (BP goal <140/90) -Uncontrolled -Current treatment: Amlodipine 5 mg 1 tablet daily  (only taking every other day) Hydrochlorothiazide 12.5 mg 1 capsule daily (only taking every other day) -Medications previously tried: none  -Current home readings: not checking consistently -Current dietary habits: uses very little salt, compares package labels -Current exercise habits: limited due to pain -Denies hypotensive/hypertensive symptoms -Educated on BP goals and benefits of medications for prevention of heart attack, stroke and kidney damage; Importance of home blood pressure monitoring; Proper BP monitoring technique; -Counseled to monitor BP at home weekly, document, and provide log at future appointments -Counseled on diet and exercise extensively Recommended to continue current medication Counseled on importance of taking blood pressure medications daily. Patient agreed to take HCTZ daily.  GERD (Goal: minimize symptoms of heartburn) -Controlled -Current treatment  Omeprazole 20 mg 1 capsule once daily (rarely twice daily) -Medications previously tried: none  -Counseled on non-pharmacologic management of symptoms such as elevating the head of your bed, avoiding eating 2-3 hours before bed, avoiding triggering foods such as acidic, spicy, or fatty foods, eating smaller meals, and wearing clothes that are loose around the waist  Allergic rhinitis (Goal: minimize symptoms) -Uncontrolled -Current treatment  Azelastine 0.1% nasal spray 1 spray in both nostrils twice daily PRN Xyzal 5 mg 1 tablet every evening as needed (not taking) Oxymetazoline (Sinex) nasal spray 0.05% as needed (not taking) -Medications previously tried: n/a  -Counseled on avoiding use of Benadryl or diphenhydramine for allergies and taking only one Xyzal or Claritin or Zyrtec per day Recommended limiting use of Afrin to 3 days in a row due to rebound congestion and increased BP  Pain (Goal: limiting pain) -Controlled -Current treatment  Gabapentin 100 mg 1-2 capsules at night as needed (not  taking) Ibuprofen 800 mg 1 tablet as needed Tylenol 500 mg 1 tablet in AM and 1 tablet in PM Tramadol 50 mg 1 tablet as needed (taking one per  day) -Medications previously tried: n/a  -Counseled on limiting use of NSAIDs due to GI bleeds and kidney damage . Topicals -Uncontrolled -Current treatment  Triamcinolone cream 0.1% apply to affected areas twice daily Tretinoin 0.05% cream apply every night Fluorouracil apply topically as needed Benadryl cream daily as needed Ciclopirox 0.77% cream apply to affected area twice daily Betamethasone lotion as needed Desonide cream 0.05% as needed for feet (heat rash)  -Medications previously tried: n/a -Counseled on uses for creams and patient was unsure which one was prescribed for different skin conditions  Cold sore prevention (Goal: prevent/treat cold sores) -Controlled -Current treatment  Acyclovir 400 mg 1 tablet as needed -Medications previously tried: none  -Recommended to continue current medication   Health Maintenance -Vaccine gaps: several (Patient has a history of guillain barre syndrome and prefers to not get vaccines due to this) -Current therapy:  Lipoflavanoid for ear ringing Leg cramping tablet Tonic water with quinine Vitamin D 5000 units as needed (every 4-5 days) Zinc 50 mg 1 tablet every 4-5 days Multivitamin every 4-5 days -Educated on Cost vs benefit of each product must be carefully weighed by individual consumer -Patient is satisfied with current therapy and denies issues -Recommended to continue current medication Counseled on taking high dose of vitamin D every other day to avoid overdose  Patient Goals/Self-Care Activities Patient will:  - take medications as prescribed focus on medication adherence by taking amlodipine every evening check blood pressure weekly, document, and provide at future appointments  Follow Up Plan: Face to Face appointment with care management team member scheduled for:  6  months      Medication Assistance: None required.  Patient affirms current coverage meets needs.  Compliance/Adherence/Medication fill history: Care Gaps: COVID vaccine   Star-Rating Drugs: None  Patient's preferred pharmacy is:  [facility] [id number] [place] [address] Phone: [phone] Fax: [phone]  Uses pill box? No - has own system Pt endorses 50% compliance  We discussed: Current pharmacy is preferred with insurance plan and patient is satisfied with pharmacy services Patient decided to: Continue current medication management strategy  Care Plan and Follow Up Patient Decision:  Patient agrees to Care Plan and Follow-up.  Plan: The care management team will reach out to the patient again over the next 30 days.  Jeni Salles, PharmD [facility] Clinical Pharmacist [place] [facility] [place]

## 2020-11-14 ENCOUNTER — Encounter: Payer: Medicare Other | Admitting: Physical Therapy

## 2020-11-16 DIAGNOSIS — Z743 Need for continuous supervision: Secondary | ICD-10-CM | POA: Diagnosis not present

## 2020-11-16 DIAGNOSIS — R509 Fever, unspecified: Secondary | ICD-10-CM | POA: Diagnosis not present

## 2020-11-16 DIAGNOSIS — R42 Dizziness and giddiness: Secondary | ICD-10-CM | POA: Diagnosis not present

## 2020-11-16 DIAGNOSIS — G9341 Metabolic encephalopathy: Secondary | ICD-10-CM | POA: Diagnosis not present

## 2020-11-16 DIAGNOSIS — R0902 Hypoxemia: Secondary | ICD-10-CM | POA: Diagnosis not present

## 2020-11-16 DIAGNOSIS — R410 Disorientation, unspecified: Secondary | ICD-10-CM | POA: Diagnosis not present

## 2020-11-16 DIAGNOSIS — I10 Essential (primary) hypertension: Secondary | ICD-10-CM | POA: Diagnosis not present

## 2020-11-16 DIAGNOSIS — Z923 Personal history of irradiation: Secondary | ICD-10-CM | POA: Diagnosis not present

## 2020-11-16 DIAGNOSIS — K219 Gastro-esophageal reflux disease without esophagitis: Secondary | ICD-10-CM | POA: Diagnosis not present

## 2020-11-16 DIAGNOSIS — R079 Chest pain, unspecified: Secondary | ICD-10-CM | POA: Diagnosis not present

## 2020-11-16 DIAGNOSIS — Z20822 Contact with and (suspected) exposure to covid-19: Secondary | ICD-10-CM | POA: Diagnosis not present

## 2020-11-24 DIAGNOSIS — I10 Essential (primary) hypertension: Secondary | ICD-10-CM

## 2020-11-28 ENCOUNTER — Encounter: Payer: Medicare Other | Admitting: Physical Therapy

## 2020-11-28 ENCOUNTER — Other Ambulatory Visit: Payer: Self-pay

## 2020-11-28 ENCOUNTER — Ambulatory Visit (INDEPENDENT_AMBULATORY_CARE_PROVIDER_SITE_OTHER): Payer: Medicare Other | Admitting: Family Medicine

## 2020-11-28 VITALS — BP 150/70 | HR 65 | Temp 98.7°F | Wt 166.2 lb

## 2020-11-28 DIAGNOSIS — Z85820 Personal history of malignant melanoma of skin: Secondary | ICD-10-CM | POA: Diagnosis not present

## 2020-11-28 DIAGNOSIS — L821 Other seborrheic keratosis: Secondary | ICD-10-CM | POA: Diagnosis not present

## 2020-11-28 DIAGNOSIS — J189 Pneumonia, unspecified organism: Secondary | ICD-10-CM

## 2020-11-28 DIAGNOSIS — D225 Melanocytic nevi of trunk: Secondary | ICD-10-CM | POA: Diagnosis not present

## 2020-11-28 DIAGNOSIS — I10 Essential (primary) hypertension: Secondary | ICD-10-CM | POA: Diagnosis not present

## 2020-11-28 DIAGNOSIS — L814 Other melanin hyperpigmentation: Secondary | ICD-10-CM | POA: Diagnosis not present

## 2020-11-28 DIAGNOSIS — Z08 Encounter for follow-up examination after completed treatment for malignant neoplasm: Secondary | ICD-10-CM | POA: Diagnosis not present

## 2020-11-28 DIAGNOSIS — L570 Actinic keratosis: Secondary | ICD-10-CM | POA: Diagnosis not present

## 2020-11-28 DIAGNOSIS — L2389 Allergic contact dermatitis due to other agents: Secondary | ICD-10-CM | POA: Diagnosis not present

## 2020-11-28 NOTE — Progress Notes (Signed)
Established Patient Office Visit  Subjective:  Patient ID: Drew Smith, male    DOB: 12-Jun-1940  Age: 80 y.o. MRN: [id number]  CC:  Chief Complaint  Patient presents with   Hospitalization Follow-up    HPI Drew Smith presents for recent hospitalization.  He was down at [facility].  He apparently 1 evening became less responsive and his son came to check on him and they took him to the hospital at that point.  He apparently reportedly had some confusion and hypoxia.  He brings in a paper today which had diagnoses of acute metabolic encephalopathy, atypical pneumonia, and hypoxia.  We have no discharge records at this time.  Looks like he was treated with Levaquin for 7 days at discharge.  He is off antibiotics at this time.  Still has some fatigue but otherwise feels back to baseline.  No dyspnea.  No significant cough.  No history of smoking.  Appetite is stable.  He has history of Guillain-Barr syndrome and does not get flu vaccines.  He has hypertension which has been fairly well controlled with amlodipine and HCTZ  Past Medical History:  Diagnosis Date   Arthritis    Asthma    Cancer ([place])    prostate   GERD (gastroesophageal reflux disease)    Guillain-Barre syndrome ([place]) 2000   Hyperlipidemia    Hypertension     Past Surgical History:  Procedure Laterality Date   EYE SURGERY  [age]   80 years old   [place]  2000   seed inplants   SHOULDER SURGERY  2008    Family History  Problem Relation Age of Onset   Hyperlipidemia Brother    Stroke Brother    Diabetes Brother        type ll   Alcohol abuse Father     Social History   Socioeconomic History   Marital status: Divorced    Spouse name: Not on file   Number of children: 3   Years of education: Not on file   Highest education level: Some college, no degree  Occupational History   Occupation: retired  Tobacco Use   Smoking status:  Never   Smokeless tobacco: Never  Vaping Use   Vaping Use: Never used  Substance and Sexual Activity   Alcohol use: Yes    Alcohol/week: 7.0 standard drinks    Types: 7 Glasses of wine per week    Comment: have 1/2 a day; wine or other    Drug use: No   Sexual activity: Not Currently  Other Topics Concern   Not on file  Social History Narrative   Lives alone in one level. Has two sons 1 daughter and friends who serve as support.   Former Chiropodist; Enjoys Data processing manager, reading   Originally from [place]   Social Determinants of Health   Financial Resource Strain: Low Risk    Difficulty of Paying Living Expenses: Not hard at [employer] Insecurity: No Food Insecurity   Worried About Charity fundraiser in the Last Year: Never true   Arboriculturist in the Last Year: Never true  Transportation Needs: No Data processing manager (Medical): No   Lack of Transportation (Non-Medical): No  Physical Activity: Sufficiently Active   Days of Exercise per Week: 5 days   Minutes of Exercise per Session: 60 min  Stress: No  Stress Concern Present   Feeling of Stress : Not at all  Social Connections: Moderately Integrated   Frequency of Communication with Friends and Family: More than three times a week   Frequency of Social Gatherings with Friends and Family: More than three times a week   Attends Religious Services: 1 to 4 times per year   Active Member of [employer] or Organizations: Yes   Attends Music therapist: More than 4 times per year   Marital Status: Divorced  Human resources officer Violence: Not At Risk   Fear of Current or Ex-Partner: No   Emotionally Abused: No   Physically Abused: No   Sexually Abused: No    Outpatient Medications Prior to Visit  Medication Sig Dispense Refill   acyclovir (ZOVIRAX) 400 MG tablet TAKE 1 TABLET (400 MG TOTAL) BY MOUTH AS NEEDED. 90 tablet 2   amLODipine (NORVASC) 5 MG tablet Take 1 tablet (5 mg total) by mouth  daily. 90 tablet 3   azelastine (ASTELIN) 0.1 % nasal spray Place 1 spray into both nostrils 2 (two) times daily. Use in each nostril as directed 30 mL 12   ciclopirox (LOPROX) 0.77 % cream APPLY TO AFFECTED AREA(S) TWO TIMES A DAY AS DIRECTED 15 g 0   desonide (DESOWEN) 0.05 % cream Apply 1 application topically 2 (two) times daily as needed.     fluorouracil (EFUDEX) 5 % cream Apply topically as needed. Per Dr Joaquim Lai     gabapentin (NEURONTIN) 100 MG capsule Take one to two capsules by mouth at night as needed for back pain. 60 capsule 3   hydrochlorothiazide (MICROZIDE) 12.5 MG capsule Take 1 capsule (12.5 mg total) by mouth daily. 90 capsule 3   ibuprofen (ADVIL) 800 MG tablet Take 1 tablet (800 mg total) by mouth every 8 (eight) hours as needed. 30 tablet 0   levocetirizine (XYZAL) 5 MG tablet TAKE ONE TABLET BY MOUTH EVERY EVENING 90 tablet 0   Multiple Vitamins-Minerals (CENTRUM SILVER 50+MEN PO) Take 1 tablet by mouth daily as needed.     omeprazole (PRILOSEC) 20 MG capsule Take 1 capsule (20 mg total) by mouth 2 (two) times daily. 180 capsule 0   Oxymetazoline HCl (NASAL SPRAY) 0.05 % SOLN Place into the nose.     traMADol (ULTRAM) 50 MG tablet Take 50 mg by mouth every 6 (six) hours as needed.     tretinoin (RETIN-A) 0.05 % cream      triamcinolone cream (KENALOG) 0.1 % Apply to affected areas two times a day as directed. 30 g 0   VITAMIN D, CHOLECALCIFEROL, PO Take 1 tablet by mouth every other day. 5000 units every other day     zinc gluconate 50 MG tablet Take 50 mg by mouth daily as needed.     No facility-administered medications prior to visit.    No Known Allergies  ROS Review of Systems  Constitutional:  Positive for fatigue. Negative for chills and fever.  Respiratory:  Negative for cough, shortness of breath and wheezing.   Cardiovascular:  Negative for chest pain and leg swelling.  Gastrointestinal:  Negative for abdominal pain.  Genitourinary:  Negative for dysuria.   Neurological:  Negative for weakness.  Psychiatric/Behavioral:  Negative for confusion.      Objective:    Physical Exam Vitals reviewed.  Cardiovascular:     Rate and Rhythm: Normal rate.  Pulmonary:     Effort: Pulmonary effort is normal.     Comments: He does have diminished  breath sounds left base compared with right.  No rales.  No wheezes. Musculoskeletal:     Right lower leg: No edema.     Left lower leg: No edema.  Neurological:     Mental Status: He is alert.    BP (!) 150/70 (BP Location: Left Arm, Patient Position: Sitting, Cuff Size: Normal)   Pulse 65   Temp 98.7 F (37.1 C) (Oral)   Wt 166 lb 3.2 oz (75.4 kg)   SpO2 98%   BMI 25.27 kg/m  Wt Readings from Last 3 Encounters:  11/28/20 166 lb 3.2 oz (75.4 kg)  10/11/20 162 lb 14.4 oz (73.9 kg)  08/29/20 162 lb 6.4 oz (73.7 kg)     Health Maintenance Due  Topic Date Due   COVID-19 Vaccine (1) Never done    There are no preventive care reminders to display for this patient.  Lab Results  Component Value Date   TSH 1.63 08/29/2020   Lab Results  Component Value Date   WBC 7.9 02/09/2020   HGB 13.8 02/09/2020   HCT 40.4 02/09/2020   MCV 93.0 02/09/2020   PLT 231.0 02/09/2020   Lab Results  Component Value Date   NA 136 02/09/2020   K 4.1 02/09/2020   CO2 28 02/09/2020   GLUCOSE 146 (H) 02/09/2020   BUN 15 02/09/2020   CREATININE 0.89 02/09/2020   BILITOT 0.6 02/09/2020   ALKPHOS 70 02/09/2020   AST 12 02/09/2020   ALT 11 02/09/2020   PROT 6.5 02/09/2020   ALBUMIN 3.8 02/09/2020   CALCIUM 9.0 02/09/2020   GFR 80.97 02/09/2020   Lab Results  Component Value Date   CHOL 163 04/05/2019   Lab Results  Component Value Date   HDL 47.60 04/05/2019   Lab Results  Component Value Date   LDLCALC 95 04/05/2019   Lab Results  Component Value Date   TRIG 103.0 04/05/2019   Lab Results  Component Value Date   CHOLHDL 3 04/05/2019   Lab Results  Component Value Date   HGBA1C 5.8  (A) 06/28/2020      Assessment & Plan:   #1 recent reported community acquired pneumonia.  Patient was admitted for couple days down at the [place].  We have no records at this time.  Based on exam today suspect this was left lower lobe.  He does have some diminished breath sounds there.  He has completed 7-day course of Levaquin and feels somewhat better and afebrile at this time with normal O2 sats  -Recommend 2-week follow-up and plan repeat chest x-ray at that time -We sent request for hospital records for further review -He cannot get flu vaccine secondary to history of Guillain-Barr syndrome -Follow-up immediately for any recurrent fever, dyspnea, or other concerns.  #2 hypertension.-Has been stable in the past.  Slightly elevated systolic today.  Recheck at 2-week follow-up and adjust medications if not improved at that time.   No orders of the defined types were placed in this encounter.   Follow-up: Return in about 2 weeks (around 12/12/2020).    [name], MD

## 2020-11-28 NOTE — Patient Instructions (Signed)
Set up 2 week follow up and we will get repeat CXR then  Follow up sooner for any fever or increased shortness of breath.

## 2020-12-12 ENCOUNTER — Encounter: Payer: Medicare Other | Admitting: Physical Therapy

## 2020-12-12 ENCOUNTER — Ambulatory Visit (INDEPENDENT_AMBULATORY_CARE_PROVIDER_SITE_OTHER): Payer: Medicare Other | Admitting: Family Medicine

## 2020-12-12 ENCOUNTER — Other Ambulatory Visit: Payer: Self-pay

## 2020-12-12 VITALS — BP 142/70 | HR 69 | Temp 98.5°F | Wt 165.9 lb

## 2020-12-12 DIAGNOSIS — I10 Essential (primary) hypertension: Secondary | ICD-10-CM | POA: Diagnosis not present

## 2020-12-12 DIAGNOSIS — J189 Pneumonia, unspecified organism: Secondary | ICD-10-CM

## 2020-12-12 NOTE — Patient Instructions (Signed)
Follow up for any recurrent cough or fever  Lungs sound clear and suspect pneumonia has cleared.

## 2020-12-12 NOTE — Progress Notes (Signed)
Established Patient Office Visit  Subjective:  Patient ID: Drew Smith, male    DOB: 1940/04/20  Age: 80 y.o. MRN: [id number]  CC:  Chief Complaint  Patient presents with   Follow-up    HPI Drew Smith presents for follow-up from recent hospital follow-up visit.  [place]  We did obtain records since that visit and these were reviewed.  He had CT angiogram of the chest.  No pulmonary embolus.  Question of atypical pneumonia.  Did not have any clear lobar infiltrate from what we could see from previous imaging.  We had noticed slightly diminished breath sounds left base on last exam but his pneumonia was considered to be in the right lower lobe.  He had mildly elevated white count of 13.2 thousand.  Feels back to baseline at this time.  No persistent cough.  He had mildly elevated blood pressure last visit.  Currently on amlodipine 5 mg daily.  He has history of Guillain- Barre syndrome and does not get flu vaccines  Past Medical History:  Diagnosis Date   Arthritis    Asthma    Cancer ([place])    prostate   GERD (gastroesophageal reflux disease)    Guillain-Barre syndrome ([place]) 2000   Hyperlipidemia    Hypertension     Past Surgical History:  Procedure Laterality Date   EYE SURGERY  7921   80 years old   [place]  2000   seed inplants   SHOULDER SURGERY  2008    Family History  Problem Relation Age of Onset   Hyperlipidemia Brother    Stroke Brother    Diabetes Brother        type ll   Alcohol abuse Father     Social History   Socioeconomic History   Marital status: Divorced    Spouse name: Not on file   Number of children: 3   Years of education: Not on file   Highest education level: Some college, no degree  Occupational History   Occupation: retired  Tobacco Use   Smoking status: Never   Smokeless tobacco: Never  Vaping Use    Vaping Use: Never used  Substance and Sexual Activity   Alcohol use: Yes    Alcohol/week: 7.0 standard drinks    Types: 7 Glasses of wine per week    Comment: have 1/2 a day; wine or other    Drug use: No   Sexual activity: Not Currently  Other Topics Concern   Not on file  Social History Narrative   Lives alone in one level. Has two sons 1 daughter and friends who serve as support.   Former Chiropodist; Enjoys Data processing manager, reading   Originally from [place]   Social Determinants of Health   Financial Resource Strain: Low Risk    Difficulty of Paying Living Expenses: Not hard at [employer] Insecurity: No Food Insecurity   Worried About Charity fundraiser in the Last Year: Never true   Arboriculturist in the Last Year: Never true  Transportation Needs: No Data processing manager (Medical): No   Lack of Transportation (Non-Medical): No  Physical Activity: Sufficiently Active   Days of Exercise per Week: 5 days   Minutes of Exercise per Session: 60 min  Stress: No Stress Concern Present   Feeling  of Stress : Not at all  Social Connections: Moderately Integrated   Frequency of Communication with Friends and Family: More than three times a week   Frequency of Social Gatherings with Friends and Family: More than three times a week   Attends Religious Services: 1 to 4 times per year   Active Member of [employer] or Organizations: Yes   Attends Music therapist: More than 4 times per year   Marital Status: Divorced  Human resources officer Violence: Not At Risk   Fear of Current or Ex-Partner: No   Emotionally Abused: No   Physically Abused: No   Sexually Abused: No    Outpatient Medications Prior to Visit  Medication Sig Dispense Refill   acyclovir (ZOVIRAX) 400 MG tablet TAKE 1 TABLET (400 MG TOTAL) BY MOUTH AS NEEDED. 90 tablet 2   azelastine (ASTELIN) 0.1 % nasal spray Place 1 spray into both nostrils 2 (two) times daily. Use in each nostril as  directed 30 mL 12   ciclopirox (LOPROX) 0.77 % cream APPLY TO AFFECTED AREA(S) TWO TIMES A DAY AS DIRECTED 15 g 0   desonide (DESOWEN) 0.05 % cream Apply 1 application topically 2 (two) times daily as needed.     fluorouracil (EFUDEX) 5 % cream Apply topically as needed. Per Dr Joaquim Lai     gabapentin (NEURONTIN) 100 MG capsule Take one to two capsules by mouth at night as needed for back pain. 60 capsule 3   hydrochlorothiazide (MICROZIDE) 12.5 MG capsule Take 1 capsule (12.5 mg total) by mouth daily. 90 capsule 3   ibuprofen (ADVIL) 800 MG tablet Take 1 tablet (800 mg total) by mouth every 8 (eight) hours as needed. 30 tablet 0   levocetirizine (XYZAL) 5 MG tablet TAKE ONE TABLET BY MOUTH EVERY EVENING 90 tablet 0   Multiple Vitamins-Minerals (CENTRUM SILVER 50+MEN PO) Take 1 tablet by mouth daily as needed.     omeprazole (PRILOSEC) 20 MG capsule Take 1 capsule (20 mg total) by mouth 2 (two) times daily. 180 capsule 0   Oxymetazoline HCl (NASAL SPRAY) 0.05 % SOLN Place into the nose.     traMADol (ULTRAM) 50 MG tablet Take 50 mg by mouth every 6 (six) hours as needed.     tretinoin (RETIN-A) 0.05 % cream      triamcinolone cream (KENALOG) 0.1 % Apply to affected areas two times a day as directed. 30 g 0   VITAMIN D, CHOLECALCIFEROL, PO Take 1 tablet by mouth every other day. 5000 units every other day     zinc gluconate 50 MG tablet Take 50 mg by mouth daily as needed.     amLODipine (NORVASC) 5 MG tablet Take 1 tablet (5 mg total) by mouth daily. 90 tablet 3   No facility-administered medications prior to visit.    No Known Allergies  ROS Review of Systems  Constitutional:  Negative for chills and fever.  Respiratory:  Negative for cough and shortness of breath.   Gastrointestinal:  Negative for nausea and vomiting.  Neurological:  Negative for headaches.     Objective:    Physical Exam Vitals reviewed.  Constitutional:      Appearance: Normal appearance.  Cardiovascular:      Rate and Rhythm: Normal rate and regular rhythm.  Pulmonary:     Effort: Pulmonary effort is normal.     Breath sounds: Normal breath sounds. No wheezing or rales.  Musculoskeletal:     Right lower leg: No edema.  Left lower leg: No edema.  Neurological:     Mental Status: He is alert.    BP (!) 142/70 (BP Location: Left Arm, Patient Position: Sitting, Cuff Size: Normal)   Pulse 69   Temp 98.5 F (36.9 C) (Oral)   Wt 165 lb 14.4 oz (75.3 kg)   SpO2 97%   BMI 25.23 kg/m  Wt Readings from Last 3 Encounters:  12/12/20 165 lb 14.4 oz (75.3 kg)  11/28/20 166 lb 3.2 oz (75.4 kg)  10/11/20 162 lb 14.4 oz (73.9 kg)     Health Maintenance Due  Topic Date Due   COVID-19 Vaccine (1) Never done   Zoster Vaccines- Shingrix (1 of 2) Never done    There are no preventive care reminders to display for this patient.  Lab Results  Component Value Date   TSH 1.63 08/29/2020   Lab Results  Component Value Date   WBC 7.9 02/09/2020   HGB 13.8 02/09/2020   HCT 40.4 02/09/2020   MCV 93.0 02/09/2020   PLT 231.0 02/09/2020   Lab Results  Component Value Date   NA 136 02/09/2020   K 4.1 02/09/2020   CO2 28 02/09/2020   GLUCOSE 146 (H) 02/09/2020   BUN 15 02/09/2020   CREATININE 0.89 02/09/2020   BILITOT 0.6 02/09/2020   ALKPHOS 70 02/09/2020   AST 12 02/09/2020   ALT 11 02/09/2020   PROT 6.5 02/09/2020   ALBUMIN 3.8 02/09/2020   CALCIUM 9.0 02/09/2020   GFR 80.97 02/09/2020   Lab Results  Component Value Date   CHOL 163 04/05/2019   Lab Results  Component Value Date   HDL 47.60 04/05/2019   Lab Results  Component Value Date   LDLCALC 95 04/05/2019   Lab Results  Component Value Date   TRIG 103.0 04/05/2019   Lab Results  Component Value Date   CHOLHDL 3 04/05/2019   Lab Results  Component Value Date   HGBA1C 5.8 (A) 06/28/2020      Assessment & Plan:   #1 question of recent atypical pneumonia treated at hospital down in [facility].   Patient improved rapidly with antibiotics.  In reviewing previous [facility] at [facility] did not have any clear lobar infiltrate.  Seems back to baseline at this time.  Follow-up for any recurrent cough or other concerns  #2 hypertension.  Stable.  Repeat blood pressure left arm seated and standing obtained readings around 142/70.  Continue amlodipine 5 mg daily  No orders of the defined types were placed in this encounter.   Follow-up: No follow-ups on file.    [name], MD

## 2020-12-26 ENCOUNTER — Encounter: Payer: Medicare Other | Admitting: Physical Therapy

## 2020-12-28 ENCOUNTER — Other Ambulatory Visit: Payer: Self-pay | Admitting: Family Medicine

## 2020-12-28 NOTE — Telephone Encounter (Signed)
Last filled 12/15/2018 Last OV 12/12/2020  Ok to fill?

## 2020-12-28 NOTE — Telephone Encounter (Signed)
Disregard message below. Patient has a 90 day supply at her pharmacy.

## 2021-01-08 DIAGNOSIS — M9904 Segmental and somatic dysfunction of sacral region: Secondary | ICD-10-CM | POA: Diagnosis not present

## 2021-01-08 DIAGNOSIS — M9905 Segmental and somatic dysfunction of pelvic region: Secondary | ICD-10-CM | POA: Diagnosis not present

## 2021-01-08 DIAGNOSIS — M5136 Other intervertebral disc degeneration, lumbar region: Secondary | ICD-10-CM | POA: Diagnosis not present

## 2021-01-08 DIAGNOSIS — M9903 Segmental and somatic dysfunction of lumbar region: Secondary | ICD-10-CM | POA: Diagnosis not present

## 2021-01-09 ENCOUNTER — Encounter: Payer: Medicare Other | Admitting: Physical Therapy

## 2021-01-09 DIAGNOSIS — Z85820 Personal history of malignant melanoma of skin: Secondary | ICD-10-CM | POA: Diagnosis not present

## 2021-01-09 DIAGNOSIS — Z08 Encounter for follow-up examination after completed treatment for malignant neoplasm: Secondary | ICD-10-CM | POA: Diagnosis not present

## 2021-01-09 DIAGNOSIS — L821 Other seborrheic keratosis: Secondary | ICD-10-CM | POA: Diagnosis not present

## 2021-01-09 DIAGNOSIS — L814 Other melanin hyperpigmentation: Secondary | ICD-10-CM | POA: Diagnosis not present

## 2021-01-09 DIAGNOSIS — D225 Melanocytic nevi of trunk: Secondary | ICD-10-CM | POA: Diagnosis not present

## 2021-01-09 DIAGNOSIS — Z85828 Personal history of other malignant neoplasm of skin: Secondary | ICD-10-CM | POA: Diagnosis not present

## 2021-01-09 DIAGNOSIS — L570 Actinic keratosis: Secondary | ICD-10-CM | POA: Diagnosis not present

## 2021-01-22 ENCOUNTER — Other Ambulatory Visit: Payer: Self-pay | Admitting: Family Medicine

## 2021-01-23 ENCOUNTER — Encounter: Payer: Medicare Other | Admitting: Physical Therapy

## 2021-01-24 DIAGNOSIS — M9903 Segmental and somatic dysfunction of lumbar region: Secondary | ICD-10-CM | POA: Diagnosis not present

## 2021-01-24 DIAGNOSIS — M9905 Segmental and somatic dysfunction of pelvic region: Secondary | ICD-10-CM | POA: Diagnosis not present

## 2021-01-24 DIAGNOSIS — M5136 Other intervertebral disc degeneration, lumbar region: Secondary | ICD-10-CM | POA: Diagnosis not present

## 2021-01-24 DIAGNOSIS — M9904 Segmental and somatic dysfunction of sacral region: Secondary | ICD-10-CM | POA: Diagnosis not present

## 2021-02-06 ENCOUNTER — Encounter: Payer: Medicare Other | Admitting: Physical Therapy

## 2021-02-20 ENCOUNTER — Ambulatory Visit: Payer: Medicare Other | Admitting: Physical Therapy

## 2021-03-02 ENCOUNTER — Emergency Department (HOSPITAL_COMMUNITY): Payer: PPO

## 2021-03-02 ENCOUNTER — Encounter (HOSPITAL_COMMUNITY): Payer: Self-pay | Admitting: Emergency Medicine

## 2021-03-02 ENCOUNTER — Other Ambulatory Visit: Payer: Self-pay

## 2021-03-02 ENCOUNTER — Emergency Department (HOSPITAL_COMMUNITY)
Admission: EM | Admit: 2021-03-02 | Discharge: 2021-03-03 | Disposition: A | Discharge status: Home / Self Care | Payer: PPO | Attending: Emergency Medicine | Admitting: Emergency Medicine

## 2021-03-02 DIAGNOSIS — S0990XA Unspecified injury of head, initial encounter: Secondary | ICD-10-CM | POA: Diagnosis not present

## 2021-03-02 DIAGNOSIS — R569 Unspecified convulsions: Secondary | ICD-10-CM | POA: Diagnosis not present

## 2021-03-02 DIAGNOSIS — Z79899 Other long term (current) drug therapy: Secondary | ICD-10-CM | POA: Diagnosis not present

## 2021-03-02 DIAGNOSIS — I10 Essential (primary) hypertension: Secondary | ICD-10-CM | POA: Diagnosis not present

## 2021-03-02 DIAGNOSIS — J9811 Atelectasis: Secondary | ICD-10-CM | POA: Diagnosis not present

## 2021-03-02 DIAGNOSIS — S40011A Contusion of right shoulder, initial encounter: Secondary | ICD-10-CM | POA: Insufficient documentation

## 2021-03-02 DIAGNOSIS — W19XXXA Unspecified fall, initial encounter: Secondary | ICD-10-CM | POA: Diagnosis not present

## 2021-03-02 DIAGNOSIS — M19011 Primary osteoarthritis, right shoulder: Secondary | ICD-10-CM | POA: Diagnosis not present

## 2021-03-02 HISTORY — DX: Unspecified convulsions: R56.9

## 2021-03-02 LAB — CBG MONITORING, ED: Glucose-Capillary: 133 mg/dL — ABNORMAL HIGH (ref 70–99)

## 2021-03-02 NOTE — ED Triage Notes (Addendum)
Patient found himself on the floor post a nap. Family believes he may have had a seizure and fell on the floor. He had bit his tongue, he had sore muscles and he seemed disoriented. The family believes he "came to" about 29. At [place] he still had weakness and seemed disoriented. Family states he also complained of slight dizziness and a headache. Patient is currently alert and oriented x4.

## 2021-03-02 NOTE — ED Provider Triage Note (Signed)
Emergency Medicine Provider Triage Evaluation Note  Drew Smith , a 81 y.o. male  was evaluated in triage.  Pt presenting after a likely seizure.  Patient has a history of seizure-like episodes, first of which was in September 2022.  Has not seen a neurologist for this and is not on antiepileptics.  Today the patient played golf and returned home feeling fatigued.  Went to lay down and then he remembers he was waking up on the floor in a ball and there was blood coming from his face.  He called his son and ultimately was encouraged to come to the emergency department.  Denies any headache, visual disturbances or lingering symptoms in the department at this time.  Review of Systems  As above  Physical Exam  BP (!) 148/83    Pulse 82    Temp 98.6 F (37 C) (Oral)    Resp 18    SpO2 98%  Gen:   Awake, no distress   Resp:  Normal effort  MSK:   Moves extremities without difficulty  Other:  Superficial abrasion to the right tongue.  No obvious dental trauma.  Dried blood on patient's nose.  Alert and oriented at this time.  PERRLA.  Medical Decision Making  Medically screening exam initiated at 10:35 PM.  Appropriate orders placed.  Drew Smith was informed that the remainder of the evaluation will be completed by another provider, this initial triage assessment does not replace that evaluation, and the importance of remaining in the ED until their evaluation is complete.   Seizure work-up pursued.  Alert and oriented at this time.  This chart was dictated using voice recognition software.  Despite best efforts to proofread,  errors can occur which can change the documentation meaning.    Rhae Hammock, PA-C 03/02/21 [id number]

## 2021-03-03 ENCOUNTER — Emergency Department (HOSPITAL_COMMUNITY): Payer: PPO

## 2021-03-03 DIAGNOSIS — M19011 Primary osteoarthritis, right shoulder: Secondary | ICD-10-CM | POA: Diagnosis not present

## 2021-03-03 DIAGNOSIS — J9811 Atelectasis: Secondary | ICD-10-CM | POA: Diagnosis not present

## 2021-03-03 DIAGNOSIS — R569 Unspecified convulsions: Secondary | ICD-10-CM | POA: Diagnosis not present

## 2021-03-03 LAB — RAPID URINE DRUG SCREEN, HOSP PERFORMED
Amphetamines: NOT DETECTED
Barbiturates: NOT DETECTED
Benzodiazepines: NOT DETECTED
Cocaine: NOT DETECTED
Opiates: NOT DETECTED
Tetrahydrocannabinol: NOT DETECTED

## 2021-03-03 LAB — CBC
HCT: 43.2 % (ref 39.0–52.0)
Hemoglobin: 14.9 g/dL (ref 13.0–17.0)
MCH: 31.4 pg (ref 26.0–34.0)
MCHC: 34.5 g/dL (ref 30.0–36.0)
MCV: 91.1 fL (ref 80.0–100.0)
Platelets: 261 10*3/uL (ref 150–400)
RBC: 4.74 MIL/uL (ref 4.22–5.81)
RDW: 12.8 % (ref 11.5–15.5)
WBC: 11.1 10*3/uL — ABNORMAL HIGH (ref 4.0–10.5)
nRBC: 0 % (ref 0.0–0.2)

## 2021-03-03 LAB — BASIC METABOLIC PANEL
Anion gap: 6 (ref 5–15)
BUN: 20 mg/dL (ref 8–23)
CO2: 25 mmol/L (ref 22–32)
Calcium: 8.9 mg/dL (ref 8.9–10.3)
Chloride: 102 mmol/L (ref 98–111)
Creatinine, Ser: 1.09 mg/dL (ref 0.61–1.24)
GFR, Estimated: >60 mL/min (ref >60)
Glucose, Bld: 129 mg/dL — ABNORMAL HIGH (ref 70–99)
Potassium: 3.8 mmol/L (ref 3.5–5.1)
Sodium: 133 mmol/L — ABNORMAL LOW (ref 135–145)

## 2021-03-03 LAB — URINALYSIS, ROUTINE W REFLEX MICROSCOPIC
Bacteria, UA: NONE SEEN
Bilirubin Urine: NEGATIVE
Glucose, UA: NEGATIVE mg/dL
Hgb urine dipstick: NEGATIVE
Ketones, ur: NEGATIVE mg/dL
Leukocytes,Ua: NEGATIVE
Nitrite: NEGATIVE
Protein, ur: 100 mg/dL — AB
Specific Gravity, Urine: 1.019 (ref 1.005–1.030)
pH: 5 (ref 5.0–8.0)

## 2021-03-03 LAB — HEPATIC FUNCTION PANEL
ALT: 16 U/L (ref 0–44)
AST: 24 U/L (ref 15–41)
Albumin: 3.7 g/dL (ref 3.5–5.0)
Alkaline Phosphatase: 57 U/L (ref 38–126)
Bilirubin, Direct: 0.1 mg/dL (ref 0.0–0.2)
Indirect Bilirubin: 0.7 mg/dL (ref 0.3–0.9)
Total Bilirubin: 0.8 mg/dL (ref 0.3–1.2)
Total Protein: 6.6 g/dL (ref 6.5–8.1)

## 2021-03-03 LAB — MAGNESIUM: Magnesium: 2 mg/dL (ref 1.7–2.4)

## 2021-03-03 LAB — ETHANOL: Alcohol, Ethyl (B): <10 mg/dL (ref <10)

## 2021-03-03 MED ORDER — IBUPROFEN 200 MG PO TABS
400.0000 mg | ORAL_TABLET | Freq: Once | ORAL | Status: AC
  Administered 2021-03-03: 400 mg via ORAL
  Filled 2021-03-03: qty 2

## 2021-03-03 MED ORDER — LEVETIRACETAM 500 MG PO TABS
500.0000 mg | ORAL_TABLET | Freq: Once | ORAL | Status: AC
Start: 2021-03-03 — End: 2021-03-03
  Administered 2021-03-03: 500 mg via ORAL
  Filled 2021-03-03: qty 1

## 2021-03-03 MED ORDER — LEVETIRACETAM 500 MG PO TABS: 500.0000 mg | ORAL_TABLET | Freq: Two times a day (BID) | ORAL | 0 refills | Status: DC

## 2021-03-03 NOTE — ED Notes (Signed)
Patient transported to X-ray 

## 2021-03-03 NOTE — ED Provider Notes (Signed)
[facility] DEPT Provider Note   CSN: [id number] Arrival date & time: 03/02/21  2203     History  Chief Complaint  Patient presents with   Fall   Seizures    Drew Smith is a 81 y.o. male.  The history is provided by the patient, medical records and a relative.  Fall This is a new problem.  Seizures Drew Smith is a 81 y.o. male who presents to the Emergency Department complaining of fall.  Pt presents to the ED accompanied by his son for evaluation of fall.  Went to lay down for a nap around 4pm and awoke on the floor in a heap around 615pm.  He was able to get himself up after about 10-15 minutes.  He is unsure how long he was on the floor or how he got there.  Felt confused when he awoke, possibly 15 minutes.    He complains of pain to his mouth, right shoulder, nose.  No loss of bowel/bladder.    No recent illness.  Played 18 holes of golf today.  No fever, v/d.  Has chronic cough.   Has a hx/o HTN.   Had [place] the beach, same situation.  [facility] in [facility], admitted for 1.5 days.    No tobacco.  Drinks alcohol 1-3 drinks daily (bourbon).  Had one beer after his golf game.      Home Medications Prior to Admission medications   Medication Sig Start Date End Date Taking? Authorizing Provider  levETIRAcetam (KEPPRA) 500 MG tablet Take 1 tablet (500 mg total) by mouth 2 (two) times daily. 03/03/21  Yes Quintella Reichert, MD  acyclovir (ZOVIRAX) 400 MG tablet TAKE 1 TABLET (400 MG TOTAL) BY MOUTH AS NEEDED. 01/10/13   Burchette, Alinda Sierras, MD  amLODipine (NORVASC) 5 MG tablet Take 1 tablet (5 mg total) by mouth daily. 07/03/20   Burchette, Alinda Sierras, MD  azelastine (ASTELIN) 0.1 % nasal spray Place 1 spray into both nostrils 2 (two) times daily. Use in each nostril as directed 03/19/16   Burchette, Alinda Sierras, MD  ciclopirox (LOPROX) 0.77 % cream APPLY TO AFFECTED AREA(S) TWO TIMES A DAY AS DIRECTED 12/16/19    Burchette, Alinda Sierras, MD  desonide (DESOWEN) 0.05 % cream Apply 1 application topically 2 (two) times daily as needed.    [provider]  fluorouracil (EFUDEX) 5 % cream Apply topically as needed. Per Dr Joaquim Lai    [provider]  gabapentin (NEURONTIN) 100 MG capsule Take one to two capsules by mouth at night as needed for back pain. 03/07/20   Burchette, Alinda Sierras, MD  hydrochlorothiazide (MICROZIDE) 12.5 MG capsule Take 1 capsule (12.5 mg total) by mouth daily. 07/03/20   Burchette, Alinda Sierras, MD  ibuprofen (ADVIL) 800 MG tablet Take 1 tablet (800 mg total) by mouth every 8 (eight) hours as needed. 03/17/20   Burchette, Alinda Sierras, MD  levocetirizine (XYZAL) 5 MG tablet TAKE ONE TABLET BY MOUTH EVERY EVENING 12/15/18   Burchette, Alinda Sierras, MD  Multiple Vitamins-Minerals (CENTRUM SILVER 50+MEN PO) Take 1 tablet by mouth daily as needed.    [provider]  omeprazole (PRILOSEC) 20 MG capsule TAKE ONE CAPSULE BY MOUTH TWICE A DAY 01/23/21   Burchette, Alinda Sierras, MD  Oxymetazoline HCl (NASAL SPRAY) 0.05 % SOLN Place into the nose.    [provider]  traMADol (ULTRAM) 50 MG tablet Take 50 mg by mouth every  6 (six) hours as needed.    [provider]  tretinoin (RETIN-A) 0.05 % cream  10/18/19   [provider]  triamcinolone cream (KENALOG) 0.1 % Apply to affected areas two times a day as directed. 09/12/20   Burchette, Alinda Sierras, MD  VITAMIN D, CHOLECALCIFEROL, PO Take 1 tablet by mouth every other day. 5000 units every other day    [provider]  zinc gluconate 50 MG tablet Take 50 mg by mouth daily as needed.    [provider]      Allergies    Patient has no known allergies.    Review of Systems   Review of Systems  Neurological:  Positive for seizures.  All other systems reviewed and are negative.  Physical Exam Updated Vital Signs BP (!) 156/76    Pulse 65    Temp 98.4 F (36.9 C) (Oral)    Resp 17    Ht 5\' 8"  (1.727 m)    Wt  74.8 kg    SpO2 97%    BMI 25.09 kg/m  Physical Exam Vitals and nursing note reviewed.  Constitutional:      Appearance: He is well-developed.  HENT:     Head: Normocephalic and atraumatic.  Cardiovascular:     Rate and Rhythm: Normal rate and regular rhythm.     Heart sounds: No murmur heard. Pulmonary:     Effort: Pulmonary effort is normal. No respiratory distress.     Breath sounds: Normal breath sounds.  Abdominal:     Palpations: Abdomen is soft.     Tenderness: There is no abdominal tenderness. There is no guarding or rebound.  Musculoskeletal:        General: No tenderness.  Skin:    General: Skin is warm and dry.  Neurological:     Mental Status: He is alert and oriented to person, place, and time.  Psychiatric:        Behavior: Behavior normal.    ED Results / Procedures / Treatments   Labs (all labs ordered are listed, but only abnormal results are displayed) Labs Reviewed  BASIC METABOLIC PANEL - Abnormal; Notable for the following components:      Result Value   Sodium 133 (*)    Glucose, Bld 129 (*)    All other components within normal limits  CBC - Abnormal; Notable for the following components:   WBC 11.1 (*)    All other components within normal limits  URINALYSIS, ROUTINE W REFLEX MICROSCOPIC - Abnormal; Notable for the following components:   Protein, ur 100 (*)    All other components within normal limits  CBG MONITORING, ED - Abnormal; Notable for the following components:   Glucose-Capillary 133 (*)    All other components within normal limits  URINE CULTURE  ETHANOL  HEPATIC FUNCTION PANEL  MAGNESIUM  RAPID URINE DRUG SCREEN, HOSP PERFORMED    EKG EKG Interpretation  Date/Time:  Friday March 02 2021 23:34:04 EST Ventricular Rate:  69 PR Interval:  204 QRS Duration: 102 QT Interval:  381 QTC Calculation: 409 R Axis:   -21 Text Interpretation: Sinus rhythm Abnormal R-wave progression, late transition LVH with secondary  repolarization abnormality Confirmed by Quintella Reichert [phone]) on 03/03/2021 12:36:01 AM  Radiology DG Chest 2 View  Result Date: 03/03/2021 CLINICAL DATA:  Status post seizure and subsequent fall. EXAM: CHEST - 2 VIEW COMPARISON:  Jul 09, 2017 FINDINGS: Low lung volumes are again seen on the left with stable elevation of  the left hemidiaphragm. Mild atelectasis is seen within the bilateral lung bases. There is no evidence of a pleural effusion or pneumothorax. The heart size and mediastinal contours are within normal limits. Degenerative changes seen throughout the thoracic spine. IMPRESSION: Low lung volumes with mild bibasilar atelectasis. Electronically Signed   By: Virgina Norfolk M.D.   On: 03/03/2021 01:02   DG Shoulder Right  Result Date: 03/03/2021 CLINICAL DATA:  Status post seizure with subsequent fall. EXAM: RIGHT SHOULDER - 2+ VIEW COMPARISON:  None. FINDINGS: There is no evidence of fracture or dislocation. Mild degenerative changes seen involving the right acromioclavicular joint. Soft tissues are unremarkable. IMPRESSION: Mild degenerative changes of the right acromioclavicular joint. Electronically Signed   By: Virgina Norfolk M.D.   On: 03/03/2021 01:04   CT Head Wo Contrast  Result Date: 03/02/2021 CLINICAL DATA:  Head trauma, moderate to severe. EXAM: CT HEAD WITHOUT CONTRAST TECHNIQUE: Contiguous axial images were obtained from the base of the skull through the vertex without intravenous contrast. COMPARISON:  None. FINDINGS: Brain: No acute intracranial hemorrhage, midline shift or mass effect. No extra-axial fluid collection. Mild periventricular white matter hypodensities are present bilaterally. No hydrocephalus. Vascular: No hyperdense vessel or unexpected calcification. Skull: Normal. Negative for fracture or focal lesion. Sinuses/Orbits: No acute finding. Other: None. IMPRESSION: No acute intracranial process. Electronically Signed   By: Brett Fairy M.D.   On: 03/02/2021  22:48    Procedures Procedures    Medications Ordered in ED Medications  ibuprofen (ADVIL) tablet 400 mg (400 mg Oral Given 03/03/21 0350)  levETIRAcetam (KEPPRA) tablet 500 mg (500 mg Oral Given 03/03/21 0510)    ED Course/ Medical Decision Making/ A&P                           Medical Decision Making  Patient with history of prostate cancer here for evaluation following a syncopal episode, found on the ground.  He does have evidence of tongue contusion consistent with biting his tongue.  He has no focal neurologic deficits on evaluation and CT scan is without acute abnormality.  Records reviewed from his September 2022 hospitalization.  Event at that time sounds quite similar to today's event.  Discussed patient's history with on-call neurologist.  Recommendation is to start [place] with seizure precautions with outpatient neurology follow-up and return precautions.  Current clinical picture is not consistent with sepsis, alcohol withdrawal seizure, CVA.  BMP with mild hyponatremia, likely not a factor in the event today.  CBC with leukocytosis, no evidence of acute infection.  He does have some right shoulder pain, mild pain with range of motion-images are negative for acute fracture or dislocation.        Final Clinical Impression(s) / ED Diagnoses Final diagnoses:  Seizure ([place])  Contusion of right shoulder, initial encounter    Rx / DC Orders ED Discharge Orders          Ordered    Ambulatory referral to Neurology       Comments: An appointment is requested in approximately: 2 weeks Re new onset seizures   03/03/21 0352    levETIRAcetam (KEPPRA) 500 MG tablet  2 times daily        03/03/21 0408              Quintella Reichert, MD 03/03/21 [id number]

## 2021-03-03 NOTE — ED Notes (Signed)
Lab notified of urine culture, Molly in lab states she has received it and will add it on

## 2021-03-03 NOTE — ED Notes (Signed)
MD at bedside. 

## 2021-03-04 LAB — URINE CULTURE: Culture: NO GROWTH

## 2021-03-06 ENCOUNTER — Ambulatory Visit: Payer: PPO | Admitting: Neurology

## 2021-03-06 ENCOUNTER — Other Ambulatory Visit: Payer: Self-pay

## 2021-03-06 ENCOUNTER — Encounter: Payer: Self-pay | Admitting: Neurology

## 2021-03-06 ENCOUNTER — Telehealth: Payer: Self-pay | Admitting: Family Medicine

## 2021-03-06 VITALS — BP 154/75 | HR 60 | Ht 68.0 in | Wt 164.2 lb

## 2021-03-06 DIAGNOSIS — I10 Essential (primary) hypertension: Secondary | ICD-10-CM | POA: Diagnosis not present

## 2021-03-06 DIAGNOSIS — G63 Polyneuropathy in diseases classified elsewhere: Secondary | ICD-10-CM | POA: Diagnosis not present

## 2021-03-06 DIAGNOSIS — E785 Hyperlipidemia, unspecified: Secondary | ICD-10-CM | POA: Diagnosis not present

## 2021-03-06 DIAGNOSIS — R4020 Unspecified coma: Secondary | ICD-10-CM

## 2021-03-06 DIAGNOSIS — Z9181 History of falling: Secondary | ICD-10-CM

## 2021-03-06 DIAGNOSIS — G610 Guillain-Barre syndrome: Secondary | ICD-10-CM | POA: Diagnosis not present

## 2021-03-06 DIAGNOSIS — G8929 Other chronic pain: Secondary | ICD-10-CM | POA: Diagnosis not present

## 2021-03-06 DIAGNOSIS — J309 Allergic rhinitis, unspecified: Secondary | ICD-10-CM | POA: Diagnosis not present

## 2021-03-06 DIAGNOSIS — F320 Major depressive disorder, single episode, mild: Secondary | ICD-10-CM | POA: Diagnosis not present

## 2021-03-06 DIAGNOSIS — K219 Gastro-esophageal reflux disease without esophagitis: Secondary | ICD-10-CM | POA: Diagnosis not present

## 2021-03-06 DIAGNOSIS — R569 Unspecified convulsions: Secondary | ICD-10-CM | POA: Diagnosis not present

## 2021-03-06 DIAGNOSIS — J45909 Unspecified asthma, uncomplicated: Secondary | ICD-10-CM | POA: Diagnosis not present

## 2021-03-06 DIAGNOSIS — M1990 Unspecified osteoarthritis, unspecified site: Secondary | ICD-10-CM | POA: Diagnosis not present

## 2021-03-06 DIAGNOSIS — H259 Unspecified age-related cataract: Secondary | ICD-10-CM | POA: Diagnosis not present

## 2021-03-06 MED ORDER — LEVETIRACETAM 500 MG PO TABS: 500.0000 mg | ORAL_TABLET | Freq: Two times a day (BID) | ORAL | 5 refills | Status: DC

## 2021-03-06 NOTE — Progress Notes (Signed)
NEUROLOGY CONSULTATION NOTE  Drew Smith MRN: [id number] DOB: 1940/03/23  Referring provider: Dr. Quintella Reichert (ER) Primary care provider: Dr. [name]  Reason for consult:  new onset seizures  Dear Dr Ralene Bathe:  Thank you for your kind referral of Drew Smith for consultation of the above symptoms. Although his history is well known to you, please allow me to reiterate it for the purpose of our medical record. The patient was accompanied to the clinic by his son Lennette Bihari who also provides collateral information. Records and images were personally reviewed where available.  HISTORY OF PRESENT ILLNESS: This is an 81 year old right-handed man with a history of hypertension, hyperlipidemia, prostate cancer, presenting for 2 episodes of loss of consciousness. He is accompanied by his son Lennette Bihari who helps supplement the history today. The first event occurred on 11/16/20 while at [facility] and he was brought to [facility]. He was attending a dance festival for several days, he danced earlier that day and recalls having breakfast, went to a mid-day club party (did not drink alcohol), went back to his room to rest for a couple of hours. He recalls laying on the bed then waking up to people around him yelling, unable to understand them. Lennette Bihari reports he heard a noise and found his father moaning and groaning, he was not responding with eyes opening and closing "almost like having a bad dream." This lasted 5 minutes, he woke up and did not realize what was going on, it took about 10 minutes before he could answer orientation questions. No focal weakness, tongue bite or incontinence. Per notes, he was feverish, diaphoretic and EMS reported O2 sats in the 80s. Mental status improved en route to hospital. CT chest showed diffuse ground-glass in the right lung that can be seen in atypical particularly viral infections, WBC 13,  he was treated with Rocephin and Zmax and mental status  returned to normal very rapidly. The next event occurred on 03/02/2021, again after a day of activity playing golf. He ate breakfast and had snacks, went home to clean up. He recalls feeling great when he lay down for a nap at 4pm, then woke up face down on the floor curled up in blankets around 6pm. He had difficulty getting up initially but managed to stand feeling woozy. His other son called and came, he felt fine but had a large abrasion on the bridge of his nose and bit his tongue, so they decided to go to the ER. Bloodwork showed WBC 11.1, Na 133. UDS, EtOH negative. I personally reviewed head CT without contrast which did not show any acute changes. Symptoms felt concerning for seizure so he was discharged home on Levetiracetam 500mg  BID which makes him a little drowsy.  He has had brief body jerks in the past 3-5 years. He and his son deny any staring/unresponsive episodes. He denies any olfactory/gustatory hallucinations, deja vu, rising epigastric sensation, focal numbness/tingling/weakness. He has a "little problem with balance" and does balance exercises regularly. He denies any headaches, diplopia, dysarthria/dysphagia, neck pain, bowel/bladder dysfunction. His back bothers him some. He feels his memory is pretty good for his age, he makes notes all the time. He denies missing medications, bill payments, or getting lost driving. 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 He had a normal birth and early development.  There is no history of febrile convulsions, CNS infections such as meningitis/encephalitis, significant traumatic brain injury, or  family history of seizures. He drinks about 1.5 glasses of alcohol daily and recalls having a little more during the dance festival in 10/2020 and one beer after his golf game on 03/02/21. His son notes a fall 2 weeks ago after he had 4 drinks, he fell getting out of the car in the dark, no loss of  consciousness. He reports high anxiety with very infrequent palpitations, no chest pain or shortness of breath.   PAST MEDICAL HISTORY: Past Medical History:  Diagnosis Date   Arthritis    Asthma    Cancer ([place])    prostate   GERD (gastroesophageal reflux disease)    Guillain-Barre syndrome ([place]) 02/25/1998   Hyperlipidemia    Hypertension    Seizures ([place])     PAST SURGICAL HISTORY: Past Surgical History:  Procedure Laterality Date   EYE SURGERY  8341   81 years old   H[facility]rix   seed inplants   SHOULDER SURGERY  2008    MEDICATIONS: Current Outpatient Medications on File Prior to Visit  Medication Sig Dispense Refill   acyclovir (ZOVIRAX) 400 MG tablet TAKE 1 TABLET (400 MG TOTAL) BY MOUTH AS NEEDED. 90 tablet 2   amLODipine (NORVASC) 5 MG tablet Take 1 tablet (5 mg total) by mouth daily. 90 tablet 3   azelastine (ASTELIN) 0.1 % nasal spray Place 1 spray into both nostrils 2 (two) times daily. Use in each nostril as directed 30 mL 12   ciclopirox (LOPROX) 0.77 % cream APPLY TO AFFECTED AREA(S) TWO TIMES A DAY AS DIRECTED 15 g 0   desonide (DESOWEN) 0.05 % cream Apply 1 application topically 2 (two) times daily as needed.     fluorouracil (EFUDEX) 5 % cream Apply topically as needed. Per Dr Joaquim Lai     gabapentin (NEURONTIN) 100 MG capsule Take one to two capsules by mouth at night as needed for back pain. 60 capsule 3   hydrochlorothiazide (MICROZIDE) 12.5 MG capsule Take 1 capsule (12.5 mg total) by mouth daily. 90 capsule 3   ibuprofen (ADVIL) 800 MG tablet Take 1 tablet (800 mg total) by mouth every 8 (eight) hours as needed. 30 tablet 0   levETIRAcetam (KEPPRA) 500 MG tablet Take 1 tablet (500 mg total) by mouth 2 (two) times daily. 60 tablet 0   levocetirizine (XYZAL) 5 MG tablet TAKE ONE TABLET BY MOUTH EVERY EVENING 90 tablet 0   Multiple Vitamins-Minerals (CENTRUM SILVER 50+MEN PO) Take 1 tablet by mouth daily as  needed.     omeprazole (PRILOSEC) 20 MG capsule TAKE ONE CAPSULE BY MOUTH TWICE A DAY 180 capsule 0   Oxymetazoline HCl (NASAL SPRAY) 0.05 % SOLN Place into the nose.     traMADol (ULTRAM) 50 MG tablet Take 50 mg by mouth every 6 (six) hours as needed.     tretinoin (RETIN-A) 0.05 % cream      triamcinolone cream (KENALOG) 0.1 % Apply to affected areas two times a day as directed. 30 g 0   VITAMIN D, CHOLECALCIFEROL, PO Take 1 tablet by mouth every other day. 5000 units every other day     zinc gluconate 50 MG tablet Take 50 mg by mouth daily as needed.     No current facility-administered medications on file prior to visit.    ALLERGIES: No Known Allergies  FAMILY HISTORY: Family History  Problem Relation Age of Onset   Hyperlipidemia Brother  Stroke Brother    Diabetes Brother        type ll   Alcohol abuse Father     SOCIAL HISTORY: Social History   Socioeconomic History   Marital status: Divorced    Spouse name: Not on file   Number of children: 3   Years of education: Not on file   Highest education level: Some college, no degree  Occupational History   Occupation: retired  Tobacco Use   Smoking status: Never   Smokeless tobacco: Never  Vaping Use   Vaping Use: Never used  Substance and Sexual Activity   Alcohol use: Yes    Alcohol/week: 7.0 standard drinks    Types: 7 Glasses of wine per week    Comment: have 1/2 a day; wine or other    Drug use: No   Sexual activity: Not Currently  Other Topics Concern   Not on file  Social History Narrative   Lives alone in one level. Has two sons 1 daughter and friends who serve as support.   Former Chiropodist; Enjoys Data processing manager, reading   Originally from [place]   Social Determinants of Health   Financial Resource Strain: Low Risk    Difficulty of Paying Living Expenses: Not hard at [employer] Insecurity: No Food Insecurity   Worried About Charity fundraiser in the Last Year: Never true   Arboriculturist in  the Last Year: Never true  Transportation Needs: No Data processing manager (Medical): No   Lack of Transportation (Non-Medical): No  Physical Activity: Sufficiently Active   Days of Exercise per Week: 5 days   Minutes of Exercise per Session: 60 min  Stress: No Stress Concern Present   Feeling of Stress : Not at all  Social Connections: Moderately Integrated   Frequency of Communication with Friends and Family: More than three times a week   Frequency of Social Gatherings with Friends and Family: More than three times a week   Attends Religious Services: 1 to 4 times per year   Active Member of [employer] or Organizations: Yes   Attends Music therapist: More than 4 times per year   Marital Status: Divorced  Human resources officer Violence: Not At Risk   Fear of Current or Ex-Partner: No   Emotionally Abused: No   Physically Abused: No   Sexually Abused: No     PHYSICAL EXAM: Vitals:   03/06/21 0855  BP: (!) 154/75  Pulse: 60  SpO2: 98%   General: No acute distress Head:  Normocephalic/atraumatic Skin/Extremities: No rash, no edema Neurological Exam: Mental status: alert and oriented to person, place, and time, no dysarthria or aphasia, Fund of knowledge is appropriate.  Recent and remote memory are intact, 3/3 delayed recall.  Attention and concentration are normal, 5/5 WORLD backwards. Cranial nerves: CN I: not tested CN II: pupils equal, round and reactive to light, visual fields intact CN III, IV, VI:  full range of motion, no nystagmus, no ptosis CN V: facial sensation intact CN VII: upper and lower face symmetric CN VIII: hearing intact to conversation Bulk & Tone: normal, no fasciculations. Motor: 5/5 throughout with no pronator drift. Sensation: intact to light touch, cold, pin, vibration and joint position sense.  No extinction to double simultaneous stimulation.  Romberg test negative Deep Tendon Reflexes: unable to elicit  throughout Cerebellar: no incoordination on finger to nose testing Gait: slow and cautious, good arm swing Tremor: none  IMPRESSION: This is an 81 year old right-handed man with a history of hypertension, hyperlipidemia, prostate cancer, presenting for 2 episodes of loss of consciousness that occurred in 10/2020 and most recently 03/02/2021. Both occurred while taking a nap after physical activity. Etiology unclear, seizures are a possibility, but will also refer to Cardiology for recurrent syncope. From a neurological standpoint, we will do an MRI brain with and without contrast and 1-hour EEG to assess for focal abnormalities that increase risk for recurrent seizures. If normal, a 24-hour EEG will be done. Continue Levetiracetam 500mg  BID for now. [place] driving laws were discussed with the patient, and he knows to stop driving after an episode of loss of consciousness until 6 months event-free. He is scheduled for eye surgery tomorrow, from the standpoint of seizures, there is no contraindication to medically necessary surgery. He should take all seizure medications on time, even if NPO for surgery. Follow-up after tests, call for any changes.   Thank you for allowing me to participate in the care of this patient. Please do not hesitate to call for any questions or concerns.   Ellouise Newer, M.D.  CC: Dr. Ralene Bathe, Dr. Elease Hashimoto

## 2021-03-06 NOTE — Patient Instructions (Signed)
Good to meet you.  Schedule MRI brain with and without contrast  2. Schedule 1-hour EEG. If normal, we will do a 24-hour EEG  3. Referral will be sent to Cardiology as well  4. Continue Levetiracetam (Keppra) 500mg  twice a day  5. Follow-up after tests, call for any changes   Seizure Precautions: 1. If medication has been prescribed for you to prevent seizures, take it exactly as directed.  Do not stop taking the medicine without talking to your doctor first, even if you have not had a seizure in a long time.   2. Avoid activities in which a seizure would cause danger to yourself or to others.  Don't operate dangerous machinery, swim alone, or climb in high or dangerous places, such as on ladders, roofs, or girders.  Do not drive unless your doctor says you may.  3. If you have any warning that you may have a seizure, lay down in a safe place where you can't hurt yourself.    4.  No driving for 6 months from last seizure, as per [facility].   Please refer to the following link on the [place] website for more information: http://[email or web address].org/[email or web address]/driving/drivingu.cfm   5.  Maintain good sleep hygiene. Avoid alcohol.  6.  Contact your doctor if you have any problems that may be related to the medicine you are taking.  7.  Call 911 and bring the patient back to the ED if:        A.  The seizure lasts longer than 5 minutes.       B.  The patient doesn't awaken shortly after the seizure  C.  The patient has new problems such as difficulty seeing, speaking or moving  D.  The patient was injured during the seizure  E.  The patient has a temperature over 102 F (39C)  F.  The patient vomited and now is having trouble breathing

## 2021-03-06 NOTE — Telephone Encounter (Addendum)
Drew Smith with [facility] is calling and pt  lives alone and he is afraid of falling and would like to see about getting shower chair. Please call pt . Pt is having cataract surgery tomorrow . Pt went to ED for seizure 03-02-2021

## 2021-03-07 DIAGNOSIS — H2511 Age-related nuclear cataract, right eye: Secondary | ICD-10-CM | POA: Diagnosis not present

## 2021-03-07 NOTE — Telephone Encounter (Signed)
Order has been placed. Left a detailed message on verified voice mail informing the patient.

## 2021-03-08 ENCOUNTER — Telehealth: Payer: Self-pay | Admitting: Pharmacist

## 2021-03-08 DIAGNOSIS — H2512 Age-related nuclear cataract, left eye: Secondary | ICD-10-CM | POA: Diagnosis not present

## 2021-03-08 NOTE — Chronic Care Management (AMB) (Signed)
Chronic Care Management Pharmacy Assistant   Name: Drew Smith  MRN: [id number] DOB: 06-Jan-1941  Reason for Encounter: Disease State / Hypertension Assessment Call   Conditions to be addressed/monitored: HTN   Recent office visits:  12/12/2020 Drew Littler MD - Patient was seen for community acquired pneumonia of right lung and an additional issue. No medication changes. No follow up noted.  11/28/2020 Drew Littler MD - Patient was seen for community acquired pneumonia of right lung and an additional issue. No medication changes. Follow up in 2 weeks.  Recent consult visits:  03/06/2021 Drew Newer MD (neurology) - Patient was seen for loss of consciousness. No medication changes. No medication changes. Follow up not noted.  Hospital visits:  Patient was seen at [facility] ED on 03/02/2021 due to seizure and contusion of right shoulder. Discharge date was 03/03/2021 after 7 hours.   New?Medications Started at [facility] Discharge:?? -started levETIRAcetam 500 mg Oral 2 times daily Medication Changes at Hospital Discharge: -No medication changes Medications Discontinued at Hospital Discharge: -No medications stopped Medications that remain the same after Hospital Discharge:??  -All other medications will remain the same.    Medications: Outpatient Encounter Medications as of 03/08/2021  Medication Sig   acyclovir (ZOVIRAX) 400 MG tablet TAKE 1 TABLET (400 MG TOTAL) BY MOUTH AS NEEDED.   amLODipine (NORVASC) 5 MG tablet Take 1 tablet (5 mg total) by mouth daily.   azelastine (ASTELIN) 0.1 % nasal spray Place 1 spray into both nostrils 2 (two) times daily. Use in each nostril as directed   ciclopirox (LOPROX) 0.77 % cream APPLY TO AFFECTED AREA(S) TWO TIMES A DAY AS DIRECTED   desonide (DESOWEN) 0.05 % cream Apply 1 application topically 2 (two) times daily as needed.   fluorouracil (EFUDEX) 5 % cream Apply topically as needed. Per Dr Joaquim Lai    gabapentin (NEURONTIN) 100 MG capsule Take one to two capsules by mouth at night as needed for back pain.   hydrochlorothiazide (MICROZIDE) 12.5 MG capsule Take 1 capsule (12.5 mg total) by mouth daily.   ibuprofen (ADVIL) 800 MG tablet Take 1 tablet (800 mg total) by mouth every 8 (eight) hours as needed.   levETIRAcetam (KEPPRA) 500 MG tablet Take 1 tablet (500 mg total) by mouth 2 (two) times daily.   levocetirizine (XYZAL) 5 MG tablet TAKE ONE TABLET BY MOUTH EVERY EVENING   Multiple Vitamins-Minerals (CENTRUM SILVER 50+MEN PO) Take 1 tablet by mouth daily as needed.   omeprazole (PRILOSEC) 20 MG capsule TAKE ONE CAPSULE BY MOUTH TWICE A DAY   Oxymetazoline HCl (NASAL SPRAY) 0.05 % SOLN Place into the nose.   traMADol (ULTRAM) 50 MG tablet Take 50 mg by mouth every 6 (six) hours as needed.   tretinoin (RETIN-A) 0.05 % cream    triamcinolone cream (KENALOG) 0.1 % Apply to affected areas two times a day as directed.   VITAMIN D, CHOLECALCIFEROL, PO Take 1 tablet by mouth every other day. 5000 units every other day   zinc gluconate 50 MG tablet Take 50 mg by mouth daily as needed.   No facility-administered encounter medications on file as of 03/08/2021.  Fill History: gatifloxacin 0.5% drops 01/30/2021 22   LEVOCETIRIZINE DIHYDROCHLORIDE  5 MG TABS 12/15/2018 90   AMLODIPINE 5 MG TABLET 02/27/2021 90   HYDROCHLOROTHIAZIDE 12.5 MG CAPSULE 02/27/2021 90   LEVETIRACETAM 500 MG TABLET 03/03/2021 30   Reviewed chart prior to disease state call. Spoke with patient regarding BP  Recent  Office Vitals: BP Readings from Last 3 Encounters:  03/06/21 (!) 154/75  03/03/21 (!) 156/76  12/12/20 (!) 142/70   Pulse Readings from Last 3 Encounters:  03/06/21 60  03/03/21 65  12/12/20 69    Wt Readings from Last 3 Encounters:  03/06/21 164 lb 3.2 oz (74.5 kg)  03/03/21 165 lb (74.8 kg)  12/12/20 165 lb 14.4 oz (75.3 kg)     Kidney Function Lab Results  Component Value Date/Time    CREATININE 1.09 03/02/2021 11:39 PM   CREATININE 0.89 02/09/2020 08:11 AM   GFR 80.97 02/09/2020 08:11 AM   GFRNONAA >60 03/02/2021 11:39 PM   GFRAA 124 10/28/2007 12:00 AM    BMP Latest Ref Rng & Units 03/02/2021 02/09/2020 04/05/2019  Glucose 70 - 99 mg/dL 129(H) 146(H) 107(H)  BUN 8 - 23 mg/dL 20 15 18   Creatinine 0.61 - 1.24 mg/dL 1.09 0.89 0.99  Sodium 135 - 145 mmol/L 133(L) 136 138  Potassium 3.5 - 5.1 mmol/L 3.8 4.1 4.0  Chloride 98 - 111 mmol/L 102 101 102  CO2 22 - 32 mmol/L 25 28 29   Calcium 8.9 - 10.3 mg/dL 8.9 9.0 9.0    Current antihypertensive regimen:  Amlodipine 5 mg daily  How often are you checking your Blood Pressure?  Patient is not checking blood pressures.   Current home BP readings: Patient is not checking blood pressures.   What recent interventions/DTPs have been made by any provider to improve Blood Pressure control since last CPP Visit: No recent interventions.  Any recent hospitalizations or ED visits since last visit with CPP? Patient had a ED visit on 03/02/2021  What diet changes have been made to improve Blood Pressure Control?  Patient is not following any special diet.  He typically eats a banana and cereal or bacon, eggs and toast for breakfast. He will eat a light lunch of leftovers and a meat and vegetable for dinner.   What exercise is being done to improve your Blood Pressure Control?  Patient states he does stretching, balance and strength exercises daily and will work on his golf swing.   Adherence Review: Is the patient currently on ACE/ARB medication? No Does the patient have >5 day gap between last estimated fill dates? No  Care Gaps: AWV - previous message sent to Drew Smith. Last BP - 154/75 on 03/06/2021 Covid-19 vaccine - never done  Star Rating Drugs: None  [facility] Pharmacist Assistant [phone]

## 2021-03-15 ENCOUNTER — Encounter: Payer: Self-pay | Admitting: Cardiology

## 2021-03-15 ENCOUNTER — Other Ambulatory Visit: Payer: Self-pay

## 2021-03-15 ENCOUNTER — Inpatient Hospital Stay: Payer: PPO

## 2021-03-15 ENCOUNTER — Ambulatory Visit: Payer: PPO | Admitting: Cardiology

## 2021-03-15 VITALS — BP 140/67 | HR 69 | Temp 97.2°F | Resp 16 | Ht 68.0 in | Wt 166.2 lb

## 2021-03-15 DIAGNOSIS — Z8669 Personal history of other diseases of the nervous system and sense organs: Secondary | ICD-10-CM | POA: Diagnosis not present

## 2021-03-15 DIAGNOSIS — I10 Essential (primary) hypertension: Secondary | ICD-10-CM | POA: Diagnosis not present

## 2021-03-15 DIAGNOSIS — E782 Mixed hyperlipidemia: Secondary | ICD-10-CM

## 2021-03-15 DIAGNOSIS — R4020 Unspecified coma: Secondary | ICD-10-CM

## 2021-03-15 DIAGNOSIS — R569 Unspecified convulsions: Secondary | ICD-10-CM | POA: Diagnosis not present

## 2021-03-15 NOTE — Progress Notes (Signed)
Date:  03/15/2021   ID:  Drew Smith, DOB [birth year], MRN [id number]  PCP:  [name], MD  Cardiologist:  Rex Kras, DO, [facility] (established care 03/15/2021)  REASON FOR CONSULT: Loss of consciousness [facility])  REQUESTING PHYSICIAN:  [name], MD [place],  [place] [address]  Chief Complaint  Patient presents with   Loss of Consciousness   Hypertension   New Patient (Initial Visit)    HPI  Drew Smith is a 81 y.o. Caucasian male who presents to the office with a chief complaint of " loss of consciousness." Patient's past medical history and cardiovascular risk factors include: HTN, HLD, prostate cancer.  He is referred to the office at the request of [name], MD for evaluation of loss of consciousness.  Patient is referred to the practice by neurology for evaluation and management of loss of consciousness x2 over the last 6 months.  The initial event happened on 11/16/2020 while he was at [facility] he was attending a dance festival.  On the index event patient states that he was dancing earlier in the morning, recalls having breakfast, and midday went to the club party (did not consume much alcohol), warm back to the room to rest.  He recalls laying on the bed and upon waking up he was surrounded by people.  His son was present at the time of the event who reported noise and found his father moaning and groaning for approximately 5 minutes.  After 10 minutes of regaining consciousness he was able to answer questions.  No focal neurological deficits, tongue biting, or loss of bowel or bladder function.  He was taken to the local hospital CT of the chest showed groundglass in the right lung concerning for atypical infection and was treated with antibiotics and Z-Pak.  The second episode happened on March 02, 2021 again after playing golf entire morning he comes back: After eating he takes a nap around 4 PM.  When he woke up he was  on the floor curled into blankets.  He had noticed some blood and therefore went to the ED for further evaluation and management.  He was evaluated by neurology started on antiseizure medications and underwent MRI as well.  He is now referred to cardiology for further evaluation and management.  He denies angina pectoris or heart failure symptoms.  No episodes of syncope growing up a family history of sudden cardiac death.  No family history of premature CAD.  FUNCTIONAL STATUS: Golfing 3 times a week.    ALLERGIES: No Known Allergies  MEDICATION LIST PRIOR TO VISIT: Current Meds  Medication Sig   acyclovir (ZOVIRAX) 400 MG tablet TAKE 1 TABLET (400 MG TOTAL) BY MOUTH AS NEEDED.   amLODipine (NORVASC) 5 MG tablet Take 1 tablet (5 mg total) by mouth daily.   azelastine (ASTELIN) 0.1 % nasal spray Place 1 spray into both nostrils 2 (two) times daily. Use in each nostril as directed   ciclopirox (LOPROX) 0.77 % cream APPLY TO AFFECTED AREA(S) TWO TIMES A DAY AS DIRECTED   desonide (DESOWEN) 0.05 % cream Apply 1 application topically 2 (two) times daily as needed.   gabapentin (NEURONTIN) 100 MG capsule Take one to two capsules by mouth at night as needed for back pain.   hydrochlorothiazide (MICROZIDE) 12.5 MG capsule Take 1 capsule (12.5 mg total) by mouth daily.   ibuprofen (ADVIL) 800 MG tablet Take 1 tablet (800 mg total) by mouth every  8 (eight) hours as needed.   levETIRAcetam (KEPPRA) 500 MG tablet Take 1 tablet (500 mg total) by mouth 2 (two) times daily.   levocetirizine (XYZAL) 5 MG tablet TAKE ONE TABLET BY MOUTH EVERY EVENING   Multiple Vitamins-Minerals (CENTRUM SILVER 50+MEN PO) Take 1 tablet by mouth daily as needed.   omeprazole (PRILOSEC) 20 MG capsule TAKE ONE CAPSULE BY MOUTH TWICE A DAY (Patient taking differently: 20 mg daily.)   Oxymetazoline HCl (NASAL SPRAY) 0.05 % SOLN Place into the nose.   traMADol (ULTRAM) 50 MG tablet Take 50 mg by mouth every 6 (six) hours as  needed.   tretinoin (RETIN-A) 0.05 % cream    VITAMIN D, CHOLECALCIFEROL, PO Take 1 tablet by mouth every other day. 5000 units every other day   zinc gluconate 50 MG tablet Take 50 mg by mouth daily as needed.     PAST MEDICAL HISTORY: Past Medical History:  Diagnosis Date   Arthritis    Asthma    Cancer ([place])    prostate   GERD (gastroesophageal reflux disease)    Guillain-Barre syndrome ([place]) 02/25/1998   Hyperlipidemia    Hypertension    Seizures ([place])     PAST SURGICAL HISTORY: Past Surgical History:  Procedure Laterality Date   CATARACT EXTRACTION     EYE SURGERY  02/26/1943   81 years old   HERNIA REPAIR  02/25/1993   NOSE SURGERY  02/26/1988   PROSTATE SURGERY  02/25/1998   seed inplants   SHOULDER SURGERY  02/25/2006    FAMILY HISTORY: The patient family history includes Alcohol abuse in his father; Diabetes in his brother; Hyperlipidemia in his brother; Stroke in his brother.  SOCIAL HISTORY:  The patient  reports that he has never smoked. He has never used smokeless tobacco. He reports current alcohol use of about 7.0 standard drinks per week. He reports that he does not use drugs.  REVIEW OF SYSTEMS: Review of Systems  Constitutional: Negative for chills and fever.  HENT:  Negative for hoarse voice and nosebleeds.   Eyes:  Negative for discharge, double vision and pain.  Cardiovascular:  Negative for chest pain, claudication, dyspnea on exertion, leg swelling, near-syncope, orthopnea, palpitations and paroxysmal nocturnal dyspnea.  Respiratory:  Negative for hemoptysis and shortness of breath.   Musculoskeletal:  Negative for muscle cramps and myalgias.  Gastrointestinal:  Negative for abdominal pain, constipation, diarrhea, hematemesis, hematochezia, melena, nausea and vomiting.  Neurological:  Positive for seizures. Negative for dizziness and light-headedness.       Loss of consciousness (September 2022, January 2023)   PHYSICAL EXAM: Vitals with BMI  03/15/2021 03/06/2021 03/03/2021  Height 5\' 8"  5\' 8"  -  Weight 166 lbs 3 oz 164 lbs 3 oz -  BMI 38.93 73.42 -  Systolic 876 811 572  Diastolic 67 75 76  Pulse 69 60 65   Orthostatic VS for the past 72 hrs (Last 3 readings):  Orthostatic BP Patient Position BP Location Cuff Size Orthostatic Pulse  03/15/21 1441 130/70 Standing Left Arm Normal 68  03/15/21 1440 125/68 Sitting Left Arm Normal 67  03/15/21 1439 126/68 Supine Left Arm Normal 62    CONSTITUTIONAL: Well-developed and well-nourished. No acute distress.  SKIN: Skin is warm and dry. No rash noted. No cyanosis. No pallor. No jaundice HEAD: Normocephalic and atraumatic.  EYES: No scleral icterus MOUTH/THROAT: Moist oral membranes.  NECK: No JVD present. No thyromegaly noted. No carotid bruits  LYMPHATIC: No visible cervical adenopathy.  CHEST Normal respiratory  effort. No intercostal retractions  LUNGS: Clear to auscultation bilaterally.  No stridor. No wheezes. No rales.  CARDIOVASCULAR: Regular rate and rhythm, positive S1-S2, no murmurs rubs or gallops appreciated. ABDOMINAL: Soft, nontender, nondistended, positive bowel sounds in all 4 quadrants, no apparent ascites.  EXTREMITIES: No peripheral edema, warm to touch, 2+ bilateral DP and PT pulses HEMATOLOGIC: No significant bruising NEUROLOGIC: Oriented to person, place, and time. Nonfocal. Normal muscle tone.  PSYCHIATRIC: Normal mood and affect. Normal behavior. Cooperative  CARDIAC DATABASE: EKG: 03/15/2021: Normal sinus rhythm, 64 bpm, consider old lateral infarct, without underlying ischemia or injury pattern.  Echocardiogram: No results found for this or any previous visit from the past 1095 days.    Stress Testing: No results found for this or any previous visit from the past 1095 days.   Heart Catheterization: None  LABORATORY DATA: CBC Latest Ref Rng & Units 03/02/2021 02/09/2020 04/05/2019  WBC 4.0 - 10.5 K/uL 11.1(H) 7.9 6.3  Hemoglobin 13.0 - 17.0 g/dL 14.9  13.8 14.9  Hematocrit 39.0 - 52.0 % 43.2 40.4 43.3  Platelets 150 - 400 K/uL 261 231.0 233.0    CMP Latest Ref Rng & Units 03/03/2021 03/02/2021 02/09/2020  Glucose 70 - 99 mg/dL - 129(H) 146(H)  BUN 8 - 23 mg/dL - 20 15  Creatinine 0.61 - 1.24 mg/dL - 1.09 0.89  Sodium 135 - 145 mmol/L - 133(L) 136  Potassium 3.5 - 5.1 mmol/L - 3.8 4.1  Chloride 98 - 111 mmol/L - 102 101  CO2 22 - 32 mmol/L - 25 28  Calcium 8.9 - 10.3 mg/dL - 8.9 9.0  Total Protein 6.5 - 8.1 g/dL 6.6 - 6.5  Total Bilirubin 0.3 - 1.2 mg/dL 0.8 - 0.6  Alkaline Phos 38 - 126 U/L 57 - 70  AST 15 - 41 U/L 24 - 12  ALT 0 - 44 U/L 16 - 11    Lipid Panel     Component Value Date/Time   CHOL 163 04/05/2019 1018   TRIG 103.0 04/05/2019 1018   HDL 47.60 04/05/2019 1018   CHOLHDL 3 04/05/2019 1018   VLDL 20.6 04/05/2019 1018   LDLCALC 95 04/05/2019 1018    No components found for: NTPROBNP No results for input(s): PROBNP in the last 8760 hours. Recent Labs    08/29/20 1611  TSH 1.63    BMP Recent Labs    03/02/21 2339  NA 133*  K 3.8  CL 102  CO2 25  GLUCOSE 129*  BUN 20  CREATININE 1.09  CALCIUM 8.9  GFRNONAA >60    HEMOGLOBIN A1C Lab Results  Component Value Date   HGBA1C 5.8 (A) 06/28/2020    IMPRESSION:    ICD-10-CM   1. Loss of consciousness ([place])  R40.20 EKG 12-Lead    PCV ECHOCARDIOGRAM COMPLETE    PCV CARDIAC STRESS TEST    LONG TERM MONITOR (3-14 DAYS)    PCV CAROTID DUPLEX (BILATERAL)    2. Seizure ([facility])  R56.9     3. Benign hypertension  I10 PCV CARDIAC STRESS TEST    4. Mixed hyperlipidemia  E78.2 PCV CARDIAC STRESS TEST    5. History of Guillain-Barre syndrome  Z86.69        RECOMMENDATIONS: Drew Smith is a 81 y.o. Caucasian male whose past medical history and cardiac risk factors include: HTN, HLD, prostate cancer.  Loss of consciousness [facility]) Patient has had 2 episodes where he is lost consciousness since September 2022. During both of those events he has  been at baseline until he falls asleep in the events that occurred either during or upon awakening. Orthostatic vital signs negative. EKG: Nonischemic Echocardiogram will be ordered to evaluate for structural heart disease and left ventricular systolic function. Plan exercise treadmill stress test to evaluate for chronotropic competence, exercise-induced arrhythmia, and exercise-induced ischemia. Carotid duplex to evaluate for atherosclerosis/stenosis. Patient has followed up with neurology currently on antiseizure medication. May consider sleep study to evaluate for sleep-related disorder as well.  We will discuss at the next visit  Benign hypertension Currently on amlodipine and hydrochlorothiazide. If symptoms continue may consider transitioning him to hydralazine.   FINAL MEDICATION LIST END OF ENCOUNTER: No orders of the defined types were placed in this encounter.   Medications Discontinued During This Encounter  Medication Reason   fluorouracil (EFUDEX) 5 % cream    triamcinolone cream (KENALOG) 0.1 %      Current Outpatient Medications:    acyclovir (ZOVIRAX) 400 MG tablet, TAKE 1 TABLET (400 MG TOTAL) BY MOUTH AS NEEDED., Disp: 90 tablet, Rfl: 2   amLODipine (NORVASC) 5 MG tablet, Take 1 tablet (5 mg total) by mouth daily., Disp: 90 tablet, Rfl: 3   azelastine (ASTELIN) 0.1 % nasal spray, Place 1 spray into both nostrils 2 (two) times daily. Use in each nostril as directed, Disp: 30 mL, Rfl: 12   ciclopirox (LOPROX) 0.77 % cream, APPLY TO AFFECTED AREA(S) TWO TIMES A DAY AS DIRECTED, Disp: 15 g, Rfl: 0   desonide (DESOWEN) 0.05 % cream, Apply 1 application topically 2 (two) times daily as needed., Disp: , Rfl:    gabapentin (NEURONTIN) 100 MG capsule, Take one to two capsules by mouth at night as needed for back pain., Disp: 60 capsule, Rfl: 3   hydrochlorothiazide (MICROZIDE) 12.5 MG capsule, Take 1 capsule (12.5 mg total) by mouth daily., Disp: 90 capsule, Rfl: 3   ibuprofen  (ADVIL) 800 MG tablet, Take 1 tablet (800 mg total) by mouth every 8 (eight) hours as needed., Disp: 30 tablet, Rfl: 0   levETIRAcetam (KEPPRA) 500 MG tablet, Take 1 tablet (500 mg total) by mouth 2 (two) times daily., Disp: 60 tablet, Rfl: 5   levocetirizine (XYZAL) 5 MG tablet, TAKE ONE TABLET BY MOUTH EVERY EVENING, Disp: 90 tablet, Rfl: 0   Multiple Vitamins-Minerals (CENTRUM SILVER 50+MEN PO), Take 1 tablet by mouth daily as needed., Disp: , Rfl:    omeprazole (PRILOSEC) 20 MG capsule, TAKE ONE CAPSULE BY MOUTH TWICE A DAY (Patient taking differently: 20 mg daily.), Disp: 180 capsule, Rfl: 0   Oxymetazoline HCl (NASAL SPRAY) 0.05 % SOLN, Place into the nose., Disp: , Rfl:    traMADol (ULTRAM) 50 MG tablet, Take 50 mg by mouth every 6 (six) hours as needed., Disp: , Rfl:    tretinoin (RETIN-A) 0.05 % cream, , Disp: , Rfl:    VITAMIN D, CHOLECALCIFEROL, PO, Take 1 tablet by mouth every other day. 5000 units every other day, Disp: , Rfl:    zinc gluconate 50 MG tablet, Take 50 mg by mouth daily as needed., Disp: , Rfl:   Orders Placed This Encounter  Procedures   PCV CARDIAC STRESS TEST   LONG TERM MONITOR (3-14 DAYS)   EKG 12-Lead   PCV ECHOCARDIOGRAM COMPLETE   PCV CAROTID DUPLEX (BILATERAL)    There are no Patient Instructions on file for this visit.   --Continue cardiac medications as reconciled in final medication list. --Return in about 6 weeks (around 04/26/2021) for Follow up LOC and ,  Review test results. Or sooner if needed. --Continue follow-up with your primary care physician regarding the management of your other chronic comorbid conditions.  Patient's questions and concerns were addressed to his satisfaction. He voices understanding of the instructions provided during this encounter.   This note was created using a voice recognition software as a result there may be grammatical errors inadvertently enclosed that do not reflect the nature of this encounter. Every attempt is  made to correct such errors.  Rex Kras, [place], [facility]  Pager: [phone] Office: [phone]

## 2021-03-26 ENCOUNTER — Telehealth: Payer: Self-pay | Admitting: Neurology

## 2021-03-26 ENCOUNTER — Other Ambulatory Visit: Payer: Self-pay

## 2021-03-26 MED ORDER — LEVETIRACETAM 500 MG PO TABS: 500.0000 mg | ORAL_TABLET | Freq: Two times a day (BID) | ORAL | 0 refills | Status: DC

## 2021-03-26 NOTE — Telephone Encounter (Signed)
1. Which medications need refilled? (List name and dosage, if known) levetiracetam  2. Which pharmacy/location is medication to be sent to? (include street and city if local pharmacy) [place]  3. Do they need a 30 day or 90 day supply? Pt wants a 90 day supply

## 2021-03-26 NOTE — Telephone Encounter (Signed)
Refill sent in for pt for one month

## 2021-03-27 ENCOUNTER — Other Ambulatory Visit: Payer: Self-pay

## 2021-03-27 ENCOUNTER — Ambulatory Visit: Payer: PPO

## 2021-03-27 DIAGNOSIS — R4020 Unspecified coma: Secondary | ICD-10-CM | POA: Diagnosis not present

## 2021-03-27 DIAGNOSIS — M5136 Other intervertebral disc degeneration, lumbar region: Secondary | ICD-10-CM | POA: Diagnosis not present

## 2021-03-27 DIAGNOSIS — Z09 Encounter for follow-up examination after completed treatment for conditions other than malignant neoplasm: Secondary | ICD-10-CM | POA: Diagnosis not present

## 2021-03-27 DIAGNOSIS — M9904 Segmental and somatic dysfunction of sacral region: Secondary | ICD-10-CM | POA: Diagnosis not present

## 2021-03-27 DIAGNOSIS — M9905 Segmental and somatic dysfunction of pelvic region: Secondary | ICD-10-CM | POA: Diagnosis not present

## 2021-03-27 DIAGNOSIS — I10 Essential (primary) hypertension: Secondary | ICD-10-CM | POA: Diagnosis not present

## 2021-03-27 DIAGNOSIS — M9903 Segmental and somatic dysfunction of lumbar region: Secondary | ICD-10-CM | POA: Diagnosis not present

## 2021-04-02 ENCOUNTER — Ambulatory Visit: Payer: PPO | Admitting: Neurology

## 2021-04-02 ENCOUNTER — Telehealth: Payer: Self-pay | Admitting: Neurology

## 2021-04-02 ENCOUNTER — Other Ambulatory Visit: Payer: Self-pay

## 2021-04-02 DIAGNOSIS — M9903 Segmental and somatic dysfunction of lumbar region: Secondary | ICD-10-CM | POA: Diagnosis not present

## 2021-04-02 DIAGNOSIS — M9905 Segmental and somatic dysfunction of pelvic region: Secondary | ICD-10-CM | POA: Diagnosis not present

## 2021-04-02 DIAGNOSIS — M9904 Segmental and somatic dysfunction of sacral region: Secondary | ICD-10-CM | POA: Diagnosis not present

## 2021-04-02 DIAGNOSIS — R4020 Unspecified coma: Secondary | ICD-10-CM | POA: Diagnosis not present

## 2021-04-02 DIAGNOSIS — M5136 Other intervertebral disc degeneration, lumbar region: Secondary | ICD-10-CM | POA: Diagnosis not present

## 2021-04-02 NOTE — Progress Notes (Signed)
Called pt to inform him about his results. Pt understood. Advise pt to keep his appt

## 2021-04-02 NOTE — Telephone Encounter (Signed)
1. Which medications need refilled? (List name and dosage, if known) levetiracetam 500 MG  2. Which pharmacy/location is medication to be sent to? (include street and city if local pharmacy) [facility], [place]  3. Do they need a 30 day or 90 day supply? 90 days  Second time requesting 90 day increments

## 2021-04-02 NOTE — Telephone Encounter (Signed)
Pt called no answer left a voice mail per DPR that his prescription was sent in to last until his appointment and then after his appointment we can send in a 90 day prescription. If they have any further questions or concerns they can call the office back at [phone]

## 2021-04-04 DIAGNOSIS — H2512 Age-related nuclear cataract, left eye: Secondary | ICD-10-CM | POA: Diagnosis not present

## 2021-04-05 DIAGNOSIS — H2512 Age-related nuclear cataract, left eye: Secondary | ICD-10-CM | POA: Diagnosis not present

## 2021-04-09 ENCOUNTER — Telehealth: Payer: Self-pay

## 2021-04-09 DIAGNOSIS — R4020 Unspecified coma: Secondary | ICD-10-CM | POA: Diagnosis not present

## 2021-04-09 NOTE — Telephone Encounter (Signed)
error 

## 2021-04-09 NOTE — Procedures (Signed)
ELECTROENCEPHALOGRAM REPORT  Date of Study: 04/02/2021  Patient's Name: Drew Smith MRN: [id number] Date of Birth: 25-Mar-1940  Referring Provider: Dr. Ellouise Newer  Clinical History: This is an 81 year old man with 2 episodes of loss of consciousness. EEG for classification.  Medications: ZOVIRAX 400 MG tablet NORVASC 5 MG tablet ASTELIN 0.1 % nasal spray LOPROX 0.77 % cream DESOWEN 0.05 % cream EFUDEX 5 % cream NEURONTIN 100 MG capsule MICROZIDE 12.5 MG capsule ADVIL 800 MG tablet KEPPRA 500 MG tablet XYZAL 5 MG tablet CENTRUM SILVER 50+MEN PO PRILOSEC 20 MG capsule NASAL SPRAY 0.05 % SOLN RETIN-A 0.05 % cream ULTRAM 50 MG tablet KENALOG 0.1 % VITAMIN D, CHOLECALCIFEROL, PO zinc  gl,uconate 50 MG tablet  Technical Summary: A multichannel digital 1-hour EEG recording measured by the international 10-20 system with electrodes applied with paste and impedances below 5000 ohms performed in our laboratory with EKG monitoring in an awake and asleep patient.  Hyperventilation was not performed. Photic stimulation was performed.  The digital EEG was referentially recorded, reformatted, and digitally filtered in a variety of bipolar and referential montages for optimal display.    Description: The patient is awake and asleep during the recording.  During maximal wakefulness, there is a symmetric, medium voltage 8 Hz posterior dominant rhythm that attenuates with eye opening.  The record is symmetric.  During drowsiness and sleep, there is an increase in theta slowing of the background.  Vertex waves and symmetric sleep spindles were seen.  Photic stimulation did not elicit any abnormalities.  There were no epileptiform discharges or electrographic seizures seen.    EKG lead was unremarkable.  Impression: This 1-hour awake and asleep EEG is normal.    Clinical Correlation: A normal EEG does not exclude a clinical diagnosis of epilepsy.  If further clinical questions remain,  prolonged EEG may be helpful.  Clinical correlation is advised.   Ellouise Newer, M.D.

## 2021-04-11 ENCOUNTER — Telehealth: Payer: Self-pay

## 2021-04-11 NOTE — Telephone Encounter (Signed)
-----   Message from Cameron Sprang, MD sent at 04/10/2021 12:53 PM EST ----- Pls let him know the EEG was normal, however it is not like a pregnancy test that is positive or negative, just a snapshot of his brain waves. Proceed with brain MRI (I don't see it scheduled), and 24-hour EEG as discussed, thanks

## 2021-04-11 NOTE — Telephone Encounter (Signed)
Pt called per DPR left a voice mail EEG was normal, however it is not like a pregnancy test that is positive or negative, just a snapshot of his brain waves. Proceed with brain MRI  and 24-hour EEG as discussed

## 2021-04-13 ENCOUNTER — Ambulatory Visit: Payer: PPO

## 2021-04-13 ENCOUNTER — Other Ambulatory Visit: Payer: Self-pay

## 2021-04-13 DIAGNOSIS — E782 Mixed hyperlipidemia: Secondary | ICD-10-CM

## 2021-04-13 DIAGNOSIS — I10 Essential (primary) hypertension: Secondary | ICD-10-CM | POA: Diagnosis not present

## 2021-04-13 DIAGNOSIS — R4020 Unspecified coma: Secondary | ICD-10-CM

## 2021-04-13 DIAGNOSIS — R55 Syncope and collapse: Secondary | ICD-10-CM | POA: Diagnosis not present

## 2021-04-18 ENCOUNTER — Other Ambulatory Visit: Payer: Self-pay

## 2021-04-18 ENCOUNTER — Ambulatory Visit: Payer: PPO | Admitting: Neurology

## 2021-04-18 ENCOUNTER — Encounter: Payer: Self-pay | Admitting: Neurology

## 2021-04-18 VITALS — BP 157/77 | HR 68 | Resp 18 | Ht 68.0 in | Wt 165.0 lb

## 2021-04-18 DIAGNOSIS — R4020 Unspecified coma: Secondary | ICD-10-CM

## 2021-04-18 MED ORDER — LEVETIRACETAM 500 MG PO TABS: 500.0000 mg | ORAL_TABLET | Freq: Two times a day (BID) | ORAL | 3 refills | Status: DC

## 2021-04-18 NOTE — Patient Instructions (Addendum)
Proceed with brain MRI with and without contrast  2. Continue Levetiracetam (Keppra) 500mg : take 1 tablet twice a day  3. Follow-up with Cardiology as scheduled  4. Follow-up in 3 months, call for any changes   Seizure Precautions: 1. If medication has been prescribed for you to prevent seizures, take it exactly as directed.  Do not stop taking the medicine without talking to your doctor first, even if you have not had a seizure in a long time.   2. Avoid activities in which a seizure would cause danger to yourself or to others.  Don't operate dangerous machinery, swim alone, or climb in high or dangerous places, such as on ladders, roofs, or girders.  Do not drive unless your doctor says you may.  3. If you have any warning that you may have a seizure, lay down in a safe place where you can't hurt yourself.    4.  No driving for 6 months from last seizure, as per [facility].   Please refer to the following link on the [place] website for more information: http://[email or web address]/[email or web address]   5.  Maintain good sleep hygiene. Avoid alcohol.  6.  Contact your doctor if you have any problems that may be related to the medicine you are taking.  7.  Call 911 and bring the patient back to the ED if:        A.  The seizure lasts longer than 5 minutes.       B.  The patient doesn't awaken shortly after the seizure  C.  The patient has new problems such as difficulty seeing, speaking or moving  D.  The patient was injured during the seizure  E.  The patient has a temperature over 102 F (39C)  F.  The patient vomited and now is having trouble breathing       We have sent a referral to [facility] for your MRI and they will call you directly to schedule your appointment. They are located at [place]. If you need to contact them directly please call [phone].

## 2021-04-18 NOTE — Progress Notes (Signed)
NEUROLOGY FOLLOW UP OFFICE NOTE  NURI LARMER [id number] 81 years old  HISTORY OF PRESENT ILLNESS: I had the pleasure of seeing Elzy Tomasello in follow-up in the neurology clinic on 04/18/2021. He is alone in the office today. The patient was last seen 6 weeks ago for 2 episodes of loss of consciousness. Records and images were personally reviewed where available. His 1-hour wake and sleep EEG done 03/2021 was normal. He has not done brain MRI yet. He was unsure about doing the prolonged EEG. He has kindly been evaluated by Cardiology and had an echocardiogram with EF 60-65%, moderate LVH. Exercise treadmill test was considered low risk study. He had a 2-week holter monitor with note of "min HR of 22 bpm, max HR of 146 bpm, and avg HR of 66 bpm. Predominant underlying rhythm was Sinus Rhythm. First Degree AV Block was present. Atrial Fibrillation/Flutter occurred (3% burden), ranging from 42-146 bpm (avg of 89 bpm), the longest lasting 7 hours 57 mins with an avg rate of 88 bpm. 6 Pauses occurred, the longest lasting 7.3 secs (8 bpm). Some Pauses occurred due to High Grade AV Block and Non-Conducted SVE(s). Second Degree AV Block-Mobitz I (Wenckebach) was present." He has not seen Cardiology yet to discuss holter results. He reports feeling good, no further episodes of loss of consciousness since 03/02/2021. He was started on Levetiracetam 500mg  BID in the ER and continues on medication with no side effects. He denies any staring/unresponsive episodes, loss of time, olfactory/gustatory hallucinations, focal numbness/tingling/weakness. No significant headaches, dizziness, vision changes, no falls. He works out regularly and does balance exercises.    History on Initial Assessment 03/06/2021: This is an 81 year old right-handed man with a history of hypertension, hyperlipidemia, prostate cancer, presenting for 2 episodes of loss of consciousness. He is accompanied by his son Lennette Bihari who helps supplement the  history today. The first event occurred on 11/16/20 while at [facility] and he was brought to [facility]. He was attending a dance festival for several days, he danced earlier that day and recalls having breakfast, went to a mid-day club party (did not drink alcohol), went back to his room to rest for a couple of hours. He recalls laying on the bed then waking up to people around him yelling, unable to understand them. Lennette Bihari reports he heard a noise and found his father moaning and groaning, he was not responding with eyes opening and closing "almost like having a bad dream." This lasted 5 minutes, he woke up and did not realize what was going on, it took about 10 minutes before he could answer orientation questions. No focal weakness, tongue bite or incontinence. Per notes, he was feverish, diaphoretic and EMS reported O2 sats in the 80s. Mental status improved en route to hospital. CT chest showed diffuse ground-glass in the right lung that can be seen in atypical particularly viral infections, WBC 13,  he was treated with Rocephin and Zmax and mental status returned to normal very rapidly. The next event occurred on 03/02/2021, again after a day of activity playing golf. He ate breakfast and had snacks, went home to clean up. He recalls feeling great when he lay down for a nap at 4pm, then woke up face down on the floor curled up in blankets around 6pm. He had difficulty getting up initially but managed to stand feeling woozy. His other son called and came, he felt fine but had a large abrasion on the bridge of his nose and bit  his tongue, so they decided to go to the ER. Bloodwork showed WBC 11.1, Na 133. UDS, EtOH negative. I personally reviewed head CT without contrast which did not show any acute changes. Symptoms felt concerning for seizure so he was discharged home on Levetiracetam 500mg  BID which makes him a little drowsy.  He has had brief body jerks in the past 3-5 years. He and his son deny any  staring/unresponsive episodes. He denies any olfactory/gustatory hallucinations, deja vu, rising epigastric sensation, focal numbness/tingling/weakness. He has a "little problem with balance" and does balance exercises regularly. He denies any headaches, diplopia, dysarthria/dysphagia, neck pain, bowel/bladder dysfunction. His back bothers him some. He feels his memory is pretty good for his age, he makes notes all the time. He denies missing medications, bill payments, or getting lost driving. 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 He had a normal birth and early development.  There is no history of febrile convulsions, CNS infections such as meningitis/encephalitis, significant traumatic brain injury, or family history of seizures. He drinks about 1.5 glasses of alcohol daily and recalls having a little more during the dance festival in 10/2020 and one beer after his golf game on 03/02/21. His son notes a fall 2 weeks ago after he had 4 drinks, he fell getting out of the car in the dark, no loss of consciousness. He reports high anxiety with very infrequent palpitations, no chest pain or shortness of breath.    PAST MEDICAL HISTORY: Past Medical History:  Diagnosis Date   Arthritis    Asthma    Cancer ([place])    prostate   GERD (gastroesophageal reflux disease)    Guillain-Barre syndrome ([place]) 02/25/1998   Hyperlipidemia    Hypertension    Seizures (HCC)     MEDICATIONS: Current Outpatient Medications on File Prior to Visit  Medication Sig Dispense Refill   acyclovir (ZOVIRAX) 400 MG tablet TAKE 1 TABLET (400 MG TOTAL) BY MOUTH AS NEEDED. 90 tablet 2   amLODipine (NORVASC) 5 MG tablet Take 1 tablet (5 mg total) by mouth daily. 90 tablet 3   azelastine (ASTELIN) 0.1 % nasal spray Place 1 spray into both nostrils 2 (two) times daily. Use in each nostril as directed 30 mL 12   ciclopirox (LOPROX) 0.77 % cream APPLY TO AFFECTED AREA(S)  TWO TIMES A DAY AS DIRECTED 15 g 0   desonide (DESOWEN) 0.05 % cream Apply 1 application topically 2 (two) times daily as needed.     gabapentin (NEURONTIN) 100 MG capsule Take one to two capsules by mouth at night as needed for back pain. 60 capsule 3   hydrochlorothiazide (MICROZIDE) 12.5 MG capsule Take 1 capsule (12.5 mg total) by mouth daily. 90 capsule 3   ibuprofen (ADVIL) 800 MG tablet Take 1 tablet (800 mg total) by mouth every 8 (eight) hours as needed. 30 tablet 0   levETIRAcetam (KEPPRA) 500 MG tablet Take 1 tablet (500 mg total) by mouth 2 (two) times daily. 60 tablet 0   levocetirizine (XYZAL) 5 MG tablet TAKE ONE TABLET BY MOUTH EVERY EVENING 90 tablet 0   Multiple Vitamins-Minerals (CENTRUM SILVER 50+MEN PO) Take 1 tablet by mouth daily as needed.     omeprazole (PRILOSEC) 20 MG capsule TAKE ONE CAPSULE BY MOUTH TWICE A DAY (Patient taking differently: 20 mg daily.) 180 capsule 0   Oxymetazoline HCl (NASAL SPRAY) 0.05 % SOLN Place into the nose.     traMADol (ULTRAM) 50  MG tablet Take 50 mg by mouth every 6 (six) hours as needed.     tretinoin (RETIN-A) 0.05 % cream      VITAMIN D, CHOLECALCIFEROL, PO Take 1 tablet by mouth every other day. 5000 units every other day     zinc gluconate 50 MG tablet Take 50 mg by mouth daily as needed.     No current facility-administered medications on file prior to visit.    ALLERGIES: No Known Allergies  FAMILY HISTORY: Family History  Problem Relation Age of Onset   Alcohol abuse Father    Hyperlipidemia Brother    Stroke Brother    Diabetes Brother        type ll    SOCIAL HISTORY: Social History   Socioeconomic History   Marital status: Divorced    Spouse name: Not on file   Number of children: 3   Years of education: Not on file   Highest education level: Some college, no degree  Occupational History   Occupation: retired  Tobacco Use   Smoking status: Never   Smokeless tobacco: Never  Vaping Use   Vaping Use: Never  used  Substance and Sexual Activity   Alcohol use: Yes    Alcohol/week: 7.0 standard drinks    Types: 7 Glasses of wine per week    Comment: have 1/2 a day; wine or other    Drug use: No   Sexual activity: Not Currently  Other Topics Concern   Not on file  Social History Narrative   Lives alone in one level. Has two sons 1 daughter and friends who serve as support.   Former Chiropodist; Enjoys Data processing manager, reading   Originally from [place]   Social Determinants of Health   Financial Resource Strain: Low Risk    Difficulty of Paying Living Expenses: Not hard at [employer] Insecurity: No Food Insecurity   Worried About Charity fundraiser in the Last Year: Never true   Arboriculturist in the Last Year: Never true  Transportation Needs: Not on file  Physical Activity: Sufficiently Active   Days of Exercise per Week: 5 days   Minutes of Exercise per Session: 60 min  Stress: No Stress Concern Present   Feeling of Stress : Not at all  Social Connections: Moderately Integrated   Frequency of Communication with Friends and Family: More than three times a week   Frequency of Social Gatherings with Friends and Family: More than three times a week   Attends Religious Services: 1 to 4 times per year   Active Member of [employer] or Organizations: Yes   Attends Music therapist: More than 4 times per year   Marital Status: Divorced  Human resources officer Violence: Not At Risk   Fear of Current or Ex-Partner: No   Emotionally Abused: No   Physically Abused: No   Sexually Abused: No     PHYSICAL EXAM: Vitals:   04/18/21 1409  BP: (!) 157/77  Pulse: 68  Resp: 18  SpO2: 95%   General: No acute distress Head:  Normocephalic/atraumatic Skin/Extremities: No rash, no edema Neurological Exam: alert and awake. No aphasia or dysarthria. Fund of knowledge is appropriate. Attention and concentration are normal.   Cranial nerves: Pupils equal, round. Extraocular movements intact with  no nystagmus. Visual fields full.  No facial asymmetry.  Motor: Bulk and tone normal, muscle strength 5/5 throughout with no pronator drift.   Finger to nose testing intact.  Gait slow  and cautious, no ataxia. No tremor.   IMPRESSION: This is an 81 yo RH man with a history of hypertension, hyperlipidemia, prostate cancer, with 2 episodes of loss of consciousness that occurred in 10/2020 and 03/02/2021. Both occurred while taking a nap after physical activity. Etiology unclear. From a neurological standpoint, his 1-hour EEG is normal, proceed with brain MRI with and without contrast. Continue Levetiracetam 500mg  BID for now. He has been evaluated by Cardiology, he is awaiting holter monitor results, per notes there were pauses due to high grade AV block, follow-up with Cardiology. If no cardiac cause found, we may consider proceeding with 24-hour EEG. He is aware of  driving laws to stop driving after an episode of loss of consciousness until 6 months event-free. Follow-up in 3 months,call for any changes.     Thank you for allowing me to participate in his care.  Please do not hesitate to call for any questions or concerns.    Ellouise Newer, M.D.   CC: Dr. Elease Hashimoto, Dr. Terri Skains

## 2021-04-19 NOTE — Progress Notes (Signed)
Called and spoke with patient regarding his stress test results.

## 2021-04-23 ENCOUNTER — Ambulatory Visit (INDEPENDENT_AMBULATORY_CARE_PROVIDER_SITE_OTHER): Payer: PPO | Admitting: Family Medicine

## 2021-04-23 ENCOUNTER — Encounter: Payer: Self-pay | Admitting: Family Medicine

## 2021-04-23 VITALS — BP 142/78 | HR 83 | Resp 16 | Ht 68.0 in | Wt 165.0 lb

## 2021-04-23 DIAGNOSIS — M79675 Pain in left toe(s): Secondary | ICD-10-CM | POA: Diagnosis not present

## 2021-04-23 DIAGNOSIS — I10 Essential (primary) hypertension: Secondary | ICD-10-CM | POA: Diagnosis not present

## 2021-04-23 DIAGNOSIS — M898X8 Other specified disorders of bone, other site: Secondary | ICD-10-CM

## 2021-04-23 DIAGNOSIS — L219 Seborrheic dermatitis, unspecified: Secondary | ICD-10-CM | POA: Diagnosis not present

## 2021-04-23 DIAGNOSIS — R6884 Jaw pain: Secondary | ICD-10-CM | POA: Diagnosis not present

## 2021-04-23 DIAGNOSIS — B351 Tinea unguium: Secondary | ICD-10-CM | POA: Diagnosis not present

## 2021-04-23 NOTE — Progress Notes (Signed)
ACUTE VISIT Chief Complaint  Patient presents with   pain in jaw    Severe x 2-3 days, may be dental related.   spot on chest   HPI: Drew Smith is a 81 y.o. male with hx of skin,HTN,GERD,and asthma here today with above complaints. He has a list of things he would like to address today.  Left-sided jaw pain, achy and sharp, it is present most of the time, and severe for the past couple days.. Exacerbated by opening mouth and palpation. Chronic cough, no more than usual. Denies CP and jaw pain is not radiated to LUE. No hx of trauma. Pain is stable. Taking Ibuprofen.  Teeth ache, intermittently. He is established with dentist.  -"Spot"on chest for 1-2 years, sometimes it feels bigger. It is not tender. No OTC treatment. He has a rash around area intermittently, mildly pruritic,he is not sure is related. "Spot" in left ear, he can feel it with his finger. It was noted 1-2 years ago, it feels "irritated." He is not sure if it has changed.  Hx of SCC follows with dermatologist annually. He is on Desonide cream, prescribed by his dermatologist.  -Toes pain left foot for 2-3 months, mainly around left 4th and 5th toenails. No hx of trauma but adds that he has had some falls.  He has pain most of the time. Exacerbated by walking and by some shoes. It interferes with playing golf. Pain is mild. He is not sure about erythema or edema. Pedicure last week helped. Tried topical alcohol and Vicks vapor rubs, both also helped.  -30-60 days of "nasty foul taste" in mouth, he wonders if it is caused by Keppra. Medication was recently added to treat new onset seizures. Last one in 02/2021, he is driving today, usually his son does but today he could not do so. He has not noted oral lesions or sore throat.  BP mildly elevated today. He is on Amlodipine 5 mg daily and HCTZ 12.5 mg daily. He is not checking BP at home. Lab Results  Component Value Date   CREATININE 1.09  03/02/2021   BUN 20 03/02/2021   NA 133 (L) 03/02/2021   K 3.8 03/02/2021   CL 102 03/02/2021   CO2 25 03/02/2021   Review of Systems  Constitutional:  Positive for fatigue. Negative for activity change, appetite change and fever.  HENT:  Negative for mouth sores and nosebleeds.   Eyes:  Negative for redness and visual disturbance.  Respiratory:  Negative for shortness of breath and wheezing.   Cardiovascular:  Negative for chest pain, palpitations and leg swelling.  Gastrointestinal:  Negative for abdominal pain, nausea and vomiting.  Genitourinary:  Negative for decreased urine volume, dysuria and hematuria.  Neurological:  Negative for syncope, weakness and headaches.  Rest see pertinent positives and negatives per HPI.  Current Outpatient Medications on File Prior to Visit  Medication Sig Dispense Refill   acyclovir (ZOVIRAX) 400 MG tablet TAKE 1 TABLET (400 MG TOTAL) BY MOUTH AS NEEDED. 90 tablet 2   amLODipine (NORVASC) 5 MG tablet Take 1 tablet (5 mg total) by mouth daily. 90 tablet 3   azelastine (ASTELIN) 0.1 % nasal spray Place 1 spray into both nostrils 2 (two) times daily. Use in each nostril as directed 30 mL 12   ciclopirox (LOPROX) 0.77 % cream APPLY TO AFFECTED AREA(S) TWO TIMES A DAY AS DIRECTED 15 g 0   desonide (DESOWEN) 0.05 % cream Apply 1 application topically 2 (two) times  daily as needed.     gabapentin (NEURONTIN) 100 MG capsule Take one to two capsules by mouth at night as needed for back pain. 60 capsule 3   hydrochlorothiazide (MICROZIDE) 12.5 MG capsule Take 1 capsule (12.5 mg total) by mouth daily. 90 capsule 3   ibuprofen (ADVIL) 800 MG tablet Take 1 tablet (800 mg total) by mouth every 8 (eight) hours as needed. 30 tablet 0   levETIRAcetam (KEPPRA) 500 MG tablet Take 1 tablet (500 mg total) by mouth 2 (two) times daily. 180 tablet 3   levocetirizine (XYZAL) 5 MG tablet TAKE ONE TABLET BY MOUTH EVERY EVENING 90 tablet 0   Multiple Vitamins-Minerals  (CENTRUM SILVER 50+MEN PO) Take 1 tablet by mouth daily as needed.     omeprazole (PRILOSEC) 20 MG capsule TAKE ONE CAPSULE BY MOUTH TWICE A DAY (Patient taking differently: 20 mg daily.) 180 capsule 0   Oxymetazoline HCl (NASAL SPRAY) 0.05 % SOLN Place into the nose.     traMADol (ULTRAM) 50 MG tablet Take 50 mg by mouth every 6 (six) hours as needed.     tretinoin (RETIN-A) 0.05 % cream      VITAMIN D, CHOLECALCIFEROL, PO Take 1 tablet by mouth every other day. 5000 units every other day     zinc gluconate 50 MG tablet Take 50 mg by mouth daily as needed.     No current facility-administered medications on file prior to visit.   Past Medical History:  Diagnosis Date   Arthritis    Asthma    Cancer ([place])    prostate   GERD (gastroesophageal reflux disease)    Guillain-Barre syndrome ([place]) 02/25/1998   Hyperlipidemia    Hypertension    Seizures (HCC)    No Known Allergies  Social History   Socioeconomic History   Marital status: Divorced    Spouse name: Not on file   Number of children: 3   Years of education: Not on file   Highest education level: Some college, no degree  Occupational History   Occupation: retired  Tobacco Use   Smoking status: Never   Smokeless tobacco: Never  Vaping Use   Vaping Use: Never used  Substance and Sexual Activity   Alcohol use: Yes    Alcohol/week: 7.0 standard drinks    Types: 7 Glasses of wine per week    Comment: have 1/2 a day; wine or other    Drug use: No   Sexual activity: Not Currently  Other Topics Concern   Not on file  Social History Narrative   Lives alone in one level. Has two sons 1 daughter and friends who serve as support.   Former Chiropodist; Enjoys Data processing manager, reading   Originally from [place]   Social Determinants of Health   Financial Resource Strain: Low Risk    Difficulty of Paying Living Expenses: Not hard at [employer] Insecurity: No Food Insecurity   Worried About Charity fundraiser in the Last  Year: Never true   Arboriculturist in the Last Year: Never true  Transportation Needs: Not on file  Physical Activity: Sufficiently Active   Days of Exercise per Week: 5 days   Minutes of Exercise per Session: 60 min  Stress: No Stress Concern Present   Feeling of Stress : Not at all  Social Connections: Moderately Integrated   Frequency of Communication with Friends and Family: More than three times a week   Frequency of Social Gatherings with Friends  and Family: More than three times a week   Attends Religious Services: 1 to 4 times per year   Active Member of Clubs or Organizations: Yes   Attends Archivist Meetings: More than 4 times per year   Marital Status: Divorced   Vitals:   04/23/21 1102  BP: (!) 142/78  Pulse: 83  Resp: 16  SpO2: 97%   Body mass index is 25.09 kg/m.  Physical Exam Vitals and nursing note reviewed.  Constitutional:      General: He is not in acute distress.    Appearance: He is well-developed.  HENT:     Head: Normocephalic and atraumatic.     Left Ear: Tympanic membrane and external ear normal.     Ears:      Mouth/Throat:     Mouth: Mucous membranes are moist.     Dentition: Abnormal dentition.     Pharynx: No oropharyngeal exudate or uvula swelling.     Comments: Tenderness upon palpation along left lower gum, no erythema.Abnormal dentition.Some pain upon palpation of mandible, no edema or erythema. Normal mouth opening, no crepitus. Eyes:     Conjunctiva/sclera: Conjunctivae normal.  Cardiovascular:     Rate and Rhythm: Normal rate and regular rhythm.     Heart sounds: No murmur heard.    Comments: DP pulses present. Pulmonary:     Effort: Pulmonary effort is normal. No respiratory distress.     Breath sounds: Normal breath sounds.  Chest:    Musculoskeletal:     Right lower leg: No edema.     Left lower leg: No edema.  Lymphadenopathy:     Cervical: No cervical adenopathy.  Skin:    General: Skin is warm.      Findings: Rash present. No erythema.          Comments: Left foot: Hypertrophic toenails,yellowish coloration. Great toenail with distal aspect detached from bed nail. No periungual edema or erythema. Not tender.  Neurological:     General: No focal deficit present.     Mental Status: He is alert and oriented to person, place, and time.     Gait: Gait normal.  Psychiatric:        Mood and Affect: Mood is anxious.     Comments: Well groomed, good eye contact.   ASSESSMENT AND PLAN:  Drew Smith was seen today for pain in jaw and spot on chest.  Diagnoses and all orders for this visit:  Jaw pain, non-TMJ We discussed possible etiologies. Examination suggest dental cause. Recommend arranging appt with dentist. We discussed possible complications of periodontal disease. Topical orajel recommended along left lower gum. Poor dentition can be contributing to "foul taste."  Toe pain, left No signs of synovitis. Pain seems to be around toenails, 4th and 5th toe. Hypertrophic toenails, which could be a contributing factor. Instructed to keep toenails trimmed down and to wear wide shoes. Topical Vicks vapor rub has helped, so continue.  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 I did not appreciate soft tissue mass but rather firm,? Bone prominence. We discussed possible etiologies, hx and examination do not suggest a serious process. CXR on 03/03/21 no sternal abnormality reported.  He will continue monitoring for changes.  Seborrheic dermatitis On upper chest and left ear canal. We discussed differential dx's. Recommend applying Desonide 0.05% bid prn. Continue following with dermatologist.  Primary hypertension BP mildly elevated today, We discussed some sdie effects of NSAID's. Recommend monitoring BP at home. Continue Amlodipine and HCTZ  same dose.  Onychomycosis of left great toe I do not recommend oral antimycotic medication. He can continue topical Vicks vapor rub.  I  spent a total of 46 minutes in both face to face and non face to face activities for this visit on the date of this encounter. During this time history was obtained and documented, examination was performed, prior lab reviewed, and assessment/plan discussed. Instructed to avoid driving for 6 months after last seizure. Recommend following with PCP to re-evaluate some of today;s concerns.  Return in about 4 weeks (around 05/21/2021) for PCP.  Hilaria Titsworth G. [place], MD  [facility]. [place] office.

## 2021-04-23 NOTE — Patient Instructions (Addendum)
A few things to remember from today's visit:   Hypertrophic toenail  Jaw pain, non-TMJ  Sternal mass  Seborrheic dermatitis  Primary hypertension  If you need refills please call your pharmacy. Do not use My Chart to request refills or for acute issues that need immediate attention.   You can apply desonide cream on chest rash and left ear, small amount. Follow with your dermatologist sooner if needed.  Upper chest prominence seems bone, continue monitoring for changes.  For toenails continue Vicks vapor rub on toenail at bedtime keep them trimmed.  Jaw pain seems from teeth, so recommend going to your dentist. For pain you can try orajel.Caution with Ibuprofen, it can elevate your blood pressure. Monitor blood pressure at home.  Avoid driving until 6 months seizure free.  Please be sure medication list is accurate. If a new problem present, please set up appointment sooner than planned today.

## 2021-04-27 ENCOUNTER — Ambulatory Visit: Payer: PPO | Admitting: Cardiology

## 2021-05-01 ENCOUNTER — Ambulatory Visit: Payer: PPO | Admitting: Cardiology

## 2021-05-01 ENCOUNTER — Telehealth: Payer: Self-pay

## 2021-05-01 ENCOUNTER — Encounter: Payer: Self-pay | Admitting: Cardiology

## 2021-05-01 ENCOUNTER — Other Ambulatory Visit: Payer: Self-pay

## 2021-05-01 VITALS — BP 159/69 | HR 67 | Temp 97.1°F | Resp 16 | Ht 68.0 in | Wt 167.0 lb

## 2021-05-01 DIAGNOSIS — R4020 Unspecified coma: Secondary | ICD-10-CM | POA: Diagnosis not present

## 2021-05-01 DIAGNOSIS — I10 Essential (primary) hypertension: Secondary | ICD-10-CM

## 2021-05-01 DIAGNOSIS — I480 Paroxysmal atrial fibrillation: Secondary | ICD-10-CM | POA: Diagnosis not present

## 2021-05-01 DIAGNOSIS — I4439 Other atrioventricular block: Secondary | ICD-10-CM | POA: Diagnosis not present

## 2021-05-01 NOTE — Progress Notes (Signed)
Date:  05/01/2021   ID:  Drew Smith, DOB 1940-12-16, MRN [id number]  PCP:  [name], MD  Cardiologist:  Rex Kras, DO, [facility] (established care 03/15/2021)  Date: 05/01/21 Last Office Visit: 03/15/2021  Chief Complaint  Patient presents with   Loss of Consciousness   Results    HPI  Drew Smith is a 81 y.o. Caucasian male who presents to the office with a chief complaint of " reevaluation of loss of consciousness and discuss test results." Patient's past medical history and cardiovascular risk factors include: HTN, HLD, prostate cancer.  He is referred to the office at the request of [name], MD for evaluation of loss of consciousness.  Patient is referred to the office for evaluation of loss of consciousness which is occurred twice since September 2022.  In addition to being evaluated by neurology cardiology was also consulted for additional work-up and recommendations.  At the last office visit that showed decision was to proceed with a carotid duplex, echocardiogram, GXT, and a 14-day extended Holter monitor.  Results of these diagnostic tests reviewed with him in great detail and noted below for further reference.  The 14-day extended Holter monitor noted to significant findings.  He is noted to have paroxysmal A-fib with a burden of approximately 3%.  In addition, he has several pauses/ventricular systole during the monitoring period.  Majority of these events are occurring while he is asleep I suspect that he has undiagnosed sleep apnea which needs to be addressed.  However, on 03/29/2021 at 8:20 AM while awake, he had episodes of high degree AV block with a 3.8-second pause.  Clinically patient denies any chest pain, heart failure symptoms, near-syncope, syncope.  He does feel tired and fatigued more than usual but still tries to keep an active lifestyle.  He played golf yesterday.  He is currently not driving.  He has an upcoming MRI of the brain  ordered by his neurologist for seizure work-up.  FUNCTIONAL STATUS: Golfing 3 times a week.    ALLERGIES: No Known Allergies  MEDICATION LIST PRIOR TO VISIT: Current Meds  Medication Sig   acyclovir (ZOVIRAX) 400 MG tablet TAKE 1 TABLET (400 MG TOTAL) BY MOUTH AS NEEDED.   amLODipine (NORVASC) 5 MG tablet Take 1 tablet (5 mg total) by mouth daily.   azelastine (ASTELIN) 0.1 % nasal spray Place 1 spray into both nostrils 2 (two) times daily. Use in each nostril as directed   ciclopirox (LOPROX) 0.77 % cream APPLY TO AFFECTED AREA(S) TWO TIMES A DAY AS DIRECTED   desonide (DESOWEN) 0.05 % cream Apply 1 application topically 2 (two) times daily as needed.   gabapentin (NEURONTIN) 100 MG capsule Take one to two capsules by mouth at night as needed for back pain.   hydrochlorothiazide (MICROZIDE) 12.5 MG capsule Take 1 capsule (12.5 mg total) by mouth daily.   ibuprofen (ADVIL) 800 MG tablet Take 1 tablet (800 mg total) by mouth every 8 (eight) hours as needed.   levETIRAcetam (KEPPRA) 500 MG tablet Take 1 tablet (500 mg total) by mouth 2 (two) times daily.   levocetirizine (XYZAL) 5 MG tablet TAKE ONE TABLET BY MOUTH EVERY EVENING   Multiple Vitamins-Minerals (CENTRUM SILVER 50+MEN PO) Take 1 tablet by mouth daily as needed.   omeprazole (PRILOSEC) 20 MG capsule TAKE ONE CAPSULE BY MOUTH TWICE A DAY (Patient taking differently: 20 mg daily.)   Oxymetazoline HCl (NASAL SPRAY) 0.05 % SOLN Place into the nose.  traMADol (ULTRAM) 50 MG tablet Take 50 mg by mouth every 6 (six) hours as needed.   tretinoin (RETIN-A) 0.05 % cream    VITAMIN D, CHOLECALCIFEROL, PO Take 1 tablet by mouth every other day. 5000 units every other day   zinc gluconate 50 MG tablet Take 50 mg by mouth daily as needed.     PAST MEDICAL HISTORY: Past Medical History:  Diagnosis Date   Arthritis    Asthma    Cancer ([place])    prostate   GERD (gastroesophageal reflux disease)    Guillain-Barre syndrome ([place])  02/25/1998   Hyperlipidemia    Hypertension    Seizures ([place])     PAST SURGICAL HISTORY: Past Surgical History:  Procedure Laterality Date   CATARACT EXTRACTION     EYE SURGERY  02/26/1943   81 years old   HERNIA REPAIR  02/25/1993   NOSE SURGERY  02/26/1988   PROSTATE SURGERY  02/25/1998   seed inplants   SHOULDER SURGERY  02/25/2006    FAMILY HISTORY: The patient family history includes Alcohol abuse in his father; Diabetes in his brother; Hyperlipidemia in his brother; Stroke in his brother.  SOCIAL HISTORY:  The patient  reports that he has never smoked. He has never used smokeless tobacco. He reports current alcohol use of about 7.0 standard drinks per week. He reports that he does not use drugs.  REVIEW OF SYSTEMS: Review of Systems  Constitutional: Positive for malaise/fatigue (chronic and stable.).  Cardiovascular:  Negative for chest pain, dyspnea on exertion, leg swelling, near-syncope, orthopnea, palpitations, paroxysmal nocturnal dyspnea and syncope.  Respiratory:  Negative for shortness of breath.    PHYSICAL EXAM: Vitals with BMI 05/01/2021 05/01/2021 04/23/2021  Height - '5\' 8"'$  '5\' 8"'$   Weight - 167 lbs 165 lbs  BMI - 32.6 71.24  Systolic 580 998 338  Diastolic 69 82 78  Pulse 67 71 83   CONSTITUTIONAL: Well-developed and well-nourished. No acute distress.  SKIN: Skin is warm and dry. No rash noted. No cyanosis. No pallor. No jaundice HEAD: Normocephalic and atraumatic.  EYES: No scleral icterus MOUTH/THROAT: Moist oral membranes.  NECK: No JVD present. No thyromegaly noted. No carotid bruits  LYMPHATIC: No visible cervical adenopathy.  CHEST Normal respiratory effort. No intercostal retractions  LUNGS: Clear to auscultation bilaterally.  No stridor. No wheezes. No rales.  CARDIOVASCULAR: Regular rate and rhythm, positive S1-S2, no murmurs rubs or gallops appreciated. ABDOMINAL: Soft, nontender, nondistended, positive bowel sounds in all 4 quadrants, no  apparent ascites.  EXTREMITIES: No peripheral edema, warm to touch, 2+ bilateral DP and PT pulses HEMATOLOGIC: No significant bruising NEUROLOGIC: Oriented to person, place, and time. Nonfocal. Normal muscle tone.  PSYCHIATRIC: Normal mood and affect. Normal behavior. Cooperative  CARDIAC DATABASE: EKG: 03/15/2021: Normal sinus rhythm, 64 bpm, consider old lateral infarct, without underlying ischemia or injury pattern.  Echocardiogram: 03/27/2021: Study Quality: Technically difficult study. Normal LV systolic function with visual EF 60-65%. Left ventricle cavity is normal in size. Moderate left ventricular hypertrophy, prominent basal septum (IVSd1.67cm and LVPWd 1.23cm). Normal global wall motion. Normal diastolic filling pattern, normal LAP.  Trace aortic regurgitation. Trace tricuspid regurgitation. No evidence of pulmonary hypertension. No prior study for comparison.   Stress Testing: Exercise treadmill stress test 04/13/2021: Exercise treadmill stress test performed using Bruce protocol. Patient reached 4.6 METS, and 87% of age predicted maximum heart rate. Exercise capacity was low. No chest pain reported. Normal heart rate response with hypertensive exercise response. Peak BP 220/80 mmHg. Stress  EKG shows no ischemic changes. Low risk study.  Heart Catheterization: None  Carotid artery duplex 03/27/2021: Duplex suggests stenosis in the right internal carotid artery (1-15%). Duplex suggests stenosis in the left internal carotid artery (1-15%). Mild diffuse homogeneous plaque noted in bilateral carotid arteries. Left carotid exhibits tortuosity and may be a source of bruit.  Antegrade right vertebral artery flow. Antegrade left vertebral artery flow. Follow up studies if clinically indicated.  LABORATORY DATA: CBC Latest Ref Rng & Units 03/02/2021 02/09/2020 04/05/2019  WBC 4.0 - 10.5 K/uL 11.1(H) 7.9 6.3  Hemoglobin 13.0 - 17.0 g/dL 14.9 13.8 14.9  Hematocrit 39.0 - 52.0 % 43.2  40.4 43.3  Platelets 150 - 400 K/uL 261 231.0 233.0    CMP Latest Ref Rng & Units 03/03/2021 03/02/2021 02/09/2020  Glucose 70 - 99 mg/dL - 129(H) 146(H)  BUN 8 - 23 mg/dL - 20 15  Creatinine 0.61 - 1.24 mg/dL - 1.09 0.89  Sodium 135 - 145 mmol/L - 133(L) 136  Potassium 3.5 - 5.1 mmol/L - 3.8 4.1  Chloride 98 - 111 mmol/L - 102 101  CO2 22 - 32 mmol/L - 25 28  Calcium 8.9 - 10.3 mg/dL - 8.9 9.0  Total Protein 6.5 - 8.1 g/dL 6.6 - 6.5  Total Bilirubin 0.3 - 1.2 mg/dL 0.8 - 0.6  Alkaline Phos 38 - 126 U/L 57 - 70  AST 15 - 41 U/L 24 - 12  ALT 0 - 44 U/L 16 - 11    Lipid Panel     Component Value Date/Time   CHOL 163 04/05/2019 1018   TRIG 103.0 04/05/2019 1018   HDL 47.60 04/05/2019 1018   CHOLHDL 3 04/05/2019 1018   VLDL 20.6 04/05/2019 1018   LDLCALC 95 04/05/2019 1018    No components found for: NTPROBNP No results for input(s): PROBNP in the last 8760 hours. Recent Labs    08/29/20 1611  TSH 1.63    BMP Recent Labs    03/02/21 2339  NA 133*  K 3.8  CL 102  CO2 25  GLUCOSE 129*  BUN 20  CREATININE 1.09  CALCIUM 8.9  GFRNONAA >60    HEMOGLOBIN A1C Lab Results  Component Value Date   HGBA1C 5.8 (A) 06/28/2020    IMPRESSION:    ICD-10-CM   1. High degree atrioventricular block  I44.39 Ambulatory referral to Cardiac Electrophysiology    Ambulatory referral to Sleep Studies    2. Loss of consciousness ([place])  R40.20 Ambulatory referral to Cardiac Electrophysiology    3. Paroxysmal atrial fibrillation ([facility])  I48.0     4. Benign hypertension  I10        RECOMMENDATIONS: RICHARDO POPOFF is a 81 y.o. Caucasian male whose past medical history and cardiac risk factors include: HTN, HLD, prostate cancer.  High degree atrioventricular block Noted on extended Holter monitor.[name] Spoke to Dr. Crissie Sickles over the phone with regards to the patient's findings and presentation.  Shared decision was to schedule outpatient consultation with him in the next 24 to  48 hours.  He will have the office organized as.  Consult has been placed.  I have encouraged the patient to call us back if he does not hear back from Dr. Tanna Furry office in the next 1 to 2 days. Continue current medical therapy Patient is educated on not driving or operating heavy machinery. If he has any other syncopal events in the interim he is asked to call EMS and go to the nearest ER  for further evaluation and management.  Loss of consciousness ([place]) Has had 2 episodes of loss of consciousness since September 2022. Since last office visit he has been asymptomatic. Echo: LVEF is preserved. GXT: Low risk study, hypertensive response to exercise. Carotid duplex: No significant carotid artery stenosis bilaterally. 14-day extended Holter monitor notes evidence of atrial fibrillation, conduction disease, high degree AV block.  Paroxysmal atrial fibrillation (H[facility]) Noted on extended Holter monitor as discussed above. We will avoid AV nodal blocking agents given his underlying conduction disease until pacemaker implantation is complete. We will hold off oral anticoagulation for now with anticipation of pacemaker implantation. CHA2DS2-VASc SCORE is 3 which correlates to 3.2% risk of stroke per year.  Benign hypertension Office blood pressures are not well controlled. Hypertensive response to exercise noted on GXT. Recommend up titration of antihypertensive medications status post pacemaker implantation. Continue amlodipine.  We will place consult for sleep medicine with Dr. Brett Fairy to evaluate for sleep apnea or sleep-related breathing disorders.  I also called the patient's neurologist Dr. Delice Lesch to see if he would be okay to postpone his MRI of the brain to 6 weeks post pacemaker implantation.  She is agreeable and her office will update the patient.  Total time spent at least 40 minutes discussing the new findings of atrial fibrillation and conduction disease/high degree AV block with  the patient at today's office visit.  Also reached out to cardiac electrophysiology Dr. Crissie Sickles to see if he should be sent to the ED for sleep apnea as outpatient.  EP consult has been placed and Dr. Lovena Le was kind enough to set up outpatient evaluation as well.  I also spoke to the patient's neurologist as mentioned above.  FINAL MEDICATION LIST END OF ENCOUNTER: No orders of the defined types were placed in this encounter.   There are no discontinued medications.    Current Outpatient Medications:    acyclovir (ZOVIRAX) 400 MG tablet, TAKE 1 TABLET (400 MG TOTAL) BY MOUTH AS NEEDED., Disp: 90 tablet, Rfl: 2   amLODipine (NORVASC) 5 MG tablet, Take 1 tablet (5 mg total) by mouth daily., Disp: 90 tablet, Rfl: 3   azelastine (ASTELIN) 0.1 % nasal spray, Place 1 spray into both nostrils 2 (two) times daily. Use in each nostril as directed, Disp: 30 mL, Rfl: 12   ciclopirox (LOPROX) 0.77 % cream, APPLY TO AFFECTED AREA(S) TWO TIMES A DAY AS DIRECTED, Disp: 15 g, Rfl: 0   desonide (DESOWEN) 0.05 % cream, Apply 1 application topically 2 (two) times daily as needed., Disp: , Rfl:    gabapentin (NEURONTIN) 100 MG capsule, Take one to two capsules by mouth at night as needed for back pain., Disp: 60 capsule, Rfl: 3   hydrochlorothiazide (MICROZIDE) 12.5 MG capsule, Take 1 capsule (12.5 mg total) by mouth daily., Disp: 90 capsule, Rfl: 3   ibuprofen (ADVIL) 800 MG tablet, Take 1 tablet (800 mg total) by mouth every 8 (eight) hours as needed., Disp: 30 tablet, Rfl: 0   levETIRAcetam (KEPPRA) 500 MG tablet, Take 1 tablet (500 mg total) by mouth 2 (two) times daily., Disp: 180 tablet, Rfl: 3   levocetirizine (XYZAL) 5 MG tablet, TAKE ONE TABLET BY MOUTH EVERY EVENING, Disp: 90 tablet, Rfl: 0   Multiple Vitamins-Minerals (CENTRUM SILVER 50+MEN PO), Take 1 tablet by mouth daily as needed., Disp: , Rfl:    omeprazole (PRILOSEC) 20 MG capsule, TAKE ONE CAPSULE BY MOUTH TWICE A DAY (Patient taking  differently: 20 mg daily.), Disp: 180  capsule, Rfl: 0   Oxymetazoline HCl (NASAL SPRAY) 0.05 % SOLN, Place into the nose., Disp: , Rfl:    traMADol (ULTRAM) 50 MG tablet, Take 50 mg by mouth every 6 (six) hours as needed., Disp: , Rfl:    tretinoin (RETIN-A) 0.05 % cream, , Disp: , Rfl:    VITAMIN D, CHOLECALCIFEROL, PO, Take 1 tablet by mouth every other day. 5000 units every other day, Disp: , Rfl:    zinc gluconate 50 MG tablet, Take 50 mg by mouth daily as needed., Disp: , Rfl:   Orders Placed This Encounter  Procedures   Ambulatory referral to Cardiac Electrophysiology   Ambulatory referral to Sleep Studies    There are no Patient Instructions on file for this visit.   --Continue cardiac medications as reconciled in final medication list. --Return in about 4 weeks (around 05/29/2021) for Follow up LOC, s/p pacemaker. . Or sooner if needed. --Continue follow-up with your primary care physician regarding the management of your other chronic comorbid conditions.  Patient's questions and concerns were addressed to his satisfaction. He voices understanding of the instructions provided during this encounter.   This note was created using a voice recognition software as a result there may be grammatical errors inadvertently enclosed that do not reflect the nature of this encounter. Every attempt is made to correct such errors.  Rex Kras, [place], [facility]  Pager: [phone] Office: [phone]

## 2021-05-01 NOTE — Telephone Encounter (Signed)
Pt called no answer left a voice mail to call the office back. When he calls back we need to let him know his cardiologist spoke to Dr Delice Lesch and we agreed to address his heart issues first, pls have him call [facility] to cancel brain MRI for now, we can see if he still needs it after he is done with cardiology procedures ?

## 2021-05-01 NOTE — Telephone Encounter (Signed)
Patient aware and will cancel MRI for now, [facility] number provided. ?

## 2021-05-04 ENCOUNTER — Inpatient Hospital Stay (HOSPITAL_COMMUNITY)
Admission: EM | Admit: 2021-05-04 | Discharge: 2021-05-07 | DRG: 309 | Disposition: A | Discharge status: Home / Self Care | Payer: PPO | Attending: Internal Medicine | Admitting: Internal Medicine

## 2021-05-04 ENCOUNTER — Other Ambulatory Visit: Payer: Self-pay

## 2021-05-04 ENCOUNTER — Encounter: Payer: Self-pay | Admitting: *Deleted

## 2021-05-04 ENCOUNTER — Emergency Department (HOSPITAL_COMMUNITY): Payer: PPO

## 2021-05-04 ENCOUNTER — Encounter: Payer: Self-pay | Admitting: Internal Medicine

## 2021-05-04 ENCOUNTER — Encounter (HOSPITAL_COMMUNITY): Payer: Self-pay

## 2021-05-04 ENCOUNTER — Ambulatory Visit: Payer: PPO | Admitting: Internal Medicine

## 2021-05-04 VITALS — BP 143/78 | HR 76 | Ht 69.0 in | Wt 165.0 lb

## 2021-05-04 DIAGNOSIS — Z79899 Other long term (current) drug therapy: Secondary | ICD-10-CM

## 2021-05-04 DIAGNOSIS — Z8546 Personal history of malignant neoplasm of prostate: Secondary | ICD-10-CM | POA: Diagnosis not present

## 2021-05-04 DIAGNOSIS — I442 Atrioventricular block, complete: Secondary | ICD-10-CM | POA: Diagnosis not present

## 2021-05-04 DIAGNOSIS — G40909 Epilepsy, unspecified, not intractable, without status epilepticus: Secondary | ICD-10-CM | POA: Diagnosis present

## 2021-05-04 DIAGNOSIS — G610 Guillain-Barre syndrome: Secondary | ICD-10-CM | POA: Diagnosis present

## 2021-05-04 DIAGNOSIS — R42 Dizziness and giddiness: Principal | ICD-10-CM

## 2021-05-04 DIAGNOSIS — I480 Paroxysmal atrial fibrillation: Secondary | ICD-10-CM | POA: Diagnosis not present

## 2021-05-04 DIAGNOSIS — E785 Hyperlipidemia, unspecified: Secondary | ICD-10-CM | POA: Diagnosis not present

## 2021-05-04 DIAGNOSIS — Z833 Family history of diabetes mellitus: Secondary | ICD-10-CM | POA: Diagnosis not present

## 2021-05-04 DIAGNOSIS — I4439 Other atrioventricular block: Secondary | ICD-10-CM | POA: Diagnosis not present

## 2021-05-04 DIAGNOSIS — G522 Disorders of vagus nerve: Secondary | ICD-10-CM | POA: Diagnosis present

## 2021-05-04 DIAGNOSIS — Z7901 Long term (current) use of anticoagulants: Secondary | ICD-10-CM | POA: Diagnosis not present

## 2021-05-04 DIAGNOSIS — K219 Gastro-esophageal reflux disease without esophagitis: Secondary | ICD-10-CM | POA: Diagnosis present

## 2021-05-04 DIAGNOSIS — Z823 Family history of stroke: Secondary | ICD-10-CM

## 2021-05-04 DIAGNOSIS — I422 Other hypertrophic cardiomyopathy: Secondary | ICD-10-CM | POA: Diagnosis present

## 2021-05-04 DIAGNOSIS — Z20822 Contact with and (suspected) exposure to covid-19: Secondary | ICD-10-CM | POA: Diagnosis present

## 2021-05-04 DIAGNOSIS — E876 Hypokalemia: Secondary | ICD-10-CM | POA: Diagnosis not present

## 2021-05-04 DIAGNOSIS — Z87898 Personal history of other specified conditions: Secondary | ICD-10-CM | POA: Diagnosis not present

## 2021-05-04 DIAGNOSIS — R55 Syncope and collapse: Secondary | ICD-10-CM | POA: Diagnosis not present

## 2021-05-04 DIAGNOSIS — R4020 Unspecified coma: Secondary | ICD-10-CM

## 2021-05-04 DIAGNOSIS — J45909 Unspecified asthma, uncomplicated: Secondary | ICD-10-CM | POA: Diagnosis not present

## 2021-05-04 DIAGNOSIS — R110 Nausea: Secondary | ICD-10-CM | POA: Diagnosis not present

## 2021-05-04 DIAGNOSIS — I10 Essential (primary) hypertension: Secondary | ICD-10-CM | POA: Diagnosis not present

## 2021-05-04 DIAGNOSIS — R531 Weakness: Secondary | ICD-10-CM | POA: Diagnosis not present

## 2021-05-04 DIAGNOSIS — R9431 Abnormal electrocardiogram [ECG] [EKG]: Secondary | ICD-10-CM | POA: Diagnosis not present

## 2021-05-04 LAB — CBC WITH DIFFERENTIAL/PLATELET
Abs Immature Granulocytes: 0.02 10*3/uL (ref 0.00–0.07)
Basophils Absolute: 0 10*3/uL (ref 0.0–0.1)
Basophils Relative: 0 %
Eosinophils Absolute: 0.2 10*3/uL (ref 0.0–0.5)
Eosinophils Relative: 3 %
HCT: 40.4 % (ref 39.0–52.0)
Hemoglobin: 14.3 g/dL (ref 13.0–17.0)
Immature Granulocytes: 0 %
Lymphocytes Relative: 16 %
Lymphs Abs: 1.1 10*3/uL (ref 0.7–4.0)
MCH: 32.4 pg (ref 26.0–34.0)
MCHC: 35.4 g/dL (ref 30.0–36.0)
MCV: 91.6 fL (ref 80.0–100.0)
Monocytes Absolute: 0.6 10*3/uL (ref 0.1–1.0)
Monocytes Relative: 9 %
Neutro Abs: 4.8 10*3/uL (ref 1.7–7.7)
Neutrophils Relative %: 72 %
Platelets: 240 10*3/uL (ref 150–400)
RBC: 4.41 MIL/uL (ref 4.22–5.81)
RDW: 12.2 % (ref 11.5–15.5)
WBC: 6.8 10*3/uL (ref 4.0–10.5)
nRBC: 0 % (ref 0.0–0.2)

## 2021-05-04 LAB — COMPREHENSIVE METABOLIC PANEL
ALT: 14 U/L (ref 0–44)
AST: 19 U/L (ref 15–41)
Albumin: 3.6 g/dL (ref 3.5–5.0)
Alkaline Phosphatase: 57 U/L (ref 38–126)
Anion gap: 10 (ref 5–15)
BUN: 19 mg/dL (ref 8–23)
CO2: 24 mmol/L (ref 22–32)
Calcium: 8.8 mg/dL — ABNORMAL LOW (ref 8.9–10.3)
Chloride: 100 mmol/L (ref 98–111)
Creatinine, Ser: 1.13 mg/dL (ref 0.61–1.24)
GFR, Estimated: >60 mL/min (ref >60)
Glucose, Bld: 196 mg/dL — ABNORMAL HIGH (ref 70–99)
Potassium: 3.8 mmol/L (ref 3.5–5.1)
Sodium: 134 mmol/L — ABNORMAL LOW (ref 135–145)
Total Bilirubin: 0.5 mg/dL (ref 0.3–1.2)
Total Protein: 6.6 g/dL (ref 6.5–8.1)

## 2021-05-04 LAB — TROPONIN I (HIGH SENSITIVITY)
Troponin I (High Sensitivity): 11 ng/L (ref <18)
Troponin I (High Sensitivity): 18 ng/L — ABNORMAL HIGH (ref <18)

## 2021-05-04 LAB — MAGNESIUM: Magnesium: 1.8 mg/dL (ref 1.7–2.4)

## 2021-05-04 LAB — CBG MONITORING, ED: Glucose-Capillary: 206 mg/dL — ABNORMAL HIGH (ref 70–99)

## 2021-05-04 MED ORDER — GABAPENTIN 100 MG PO CAPS
100.0000 mg | ORAL_CAPSULE | Freq: Every evening | ORAL | Status: DC | PRN
  Administered 2021-05-06: 100 mg via ORAL
  Filled 2021-05-04: qty 1

## 2021-05-04 MED ORDER — LEVETIRACETAM 500 MG PO TABS
500.0000 mg | ORAL_TABLET | Freq: Two times a day (BID) | ORAL | Status: DC
  Administered 2021-05-05 – 2021-05-07 (×5): 500 mg via ORAL
  Filled 2021-05-04 (×6): qty 1

## 2021-05-04 MED ORDER — ENOXAPARIN SODIUM 40 MG/0.4ML IJ SOSY
40.0000 mg | PREFILLED_SYRINGE | INTRAMUSCULAR | Status: DC
  Administered 2021-05-05 – 2021-05-06 (×3): 40 mg via SUBCUTANEOUS
  Filled 2021-05-04 (×3): qty 0.4

## 2021-05-04 MED ORDER — PANTOPRAZOLE SODIUM 40 MG PO TBEC
40.0000 mg | DELAYED_RELEASE_TABLET | Freq: Every day | ORAL | Status: DC
Start: 2021-05-05 — End: 2021-05-07
  Administered 2021-05-05 – 2021-05-07 (×3): 40 mg via ORAL
  Filled 2021-05-04 (×3): qty 1

## 2021-05-04 MED ORDER — HYDROCHLOROTHIAZIDE 12.5 MG PO TABS
12.5000 mg | ORAL_TABLET | Freq: Every day | ORAL | Status: DC
Start: 2021-05-05 — End: 2021-05-07
  Administered 2021-05-05 – 2021-05-07 (×3): 12.5 mg via ORAL
  Filled 2021-05-04 (×3): qty 1

## 2021-05-04 MED ORDER — AMLODIPINE BESYLATE 5 MG PO TABS
5.0000 mg | ORAL_TABLET | Freq: Every day | ORAL | Status: DC
  Administered 2021-05-05 – 2021-05-07 (×3): 5 mg via ORAL
  Filled 2021-05-04 (×3): qty 1

## 2021-05-04 MED ORDER — LACTATED RINGERS IV BOLUS
1000.0000 mL | Freq: Once | INTRAVENOUS | Status: AC
Start: 2021-05-04 — End: 2021-05-04
  Administered 2021-05-04: 1000 mL via INTRAVENOUS

## 2021-05-04 NOTE — ED Provider Notes (Cosign Needed)
[facility] EMERGENCY DEPARTMENT  Provider Note  CSN: [id number] Arrival date & time: 05/04/21 1959  History Chief Complaint  Patient presents with   Weakness        Dizziness    Drew Smith is a 81 y.o. male who presents today with dizziness.  Patient was at a church event when he began to feel very dizzy and lightheaded while standing in line.  He continued to experience the symptoms on 2 separate occasions despite eating food and drinking sweet tea.  He has never had symptoms like this before.  He has recently been diagnosed with a seizure disorder and is on Keppra.  He also has been told that he has an irregular heart rhythm by his cardiologist.  They recommended pacemaker placement this morning.   Home Medications Prior to Admission medications   Medication Sig Start Date End Date Taking? Authorizing Provider  amLODipine (NORVASC) 5 MG tablet Take 1 tablet (5 mg total) by mouth daily. 07/03/20  Yes Burchette, Alinda Sierras, MD  ciclopirox (LOPROX) 0.77 % cream APPLY TO AFFECTED AREA(S) TWO TIMES A DAY AS DIRECTED Patient taking differently: Apply 1 application. topically 2 (two) times daily. 12/16/19  Yes Burchette, Alinda Sierras, MD  desonide (DESOWEN) 0.05 % cream Apply 1 application. topically 2 (two) times daily.   Yes [provider]  gabapentin (NEURONTIN) 100 MG capsule Take one to two capsules by mouth at night as needed for back pain. 03/07/20  Yes Burchette, Alinda Sierras, MD  hydrochlorothiazide (MICROZIDE) 12.5 MG capsule Take 1 capsule (12.5 mg total) by mouth daily. 07/03/20  Yes Burchette, Alinda Sierras, MD  ibuprofen (ADVIL) 800 MG tablet Take 1 tablet (800 mg total) by mouth every 8 (eight) hours as needed. Patient taking differently: Take 800 mg by mouth every 8 (eight) hours as needed for mild pain. 03/17/20  Yes Burchette, Alinda Sierras, MD  levETIRAcetam (KEPPRA) 500 MG tablet Take 1 tablet (500 mg total) by mouth 2 (two) times daily. 04/18/21  Yes Cameron Sprang, MD  Multiple Vitamins-Minerals (CENTRUM SILVER 50+MEN PO) Take 1 tablet by mouth daily as needed.   Yes [provider]  omeprazole (PRILOSEC) 20 MG capsule TAKE ONE CAPSULE BY MOUTH TWICE A DAY Patient taking differently: 20 mg daily. 01/23/21  Yes Burchette, Alinda Sierras, MD  VITAMIN D, CHOLECALCIFEROL, PO Take 1 tablet by mouth every other day. 5000 units every other day   Yes [provider]  acyclovir (ZOVIRAX) 400 MG tablet TAKE 1 TABLET (400 MG TOTAL) BY MOUTH AS NEEDED. Patient not taking: Reported on 05/04/2021 01/10/13   [name], MD  azelastine (ASTELIN) 0.1 % nasal spray Place 1 spray into both nostrils 2 (two) times daily. Use in each nostril as directed Patient not taking: Reported on 05/04/2021 03/19/16   [name], MD  levocetirizine (XYZAL) 5 MG tablet TAKE ONE TABLET BY MOUTH EVERY EVENING Patient not taking: Reported on 05/04/2021 12/15/18   [name], MD  Oxymetazoline HCl (NASAL SPRAY) 0.05 % SOLN Place into the nose. Patient not taking: Reported on 05/04/2021    [provider]  traMADol (ULTRAM) 50 MG tablet Take 50 mg by mouth every 6 (six) hours as needed. Patient not taking: Reported on 05/04/2021    [provider]  tretinoin (RETIN-A) 0.05 % cream  10/18/19   [provider]  zinc gluconate 50 MG tablet Take 50 mg by mouth daily as needed. Patient not taking: Reported on  05/04/2021    [provider]     Allergies    Patient has no known allergies.   Review of Systems   Review of Systems  Constitutional:  Negative for chills and fever.  HENT:  Negative for ear pain and sore throat.   Eyes:  Negative for pain and visual disturbance.  Respiratory:  Negative for cough and shortness of breath.   Cardiovascular:  Negative for chest pain and palpitations.  Gastrointestinal:  Negative for abdominal pain and vomiting.  Genitourinary:  Negative for dysuria and hematuria.  Musculoskeletal:   Negative for arthralgias and back pain.  Skin:  Negative for color change and rash.  Neurological:  Positive for weakness. Negative for seizures and syncope.  All other systems reviewed and are negative. Please see HPI for pertinent positives and negatives  Physical Exam BP (!) 152/76    Pulse 66    Temp 98.1 F (36.7 C) (Oral)    Resp 10    Ht '5\' 8"'$  (1.727 m)    Wt 74.8 kg    SpO2 96%    BMI 25.09 kg/m   Physical Exam Vitals and nursing note reviewed.  Constitutional:      General: He is not in acute distress.    Appearance: He is well-developed.  HENT:     Head: Normocephalic and atraumatic.  Eyes:     Conjunctiva/sclera: Conjunctivae normal.  Cardiovascular:     Rate and Rhythm: Normal rate and regular rhythm.     Heart sounds: No murmur heard. Pulmonary:     Effort: Pulmonary effort is normal. No respiratory distress.     Breath sounds: Normal breath sounds.  Abdominal:     Palpations: Abdomen is soft.     Tenderness: There is no abdominal tenderness.  Musculoskeletal:        General: No swelling.     Cervical back: Neck supple.  Skin:    General: Skin is warm and dry.     Capillary Refill: Capillary refill takes less than 2 seconds.  Neurological:     General: No focal deficit present.     Mental Status: He is alert and oriented to person, place, and time. Mental status is at baseline.     Cranial Nerves: No cranial nerve deficit.     Sensory: No sensory deficit.     Motor: No weakness.     Coordination: Coordination normal.     Gait: Gait normal.     Deep Tendon Reflexes: Reflexes normal.  Psychiatric:        Mood and Affect: Mood normal.    ED Results / Procedures / Treatments   EKG EKG Interpretation  Date/Time:  Friday May 04 2021 20:07:13 EST Ventricular Rate:  78 PR Interval:  188 QRS Duration: 105 QT Interval:  396 QTC Calculation: 452 R Axis:   -21 Text Interpretation: Sinus rhythm LVH with secondary repolarization abnormality No significant  change since last tracing Confirmed by Wandra Arthurs [phone]) on 05/04/2021 8:45:47 PM  Procedures Procedures  Medications Ordered in the ED Medications  enoxaparin (LOVENOX) injection 40 mg (has no administration in time range)  amLODipine (NORVASC) tablet 5 mg (has no administration in time range)  hydrochlorothiazide (HYDRODIURIL) tablet 12.5 mg (has no administration in time range)  pantoprazole (PROTONIX) EC tablet 40 mg (has no administration in time range)  gabapentin (NEURONTIN) capsule 100 mg (has no administration in time range)  levETIRAcetam (KEPPRA) tablet 500 mg (has no administration in time range)  lactated  ringers bolus 1,000 mL (0 mLs Intravenous Stopped 05/04/21 2125)     ED Course       MDM   This patient presents to the ED for concern of lightheadedness, this involves an extensive number of treatment options, and is a complaint that carries with it a high risk of complications and morbidity.  The differential diagnosis includes cardiac dysrhythmia, metabolic abnormality.    Additional history obtained: Records reviewed Care Everywhere/External Records and Primary Care Documents  Lab Tests: I Ordered, and personally interpreted labs.  The pertinent results include: Labs were reassuring.  No acute abnormalities.  Imaging Studies ordered: I ordered imaging studies including X-ray chest   I independently visualized and interpreted imaging which showed no acute abnormalities I agree with the radiologist interpretation  EKG (personally reviewed and interpreted): Dysrhythmia.  No arrhythmia.  No STEMI.  No ischemia.   Medical Decision Making: Patient presented with what sounds like presyncope.  He became very lightheaded and dizzy while standing in line.  He has never had symptoms like this before.  He had been walking and standing for some period of time, however there was no acute change in position.  He continued to have symptoms despite eating and drinking some  sweet tea making hypoglycemia unlikely.  Based on chart review, patient has previously worn a Zio patch and has seen cardiology.  He has had episodes where he goes into complete heart block at various times.  They recommended possible pacemaker placement.  Concerned that he may have gone into a abnormal rhythm today during these episodes.  As such, I feel patient would be safe for staying in the hospital on telemetry versus discharging home.  I spoke with Dr. Flossie Buffy who accepted for admission. Cardiology to see.  Complexity of problems addressed: Patients presentation is most consistent with  acute presentation with potential threat to life or bodily function  Disposition: After consideration of the diagnostic results and the patients response to treatment,  I feel that the patent would benefit from admission to the hospital .   Patient seen in conjunction with my attending, Dr. Darl Householder.    Final Clinical Impression(s) / ED Diagnoses Final diagnoses:  Dizziness  Near syncope    Rx / DC Orders ED Discharge Orders     None         Jacelyn Pi, MD 05/04/21 2348

## 2021-05-04 NOTE — Assessment & Plan Note (Addendum)
Continue Keppra on discharge. ?

## 2021-05-04 NOTE — ED Triage Notes (Signed)
Pt bib [facility] from church after experiencing dizziness when standing in line. Pt helped to chair, continued to experience weakness and nausea. 2 seizures beginning of year. Pt seen by cardiologist today for irregular rhythm, recommended pacemaker. Rhythm NS with EMS. BP 190/96, HR 70s, O2 97%, CBG 215 ?

## 2021-05-04 NOTE — H&P (Signed)
History and Physical    Patient: Drew Smith:[id number] DOB: September 24, 1940 DOA: 05/04/2021 DOS: the patient was seen and examined on 05/05/2021 PCP: [name], MD  Patient coming from: Home  Chief Complaint:  Chief Complaint  Patient presents with   Weakness        Dizziness   HPI: Drew Smith is a 81 y.o. male with medical history significant of seizure on Keppra, HTN, HLD,  paroxsymal atrial fibrillation not yet on anticoagulation, complete heart block who presents with syncope.   Pt was going to a church today for a meal. Had to go up a hill and up 2 flights of stairs. When he got to the top he felt like he would pass out. Sat down and had sweet tea with food. He felt better for about 15 minutes until he felt dizzy again and decided to call EMS. Denies any palpitations or shortness of breath. He was just seen by cardiology Dr. Lovena Le earlier this morning since he was having unexplained syncope/ seizure episodes.He had Zio patch monitoring which demonstrated a long mostly nocturnal pause with 1 positive complete heart block and paroxysmal atrial fibrillation with heart rate up to 140/min.  PPM insertion was recommended.  He was also recommended to start Eliquis long-term for paroxysmal atrial fibrillation but on hold for now should he decide to undergo PPM insertion in the next 2 weeks.  In the ED, he was in normal sinus rhythm.  CBC and CMP were unremarkable.  Cardiology was consulted and is awaiting recommendations at the time of admission.   Review of Systems: As mentioned in the history of present illness. All other systems reviewed and are negative. Past Medical History:  Diagnosis Date   Arthritis    Asthma    Cancer ([place])    prostate   GERD (gastroesophageal reflux disease)    Guillain-Barre syndrome ([place]) 02/25/1998   Hyperlipidemia    Hypertension    Seizures ([place])    Past Surgical History:  Procedure Laterality Date   CATARACT EXTRACTION      EYE SURGERY  02/26/1943   81 years old   HERNIA REPAIR  02/25/1993   NOSE SURGERY  02/26/1988   PROSTATE SURGERY  02/25/1998   seed inplants   SHOULDER SURGERY  02/25/2006   Social History:  reports that he has never smoked. He has never used smokeless tobacco. He reports current alcohol use of about 7.0 standard drinks per week. He reports that he does not use drugs.  No Known Allergies  Family History  Problem Relation Age of Onset   Alcohol abuse Father    Hyperlipidemia Brother    Stroke Brother    Diabetes Brother        type ll    Prior to Admission medications   Medication Sig Start Date End Date Taking? Authorizing Provider  amLODipine (NORVASC) 5 MG tablet Take 1 tablet (5 mg total) by mouth daily. 07/03/20  Yes Burchette, Alinda Sierras, MD  ciclopirox (LOPROX) 0.77 % cream APPLY TO AFFECTED AREA(S) TWO TIMES A DAY AS DIRECTED Patient taking differently: Apply 1 application. topically 2 (two) times daily. 12/16/19  Yes Burchette, Alinda Sierras, MD  desonide (DESOWEN) 0.05 % cream Apply 1 application. topically 2 (two) times daily.   Yes [provider]  gabapentin (NEURONTIN) 100 MG capsule Take one to two capsules by mouth at night as needed for back pain. 03/07/20  Yes Burchette, Alinda Sierras, MD  hydrochlorothiazide (MICROZIDE) 12.5 MG capsule Take  1 capsule (12.5 mg total) by mouth daily. 07/03/20  Yes Burchette, Alinda Sierras, MD  ibuprofen (ADVIL) 800 MG tablet Take 1 tablet (800 mg total) by mouth every 8 (eight) hours as needed. Patient taking differently: Take 800 mg by mouth every 8 (eight) hours as needed for mild pain. 03/17/20  Yes Burchette, Alinda Sierras, MD  levETIRAcetam (KEPPRA) 500 MG tablet Take 1 tablet (500 mg total) by mouth 2 (two) times daily. 04/18/21  Yes Cameron Sprang, MD  Multiple Vitamins-Minerals (CENTRUM SILVER 50+MEN PO) Take 1 tablet by mouth daily as needed.   Yes [provider]  omeprazole (PRILOSEC) 20 MG capsule TAKE ONE CAPSULE BY MOUTH TWICE A  DAY Patient taking differently: 20 mg daily. 01/23/21  Yes Burchette, Alinda Sierras, MD  VITAMIN D, CHOLECALCIFEROL, PO Take 1 tablet by mouth every other day. 5000 units every other day   Yes [provider]  acyclovir (ZOVIRAX) 400 MG tablet TAKE 1 TABLET (400 MG TOTAL) BY MOUTH AS NEEDED. Patient not taking: Reported on 05/04/2021 01/10/13   [name], MD  azelastine (ASTELIN) 0.1 % nasal spray Place 1 spray into both nostrils 2 (two) times daily. Use in each nostril as directed Patient not taking: Reported on 05/04/2021 03/19/16   [name], MD  levocetirizine (XYZAL) 5 MG tablet TAKE ONE TABLET BY MOUTH EVERY EVENING Patient not taking: Reported on 05/04/2021 12/15/18   [name], MD  Oxymetazoline HCl (NASAL SPRAY) 0.05 % SOLN Place into the nose. Patient not taking: Reported on 05/04/2021    [provider]  traMADol (ULTRAM) 50 MG tablet Take 50 mg by mouth every 6 (six) hours as needed. Patient not taking: Reported on 05/04/2021    [provider]  tretinoin (RETIN-A) 0.05 % cream  10/18/19   [provider]  zinc gluconate 50 MG tablet Take 50 mg by mouth daily as needed. Patient not taking: Reported on 05/04/2021    [provider]    Physical Exam: Vitals:   05/04/21 2009 05/04/21 2115 05/04/21 2145  BP: (!) 158/76 (!) 153/74 (!) 152/76  Pulse: 75 66 66  Resp: (!) '21 15 10  '$ Temp: 98.1 F (36.7 C)    TempSrc: Oral    SpO2: 97% 96% 96%  Weight: 74.8 kg    Height: '5\' 8"'$  (1.727 m)     Constitutional: NAD, calm, comfortable,elderly male sitting upright in bed Eyes: lids and conjunctivae normal ENMT: Mucous membranes are moist.  Neck: normal, supple Respiratory: clear to auscultation bilaterally, no wheezing, no crackles. Normal respiratory effort. No accessory muscle use.  Cardiovascular: Regular rate and rhythm, no murmurs / rubs / gallops. No extremity edema.  Abdomen: no tenderness,  Bowel sounds positive.   Musculoskeletal: no clubbing / cyanosis. No joint deformity upper and lower extremities. Good ROM, no contractures. Normal muscle tone.  Skin: no rashes, lesions, ulcers. No induration Neurologic: CN 2-12 grossly intact. Strength 5/5 in all 4.  Psychiatric: Normal judgment and insight. Alert and oriented x 3. Normal mood. Data Reviewed:  See HPI  Assessment and Plan: * Syncope - Patient had seizure-like activities back in September in his sleep.  He then again had another episode in January.  He was then put on Keppra but was also referred to cardiology.  Had Zio patch monitoring which demonstrated mostly nocturnal pauses and one positive complete heart block as well as paroxysmal atrial fibrillation.  He saw cardiology today and PPM insertion was recommended in the next  2 weeks.  Today he has no signs of infection or metabolic derangements to explain his presyncope. Suspected likely due to his underlying conduction disease.  - Discussed with cardiology fellow Dr. Humphrey Rolls overnight and since he remains in NSR, will have EP follow in the morning  Paroxysmal atrial fibrillation ([place]) Pt has not yet been started on Eliquis since he had discussion of PPM insertion with cardiology today. Will hold on starting pending cardiology recommendation the potential of him requiring PPM sooner.  History of seizures -continue Keppra  Hypertension - Continue amlodipine and HCTZ      Advance Care Planning:   Code Status: Full Code   Consults: cardiology  Family Communication: No family at bedside  Severity of Illness: The appropriate patient status for this patient is OBSERVATION. Observation status is judged to be reasonable and necessary in order to provide the required intensity of service to ensure the patient's safety. The patient's presenting symptoms, physical exam findings, and initial radiographic and laboratory data in the context of their medical condition is felt to place them at decreased  risk for further clinical deterioration. Furthermore, it is anticipated that the patient will be medically stable for discharge from the hospital within 2 midnights of admission.   Author: Orene Desanctis, DO 05/05/2021 1:04 AM  For on call review [email or web address].

## 2021-05-04 NOTE — Assessment & Plan Note (Addendum)
-   Continue amlodipine and HCTZ.  Not on nodal blockers.  Blood pressure seems to be stable at this time. ?

## 2021-05-04 NOTE — Assessment & Plan Note (Addendum)
Patient will be initiated on Eliquis on discharge for anticoagulation. ?

## 2021-05-04 NOTE — Progress Notes (Signed)
? ? ? ? ?HPI ?Drew Smith is referred by Dr. Terri Skains for evaluation of syncope/altered mentation. He is a pleasant 81 yo man with HTN and dyslipidemia. He has had a couple of spells in September and again in January where he lost consciousness. These are characterized as occurring while he is sleeping and associated with convulsive activity for which he actually fell out of the bed with one and had a bloody nose. He has a h/o trauma from playing professional hockey. Workup has demonstrated an ECG with NSR and first degree AV block and a QRS over 100. A 14 day zio monitor which demonstrates long mostly nocturnal pause but one pause with complete heart block around 8:30, and PAF with HR's as high as 140/min. An exercise test demonstrated a HR of 117/min.  ?No Known Allergies ? ? ?Current Outpatient Medications  ?Medication Sig Dispense Refill  ? acyclovir (ZOVIRAX) 400 MG tablet TAKE 1 TABLET (400 MG TOTAL) BY MOUTH AS NEEDED. 90 tablet 2  ? amLODipine (NORVASC) 5 MG tablet Take 1 tablet (5 mg total) by mouth daily. 90 tablet 3  ? ciclopirox (LOPROX) 0.77 % cream APPLY TO AFFECTED AREA(S) TWO TIMES A DAY AS DIRECTED 15 g 0  ? desonide (DESOWEN) 0.05 % cream Apply 1 application topically 2 (two) times daily as needed.    ? hydrochlorothiazide (MICROZIDE) 12.5 MG capsule Take 1 capsule (12.5 mg total) by mouth daily. 90 capsule 3  ? ibuprofen (ADVIL) 800 MG tablet Take 1 tablet (800 mg total) by mouth every 8 (eight) hours as needed. 30 tablet 0  ? levETIRAcetam (KEPPRA) 500 MG tablet Take 1 tablet (500 mg total) by mouth 2 (two) times daily. 180 tablet 3  ? Multiple Vitamins-Minerals (CENTRUM SILVER 50+MEN PO) Take 1 tablet by mouth daily as needed.    ? omeprazole (PRILOSEC) 20 MG capsule TAKE ONE CAPSULE BY MOUTH TWICE A DAY (Patient taking differently: 20 mg daily.) 180 capsule 0  ? Oxymetazoline HCl (NASAL SPRAY) 0.05 % SOLN Place into the nose.    ? tretinoin (RETIN-A) 0.05 % cream     ? VITAMIN D,  CHOLECALCIFEROL, PO Take 1 tablet by mouth every other day. 5000 units every other day    ? azelastine (ASTELIN) 0.1 % nasal spray Place 1 spray into both nostrils 2 (two) times daily. Use in each nostril as directed 30 mL 12  ? gabapentin (NEURONTIN) 100 MG capsule Take one to two capsules by mouth at night as needed for back pain. 60 capsule 3  ? levocetirizine (XYZAL) 5 MG tablet TAKE ONE TABLET BY MOUTH EVERY EVENING 90 tablet 0  ? traMADol (ULTRAM) 50 MG tablet Take 50 mg by mouth every 6 (six) hours as needed.    ? zinc gluconate 50 MG tablet Take 50 mg by mouth daily as needed. (Patient not taking: Reported on 05/04/2021)    ? ?No current facility-administered medications for this visit.  ? ? ? ?Past Medical History:  ?Diagnosis Date  ? Arthritis   ? Asthma   ? Cancer [facility])   ? prostate  ? GERD (gastroesophageal reflux disease)   ? Guillain-Barre syndrome ([place]) 02/25/1998  ? Hyperlipidemia   ? Hypertension   ? Seizures ([place])   ? ? ?ROS: ? ? All systems reviewed and negative except as noted in the HPI. ? ? ?Past Surgical History:  ?Procedure Laterality Date  ? CATARACT EXTRACTION    ? EYE SURGERY  02/26/1943  ? 81 years old  ?  HERNIA REPAIR  02/25/1993  ? NOSE SURGERY  02/26/1988  ? PROSTATE SURGERY  02/25/1998  ? seed inplants  ? SHOULDER SURGERY  02/25/2006  ? ? ? ?Family History  ?Problem Relation Age of Onset  ? Alcohol abuse Father   ? Hyperlipidemia Brother   ? Stroke Brother   ? Diabetes Brother   ?     type ll  ? ? ? ?Social History  ? ?Socioeconomic History  ? Marital status: Divorced  ?  Spouse name: Not on file  ? Number of children: 3  ? Years of education: Not on file  ? Highest education level: Some college, no degree  ?Occupational History  ? Occupation: retired  ?Tobacco Use  ? Smoking status: Never  ? Smokeless tobacco: Never  ?Vaping Use  ? Vaping Use: Never used  ?Substance and Sexual Activity  ? Alcohol use: Yes  ?  Alcohol/week: 7.0 standard drinks  ?  Types: 7 Glasses of wine per week  ?   Comment: have 1/2 a day; wine or other   ? Drug use: No  ? Sexual activity: Not Currently  ?Other Topics Concern  ? Not on file  ?Social History Narrative  ? Lives alone in one level. Has two sons 1 daughter and friends who serve as support.  ? Former Chiropodist; Enjoys Data processing manager, reading  ? Originally from [place]  ? ?Social Determinants of Health  ? ?Financial Resource Strain: Low Risk   ? Difficulty of Paying Living Expenses: Not hard at all  ?Food Insecurity: No Food Insecurity  ? Worried About Charity fundraiser in the Last Year: Never true  ? Ran Out of Food in the Last Year: Never true  ?Transportation Needs: Not on file  ?Physical Activity: Sufficiently Active  ? Days of Exercise per Week: 5 days  ? Minutes of Exercise per Session: 60 min  ?Stress: No Stress Concern Present  ? Feeling of Stress : Not at all  ?Social Connections: Moderately Integrated  ? Frequency of Communication with Friends and Family: More than three times a week  ? Frequency of Social Gatherings with Friends and Family: More than three times a week  ? Attends Religious Services: 1 to 4 times per year  ? Active Member of Clubs or Organizations: Yes  ? Attends Archivist Meetings: More than 4 times per year  ? Marital Status: Divorced  ?Intimate Partner Violence: Not At Risk  ? Fear of Current or Ex-Partner: No  ? Emotionally Abused: No  ? Physically Abused: No  ? Sexually Abused: No  ? ? ? ?BP (!) 143/78   Pulse 76   Ht '5\' 9"'$  (1.753 m)   Wt 165 lb (74.8 kg)   BMI 24.37 kg/m?  ? ?Physical Exam: ? ?Well appearing NAD ?HEENT: Unremarkable ?Neck:  No JVD, no thyromegally ?Lymphatics:  No adenopathy ?Back:  No CVA tenderness ?Lungs:  Clear with no wheezes ?HEART:  Regular rate rhythm, no murmurs, no rubs, no clicks ?Abd:  soft, positive bowel sounds, no organomegally, no rebound, no guarding ?Ext:  2 plus pulses, no edema, no cyanosis, no clubbing ?Skin:  No rashes no nodules ?Neuro:  CN II through XII intact, motor grossly  intact ? ? ?Assess/Plan:  ?Unexplained syncope - I suspect that these are seizures and not related to bradycardia. Rather from his old hockey concussions and so far he has had none on Keppra. ?Heart block - mostly this is nocturnal but he does have conduction  system disease and an episode of transient CHB in the morning around 8:30. I have recommended PPM insertion and he will call us if he wishes to proceed. As an alternative and to better correlate his symptoms, I have offered him an ILR. ?PAF - he was out of rhythm 3% of the time. I have recommended Eliquis long term. If he chooses to undergo PPM insertion in the next 2 weeks, then he could hold the eliquis until the PPM is inserted. Otherwise he needs to start eliquis. ? ?Carleene Overlie Klaira Pesci,MD ?

## 2021-05-04 NOTE — Patient Instructions (Signed)
Medication Instructions:  Your physician recommends that you continue on your current medications as directed. Please refer to the Current Medication list given to you today.     * If you need a refill on your cardiac medications before your next appointment, please call your pharmacy. *   Labwork: Pre procedure lab work _____________: BMET & CBC  * Will notify you of abnormal results, otherwise continue current treatment plan.*   Testing/Procedures: Your physician has recommended that you have a pacemaker inserted. A pacemaker is a small device that is placed under the skin of your chest or abdomen to help control abnormal heart rhythms. This device uses electrical pulses to prompt the heart to beat at a normal rate. Pacemakers are used to treat heart rhythms that are too slow. Wire (leads) are attached to the pacemaker that goes into the chambers of you heart. This is done in the hospital and usually requires and overnight stay.   Please call the office and let us know if you would like to proceed with this procedure.  The following are available dates (these are subject to change):  3/20, 3/22, 3/23, 4/5, 4/19, 4/11, 4/18, 4/24   Follow-Up: Your physician recommends that you schedule a wound check appointment 10-14 days, after your procedure on _____________, with the device clinic.  Your physician recommends that you schedule a follow up appointment in 91 days, after your procedure on _____________, with Dr. Lovena Le.   Thank you for choosing [facility]!!   [phone]) [id number]   Other instructions   Implantable Device Instructions  You are scheduled for:                  _____ Permanent Transvenous Pacemaker  on  ____________  with Dr. Lovena Le.  1.   On the day of your procedure ____________ you will go to [facility] hospital [phone]) at _____________.  You will go to the main entrance [address] [employer]) and enter where the [employer] parking staff are.  You will check in  at ADMITTING.  You may have one support person come in to the hospital with you.  They will be asked to wait in the waiting room.  Do not eat or drink after midnight the night before your procedure.  3.   Complete pre procedure  lab work on _____________.  The lab at [address] is open from 7:30 AM to 4:30 PM.  You do not have to be fasting.  4.   Medication instructions:  _______________________  5.  Plan for an overnight stay, but you may be discharged home after your procedure. If you use your phone frequently bring your phone charger, in case you have to stay.  If you are discharged after your procedure you will need someone to drive you home and be with your for 24 hours after your procedure. Bring your insurance cards and a list of you medications.  6.  Wash your chest and neck with surgical scrub the evening before and the morning of your procedure.  Rinse well. Please review the surgical scrub instruction sheet given to you.  7. FYI: For your safety, and to allow [place] to monitor your vital signs accurately during the surgery/procedure we request that if you have artificial nails, gel coating, SNS etc. Please have those removed prior to your surgery/procedure. Not having the nail coverings /polish removed may result in cancellation or delay of your surgery/procedure.  8. You will follow up with the [facility] 10-14 days after your procedure. You will follow up with Dr. Lovena Le 91 days after your procedure.  These appointments will be made for you.                                                    * If you have ANY questions after you get home, please call Sonia Baller, RN @ [phone].  * Every attempt is made to prevent procedures from being rescheduled.  Due to the nature of  Electrophysiology, rescheduling can happen.  The physician is always aware and directs the staff when this occurs.     [place] -  Preparing For Surgery (surgical scrub)  Before surgery, you can play an important role. Because skin is not sterile, your skin needs to be as free of germs as possible. You can reduce the number of germs on your skin by washing with CHG (chlorahexidine gluconate) Soap before surgery.  CHG is an antiseptic cleaner which kills germs and bonds with the skin to continue killing germs even after washing.   Please do not use if you have an allergy to CHG or antibacterial soaps.  If your skin becomes reddened/irritated stop using the CHG.   Do not shave (including legs and underarms) for at least 48 hours prior to first CHG shower.  It is OK to shave your face.  Please follow these instructions carefully:  1.  Shower the night before surgery and the morning of surgery with CHG.  2.  If you choose to wash your hair, wash your hair first as usual with your normal shampoo.  3.  After you shampoo, rinse your hair and body thoroughly to remove the shampoo.  4.  Use CHG as you would any other liquid soap.  You can apply CHG directly to the skin and wash gently with a clean washcloth. 5.  Apply the CHG Soap to your body ONLY FROM THE NECK DOWN.  Do not use on open wounds or open sores.  Avoid contact with your eyes, ears, mouth and genitals (private parts).  Wash genitals (private parts) with your normal soap.  6.  Wash thoroughly, paying special attention to the area where your surgery will be performed.  7.  Thoroughly rinse your body with warm water from the neck down.   8.  DO NOT shower/wash with your normal soap after using and rinsing off the CHG soap.  9.  Pat yourself dry with a clean towel.           10.  Wear clean pajamas.           11.  Place clean sheets on your bed the night of your first shower and do not sleep with pets.  Day of Surgery: Do not apply any deodorants/lotions.  Please wear clean clothes to the hospital/surgery center.     Pacemaker Implantation, Adult Pacemaker  implantation is a procedure to place a pacemaker inside the chest. A pacemaker is a small computer that sends electrical signals to the heart and helps the heart beat normally. A pacemaker also stores information about heart rhythms. You may need pacemaker implantation if you have: A slow heartbeat (bradycardia). Loss of consciousness that happens repeatedly (syncope) or repeated episodes of dizziness or light-headedness because of an  irregular heart rate. Shortness of breath (dyspnea) due to heart problems. The pacemaker usually attaches to your heart through a wire called a lead. One or two leads may be needed. There are different types of pacemakers: Transvenous pacemaker. This type is placed under the skin or muscle of your upper chest area. The lead goes through a vein in the chest area to reach the inside of the heart. Epicardial pacemaker. This type is placed under the skin or muscle of your chest or abdomen. The lead goes through your chest to the outside of the heart. Tell a health care provider about: Any allergies you have. All medicines you are taking, including vitamins, herbs, eye drops, creams, and over-the-counter medicines. Any problems you or family members have had with anesthetic medicines. Any blood or bone disorders you have. Any surgeries you have had. Any medical conditions you have. Whether you are pregnant or may be pregnant. What are the risks? Generally, this is a safe procedure. However, problems may occur, including: Infection. Bleeding. Failure of the pacemaker or the lead. Collapse of a lung or bleeding into a lung. Blood clot inside a blood vessel with a lead. Damage to the heart. Infection inside the heart (endocarditis). Allergic reactions to medicines. What happens before the procedure? Staying hydrated Follow instructions from your health care provider about hydration, which may include: Up to 2 hours before the procedure - you may continue to drink  clear liquids, such as water, clear fruit juice, black coffee, and plain tea.  Eating and drinking restrictions Follow instructions from your health care provider about eating and drinking, which may include: 8 hours before the procedure - stop eating heavy meals or foods, such as meat, fried foods, or fatty foods. 6 hours before the procedure - stop eating light meals or foods, such as toast or cereal. 6 hours before the procedure - stop drinking milk or drinks that contain milk. 2 hours before the procedure - stop drinking clear liquids. Medicines Ask your health care provider about: Changing or stopping your regular medicines. This is especially important if you are taking diabetes medicines or blood thinners. Taking medicines such as aspirin and ibuprofen. These medicines can thin your blood. Do not take these medicines unless your health care provider tells you to take them. Taking over-the-counter medicines, vitamins, herbs, and supplements. Tests You may have: A heart evaluation. This may include: An electrocardiogram (ECG). This involves placing patches on your skin to check your heart rhythm. A chest X-ray. An echocardiogram. This is a test that uses sound waves (ultrasound) to produce an image of the heart. A cardiac rhythm monitor. This is used to record your heart rhythm and any events for a longer period of time. Blood tests. Genetic testing. General instructions Do not use any products that contain nicotine or tobacco for at least 4 weeks before the procedure. These products include cigarettes, e-cigarettes, and chewing tobacco. If you need help quitting, ask your health care provider. Ask your health care provider: How your surgery site will be marked. What steps will be taken to help prevent infection. These steps may include: Removing hair at the surgery site. Washing skin with a germ-killing soap. Receiving antibiotic medicine. Plan to have someone take you home from  the hospital or clinic. If you will be going home right after the procedure, plan to have someone with you for 24 hours. What happens during the procedure? An IV will be inserted into one of your veins. You will be given  one or more of the following: A medicine to help you relax (sedative). A medicine to numb the area (local anesthetic). A medicine to make you fall asleep (general anesthetic). The next steps vary depending on the type of pacemaker you will be getting. If you are getting a transvenous pacemaker: An incision will be made in your upper chest. A pocket will be made for the pacemaker. It may be placed under the skin or between layers of muscle. The lead will be inserted into a blood vessel that goes to the heart. While X-rays are taken by an imaging machine (fluoroscopy), the lead will be advanced through the vein to the inside of your heart. The other end of the lead will be tunneled under the skin and attached to the pacemaker. If you are getting an epicardial pacemaker: An incision will be made near your ribs or breastbone (sternum) for the lead. The lead will be attached to the outside of your heart. Another incision will be made in your chest or upper abdomen to create a pocket for the pacemaker. The free end of the lead will be tunneled under the skin and attached to the pacemaker. The transvenous or epicardial pacemaker will be tested. Imaging studies may be done to check the lead position. The incisions will be closed with stitches (sutures), adhesive strips, or skin glue. Bandages (dressings) will be placed over the incisions. The procedure may vary among health care providers and hospitals. What happens after the procedure? Your blood pressure, heart rate, breathing rate, and blood oxygen level will be monitored until you leave the hospital or clinic. You may be given antibiotics. You will be given pain medicine. An ECG and chest X-rays will be done. You may need to  wear a continuous type of ECG (Holter monitor) to check your heart rhythm. Your health care provider will program the pacemaker. If you were given a sedative during the procedure, it can affect you for several hours. Do not drive or operate machinery until your health care provider says that it is safe. You will be given a pacemaker identification card. This card lists the implant date, device model, and manufacturer of your pacemaker. Summary A pacemaker is a small computer that sends electrical signals to the heart and helps the heart beat normally. There are different types of pacemakers. A pacemaker may be placed under the skin or muscle of your chest or abdomen. Follow instructions from your health care provider about eating and drinking and about taking medicines before the procedure. This information is not intended to replace advice given to you by your health care provider. Make sure you discuss any questions you have with your health care provider. Document Revised: 10/24/2020 Document Reviewed: 01/13/2019 Elsevier Patient Education  2022 [employer].

## 2021-05-04 NOTE — Assessment & Plan Note (Addendum)
History of seizure-like episode on Keppra.  He was followed by cardiology as outpatient and had a Zio patch which demonstrated nocturnal pauses and one complete heart block with paroxysmal atrial fibrillation.  Status post cardiac MRI today.  Cardiology has seen the patient and at this time patient will be followed up as outpatient.  No plans for pacemaker placement ?

## 2021-05-05 DIAGNOSIS — I442 Atrioventricular block, complete: Secondary | ICD-10-CM | POA: Diagnosis present

## 2021-05-05 DIAGNOSIS — G610 Guillain-Barre syndrome: Secondary | ICD-10-CM | POA: Diagnosis present

## 2021-05-05 DIAGNOSIS — R9431 Abnormal electrocardiogram [ECG] [EKG]: Secondary | ICD-10-CM

## 2021-05-05 DIAGNOSIS — Z823 Family history of stroke: Secondary | ICD-10-CM | POA: Diagnosis not present

## 2021-05-05 DIAGNOSIS — J45909 Unspecified asthma, uncomplicated: Secondary | ICD-10-CM | POA: Diagnosis present

## 2021-05-05 DIAGNOSIS — K219 Gastro-esophageal reflux disease without esophagitis: Secondary | ICD-10-CM | POA: Diagnosis present

## 2021-05-05 DIAGNOSIS — I10 Essential (primary) hypertension: Secondary | ICD-10-CM | POA: Diagnosis present

## 2021-05-05 DIAGNOSIS — Z79899 Other long term (current) drug therapy: Secondary | ICD-10-CM | POA: Diagnosis not present

## 2021-05-05 DIAGNOSIS — E785 Hyperlipidemia, unspecified: Secondary | ICD-10-CM | POA: Diagnosis present

## 2021-05-05 DIAGNOSIS — E876 Hypokalemia: Secondary | ICD-10-CM | POA: Diagnosis present

## 2021-05-05 DIAGNOSIS — Z7901 Long term (current) use of anticoagulants: Secondary | ICD-10-CM | POA: Diagnosis not present

## 2021-05-05 DIAGNOSIS — I422 Other hypertrophic cardiomyopathy: Secondary | ICD-10-CM | POA: Diagnosis present

## 2021-05-05 DIAGNOSIS — G40909 Epilepsy, unspecified, not intractable, without status epilepticus: Secondary | ICD-10-CM | POA: Diagnosis present

## 2021-05-05 DIAGNOSIS — Z8546 Personal history of malignant neoplasm of prostate: Secondary | ICD-10-CM | POA: Diagnosis not present

## 2021-05-05 DIAGNOSIS — Z833 Family history of diabetes mellitus: Secondary | ICD-10-CM | POA: Diagnosis not present

## 2021-05-05 DIAGNOSIS — I480 Paroxysmal atrial fibrillation: Secondary | ICD-10-CM | POA: Diagnosis present

## 2021-05-05 DIAGNOSIS — Z87898 Personal history of other specified conditions: Secondary | ICD-10-CM | POA: Diagnosis not present

## 2021-05-05 DIAGNOSIS — G522 Disorders of vagus nerve: Secondary | ICD-10-CM | POA: Diagnosis present

## 2021-05-05 DIAGNOSIS — Z20822 Contact with and (suspected) exposure to covid-19: Secondary | ICD-10-CM | POA: Diagnosis present

## 2021-05-05 DIAGNOSIS — R42 Dizziness and giddiness: Secondary | ICD-10-CM | POA: Diagnosis present

## 2021-05-05 DIAGNOSIS — R55 Syncope and collapse: Secondary | ICD-10-CM | POA: Diagnosis present

## 2021-05-05 LAB — BASIC METABOLIC PANEL
Anion gap: 8 (ref 5–15)
BUN: 17 mg/dL (ref 8–23)
CO2: 27 mmol/L (ref 22–32)
Calcium: 9.2 mg/dL (ref 8.9–10.3)
Chloride: 103 mmol/L (ref 98–111)
Creatinine, Ser: 1 mg/dL (ref 0.61–1.24)
GFR, Estimated: >60 mL/min (ref >60)
Glucose, Bld: 93 mg/dL (ref 70–99)
Potassium: 4.3 mmol/L (ref 3.5–5.1)
Sodium: 138 mmol/L (ref 135–145)

## 2021-05-05 LAB — RESP PANEL BY RT-PCR (FLU A&B, COVID) ARPGX2
Influenza A by PCR: NEGATIVE
Influenza B by PCR: NEGATIVE
SARS Coronavirus 2 by RT PCR: NEGATIVE

## 2021-05-05 MED ORDER — HYDROCORTISONE 1 % EX CREA
TOPICAL_CREAM | Freq: Four times a day (QID) | CUTANEOUS | Status: DC | PRN
  Filled 2021-05-05: qty 28

## 2021-05-05 NOTE — Hospital Course (Addendum)
Drew Smith is a 81 y.o. male with past medical history of seizure, hypertension, hyperlipidemia, paroxysmal atrial fibrillation not on anticoagulation and history of complete heart block presented to hospital with syncope while going to his church.  He also had some dizziness but no palpitation or shortness of breath.  Patient was previously seen by Dr. Lovena Le for unexplained seizure/syncope and had Zio patch monitoring which demonstrated nocturnal phase complete heart block in the past with atrial fibrillation.  Permanent pacemaker insertion was recommended including Eliquis for anticoagulation but since plan was to insert pacemaker he was not started on Eliquis yet.  In the ED, patient had normal sinus rhythm.  Cardiology was consulted and patient has undergone cardiac MRI.  Electrophysiology cardiology to evaluate the patient. ?

## 2021-05-05 NOTE — ED Notes (Signed)
The pt keeps asking when is he going home ?

## 2021-05-05 NOTE — Progress Notes (Signed)
duplicate

## 2021-05-05 NOTE — Assessment & Plan Note (Addendum)
History of CHB.   ?Cardiology followed the patient during hospitalization.   Patient underwent cardiac MRI for evaluation of possible hypertrophic cardiomyopathy and  there was some asymmetric thickening of the septum.  Case was discussed with Dr. Lovena Le who recommended no need for pacemaker at this time and plan for Eliquis for paroxysmal atrial fibrillation.  Cardiology to follow the patient as outpatient. ?

## 2021-05-05 NOTE — Consult Note (Signed)
ELECTROPHYSIOLOGY CONSULT NOTE  Patient ID: Drew Smith, MRN: [id number], DOB/AGE: 82-Dec-1942 82 y.o. Admit date: 05/04/2021 Date of Consult: 05/05/2021  Primary Physician: Drew Post, MD Primary Cardiologist: Drew Smith (PCV) Drew Smith is a 81 y.o. male who is being seen today for the evaluation of lightheadedness at the request of Dr Drew Smith.   Chief Complaint: lightheadedness   HPI Drew Smith is a 81 y.o. male admitted following an episode of prolonged lightheadedness.  He had gone to a fish fry, had walked across the parking lot had walked up 2 flights of stairs and began to feel woozy and lightheaded.  This persisted with gradual worsening over the next 15-20 minutes.  He thought he was hypoglycemic.  He sat down and when he got to cash table, he was given some sweet tea with mild improvement and then recurrent symptoms.  On arrival of EMS his blood pressure was 190.  While they were waiting for EMS, a pediatrician who was at the dinner put up his apple watch on the patient, and noted that the rhythm was normal.  He was throughout this prolonged.  Mildly diaphoretic.  He was noted after he sat down that his color improved.  Patient seen yesterday by Dr. Elliot Smith in the office with concerns of nocturnal convulsive activity resulting in falling out of bed and monitor demonstrating complete heart block at nighttime and ? also during the day.  Pacemaker was recommended and the patient decided to ponder this.  A loop recorder was recommended as an alternative.   Review of the tracings suggests that this is hyper vagotonia, see below.  The events associated with loss of consciousness are presumed seizure, they were both associated with a prolonged postictal confusion state  Mild dyspnea on exertion.  Occasional peripheral edema.  Denies chest pain.  Evaluation as an outpatient had included a standard stress test 2/23 hypertensive response and an echocardiogram 1/23 that  demonstrated normal LV function and asymmetric septal hypertrophy with a septal 1.7 cm and a posterior wall of 1.2 cm  There is no family history of sudden cardiac death or hypertrophic cardiomyopathy.  He also has atrial fibrillation for which  he is on Eliquis.    Past Medical History:  Diagnosis Date   Arthritis    Asthma    Cancer ([place])    prostate   GERD (gastroesophageal reflux disease)    Guillain-Barre syndrome ([place]) 02/25/1998   Hyperlipidemia    Hypertension    Seizures ([place])               Surgical History:  Past Surgical History:  Procedure Laterality Date   CATARACT EXTRACTION     EYE SURGERY  02/26/1943   81 years old   HERNIA REPAIR  02/25/1993   NOSE SURGERY  02/26/1988   PROSTATE SURGERY  02/25/1998   seed inplants   SHOULDER SURGERY  02/25/2006     Home Meds: Prior to Admission medications   Medication Sig Start Date End Date Taking? Authorizing Provider  amLODipine (NORVASC) 5 MG tablet Take 1 tablet (5 mg total) by mouth daily. 07/03/20  Yes Burchette, Alinda Sierras, MD  ciclopirox (LOPROX) 0.77 % cream APPLY TO AFFECTED AREA(S) TWO TIMES A DAY AS DIRECTED Patient taking differently: Apply 1 application. topically 2 (two) times daily. 12/16/19  Yes Burchette, Alinda Sierras, MD  desonide (DESOWEN) 0.05 % cream Apply 1 application. topically 2 (two) times daily.   Yes [provider]  gabapentin (NEURONTIN)  100 MG capsule Take one to two capsules by mouth at night as needed for back pain. 03/07/20  Yes Burchette, Alinda Sierras, MD  hydrochlorothiazide (MICROZIDE) 12.5 MG capsule Take 1 capsule (12.5 mg total) by mouth daily. 07/03/20  Yes Burchette, Alinda Sierras, MD  ibuprofen (ADVIL) 800 MG tablet Take 1 tablet (800 mg total) by mouth every 8 (eight) hours as needed. Patient taking differently: Take 800 mg by mouth every 8 (eight) hours as needed for mild pain. 03/17/20  Yes Burchette, Alinda Sierras, MD  levETIRAcetam (KEPPRA) 500 MG tablet Take 1 tablet (500 mg total) by  mouth 2 (two) times daily. 04/18/21  Yes Cameron Sprang, MD  Multiple Vitamins-Minerals (CENTRUM SILVER 50+MEN PO) Take 1 tablet by mouth daily as needed.   Yes [provider]  omeprazole (PRILOSEC) 20 MG capsule TAKE ONE CAPSULE BY MOUTH TWICE A DAY Patient taking differently: 20 mg daily. 01/23/21  Yes Burchette, Alinda Sierras, MD  VITAMIN D, CHOLECALCIFEROL, PO Take 1 tablet by mouth every other day. 5000 units every other day   Yes [provider]  acyclovir (ZOVIRAX) 400 MG tablet TAKE 1 TABLET (400 MG TOTAL) BY MOUTH AS NEEDED. Patient not taking: Reported on 05/04/2021 01/10/13   Drew Post, MD  azelastine (ASTELIN) 0.1 % nasal spray Place 1 spray into both nostrils 2 (two) times daily. Use in each nostril as directed Patient not taking: Reported on 05/04/2021 03/19/16   Drew Post, MD  levocetirizine (XYZAL) 5 MG tablet TAKE ONE TABLET BY MOUTH EVERY EVENING Patient not taking: Reported on 05/04/2021 12/15/18   Drew Post, MD  Oxymetazoline HCl (NASAL SPRAY) 0.05 % SOLN Place into the nose. Patient not taking: Reported on 05/04/2021    [provider]  traMADol (ULTRAM) 50 MG tablet Take 50 mg by mouth every 6 (six) hours as needed. Patient not taking: Reported on 05/04/2021    [provider]  tretinoin (RETIN-A) 0.05 % cream  10/18/19   [provider]  zinc gluconate 50 MG tablet Take 50 mg by mouth daily as needed. Patient not taking: Reported on 05/04/2021    [provider]    Inpatient Medications:   amLODipine  5 mg Oral Daily   enoxaparin (LOVENOX) injection  40 mg Subcutaneous Q24H   hydrochlorothiazide  12.5 mg Oral Daily   levETIRAcetam  500 mg Oral BID   pantoprazole  40 mg Oral Daily      Allergies: No Known Allergies  Social History   Socioeconomic History   Marital status: Divorced    Spouse name: Not on file   Number of children: 3   Years of education: Not on file   Highest education level:  Some college, no degree  Occupational History   Occupation: retired  Tobacco Use   Smoking status: Never   Smokeless tobacco: Never  Vaping Use   Vaping Use: Never used  Substance and Sexual Activity   Alcohol use: Yes    Alcohol/week: 7.0 standard drinks    Types: 7 Glasses of wine per week    Comment: have 1/2 a day; wine or other    Drug use: No   Sexual activity: Not Currently  Other Topics Concern   Not on file  Social History Narrative   Lives alone in one level. Has two sons 1 daughter and friends who serve as support.   Former Chiropodist; Enjoys Data processing manager, reading   Originally from [place]   Social Determinants of  Health   Financial Resource Strain: Low Risk    Difficulty of Paying Living Expenses: Not hard at all  Food Insecurity: No Food Insecurity   Worried About Running Out of Food in the Last Year: Never true   Ran Out of Food in the Last Year: Never true  Transportation Needs: Not on file  Physical Activity: Sufficiently Active   Days of Exercise per Week: 5 days   Minutes of Exercise per Session: 60 min  Stress: No Stress Concern Present   Feeling of Stress : Not at all  Social Connections: Moderately Integrated   Frequency of Communication with Friends and Family: More than three times a week   Frequency of Social Gatherings with Friends and Family: More than three times a week   Attends Religious Services: 1 to 4 times per year   Active Member of [employer] or Organizations: Yes   Attends Music therapist: More than 4 times per year   Marital Status: Divorced  Human resources officer Violence: Not At Risk   Fear of Current or Ex-Partner: No   Emotionally Abused: No   Physically Abused: No   Sexually Abused: No     Family History  Problem Relation Age of Onset   Alcohol abuse Father    Hyperlipidemia Brother    Stroke Brother    Diabetes Brother        type ll     ROS:  Please see the history of present illness.     All other systems  reviewed and negative.    Physical Exam:  Blood pressure (!) 154/84, pulse 63, temperature 97.7 F (36.5 C), temperature source Oral, resp. rate 20, height '5\' 8"'$  (1.727 m), weight 74.8 kg, SpO2 98 %. General: Well developed, well nourished male in no acute distress. Head: Normocephalic, atraumatic, sclera non-icteric, no xanthomas, nares are without discharge. EENT: normal Lymph Nodes:  none Back: without scoliosis/kyphosis, no CVA tendersness Neck: Negative for carotid bruits. JVD 8-10 cm. Lungs: Bibasilar crackles Heart: RRR with S1 S2. No  murmur , rubs, or gallops appreciated. Abdomen: Soft, non-tender, non-distended with normoactive bowel sounds. No hepatomegaly. No rebound/guarding. No obvious abdominal masses. Msk:  Strength and tone appear normal for age. Extremities: No clubbing or cyanosis. tr edema.  Distal pedal pulses are 2+ and equal bilaterally. Skin: Warm and Dry Neuro: Alert and oriented X 3. CN III-XII intact Grossly normal sensory and motor function . Psych:  Responds to questions appropriately with a normal affect.      Labs: Cardiac Enzymes No results for input(s): CKTOTAL, CKMB, TROPONINI in the last 72 hours. CBC Lab Results  Component Value Date   WBC 6.8 05/04/2021   HGB 14.3 05/04/2021   HCT 40.4 05/04/2021   MCV 91.6 05/04/2021   PLT 240 05/04/2021   PROTIME: No results for input(s): LABPROT, INR in the last 72 hours. Chemistry  Recent Labs  Lab 05/04/21 2018 05/05/21 0239  NA 134* 138  K 3.8 4.3  CL 100 103  CO2 24 27  BUN 19 17  CREATININE 1.13 1.00  CALCIUM 8.8* 9.2  PROT 6.6  --   BILITOT 0.5  --   ALKPHOS 57  --   ALT 14  --   AST 19  --   GLUCOSE 196* 93   Lipids Lab Results  Component Value Date   CHOL 163 04/05/2019   HDL 47.60 04/05/2019   LDLCALC 95 04/05/2019   TRIG 103.0 04/05/2019   BNP No results found  for: PROBNP Thyroid Function Tests: No results for input(s): TSH, T4TOTAL, T3FREE, THYROIDAB in the last 72  hours.  Invalid input(s): FREET3     Zio patch was personally reviewed.  On both episodes of complete heart block, there was PP prolongation associated with AV block.  The other episode of heart block occurred in the context of atrial fibrillation    EKG: Sinus rhythm at 78 Intervals 18/11/44 Q waves 1, L, V4-V6 consistent with lateral MI and concerning for hypertrophic cardiomyopathy   Assessment and Plan:  Protracted lightheadedness not consistent with heart block  Episodic complete heart block associated with PP prolongation consistent with hyper vagotonia   Lateral Q waves concerning for prior MI versus hypertrophic cardiomyopathy  Asymmetric septal hypertrophy  Hypertension with excessive blood pressure response to exercise   The patient was seen because of an abnormal monitor which is I think unrelated to the event yesterday which had prompted the patient's admission anticipating relief by pacing.  I do not think that pacing will provide relief for the episodes yesterday.  Moreover, as he has had no prior syncope, it is also unlikely that his 830 episode of complete heart block occurred while awake and he notes that sometimes he sleeps till late hour.  Given the concomitant PP prolongation I suspect his hyper vagotonia from sleep disordered breathing.  I will defer to Dr. Elliot Smith whether he needs pacing going forward.  The deep Q waves are also concerning and while his echo showed no wall motion abnormality it did shows asymmetric septal hypertrophy raising the possibility that hypertrophic cardiomyopathy is the explanation.  While it is not an issue in this older gentleman, given his genetic associations it would be important for his Kind[facility].  I discussed this with Dr. Virgina Jock and we will anticipate cMRI prior to proceeding with device implantation as that is Dr. Heath Gold decision.  EP will see again Monday And I will leave him n.p.o. for Monday pending Dr. Heath Gold  decision    Virl Axe

## 2021-05-05 NOTE — Progress Notes (Addendum)
?PROGRESS NOTE ? ? ? ?AMIT LEECE  YHC:[id number] DOB: April 20, 1940 DOA: 05/04/2021 ?PCP: [name], MD  ? ? ?Brief Narrative:  ?Drew Smith is a 81 y.o. male with past medical history of seizure, hypertension, hyperlipidemia, paroxysmal atrial fibrillation not on anticoagulation and history of complete heart block presented to hospital with syncope while going to his church.  He also had some dizziness but no palpitation or shortness of breath.  Patient was previously seen by Dr. Lovena Le for unexplained seizure/syncope and had Zio patch monitoring which demonstrated nocturnal phase complete heart block in the past with atrial fibrillation.  Permanent pacemaker insertion was recommended including Eliquis for anticoagulation but since plan was to insert pacemaker he was not started on Eliquis yet.  In the ED,patient had normal sinus rhythm.  Cardiology has been consulted at this time for further evaluation.  ? ?  ?Assessment and Plan: ?* Syncope ?History of seizure-like episode on Keppra.  He was followed by cardiology as outpatient and had a Zio patch which demonstrated nocturnal pauses and one complete heart block with paroxysmal atrial fibrillation.  Patient was seen by cardiology and was recommended pacemaker insertion.  Communicated with cardiology who recommended further assessment in the hospital and evaluation on Monday.  We will keep n.p.o. after Sunday night. ? ?CHB (complete heart block) ([place]) ?History of CHB.   ?Seen by cardiology today.  Plan for further assessment on Monday.  We will keep n.p.o. after Sunday midnight.  Might need pacemaker placement this admission.  Cardiology also planning for cardiac MRI for possible hypertrophic cardiomyopathy ? ?Paroxysmal atrial fibrillation (HCC) ?Pt has not yet been started on Eliquis since he had discussion of PPM insertion with cardiology.  Currently normal sinus rhythm. ? ?History of seizures ?Keppra.  No active issues. ? ?Hypertension ?-  Continue amlodipine and HCTZ.  Not on nodal blockers. ? ? ? DVT prophylaxis: enoxaparin (LOVENOX) injection 40 mg Start: 05/04/21 2300 ? ? ?Code Status:   ?  Code Status: Full Code ? ?Disposition: Home ? ?Status is: Observation ? ?The patient will require care spanning > 2 midnights and should be moved to inpatient because: Need for possible pacemaker placement, ? ? Family Communication:  ?Spoke with the patient at bedside.  Unable to reach the patient's son on the phone. ? ?Consultants:  ?Cardiology ? ?Procedures:  ?None ? ?Antimicrobials:  ?None ? ?Anti-infectives (From admission, onward)  ? ? None  ? ?  ? ?Subjective: ?Today, patient was seen and examined at bedside.  Denies any chest pain, shortness of breath, fever, chills or rigor.  Frustrated about being in the hospital ? ?Objective: ?Vitals:  ? 05/05/21 0730 05/05/21 0932 05/05/21 1000 05/05/21 1218  ?BP: (!) 143/79 (!) 169/85 (!) 167/85 (!) 154/84  ?Pulse: 60 64 63   ?Resp: 18 (!) 25 20   ?Temp: 97.7 ?F (36.5 ?C)   97.7 ?F (36.5 ?C)  ?TempSrc: Oral   Oral  ?SpO2: 97% 97% 98%   ?Weight:      ?Height:      ? ? ?Intake/Output Summary (Last 24 hours) at 05/05/2021 1543 ?Last data filed at 05/05/2021 0116 ?Gross per 24 hour  ?Intake 1000 ml  ?Output 800 ml  ?Net 200 ml  ? ?Filed Weights  ? 05/04/21 2009  ?Weight: 74.8 kg  ? ? ?Physical Examination: ? ?General:  Average built, not in obvious distress, elderly male, ?HENT:   No scleral pallor or icterus noted. Oral mucosa is moist.  ?Chest:  Clear breath  sounds.  Diminished breath sounds bilaterally. No crackles or wheezes.  ?CVS: S1 &S2 heard. No murmur.  Regular rate and rhythm. ?Abdomen: Soft, nontender, nondistended.  Bowel sounds are heard.   ?Extremities: No cyanosis, clubbing or edema.  Peripheral pulses are palpable. ?Psych: Alert, awake and oriented, normal mood ?CNS:  No cranial nerve deficits.  Power equal in all extremities.   ?Skin: Warm and dry.  No rashes noted. ? ?Data Reviewed:  ? ?CBC: ?Recent Labs   ?Lab 05/04/21 ?2018  ?WBC 6.8  ?NEUTROABS 4.8  ?HGB 14.3  ?HCT 40.4  ?MCV 91.6  ?PLT 240  ? ? ?Basic Metabolic Panel: ?Recent Labs  ?Lab 05/04/21 ?2018 05/05/21 ?0239  ?NA 134* 138  ?K 3.8 4.3  ?CL 100 103  ?CO2 24 27  ?GLUCOSE 196* 93  ?BUN 19 17  ?CREATININE 1.13 1.00  ?CALCIUM 8.8* 9.2  ?MG 1.8  --   ? ? ?Liver Function Tests: ?Recent Labs  ?Lab 05/04/21 ?2018  ?AST 19  ?ALT 14  ?ALKPHOS 57  ?BILITOT 0.5  ?PROT 6.6  ?ALBUMIN 3.6  ? ? ? ?Radiology Studies: ?DG Chest Portable 1 View ? ?Result Date: 05/04/2021 ?CLINICAL DATA:  Presyncope.  Weakness and dizziness. EXAM: PORTABLE CHEST 1 VIEW COMPARISON:  Chest radiograph dated 03/03/2021. FINDINGS: Elevation of the left hemidiaphragm similar to prior radiograph. No new consolidation. No pleural effusion pneumothorax. Stable cardiomediastinal silhouette. Atherosclerotic calcification of the aortic arch. No acute osseous pathology. IMPRESSION: No active disease. Electronically Signed   By: Anner Crete M.D.   On: 05/04/2021 21:27   ? ? ? LOS: 0 days  ? ? ?Flora Lipps, MD ?[facility] ?05/05/2021, 3:43 PM  ?  ?

## 2021-05-06 DIAGNOSIS — Z87898 Personal history of other specified conditions: Secondary | ICD-10-CM | POA: Diagnosis not present

## 2021-05-06 DIAGNOSIS — I442 Atrioventricular block, complete: Secondary | ICD-10-CM | POA: Diagnosis not present

## 2021-05-06 DIAGNOSIS — E876 Hypokalemia: Secondary | ICD-10-CM

## 2021-05-06 DIAGNOSIS — R55 Syncope and collapse: Secondary | ICD-10-CM | POA: Diagnosis not present

## 2021-05-06 DIAGNOSIS — E785 Hyperlipidemia, unspecified: Secondary | ICD-10-CM | POA: Diagnosis not present

## 2021-05-06 LAB — BASIC METABOLIC PANEL
Anion gap: 7 (ref 5–15)
BUN: 17 mg/dL (ref 8–23)
CO2: 25 mmol/L (ref 22–32)
Calcium: 8.9 mg/dL (ref 8.9–10.3)
Chloride: 101 mmol/L (ref 98–111)
Creatinine, Ser: 1.28 mg/dL — ABNORMAL HIGH (ref 0.61–1.24)
GFR, Estimated: 56 mL/min — ABNORMAL LOW (ref >60)
Glucose, Bld: 136 mg/dL — ABNORMAL HIGH (ref 70–99)
Potassium: 3.3 mmol/L — ABNORMAL LOW (ref 3.5–5.1)
Sodium: 133 mmol/L — ABNORMAL LOW (ref 135–145)

## 2021-05-06 LAB — CBC
HCT: 40.3 % (ref 39.0–52.0)
Hemoglobin: 13.7 g/dL (ref 13.0–17.0)
MCH: 31.2 pg (ref 26.0–34.0)
MCHC: 34 g/dL (ref 30.0–36.0)
MCV: 91.8 fL (ref 80.0–100.0)
Platelets: 242 10*3/uL (ref 150–400)
RBC: 4.39 MIL/uL (ref 4.22–5.81)
RDW: 12.5 % (ref 11.5–15.5)
WBC: 8.3 10*3/uL (ref 4.0–10.5)
nRBC: 0 % (ref 0.0–0.2)

## 2021-05-06 LAB — MAGNESIUM: Magnesium: 1.8 mg/dL (ref 1.7–2.4)

## 2021-05-06 MED ORDER — POTASSIUM CHLORIDE CRYS ER 20 MEQ PO TBCR
40.0000 meq | EXTENDED_RELEASE_TABLET | Freq: Two times a day (BID) | ORAL | Status: AC
Start: 2021-05-06 — End: 2021-05-07
  Administered 2021-05-06 – 2021-05-07 (×3): 40 meq via ORAL
  Filled 2021-05-06 (×3): qty 2

## 2021-05-06 NOTE — Progress Notes (Addendum)
?PROGRESS NOTE ? ? ? ?EVERRETT LACASSE  BWI:[id number] DOB: 13-Jan-1941 DOA: 05/04/2021 ?PCP: [name], MD  ? ? ?Brief Narrative:  ?Drew Smith is a 81 y.o. male with past medical history of seizure, hypertension, hyperlipidemia, paroxysmal atrial fibrillation not on anticoagulation and history of complete heart block presented to hospital with syncope while going to his church.  He also had some dizziness but no palpitation or shortness of breath.  Patient was previously seen by Dr. Lovena Le for unexplained seizure/syncope and had Zio patch monitoring which demonstrated nocturnal phase complete heart block in the past with atrial fibrillation.  Permanent pacemaker insertion was recommended including Eliquis for anticoagulation but since plan was to insert pacemaker he was not started on Eliquis yet.  In the ED, patient had normal sinus rhythm.  Cardiology was consulted and plan for possible pacemaker intervention at this time.  ? ?  ?Assessment and Plan: ?* Syncope ?History of seizure-like episode on Keppra.  He was followed by cardiology as outpatient and had a Zio patch which demonstrated nocturnal pauses and one complete heart block with paroxysmal atrial fibrillation.  Awaiting for possible pacemaker intervention. ? ?CHB (complete heart block) ([place]) ?History of CHB.   ?Cardiology on board.  Tentative plan for permanent pacemaker placement on 05/07/2021.   Cardiology also planning for cardiac MRI for possible hypertrophic cardiomyopathy ? ?Paroxysmal atrial fibrillation (HCC) ?Plan for initiating anticoagulation with Eliquis once intervention made.  Currently normal sinus rhythm. ? ?History of seizures ?Continue Keppra.  No active issues. ? ?Hypokalemia ?We will replenish with oral potassium chloride.  Check BMP in AM.  Latest magnesium of 1.8. ? ?Hypertension ?- Continue amlodipine and HCTZ.  Not on nodal blockers. ? ? ? DVT prophylaxis: enoxaparin (LOVENOX) injection 40 mg Start: 05/04/21  2300 ? ? ?Code Status:   ?  Code Status: Full Code ? ?Disposition: Home ? ?Status is: Inpatient ? ?The patient is  inpatient because: Need for possible pacemaker placement, ? ? Family Communication:  ?Spoke with the patient at bedside.  Spoke with the patient's son on the phone and updated him about the clinical condition of the patient. ? ?Consultants:  ?Cardiology ? ?Procedures:  ?None so far. ? ?Antimicrobials:  ?None ? ?Anti-infectives (From admission, onward)  ? ? None  ? ?  ? ?Subjective: ?Today, patient was seen and examined at bedside.  Patient denies any dizziness, lightheadedness chest pain or shortness of breath.  Feels frustrated about being in the hospital  ? ?Objective: ?Vitals:  ? 05/05/21 2322 05/06/21 0407 05/06/21 0800 05/06/21 1141  ?BP: 137/68 (!) 115/52  (!) 142/69  ?Pulse: 73 67 63   ?Resp: (!) '23 18 18   '$ ?Temp: 97.9 ?F (36.6 ?C) (!) 97.3 ?F (36.3 ?C) 97.6 ?F (36.4 ?C) 98.3 ?F (36.8 ?C)  ?TempSrc: Oral Oral Oral Oral  ?SpO2: 94% 94%    ?Weight:      ?Height:      ? ?No intake or output data in the 24 hours ending 05/06/21 1417 ? ?Filed Weights  ? 05/04/21 2009  ?Weight: 74.8 kg  ? ? ?Physical Examination: ? ?General:  Average built, not in obvious distress, elderly male ?HENT:   No scleral pallor or icterus noted. Oral mucosa is moist.  ?Chest:  Clear breath sounds.  Diminished breath sounds bilaterally. No crackles or wheezes.  ?CVS: S1 &S2 heard. No murmur.  Regular rate and rhythm. ?Abdomen: Soft, nontender, nondistended.  Bowel sounds are heard.   ?Extremities: No cyanosis, clubbing or  edema.  Peripheral pulses are palpable. ?Psych: Alert, awake and oriented, normal mood ?CNS:  No cranial nerve deficits.  Power equal in all extremities.   ?Skin: Warm and dry.  No rashes noted. ? ?Data Reviewed:  ? ?CBC: ?Recent Labs  ?Lab 05/04/21 ?2018 05/06/21 ?0008  ?WBC 6.8 8.3  ?NEUTROABS 4.8  --   ?HGB 14.3 13.7  ?HCT 40.4 40.3  ?MCV 91.6 91.8  ?PLT 240 242  ? ? ?Basic Metabolic Panel: ?Recent Labs   ?Lab 05/04/21 ?2018 05/05/21 ?0239 05/06/21 ?0008  ?NA 134* 138 133*  ?K 3.8 4.3 3.3*  ?CL 100 103 101  ?CO2 '24 27 25  '$ ?GLUCOSE 196* 93 136*  ?BUN '19 17 17  '$ ?CREATININE 1.13 1.00 1.28*  ?CALCIUM 8.8* 9.2 8.9  ?MG 1.8  --  1.8  ? ? ?Liver Function Tests: ?Recent Labs  ?Lab 05/04/21 ?2018  ?AST 19  ?ALT 14  ?ALKPHOS 57  ?BILITOT 0.5  ?PROT 6.6  ?ALBUMIN 3.6  ? ? ? ?Radiology Studies: ?DG Chest Portable 1 View ? ?Result Date: 05/04/2021 ?CLINICAL DATA:  Presyncope.  Weakness and dizziness. EXAM: PORTABLE CHEST 1 VIEW COMPARISON:  Chest radiograph dated 03/03/2021. FINDINGS: Elevation of the left hemidiaphragm similar to prior radiograph. No new consolidation. No pleural effusion pneumothorax. Stable cardiomediastinal silhouette. Atherosclerotic calcification of the aortic arch. No acute osseous pathology. IMPRESSION: No active disease. Electronically Signed   By: Anner Crete M.D.   On: 05/04/2021 21:27   ? ? ? LOS: 1 day  ? ? ?Flora Lipps, MD ?[facility] ?05/06/2021, 2:17 PM  ?  ?

## 2021-05-06 NOTE — Assessment & Plan Note (Addendum)
Patient was given potassium supplements orally.  We will continue on discharge ?

## 2021-05-06 NOTE — Progress Notes (Addendum)
Discussed w/Dr Caryl Comes on 05/05/2021. Appreciate his input. ?Recurrent syncope with episodic complete heart block associated with PP prolongation consistent with hyper vagotonia  ?Unexplained syncope, ?possible vagotonic.  ?Deep Q waves on echocardiogram, basal septal hypertrophy. ?Ordered cardiac MRI to evaluate for scar, HCM. ?Patient may need anxiolytics. ?Will follow MRI results and Dr. Tanna Furry recommendations. ?Has outpatient f/u his primary cardiologist Dr. Terri Skains on 06/05/2021. ? ? ?Drew Mormon, MD ?Pager: [phone] ?Office: [phone] ? ?

## 2021-05-07 ENCOUNTER — Inpatient Hospital Stay (HOSPITAL_COMMUNITY): Payer: PPO

## 2021-05-07 ENCOUNTER — Telehealth: Payer: Self-pay | Admitting: Family Medicine

## 2021-05-07 DIAGNOSIS — E785 Hyperlipidemia, unspecified: Secondary | ICD-10-CM | POA: Diagnosis not present

## 2021-05-07 DIAGNOSIS — R42 Dizziness and giddiness: Secondary | ICD-10-CM | POA: Diagnosis not present

## 2021-05-07 DIAGNOSIS — I422 Other hypertrophic cardiomyopathy: Secondary | ICD-10-CM

## 2021-05-07 DIAGNOSIS — I442 Atrioventricular block, complete: Secondary | ICD-10-CM | POA: Diagnosis not present

## 2021-05-07 DIAGNOSIS — Z87898 Personal history of other specified conditions: Secondary | ICD-10-CM | POA: Diagnosis not present

## 2021-05-07 MED ORDER — POTASSIUM CHLORIDE CRYS ER 20 MEQ PO TBCR
40.0000 meq | EXTENDED_RELEASE_TABLET | Freq: Once | ORAL | Status: AC
  Administered 2021-05-07: 40 meq via ORAL
  Filled 2021-05-07: qty 2

## 2021-05-07 MED ORDER — APIXABAN 5 MG PO TABS: 5.0000 mg | ORAL_TABLET | Freq: Two times a day (BID) | ORAL | 2 refills | Status: DC

## 2021-05-07 MED ORDER — APIXABAN 5 MG PO TABS: 5.0000 mg | ORAL_TABLET | Freq: Two times a day (BID) | ORAL | Status: DC

## 2021-05-07 MED ORDER — GADOBUTROL 1 MMOL/ML IV SOLN
8.0000 mL | Freq: Once | INTRAVENOUS | Status: AC | PRN
  Administered 2021-05-07: 8 mL via INTRAVENOUS

## 2021-05-07 MED ORDER — POTASSIUM CHLORIDE CRYS ER 20 MEQ PO TBCR: 20.0000 meq | EXTENDED_RELEASE_TABLET | Freq: Every day | ORAL | 0 refills | Status: DC

## 2021-05-07 NOTE — Discharge Summary (Signed)
Physician Discharge Summary   Patient: Drew Smith MRN: [id number] DOB: [birth year]  Admit date:     05/04/2021  Discharge date: 05/07/21  Discharge Physician: Flora Lipps   PCP: [name], MD   Recommendations at discharge:   Follow-up with cardiology Dr. Terri Skains as scheduled by the clinic. Follow-up with your primary care physician in 1 to 2 weeks  Discharge Diagnoses: Principal Problem:   Syncope Active Problems:   CHB (complete heart block) (HCC)   Paroxysmal atrial fibrillation (HCC)   History of seizures   Hypokalemia   Hypertension   Dyslipidemia  Resolved Problems:   * No resolved hospital problems. *  Hospital Course: Drew Smith is a 81 y.o. male with past medical history of seizure, hypertension, hyperlipidemia, paroxysmal atrial fibrillation not on anticoagulation and history of complete heart block presented to hospital with syncope while going to his church.  He also had some dizziness but no palpitation or shortness of breath.  Patient was previously seen by Dr. Lovena Le for unexplained seizure/syncope and had Zio patch monitoring which demonstrated nocturnal phase complete heart block in the past with atrial fibrillation.  Permanent pacemaker insertion was recommended including Eliquis for anticoagulation but since plan was to insert pacemaker he was not started on Eliquis yet.  In the ED, patient had normal sinus rhythm.  Cardiology was consulted and patient has undergone cardiac MRI.  Electrophysiology cardiology to evaluate the patient.  Assessment and Plan: * Syncope History of seizure-like episode on Keppra.  He was followed by cardiology as outpatient and had a Zio patch which demonstrated nocturnal pauses and one complete heart block with paroxysmal atrial fibrillation.  Status post cardiac MRI today.  Cardiology has seen the patient and at this time patient will be followed up as outpatient.  No plans for pacemaker placement  CHB (complete  heart block) (HCC) History of CHB.   Cardiology followed the patient during hospitalization.   Patient underwent cardiac MRI for evaluation of possible hypertrophic cardiomyopathy and  there was some asymmetric thickening of the septum.  Case was discussed with Dr. Lovena Le who recommended no need for pacemaker at this time and plan for Eliquis for paroxysmal atrial fibrillation.  Cardiology to follow the patient as outpatient.  Paroxysmal atrial fibrillation [facility]) Patient will be initiated on Eliquis on discharge for anticoagulation.  History of seizures Continue Keppra on discharge.  Hypokalemia Patient was given potassium supplements orally.  We will continue on discharge  Hypertension - Continue amlodipine and HCTZ.  Not on nodal blockers.  Blood pressure seems to be stable at this time.   Consultants: Cardiology  Procedures performed: None  Disposition: Home.  Spoke with the patient's daughter on the phone and updated her about the clinical condition of the patient as well.  Diet recommendation:  Discharge Diet Orders (From admission, onward)     Start     Ordered   05/07/21 0000  Diet - low sodium heart healthy        05/07/21 1719           Cardiac diet DISCHARGE MEDICATION: Allergies as of 05/07/2021   No Known Allergies      Medication List     STOP taking these medications    acyclovir 400 MG tablet Commonly known as: ZOVIRAX   ibuprofen 800 MG tablet Commonly known as: ADVIL       TAKE these medications    amLODipine 5 MG tablet Commonly known as: NORVASC Take 1 tablet (5 mg  total) by mouth daily.   apixaban 5 MG Tabs tablet Commonly known as: ELIQUIS Take 1 tablet (5 mg total) by mouth 2 (two) times daily.   azelastine 0.1 % nasal spray Commonly known as: Astelin Place 1 spray into both nostrils 2 (two) times daily. Use in each nostril as directed   CENTRUM SILVER 50+MEN PO Take 1 tablet by mouth daily as needed.   ciclopirox 0.77 %  cream Commonly known as: LOPROX APPLY TO AFFECTED AREA(S) TWO TIMES A DAY AS DIRECTED What changed: See the new instructions.   desonide 0.05 % cream Commonly known as: DESOWEN Apply 1 application. topically 2 (two) times daily.   gabapentin 100 MG capsule Commonly known as: NEURONTIN Take one to two capsules by mouth at night as needed for back pain.   hydrochlorothiazide 12.5 MG capsule Commonly known as: MICROZIDE Take 1 capsule (12.5 mg total) by mouth daily.   levETIRAcetam 500 MG tablet Commonly known as: Keppra Take 1 tablet (500 mg total) by mouth 2 (two) times daily.   levocetirizine 5 MG tablet Commonly known as: XYZAL TAKE ONE TABLET BY MOUTH EVERY EVENING   Nasal Spray 0.05 % Soln Place into the nose.   omeprazole 20 MG capsule Commonly known as: PRILOSEC TAKE ONE CAPSULE BY MOUTH TWICE A DAY What changed:  how to take this when to take this   potassium chloride SA 20 MEQ tablet Commonly known as: KLOR-CON M Take 1 tablet (20 mEq total) by mouth daily for 5 days.   traMADol 50 MG tablet Commonly known as: ULTRAM Take 50 mg by mouth every 6 (six) hours as needed.   tretinoin 0.05 % cream Commonly known as: RETIN-A   VITAMIN D (CHOLECALCIFEROL) PO Take 1 tablet by mouth every other day. 5000 units every other day   zinc gluconate 50 MG tablet Take 50 mg by mouth daily as needed.        Follow-up Information     Tolia, Sunit, DO Follow up.   Specialties: Cardiology, Vascular Surgery Contact information: [place] [address] [phone]         [name], MD Follow up in 1 week(s).   Specialty: Family Medicine Contact information: [place] [address] [phone]                Subjective Today, patient was seen and examined at bedside.  Patient denies any dizziness, lightheadedness shortness of breath, chest pain or palpitation.   Discharge Exam: Filed Weights    05/04/21 2009  Weight: 74.8 kg   Vitals with BMI 05/07/2021 05/07/2021 05/07/2021  Height - - -  Weight - - -  BMI - - -  Systolic 092 330 076  Diastolic 70 66 47  Pulse 63 60 62    General:  Average built, not in obvious distress, elderly male HENT:   No scleral pallor or icterus noted. Oral mucosa is moist.  Chest:  Clear breath sounds.  Diminished breath sounds bilaterally. No crackles or wheezes.  CVS: S1 &S2 heard. No murmur.  Regular rate and rhythm. Abdomen: Soft, nontender, nondistended.  Bowel sounds are heard.   Extremities: No cyanosis, clubbing or edema.  Peripheral pulses are palpable. Psych: Alert, awake and oriented, normal mood CNS:  No cranial nerve deficits.  Power equal in all extremities.   Skin: Warm and dry.  No rashes noted.   Condition at discharge: good  The results of significant diagnostics from  this hospitalization (including imaging, microbiology, ancillary and laboratory) are listed below for reference.   Imaging Studies: DG Chest Portable 1 View  Result Date: 05/04/2021 CLINICAL DATA:  Presyncope.  Weakness and dizziness. EXAM: PORTABLE CHEST 1 VIEW COMPARISON:  Chest radiograph dated 03/03/2021. FINDINGS: Elevation of the left hemidiaphragm similar to prior radiograph. No new consolidation. No pleural effusion pneumothorax. Stable cardiomediastinal silhouette. Atherosclerotic calcification of the aortic arch. No acute osseous pathology. IMPRESSION: No active disease. Electronically Signed   By: Anner Crete M.D.   On: 05/04/2021 21:27   MR CARDIAC MORPHOLOGY W WO CONTRAST  Result Date: 05/07/2021 CLINICAL DATA:  1M with HTN, Afib p/w syncope, had nocturnal pauses and episode of CHB on monitor. Asymmetric basal septal hypertrophy on echo EXAM: CARDIAC MRI TECHNIQUE: The patient was scanned on a 1.5 Tesla Siemens magnet. A dedicated cardiac coil was used. Functional imaging was done using Fiesta sequences. 2,3, and 4 chamber views were done to  assess for RWMA's. Modified Simpson's rule using a short axis stack was used to calculate an ejection fraction on a dedicated work Conservation officer, nature. The patient received 8 cc of Gadavist. After 10 minutes inversion recovery sequences were used to assess for infiltration and scar tissue. CONTRAST:  8 cc  of Gadavist FINDINGS: Left ventricle: -Asymmetric septal hypertrophy measuring up to 76m in basal septum (159min posterior wall) -Normal size -Normal systolic function -Normal ECV (25%) -Patchy LGE in basal septum and lateral wall. LGE accounts for 5% of total myocardial mass LV EF: 54% (Normal 56-78%) Absolute volumes: LV EDV: 11592mNormal 77-195 mL) LV ESV: 14m43mormal 19-72 mL) LV SV: 62mL76mrmal 51-133 mL) CO: 4.2L/min (Normal 2.8-8.8 L/min) Indexed volumes: LV EDV: 61mL/32m (Normal 47-92 mL/sq-m) LV ESV: 28mL/s31m(Normal 13-30 mL/sq-m) LV SV: 33mL/sq59mNormal 32-62 mL/sq-m) CI: 2.2L/min/sq-m (Normal 1.7-4.2 L/min/sq-m) Right ventricle: Normal size and systolic function RV EF:  55% (Normal 47-74%) Absolute volumes: RV EDV: 125mL (No54m 88-227 mL) RV ESV: 57mL (Nor47m23-103 mL) RV SV: 68mL (Norm36m2-138 mL) CO: 4.6L/min (Normal 2.8-8.8 L/min) Indexed volumes: RV EDV: 66mL/sq-m (61mal 55-105 mL/sq-m) RV ESV: 30mL/sq-m (N52ml 15-43 mL/sq-m) RV SV: 36mL/sq-m (No28m 32-64 mL/sq-m) CI: 2.4L/min/sq-m (Normal 1.7-4.2 L/min/sq-m) Left atrium: Normal size Right atrium: Mild enlargement Mitral valve: Trivial regurgitation Aortic valve: Tricuspid.  No regurgitation Tricuspid valve: Mild regurgitation Pulmonic valve: No regurgitation Aorta: Normal proximal ascending aorta Pericardium: Normal IMPRESSION: 1. Asymmetric septal hypertrophy measuring up to 16mm in basal 89mum (10mm in posteri51mall), meeting criteria for hypertrophic cardiomyopathy 2. Patchy late gadolinium enhancement in basal septum and lateral wall, consistent with HCM. LGE accounts for 5% of total myocardial mass 3.  Normal LV size  and systolic function (EF 54%) 4.  Normal 53%size and systolic function (EF 55%) Electronica29% Signed   By: Christopher  SchOswaldo Milian3/2023 14:53   LONG TERM MONITOR (3-14 DAYS)  Result Date: 05/01/2021 14 day extended Holter monitor: Dominant rhythm normal sinus. Heart rate 22-146 bpm.  Avg HR 66 bpm. Atrial fibrillation burden 3% (heart rate 42-146 bpm, avg HR 89 bpm) 6 pauses greater than 3 seconds in duration (longest 7.3 seconds, underlying rhythm sinus, 2:33 AM, 03/25/2021). On 03/29/2021, 8:20 AM, underlying rhythm sinus, with high degree AV block with 3.8 second pause. Total ventricular ectopic burden <1%. Total supraventricular ectopic burden <1%. Patient triggered events: 2.  Normal sinus rhythm without any dysrhythmias.  PCV CARDIAC STRESS TEST  Result Date: 04/15/2021 Exercise treadmill stress test 04/13/2021: Exercise  treadmill stress test performed using Bruce protocol.  Patient reached 4.6 METS, and 87% of age predicted maximum heart rate.  Exercise capacity was low.  No chest pain reported.  Normal heart rate response with hypertensive exercise response. Peak BP 220/80 mmHg. Stress EKG shows no ischemic changes. Low risk study.    Microbiology: Results for orders placed or performed during the hospital encounter of 05/04/21  Resp Panel by RT-PCR (Flu A&B, Covid) Nasopharyngeal Swab     Status: None   Collection Time: 05/04/21 11:33 PM   Specimen: Nasopharyngeal Swab; Nasopharyngeal(NP) swabs in vial transport medium  Result Value Ref Range Status   SARS Coronavirus 2 by RT PCR NEGATIVE NEGATIVE Final    Comment: (NOTE) SARS-CoV-2 target nucleic acids are NOT DETECTED.  The SARS-CoV-2 RNA is generally detectable in upper respiratory specimens during the acute phase of infection. The lowest concentration of SARS-CoV-2 viral copies this assay can detect is 138 copies/mL. A negative result does not preclude SARS-Cov-2 infection and should not be used as the sole basis for  treatment or other patient management decisions. A negative result may occur with  improper specimen collection/handling, submission of specimen other than nasopharyngeal swab, presence of viral mutation(s) within the areas targeted by this assay, and inadequate number of viral copies(<138 copies/mL). A negative result must be combined with clinical observations, patient history, and epidemiological information. The expected result is Negative.  Fact Sheet for Patients:  EntrepreneurPulse.com.au  Fact Sheet for Healthcare Providers:  IncredibleEmployment.be  This test is no t yet approved or cleared by the [place] FDA and  has been authorized for detection and/or diagnosis of SARS-CoV-2 by FDA under an Emergency Use Authorization (EUA). This EUA will remain  in effect (meaning this test can be used) for the duration of the COVID-19 declaration under Section 564(b)(1) of the Act, 21 U.S.C.section 360bbb-3(b)(1), unless the authorization is terminated  or revoked sooner.       Influenza A by PCR NEGATIVE NEGATIVE Final   Influenza B by PCR NEGATIVE NEGATIVE Final    Comment: (NOTE) The Xpert Xpress SARS-CoV-2/FLU/RSV plus assay is intended as an aid in the diagnosis of influenza from Nasopharyngeal swab specimens and should not be used as a sole basis for treatment. Nasal washings and aspirates are unacceptable for Xpert Xpress SARS-CoV-2/FLU/RSV testing.  Fact Sheet for Patients: EntrepreneurPulse.com.au  Fact Sheet for Healthcare Providers: IncredibleEmployment.be  This test is not yet approved or cleared by the [place] FDA and has been authorized for detection and/or diagnosis of SARS-CoV-2 by FDA under an Emergency Use Authorization (EUA). This EUA will remain in effect (meaning this test can be used) for the duration of the COVID-19 declaration under Section 564(b)(1) of the Act, 21  U.S.C. section 360bbb-3(b)(1), unless the authorization is terminated or revoked.  Performed at [facility], [place] [address] [place], [place] [address]     Labs: CBC: Recent Labs  Lab 05/04/21 2018 05/06/21 0008  WBC 6.8 8.3  NEUTROABS 4.8  --   HGB 14.3 13.7  HCT 40.4 40.3  MCV 91.6 91.8  PLT 240 604   Basic Metabolic Panel: Recent Labs  Lab 05/04/21 2018 05/05/21 0239 05/06/21 0008  NA 134* 138 133*  K 3.8 4.3 3.3*  CL 100 103 101  CO2 '24 27 25  '$ GLUCOSE 196* 93 136*  BUN '19 17 17  '$ CREATININE 1.13 1.00 1.28*  CALCIUM 8.8* 9.2 8.9  MG 1.8  --  1.8   Liver Function Tests:  Recent Labs  Lab 05/04/21 2018  AST 19  ALT 14  ALKPHOS 57  BILITOT 0.5  PROT 6.6  ALBUMIN 3.6   CBG: Recent Labs  Lab 05/04/21 2029  GLUCAP 206*    Discharge time spent: greater than 30 minutes.  Signed: Flora Lipps, MD [facility] 05/07/2021

## 2021-05-07 NOTE — Progress Notes (Cosign Needed Addendum)
Progress Note  Patient Name: Drew Smith Date of Encounter: 05/07/2021  [facility] Cardiologist: Dr. Terri Skains  Subjective   Doing well, would like to go home  Inpatient Medications    Scheduled Meds:  amLODipine  5 mg Oral Daily   enoxaparin (LOVENOX) injection  40 mg Subcutaneous Q24H   hydrochlorothiazide  12.5 mg Oral Daily   levETIRAcetam  500 mg Oral BID   pantoprazole  40 mg Oral Daily   potassium chloride  40 mEq Oral BID   Continuous Infusions:  PRN Meds: gabapentin, hydrocortisone cream   Vital Signs    Vitals:   05/06/21 2000 05/07/21 0045 05/07/21 0419 05/07/21 0756  BP: 120/61 (!) 103/57 (!) 105/47 113/66  Pulse: 64 62 62 60  Resp: '20 18 17 18  '$ Temp: 98.2 F (36.8 C) 97.7 F (36.5 C) 98.1 F (36.7 C) 98.1 F (36.7 C)  TempSrc: Oral Axillary Axillary Axillary  SpO2: 95% 94% 93% 95%  Weight:      Height:       No intake or output data in the 24 hours ending 05/07/21 0850 Last 3 Weights 05/04/2021 05/04/2021 05/01/2021  Weight (lbs) 165 lb 165 lb 167 lb  Weight (kg) 74.844 kg 74.844 kg 75.751 kg      Telemetry    SR no AVblock or bradycardia- Personally Reviewed  ECG    No new EKGs  - Personally Reviewed  Physical Exam   GEN: No acute distress.   Neck: No JVD Cardiac: RRR, no murmurs, rubs, or gallops.  Respiratory: CTA b/l. GI: Soft, nontender, non-distended  MS: No edema; No deformity. Neuro:  Nonfocal  Psych: Normal affect   Labs    High Sensitivity Troponin:   Recent Labs  Lab 05/04/21 2018 05/04/21 2317  TROPONINIHS 11 18*     Chemistry Recent Labs  Lab 05/04/21 2018 05/05/21 0239 05/06/21 0008  NA 134* 138 133*  K 3.8 4.3 3.3*  CL 100 103 101  CO2 '24 27 25  '$ GLUCOSE 196* 93 136*  BUN '19 17 17  '$ CREATININE 1.13 1.00 1.28*  CALCIUM 8.8* 9.2 8.9  MG 1.8  --  1.8  PROT 6.6  --   --   ALBUMIN 3.6  --   --   AST 19  --   --   ALT 14  --   --   ALKPHOS 57  --   --   BILITOT 0.5  --   --   GFRNONAA >60 >60  56*  ANIONGAP '10 8 7    '$ Lipids No results for input(s): CHOL, TRIG, HDL, LABVLDL, LDLCALC, CHOLHDL in the last 168 hours.  Hematology Recent Labs  Lab 05/04/21 2018 05/06/21 0008  WBC 6.8 8.3  RBC 4.41 4.39  HGB 14.3 13.7  HCT 40.4 40.3  MCV 91.6 91.8  MCH 32.4 31.2  MCHC 35.4 34.0  RDW 12.2 12.5  PLT 240 242   Thyroid No results for input(s): TSH, FREET4 in the last 168 hours.  BNPNo results for input(s): BNP, PROBNP in the last 168 hours.  DDimer No results for input(s): DDIMER in the last 168 hours.   Radiology    No results found.  Cardiac Studies    03/27/21: TTE Normal LV systolic function with visual EF 60-65%. Left ventricle cavity  is normal in size. Moderate left ventricular hypertrophy, prominent basal  septum (IVSd1.67cm and LVPWd 1.23cm). Normal global wall motion. Normal  diastolic filling pattern, normal LAP.  Trace aortic regurgitation.  Trace tricuspid regurgitation. No evidence of pulmonary hypertension.  No prior study for comparison.  Patient Profile     81 y.o. male w/PMHx of HTN, HLD, seizure d/o (w/hx of head trauma), and newly found AFib  Referred to Dr. Lovena Le out pt for nocturnal episodes of syncope/convulsions, attending cardiologist did monitoring that noted PAfib and nocturnal pauses and an episode of CHB. Dr. Lovena Le did not think his nocturnal convulsions were associated with his pauses, but did have early AM CHB and they did discuss PPM.  The pt planned to give it some thought. Start eliquis pending decision on pacing or not.  He was admitted to [facility] for near syncope, dizziness. Dr. Caryl Comes consulted and suspected the event leading to admission, unrealated to HR/rhythm, prior monitor findings.  Suspected monitor findings more likely 2/2 sleep apnea, including the 08:30 CHB, pt reported often would still be asleep then.  He did raise some concern of asymmetric septal hypertrophy on his echo, planned for c.MRI prior to final decisions on  pacing  Assessment & Plan    Near syncope, dizzy spell 2.   Nocturnal pauses 3.   CHB (08:30 am on his monitor, seems uncertain if awake or not)  This event occurred after walking from a parking lot and then up 2 flights of stairs, became weak, lightheaded, somewhat better after some sweet tea, then weak again Apparently a bystander (pediatrician) applied his watch on the pt with reports f a regular rhythm.  EMS found him hypertensive w/SBP 190 on arrival, HRs 80's-90s  Labs largely unremarkable Not on any nodal blocking agents With fast AFib and bradycardia both may benefit from pacing  The patient feels strongly that the event was driven by low BS, he reports he knows this about himself, gets a little weak/shaky if he goes more then a couple hours without at least snacking. He did not feel like he was going to faint, more like he was weak, unsteady.  4. PAfib CHA2DS2Vasc is 3   Dr. Lovena Le will see later this AM  ADEND: Dr. Lovena Le has seen the patient. No plans for PPM at this time Discussed with Dr. Charolette Child as well OK to discharge from cardiology perspective  START eliquis '5mg'$  PO BID for Paroxysmal Afib on his outpatient monitor Follow out patient with attending cardiologist , Dr. Terri Skains, they will arrange    For questions or updates, please contact [place] Please consult [email or web address] for contact info under        Signed, Baldwin Jamaica, PA-C  05/07/2021, 8:50 AM

## 2021-05-07 NOTE — Progress Notes (Signed)
?  Transition of Care (TOC) Screening Note ? ? ?Patient Details  ?Name: Drew Smith ?Date of Birth: [birth year] ? ? ?Transition of Care (TOC) CM/SW Contact:    ?Cyndi Bender, RN ?Phone Number: ?05/07/2021, 8:38 AM ? ? ? ?Transition of Care Department [facility]) has reviewed patient and no TOC needs have been identified at this time. We will continue to monitor patient advancement through interdisciplinary progression rounds. If new patient transition needs arise, please place a TOC consult. ? ? ?

## 2021-05-07 NOTE — Progress Notes (Addendum)
?PROGRESS NOTE ? ? ? ?Drew Smith  VHQ:[id number] DOB: 02/09/1941 DOA: 05/04/2021 ?PCP: [name], MD  ? ? ?Brief Narrative:  ?Drew Smith is a 81 y.o. male with past medical history of seizure, hypertension, hyperlipidemia, paroxysmal atrial fibrillation not on anticoagulation and history of complete heart block presented to hospital with syncope while going to his church.  He also had some dizziness but no palpitation or shortness of breath.  Patient was previously seen by Dr. Lovena Le for unexplained seizure/syncope and had Zio patch monitoring which demonstrated nocturnal phase complete heart block in the past with atrial fibrillation.  Permanent pacemaker insertion was recommended including Eliquis for anticoagulation but since plan was to insert pacemaker he was not started on Eliquis yet.  In the ED, patient had normal sinus rhythm.  Cardiology was consulted and patient has undergone cardiac MRI.  Electrophysiology cardiology to evaluate the patient.  ? ?  ?Assessment and Plan: ?* Syncope ?History of seizure-like episode on Keppra.  He was followed by cardiology as outpatient and had a Zio patch which demonstrated nocturnal pauses and one complete heart block with paroxysmal atrial fibrillation.  Has undergone cardiac MRI today.  Awaiting for electrophysiology cardiology evaluation. ? ?CHB (complete heart block) ([place]) ?History of CHB.   ?Cardiology on board.  Tentative plan for permanent pacemaker placement on 05/07/2021.  Patient underwent cardiac MRI for evaluation of possible hypertrophic cardiomyopathy.  Electrophysiology cardiology to see the patient today for further evaluation and management plan. ? ?Paroxysmal atrial fibrillation ([place]) ?Plan for initiating anticoagulation with Eliquis once okay with cardiology..  Currently normal sinus rhythm. ? ?History of seizures ?Continue Keppra.  No active issues. ? ?Hypokalemia ?We will continue to replenish.  Potassium this morning at  3.3. ? ?Hypertension ?- Continue amlodipine and HCTZ.  Not on nodal blockers.  Blood pressure seems to be stable at this time. ? ? ? DVT prophylaxis: enoxaparin (LOVENOX) injection 40 mg Start: 05/04/21 2300 ? ? ?Code Status:   ?  Code Status: Full Code ? ?Disposition: Home in 1 to 2 days when okay with cardiology. ? ?Status is: Inpatient ? ?The patient is  inpatient because: Cardiac MR,  electrophysiology cardiology evaluation. ? ? Family Communication:  ?Spoke with the patient's son on the phone on 05/06/2021  ? ?consultants:  ?Cardiology ? ?Procedures:  ?None so far. ? ?Antimicrobials:  ?None ? ?Anti-infectives (From admission, onward)  ? ? None  ? ?  ? ?Subjective: ?Today, patient was seen and examined at bedside.  Patient denies any dizziness lightheadedness shortness of breath chest pain or palpitation. ? ?Objective: ?Vitals:  ? 05/07/21 0045 05/07/21 0419 05/07/21 0756 05/07/21 1155  ?BP: (!) 103/57 (!) 105/47 113/66 124/70  ?Pulse: 62 62 60 63  ?Resp: '18 17 18 18  '$ ?Temp: 97.7 ?F (36.5 ?C) 98.1 ?F (36.7 ?C) 98.1 ?F (36.7 ?C) 98.9 ?F (37.2 ?C)  ?TempSrc: Axillary Axillary Axillary Oral  ?SpO2: 94% 93% 95% 95%  ?Weight:      ?Height:      ? ?No intake or output data in the 24 hours ending 05/07/21 1412 ? ?Filed Weights  ? 05/04/21 2009  ?Weight: 74.8 kg  ? ? ?Physical Examination: ? ?General:  Average built, not in obvious distress, elderly male ?HENT:   No scleral pallor or icterus noted. Oral mucosa is moist.  ?Chest:  Clear breath sounds.  Diminished breath sounds bilaterally. No crackles or wheezes.  ?CVS: S1 &S2 heard. No murmur.  Regular rate and rhythm. ?Abdomen: Soft,  nontender, nondistended.  Bowel sounds are heard.   ?Extremities: No cyanosis, clubbing or edema.  Peripheral pulses are palpable. ?Psych: Alert, awake and oriented, normal mood ?CNS:  No cranial nerve deficits.  Power equal in all extremities.   ?Skin: Warm and dry.  No rashes noted. ? ? ?Data Reviewed:  ? ?CBC: ?Recent Labs  ?Lab  05/04/21 ?2018 05/06/21 ?0008  ?WBC 6.8 8.3  ?NEUTROABS 4.8  --   ?HGB 14.3 13.7  ?HCT 40.4 40.3  ?MCV 91.6 91.8  ?PLT 240 242  ? ? ?Basic Metabolic Panel: ?Recent Labs  ?Lab 05/04/21 ?2018 05/05/21 ?0239 05/06/21 ?0008  ?NA 134* 138 133*  ?K 3.8 4.3 3.3*  ?CL 100 103 101  ?CO2 '24 27 25  '$ ?GLUCOSE 196* 93 136*  ?BUN '19 17 17  '$ ?CREATININE 1.13 1.00 1.28*  ?CALCIUM 8.8* 9.2 8.9  ?MG 1.8  --  1.8  ? ? ?Liver Function Tests: ?Recent Labs  ?Lab 05/04/21 ?2018  ?AST 19  ?ALT 14  ?ALKPHOS 57  ?BILITOT 0.5  ?PROT 6.6  ?ALBUMIN 3.6  ? ? ? ?Radiology Studies: ?No results found. ? ? ? LOS: 2 days  ? ? ?Flora Lipps, MD ?[facility] ?05/07/2021, 2:12 PM  ?  ?

## 2021-05-08 ENCOUNTER — Telehealth: Payer: Self-pay

## 2021-05-08 NOTE — Telephone Encounter (Signed)
Transition Care Management Follow-up Telephone Call ?Date of discharge and from where: [facility] 05-07-21 Dx: syncope ?How have you been since you were released from the hospital? Doing ok  ?Any questions or concerns? No ? ?Items Reviewed: ?Did the pt receive and understand the discharge instructions provided? Yes  ?Medications obtained and verified? Yes  ?Other? No  ?Any new allergies since your discharge? No  ?Dietary orders reviewed? Yes ?Do you have support at home? Yes  ? ?Home Care and Equipment/Supplies: ?Were home health services ordered? no ?If so, what is the name of the agency? na  ?Has the agency set up a time to come to the patient's home? no ?Were any new equipment or medical supplies ordered?  No ?What is the name of the medical supply agency? na ?Were you able to get the supplies/equipment? no ?Do you have any questions related to the use of the equipment or supplies? No ? ?Functional Questionnaire: (I = Independent and D = Dependent) ?ADLs: I ? ?Bathing/Dressing- I ? ?Meal Prep- I ? ?Eating- I ? ?Maintaining continence- I ? ?Transferring/Ambulation- I ? ?Managing Meds- I ? ?Follow up appointments reviewed: ? ?PCP Hospital f/u appt confirmed? Yes  Scheduled to see Dr Elease Hashimoto on 05-15-21 @ 1115am. ?[facility] f/u appt confirmed? Yes  Scheduled to see Dr Terri Skains on 06-05-21 @ 145pm. ?Are transportation arrangements needed? No  ?If their condition worsens, is the pt aware to call PCP or go to the Emergency Dept.? Yes ?Was the patient provided with contact information for the PCP's office or ED? Yes ?Was to pt encouraged to call back with questions or concerns? Yes  ?

## 2021-05-14 NOTE — Telephone Encounter (Signed)
error 

## 2021-05-15 ENCOUNTER — Ambulatory Visit (INDEPENDENT_AMBULATORY_CARE_PROVIDER_SITE_OTHER): Payer: PPO | Admitting: Family Medicine

## 2021-05-15 ENCOUNTER — Encounter: Payer: Self-pay | Admitting: Family Medicine

## 2021-05-15 VITALS — BP 130/60 | HR 80 | Temp 98.3°F | Ht 68.0 in | Wt 164.5 lb

## 2021-05-15 DIAGNOSIS — E876 Hypokalemia: Secondary | ICD-10-CM | POA: Diagnosis not present

## 2021-05-15 DIAGNOSIS — I10 Essential (primary) hypertension: Secondary | ICD-10-CM

## 2021-05-15 DIAGNOSIS — Z9181 History of falling: Secondary | ICD-10-CM | POA: Diagnosis not present

## 2021-05-15 DIAGNOSIS — I480 Paroxysmal atrial fibrillation: Secondary | ICD-10-CM

## 2021-05-15 DIAGNOSIS — I421 Obstructive hypertrophic cardiomyopathy: Secondary | ICD-10-CM | POA: Diagnosis not present

## 2021-05-15 DIAGNOSIS — R739 Hyperglycemia, unspecified: Secondary | ICD-10-CM

## 2021-05-15 LAB — BASIC METABOLIC PANEL
BUN: 17 mg/dL (ref 6–23)
CO2: 27 mEq/L (ref 19–32)
Calcium: 9.5 mg/dL (ref 8.4–10.5)
Chloride: 101 mEq/L (ref 96–112)
Creatinine, Ser: 1.07 mg/dL (ref 0.40–1.50)
GFR: 64.99 mL/min (ref >60.00)
Glucose, Bld: 146 mg/dL — ABNORMAL HIGH (ref 70–99)
Potassium: 4.1 mEq/L (ref 3.5–5.1)
Sodium: 135 mEq/L (ref 135–145)

## 2021-05-15 LAB — HEMOGLOBIN A1C: Hgb A1c MFr Bld: 5.5 % (ref 4.6–6.5)

## 2021-05-15 NOTE — Progress Notes (Signed)
Established Patient Office Visit  Subjective:  Patient ID: Drew Smith, male    DOB: Sep 11, 1940  Age: 81 y.o. MRN: [id number]  CC:  Chief Complaint  Patient presents with   Hospitalization Follow-up    HPI Drew Smith presents for transitional care hospital follow-up.  He has history of hypertension, seizure, hyperlipidemia, atrial fibrillation, complete heart block.  He was at a church function and apparently became very weak and dizzy.  No chest pain.  No dyspnea.  Was taken in by EMS for further evaluation.  He apparently has not had any recent evidence for seizure activity.  He is on Keppra.  There have been to some discussion of permanent pacemaker but apparently decision was against that.  He is on Eliquis but apparently only taking once daily.  He had mild hypokalemia during hospitalization.  He was given replacement.  His magnesium was normal.  He is concerned about diabetes.  He had several elevated blood sugars but several these were nonfasting.  Strong family history of diabetes.  He had cardiac MRI which did show asymmetric septal hypertrophy up to 16 mm basal septum meeting criteria for hypertrophic cardiomyopathy.  Systolic ejection fraction about 54%.  Generally weak with much less activity recently.  He also has had some balance issues and fear of falling.  This is limited his activity somewhat.  Until recent months he was golfing actively.  Past Medical History:  Diagnosis Date   Arthritis    Asthma    Cancer (HCC)    prostate   GERD (gastroesophageal reflux disease)    Guillain-Barre syndrome (HCC) 02/25/1998   Hyperlipidemia    Hypertension    Seizures (HCC)     Past Surgical History:  Procedure Laterality Date   CATARACT EXTRACTION     EYE SURGERY  02/26/1943   81 years old   HERNIA REPAIR  02/25/1993   NOSE SURGERY  02/26/1988   PROSTATE SURGERY  02/25/1998   seed inplants   SHOULDER SURGERY  02/25/2006    Family History  Problem Relation  Age of Onset   Alcohol abuse Father    Hyperlipidemia Brother    Stroke Brother    Diabetes Brother        type ll    Social History   Socioeconomic History   Marital status: Divorced    Spouse name: Not on file   Number of children: 3   Years of education: Not on file   Highest education level: Some college, no degree  Occupational History   Occupation: retired  Tobacco Use   Smoking status: Never   Smokeless tobacco: Never  Vaping Use   Vaping Use: Never used  Substance and Sexual Activity   Alcohol use: Yes    Alcohol/week: 7.0 standard drinks    Types: 7 Glasses of wine per week    Comment: have 1/2 a day; wine or other    Drug use: No   Sexual activity: Not Currently  Other Topics Concern   Not on file  Social History Narrative   Lives alone in one level. Has two sons 1 daughter and friends who serve as support.   Former Psychologist, prison and probation services; Enjoys Mudlogger, reading   Originally from [place]   Social Determinants of Corporate investment banker Strain: Low Risk    Difficulty of Paying Living Expenses: Not hard at [employer] Insecurity: No Food Insecurity   Worried About Programme researcher, broadcasting/film/video in the Last Year: Never  true   Ran Out of Food in the Last Year: Never true  Transportation Needs: Not on file  Physical Activity: Sufficiently Active   Days of Exercise per Week: 5 days   Minutes of Exercise per Session: 60 min  Stress: No Stress Concern Present   Feeling of Stress : Not at all  Social Connections: Moderately Integrated   Frequency of Communication with Friends and Family: More than three times a week   Frequency of Social Gatherings with Friends and Family: More than three times a week   Attends Religious Services: 1 to 4 times per year   Active Member of [employer] or Organizations: Yes   Attends Engineer, structural: More than 4 times per year   Marital Status: Divorced  Catering manager Violence: Not At Risk   Fear of Current or Ex-Partner: No    Emotionally Abused: No   Physically Abused: No   Sexually Abused: No    Outpatient Medications Prior to Visit  Medication Sig Dispense Refill   amLODipine (NORVASC) 5 MG tablet Take 1 tablet (5 mg total) by mouth daily. 90 tablet 3   apixaban (ELIQUIS) 5 MG TABS tablet Take 1 tablet (5 mg total) by mouth 2 (two) times daily. 60 tablet 2   azelastine (ASTELIN) 0.1 % nasal spray Place 1 spray into both nostrils 2 (two) times daily. Use in each nostril as directed 30 mL 12   ciclopirox (LOPROX) 0.77 % cream APPLY TO AFFECTED AREA(S) TWO TIMES A DAY AS DIRECTED (Patient taking differently: Apply 1 application. topically 2 (two) times daily.) 15 g 0   desonide (DESOWEN) 0.05 % cream Apply 1 application. topically 2 (two) times daily.     gabapentin (NEURONTIN) 100 MG capsule Take one to two capsules by mouth at night as needed for back pain. 60 capsule 3   hydrochlorothiazide (MICROZIDE) 12.5 MG capsule Take 1 capsule (12.5 mg total) by mouth daily. 90 capsule 3   levETIRAcetam (KEPPRA) 500 MG tablet Take 1 tablet (500 mg total) by mouth 2 (two) times daily. 180 tablet 3   levocetirizine (XYZAL) 5 MG tablet TAKE ONE TABLET BY MOUTH EVERY EVENING 90 tablet 0   Multiple Vitamins-Minerals (CENTRUM SILVER 50+MEN PO) Take 1 tablet by mouth daily as needed.     omeprazole (PRILOSEC) 20 MG capsule TAKE ONE CAPSULE BY MOUTH TWICE A DAY (Patient taking differently: 20 mg daily.) 180 capsule 0   Oxymetazoline HCl (NASAL SPRAY) 0.05 % SOLN Place into the nose.     traMADol (ULTRAM) 50 MG tablet Take 50 mg by mouth every 6 (six) hours as needed.     tretinoin (RETIN-A) 0.05 % cream      VITAMIN D, CHOLECALCIFEROL, PO Take 1 tablet by mouth every other day. 5000 units every other day     zinc gluconate 50 MG tablet Take 50 mg by mouth daily as needed.     potassium chloride SA (KLOR-CON M) 20 MEQ tablet Take 1 tablet (20 mEq total) by mouth daily for 5 days. 5 tablet 0   No facility-administered medications  prior to visit.    No Known Allergies  ROS Review of Systems  Constitutional:  Positive for fatigue. Negative for chills and fever.  Respiratory:  Negative for cough.   Cardiovascular:  Negative for chest pain.  Gastrointestinal:  Negative for abdominal pain.  Endocrine: Negative for polydipsia and polyuria.  Genitourinary:  Negative for dysuria.     Objective:      BP  130/60 (BP Location: Left Arm, Patient Position: Sitting, Cuff Size: Normal)   Pulse 80   Temp 98.3 F (36.8 C) (Oral)   Ht 5\' 8"  (1.727 m)   Wt 164 lb 8 oz (74.6 kg)   SpO2 95%   BMI 25.01 kg/m  Wt Readings from Last 3 Encounters:  05/15/21 164 lb 8 oz (74.6 kg)  05/04/21 165 lb (74.8 kg)  05/04/21 165 lb (74.8 kg)     Health Maintenance Due  Topic Date Due   COVID-19 Vaccine (1) Never done   Zoster Vaccines- Shingrix (1 of 2) Never done   Pneumonia Vaccine 40+ Years old (2 - PCV) 11/28/2011    There are no preventive care reminders to display for this patient.  Lab Results  Component Value Date   TSH 1.63 08/29/2020   Lab Results  Component Value Date   WBC 8.3 05/06/2021   HGB 13.7 05/06/2021   HCT 40.3 05/06/2021   MCV 91.8 05/06/2021   PLT 242 05/06/2021   Lab Results  Component Value Date   NA 133 (L) 05/06/2021   K 3.3 (L) 05/06/2021   CO2 25 05/06/2021   GLUCOSE 136 (H) 05/06/2021   BUN 17 05/06/2021   CREATININE 1.28 (H) 05/06/2021   BILITOT 0.5 05/04/2021   ALKPHOS 57 05/04/2021   AST 19 05/04/2021   ALT 14 05/04/2021   PROT 6.6 05/04/2021   ALBUMIN 3.6 05/04/2021   CALCIUM 8.9 05/06/2021   ANIONGAP 7 05/06/2021   GFR 80.97 02/09/2020   Lab Results  Component Value Date   CHOL 163 04/05/2019   Lab Results  Component Value Date   HDL 47.60 04/05/2019   Lab Results  Component Value Date   LDLCALC 95 04/05/2019   Lab Results  Component Value Date   TRIG 103.0 04/05/2019   Lab Results  Component Value Date   CHOLHDL 3 04/05/2019   Lab Results   Component Value Date   HGBA1C 5.8 (A) 06/28/2020      Assessment & Plan:   #1 history of syncope.  He has history of heart block.  No recent syncopal episode.  Followed closely by cardiology.  Recent decision not to pursue permanent pacemaker.  #2 history of atrial fibrillation.  Patient on Eliquis but not taking appropriately.  Has only been taking once daily and we have emphasized importance of taking twice daily.  Appears to be in sinus rhythm at this time  #3 recent mild hypokalemia.  Recheck basic metabolic panel.  #4 history of mild hyperglycemia.  Strong family history of type 2 diabetes.  Check A1c  #5 balance problems.  Moderate risk for falls.  Increased concern with Eliquis.  We discussed outpatient physical therapy training and he is interested.  This will be set up  #6 hypertrophic cardiomyopathy by recent cardiac MRI   No orders of the defined types were placed in this encounter.   Follow-up: Return in about 2 months (around 07/15/2021).    Evelena Peat, MD

## 2021-05-17 ENCOUNTER — Other Ambulatory Visit: Payer: PPO

## 2021-05-23 ENCOUNTER — Telehealth: Payer: Self-pay | Admitting: Pharmacist

## 2021-05-23 NOTE — Chronic Care Management (AMB) (Signed)
? ? ?Chronic Care Management ?Pharmacy Assistant  ? ?Name: Drew Smith  MRN: [id number] DOB: 1940/07/29 ? ?Reason for Encounter: Disease State / Hypertension Assessment Call ?  ?Conditions to be addressed/monitored: ?HTN ? ?Recent office visits:  ?05/15/2021 [name] MD - Patient was seen for Hypokalemia and additional issues. Referral to physical therapy. Discontinued Klor-Con M. Follow up in 2 months.  ? ?04/23/2021 Betty Martinique MD - Patient was seen for jaw pain and additional issues. No medication changes. Follow up in 4 weeks  ? ?Recent consult visits:  ?05/04/2021 Cristopher Peru MD (cardiology) - Patient was seen for Paroxysmal atrial fibrillation and additional issues. No medication changes. Follow up in 3 months.  ? ?05/01/2021 Sunit Tolia DO (cardiology) - Patient was seen for High degree atrioventricular block and additional issues. No medication changes. Follow up in 4 weeks.  ? ?04/18/2021 Ellouise Newer MD (neurology) - Patient was seen for loss of consciousness. No medication changes. Follow up in 3 months.  ? ?04/04/2021 Darleen Crocker (opthamology) - Patient was seen for age related nuclear cataract left eye. No other chart note.  ? ?04/02/2021 Marrion Coy (chiropractor) - Patient was seen for Other intervertebral disc degeneration, lumbar region and additional issues. No other chart notes.  ? ?03/27/2021 Adrian Prows (cardiology) - Patient was seen for essential hypertension and additional issues. No other chart notes.  ? ?03/27/2021 Marrion Coy (chiropractor) - Patient was seen for Other intervertebral disc degeneration, lumbar region and additional issues. No other chart notes. ? ?03/15/2021 Sunit Tolia DO (cardiology) - Patient was seen for loss of consciousness and additional issues. Discontinued Efudex and Kenalog. Follow up in 6 weeks.  ? ?Hospital visits: ?Admitted to [facility] on 05/04/2021 due to Syncope and additional issues. Discharge date was 05/07/2021.    ?New?Medications Started at [facility] Discharge:?? ?apixaban Arne Cleveland) ?potassium chloride SA (KLOR-CON M) ?Medication Changes at Hospital Discharge: ?No medication changes ?Medications Discontinued at Hospital Discharge: ?acyclovir 400 MG tablet (ZOVIRAX) ?ibuprofen 800 MG tablet (ADVIL) ?Medications that remain the same after Hospital Discharge:??  ?-All other medications will remain the same.   ? ?Medications: ?Outpatient Encounter Medications as of 05/23/2021  ?Medication Sig  ? amLODipine (NORVASC) 5 MG tablet Take 1 tablet (5 mg total) by mouth daily.  ? apixaban (ELIQUIS) 5 MG TABS tablet Take 1 tablet (5 mg total) by mouth 2 (two) times daily.  ? azelastine (ASTELIN) 0.1 % nasal spray Place 1 spray into both nostrils 2 (two) times daily. Use in each nostril as directed  ? ciclopirox (LOPROX) 0.77 % cream APPLY TO AFFECTED AREA(S) TWO TIMES A DAY AS DIRECTED (Patient taking differently: Apply 1 application. topically 2 (two) times daily.)  ? desonide (DESOWEN) 0.05 % cream Apply 1 application. topically 2 (two) times daily.  ? gabapentin (NEURONTIN) 100 MG capsule Take one to two capsules by mouth at night as needed for back pain.  ? hydrochlorothiazide (MICROZIDE) 12.5 MG capsule Take 1 capsule (12.5 mg total) by mouth daily.  ? levETIRAcetam (KEPPRA) 500 MG tablet Take 1 tablet (500 mg total) by mouth 2 (two) times daily.  ? levocetirizine (XYZAL) 5 MG tablet TAKE ONE TABLET BY MOUTH EVERY EVENING  ? Multiple Vitamins-Minerals (CENTRUM SILVER 50+MEN PO) Take 1 tablet by mouth daily as needed.  ? omeprazole (PRILOSEC) 20 MG capsule TAKE ONE CAPSULE BY MOUTH TWICE A DAY (Patient taking differently: 20 mg daily.)  ? Oxymetazoline HCl (NASAL SPRAY) 0.05 % SOLN Place into the nose.  ? traMADol Veatrice Bourbon)  50 MG tablet Take 50 mg by mouth every 6 (six) hours as needed.  ? tretinoin (RETIN-A) 0.05 % cream   ? VITAMIN D, CHOLECALCIFEROL, PO Take 1 tablet by mouth every other day. 5000 units every other day  ? zinc  gluconate 50 MG tablet Take 50 mg by mouth daily as needed.  ? ?No facility-administered encounter medications on file as of 05/23/2021.  ?Fill History: ?ELIQUIS 5 MG TABLET 05/07/2021 30  ? ?AMLODIPINE 5 MG TABLET 02/27/2021 90  ? ?HYDROCHLOROTHIAZIDE 12.5 MG CAPSULE 02/27/2021 90  ? ?levETIRAcetam (KEPPRA) immediate release tablet 04/20/2021 90  ? ?omeprazole (PRILOSEC) capsule 01/23/2021 90  ? ?Reviewed chart prior to disease state call. Spoke with patient regarding BP ? ?Recent Office Vitals: ?BP Readings from Last 3 Encounters:  ?05/15/21 130/60  ?05/07/21 124/70  ?05/04/21 (!) 143/78  ? ?Pulse Readings from Last 3 Encounters:  ?05/15/21 80  ?05/07/21 63  ?05/04/21 76  ?  ?Wt Readings from Last 3 Encounters:  ?05/15/21 164 lb 8 oz (74.6 kg)  ?05/04/21 165 lb (74.8 kg)  ?05/04/21 165 lb (74.8 kg)  ?  ? ?Kidney Function ?Lab Results  ?Component Value Date/Time  ? CREATININE 1.07 05/15/2021 12:02 PM  ? CREATININE 1.28 (H) 05/06/2021 12:08 AM  ? GFR 64.99 05/15/2021 12:02 PM  ? GFRNONAA 56 (L) 05/06/2021 12:08 AM  ? GFRAA 124 10/28/2007 12:00 AM  ? ? ? ?  Latest Ref Rng & Units 05/15/2021  ? 12:02 PM 05/06/2021  ? 12:08 AM 05/05/2021  ?  2:39 AM  ?BMP  ?Glucose 70 - 99 mg/dL 146   136   93    ?BUN 6 - 23 mg/dL '17   17   17    '$ ?Creatinine 0.40 - 1.50 mg/dL 1.07   1.28   1.00    ?Sodium 135 - 145 mEq/L 135   133   138    ?Potassium 3.5 - 5.1 mEq/L 4.1   3.3   4.3    ?Chloride 96 - 112 mEq/L 101   101   103    ?CO2 19 - 32 mEq/L '27   25   27    '$ ?Calcium 8.4 - 10.5 mg/dL 9.5   8.9   9.2    ? ? ?Current antihypertensive regimen:  ?Amlodipine 5 mg daily ? ?How often are you checking your Blood Pressure? Patient states he is not checking at home, he states he is at the doctors so much that he goes by those readings. He states he will got to the pharmacy and check it from time to time.  ? ?Current home BP readings: Patient is not checking at home.  ? ?What recent interventions/DTPs have been made by any provider to improve  Blood Pressure control since last CPP Visit: No recent interventions.  ? ?Any recent hospitalizations or ED visits since last visit with CPP? Patient was inpatient 05/04/2021 to 05/07/2021 ? ?What diet changes have been made to improve Blood Pressure Control?  ?Patient follows no specific diet ?Breakfast - patient will  have a banana and cereal or eggs, bacon and toast ?Lunch - patient will have leftovers or a light meal ?Dinner - patient will have a meal with a meat and vegetable.  ? ?What exercise is being done to improve your Blood Pressure Control?  ?Patient states he does stretching, balance and strength exercises daily and will work on his golf swing. ? ?Adherence Review: ?Is the patient currently on ACE/ARB medication? No ?Does the patient  have >5 day gap between last estimated fill dates? No ? ?Care Gaps: ?AWV - previous message sent to Ramond Craver. ?Last BP - 130/60 on 05/15/2021 ?Covid booster - never done ?Shingrix - never done ?Pneumonia vaccine - over due ?  ?Star Rating Drugs: ?None ? ?Gennie Alma CMA  ?Clinical Pharmacist Assistant ?[phone] ? ?

## 2021-05-29 ENCOUNTER — Encounter: Payer: Self-pay | Admitting: Physical Therapy

## 2021-05-29 ENCOUNTER — Ambulatory Visit: Payer: PPO | Attending: Family Medicine | Admitting: Physical Therapy

## 2021-05-29 VITALS — BP 138/75 | HR 62

## 2021-05-29 DIAGNOSIS — Z9181 History of falling: Secondary | ICD-10-CM | POA: Diagnosis not present

## 2021-05-29 DIAGNOSIS — R2681 Unsteadiness on feet: Secondary | ICD-10-CM | POA: Diagnosis not present

## 2021-05-29 DIAGNOSIS — R42 Dizziness and giddiness: Secondary | ICD-10-CM | POA: Diagnosis not present

## 2021-05-29 DIAGNOSIS — R2689 Other abnormalities of gait and mobility: Secondary | ICD-10-CM | POA: Diagnosis not present

## 2021-05-29 NOTE — Therapy (Signed)
[place] ?[facility] ?[address], [address] ?[place], [place], [address] ?Phone: [phone]   Fax:  [phone] ? ?Physical Therapy Evaluation ? ?Patient Details  ?Name: Drew Smith ?MRN: [id number] ?Date of Birth: 1940-11-10 ?Referring Provider (PT): Burchette, Alinda Sierras, MD ? ? ?Encounter Date: 05/29/2021 ? ? PT End of Session - 05/29/21 1446   ? ? Visit Number 1   ? Number of Visits 7   ? Date for PT Re-Evaluation 07/10/21   ? Authorization Type [employer]   ? PT Start Time 7654   ? PT Stop Time 6503   ? PT Time Calculation (min) 39 min   ? Equipment Utilized During Treatment Gait belt   ? Activity Tolerance Patient tolerated treatment well   ? Behavior During Therapy [facility] for tasks assessed/performed   ? ?  ?  ? ?  ? ? ?Past Medical History:  ?Diagnosis Date  ? Arthritis   ? Asthma   ? Cancer [facility])   ? prostate  ? GERD (gastroesophageal reflux disease)   ? Guillain-Barre syndrome ([place]) 02/25/1998  ? Hyperlipidemia   ? Hypertension   ? Seizures ([place])   ? ? ?Past Surgical History:  ?Procedure Laterality Date  ? CATARACT EXTRACTION    ? EYE SURGERY  02/26/1943  ? 81 years old  ? HERNIA REPAIR  02/25/1993  ? NOSE SURGERY  02/26/1988  ? PROSTATE SURGERY  02/25/1998  ? seed inplants  ? SHOULDER SURGERY  02/25/2006  ? ? ?Vitals:  ? 05/29/21 1410  ?BP: 138/75  ?Pulse: 62  ?SpO2: 96%  ? ? ? ? Subjective Assessment - 05/29/21 1405   ? ? Subjective Patient reports that he would like to improve his balance but denies recent falls. Notes trouble with balance since seizure last fall. Feels like it has to do with the fact that his feet are chronically swollen and d/t having cataract surgery recently. Reports some dizziness since September 2022, worse with quick movements, bending over. Was hospitalized last month d/t being unstable/feeling like he was going to pass out. Doesn?t use AD except hockey stick for longer distances.   ? Pertinent History asthma, prostate CA, GBS 2000, HLD, HTN,  seizures, history of complete heart block   ? Limitations Lifting;Standing;Walking;House hold activities   ? Diagnostic tests 03.13.23 cardiac MRI: hypertrophic cardiomyopathy   ? Patient Stated Goals improve balance   ? Currently in Pain? No/denies   ? ?  ?  ? ?  ? ? ? ? ? [facility] PT Assessment - 05/29/21 1412   ? ?  ? Assessment  ? Medical Diagnosis At moderate risk for fall   ? Referring Provider (PT) [name], MD   ? Onset Date/Surgical Date 05/04/21   ? Next MD Visit not scheduled   ? Prior Therapy yes- [facility]   ?  ? Precautions  ? Precautions Fall   seizures- last one in January, including falling and having convulsions; reports many head injuries from playing ice hockey when he was younger  ?  ? Balance Screen  ? Has the patient fallen in the past 6 months Yes   ? How many times? 2   ? Has the patient had a decrease in activity level because of a fear of falling?  No   ? Is the patient reluctant to leave their home because of a fear of falling?  No   ?  ? Home Environment  ? Living Environment Private residence   ?  Living Arrangements Alone   ? Type of Home House   ? Home Access Stairs to enter   ? Entrance Stairs-Number of Steps 1-2   ? Entrance Stairs-Rails None   ? Home Layout One level   ? Home Equipment --   walking stick/hockey stick  ?  ? Prior Function  ? Level of Independence Independent   son helps with driving and climbing ladders  ? Vocation Retired   ? Leisure golf, dancing   ?  ? Cognition  ? Overall Cognitive Status Within Functional Limits for tasks assessed   ?  ? Sensation  ? Light Touch Impaired by gross assessment   c/o B UEs/LEs N/T  ?  ? Posture/Postural Control  ? Posture/Postural Control Postural limitations   ? Postural Limitations Rounded Shoulders;Forward head   ?  ? ROM / Strength  ? AROM / PROM / Strength AROM;Strength   ?  ? AROM  ? AROM Assessment Site Ankle   ? Right/Left Ankle Right;Left   ? Right Ankle Dorsiflexion 9   ? Left Ankle Dorsiflexion 5   ?  ? Strength  ?  Overall Strength Comments in sitting   ? Strength Assessment Site Hip;Knee;Ankle   ? Right/Left Hip Right;Left   ? Right Hip Flexion 4+/5   ? Right Hip ABduction 4+/5   ? Right Hip ADduction 4/5   ? Left Hip Flexion 4+/5   ? Left Hip ABduction 4+/5   ? Left Hip ADduction 4/5   ? Right/Left Knee Right;Left   ? Right Knee Flexion 4/5   ? Right Knee Extension 4+/5   ? Left Knee Flexion 4+/5   ? Left Knee Extension 4/5   ? Right/Left Ankle Right;Left   ? Right Ankle Dorsiflexion 4+/5   ? Right Ankle Plantar Flexion 4+/5   ? Left Ankle Dorsiflexion 4+/5   ? Left Ankle Plantar Flexion 4+/5   ?  ? Ambulation/Gait  ? Assistive device None   ? Gait Pattern Step-to pattern;Step-through pattern;Poor foot clearance - left;Poor foot clearance - right   ? Ambulation Surface Level;Indoor   ? Gait velocity decreased   ?  ? Standardized Balance Assessment  ? Standardized Balance Assessment Five Times Sit to Stand;Dynamic Gait Index   ? Five times sit to stand comments  12.49   without UEs  ?  ? Dynamic Gait Index  ? Level Surface Normal   ? Change in Gait Speed Normal   ? Gait with Horizontal Head Turns Mild Impairment   ? Gait with Vertical Head Turns Mild Impairment   ? Gait and Pivot Turn Mild Impairment   ? Step Over Obstacle Normal   ? Step Around Obstacles Moderate Impairment   c/o mild dizziness  ? Steps Normal   ? Total Score 19   ? ?  ?  ? ?  ? ? ? ? ? ? ? ? ? ? ? ? ? ?Objective measurements completed on examination: See above findings.  ? ? ? ? ? ? ? ? ? ? ? ? ? ? PT Education - 05/29/21 1446   ? ? Education Details prognosis, POC, HEP   ? Person(s) Educated Patient   ? Methods Explanation;Demonstration;Tactile cues;Verbal cues;Handout   ? Comprehension Verbalized understanding   ? ?  ?  ? ?  ? ? ? PT Short Term Goals - 05/29/21 1455   ? ?  ? PT SHORT TERM GOAL #1  ? Title Patient to be independent with initial HEP.   ?  Time 3   ? Period Weeks   ? Status New   ? Target Date 06/19/21   ? ?  ?  ? ?  ? ? ? ? PT Long Term  Goals - 05/29/21 1455   ? ?  ? PT LONG TERM GOAL #1  ? Title Patient to be independent with advanced HEP.   ? Time 6   ? Period Weeks   ? Status New   ? Target Date 07/10/21   ?  ? PT LONG TERM GOAL #2  ? Title Patient to score at least 22/24 on DGI in order to decrease risk of falls.   ? Time 6   ? Period Weeks   ? Status New   ? Target Date 07/10/21   ?  ? PT LONG TERM GOAL #3  ? Title Patient to improve step length and foot clearance with gait by 50%.   ? Time 6   ? Period Weeks   ? Status New   ? Target Date 07/10/21   ?  ? PT LONG TERM GOAL #4  ? Title Patient to report 50% improvement in dizziness.   ? Time 6   ? Period Weeks   ? Status New   ? Target Date 07/10/21   ?  ? PT LONG TERM GOAL #5  ? Title Patient to report 60% improvement in balance confidence.   ? Time 6   ? Period Weeks   ? Status New   ? Target Date 07/10/21   ? ?  ?  ? ?  ? ? ? ? ? ? ? ? ? Plan - 05/29/21 1447   ? ? Clinical Impression Statement Patient is an 81 y/o M presenting to [facility] with c/o imbalance and dizziness s/p seizure in September 2022. Of note, patient with hospitalization 05/04/21-05/07/21 for clinically undetermined syncope. Currently ambulates without AD but uses hockey stick for stability when walking longer distances. Patient is active with golf and shag dancing and would like to work on balance to improve ability and safety to perform these activities. Patient today presenting with rounded shoulders and FHP, limited B ankle dorsiflexion AROM, decreased hip adductor, quad, HS strength, gait deviations, decreased balance confidence, and dizziness. Patient with increased imbalance today with descending stairs and turning activities. Patient was educated on gentle stretching and balance HEP and reported understanding. Would benefit from skilled PT services 1x/week for 6 weeks to address aforementioned impairments in order to optimize level of function.   ? Personal Factors and Comorbidities Fitness;Age;Comorbidity 3+;Time since  onset of injury/illness/exacerbation;Past/Current Experience   ? Comorbidities asthma, prostate CA, GBS 2000, HLD, HTN, seizures, history of complete heart block   ? Examination-Activity Limitations Locomotion Leve

## 2021-05-29 NOTE — Patient Instructions (Signed)
Access Code: [email or web address] ?URL: [email or web address] ?Date: 05/29/2021 ?Prepared by: [facility] ? ?Exercises ?- Gastroc Stretch on Wall  - 1 x daily - 5 x weekly - 2 sets - 30 sec hold ?- Standing Toe Taps  - 1 x daily - 5 x weekly - 2 sets - 10 reps ?- 180 Degree Pivot Turn with Single Point Cane  - 1 x daily - 5 x weekly - 2 sets - 10 reps ?perform in a doorway and turn to look at a target on the wall ?- Sidelying Hip Adduction  - 1 x daily - 5 x weekly - 2 sets - 10 reps ?

## 2021-05-31 ENCOUNTER — Telehealth: Payer: Self-pay | Admitting: Family Medicine

## 2021-05-31 NOTE — Telephone Encounter (Signed)
Left message for patient to call back and schedule Medicare Annual Wellness Visit (AWV) either virtually or in office. Left  my Herbie Drape number [phone] ? ? ?Last AWV 06/01/20 ? please schedule at anytime with [facility] Nurse Health Advisor 1 or 2 ? ? ? ?

## 2021-06-05 ENCOUNTER — Encounter: Payer: Self-pay | Admitting: Cardiology

## 2021-06-05 ENCOUNTER — Ambulatory Visit: Payer: PPO | Admitting: Cardiology

## 2021-06-05 ENCOUNTER — Ambulatory Visit: Payer: PPO | Admitting: Physical Therapy

## 2021-06-05 ENCOUNTER — Encounter: Payer: Self-pay | Admitting: Physical Therapy

## 2021-06-05 VITALS — BP 135/80 | HR 65 | Temp 98.5°F | Resp 16 | Ht 68.0 in | Wt 164.6 lb

## 2021-06-05 DIAGNOSIS — I459 Conduction disorder, unspecified: Secondary | ICD-10-CM | POA: Diagnosis not present

## 2021-06-05 DIAGNOSIS — R2689 Other abnormalities of gait and mobility: Secondary | ICD-10-CM

## 2021-06-05 DIAGNOSIS — R2681 Unsteadiness on feet: Secondary | ICD-10-CM

## 2021-06-05 DIAGNOSIS — E782 Mixed hyperlipidemia: Secondary | ICD-10-CM | POA: Diagnosis not present

## 2021-06-05 DIAGNOSIS — R42 Dizziness and giddiness: Secondary | ICD-10-CM

## 2021-06-05 DIAGNOSIS — I422 Other hypertrophic cardiomyopathy: Secondary | ICD-10-CM

## 2021-06-05 DIAGNOSIS — Z7901 Long term (current) use of anticoagulants: Secondary | ICD-10-CM | POA: Diagnosis not present

## 2021-06-05 DIAGNOSIS — I480 Paroxysmal atrial fibrillation: Secondary | ICD-10-CM

## 2021-06-05 NOTE — Progress Notes (Signed)
? ?Date:  06/05/2021  ? ?ID:  DONTAY HARM, DOB [birth year], MRN [id number] ? ?PCP:  [name], MD  ?Cardiologist:  Rex Kras, DO, [facility] (established care 03/15/2021) ? ?Date: 06/05/21 ?Last Office Visit: 05/01/2021 ? ?Chief Complaint  ?Patient presents with  ? Follow-up  ?  Hospital follow-up.  ? ? ?HPI  ?Drew Smith is a 81 y.o. Caucasian male whose past medical history and cardiovascular risk factors include: History of syncope, conduction disorder, paroxysmal atrial fibrillation, hypertrophic cardiomyopathy (cardiac MRI), HTN, HLD, prostate cancer. ? ?He is referred to the office at the request of [name], MD for evaluation of loss of consciousness. ? ?Patient has had episodes of loss of consciousness in September 2022 and again in January 2023 and subsequently referred to cardiology for work-up of syncope.  He underwent echocardiogram, stress test, carotid duplex, and a 14-day extended Holter monitor.  Results noted below for further reference.  However in summary he has preserved LVEF, left ventricular hypertrophy on echocardiogram concerning for HCM, exercise treadmill stress test overall low risk. 14-day extended Holter monitor noted episodes of nocturnal pauses and one episode presumably in the awakening hours consistent with pause and complete heart block along with intermittent paroxysmal A-fib.  Given his symptoms of syncope this shared decision was to prioritize conduction disease evaluation prior to possible HCM. ? ?Given his episodes of syncope in December 2022/January 2023 and evidence of transient complete heart block he was referred to cardiac electrophysiology for possible pacemaker evaluation.  He was getting consider pacemaker implantation and prior to making his decision he was hospitalized in March 2023 due to near syncope.  During the hospitalization he was evaluated by cardiology and electrophysiology who recommended cardiac MRI given the concern for possible  HCM.  Patient did undergo cardiac MRI results noted below but in summary consistent with asymmetrical septal hypertrophy meeting the criteria for HCM.  Cardiac electrophysiology suspected that his episodes of pauses /transient complete heart block likely due to vagal mediated events and did not recommend pacemaker. ? ?Since discharge patient has done well from a cardiovascular standpoint.  He has not experienced lightheadedness/dizziness/near syncope or syncopal events.  He denies angina pectoris.  He remains on Keppra and has an upcoming office visit with neurology. ? ?He is on oral anticoagulation given his CHA2DS2-VASc or and episodes of paroxysmal atrial fibrillation.  He does not endorse any evidence of bleeding.  He was recommended to undergo sleep study but this has not been arranged postdischarge.  He is requesting a referral. ? ?FUNCTIONAL STATUS: ?Golfing 3 times a week.   ? ?ALLERGIES: ?No Known Allergies ? ?MEDICATION LIST PRIOR TO VISIT: ?Current Meds  ?Medication Sig  ? amLODipine (NORVASC) 5 MG tablet Take 1 tablet (5 mg total) by mouth daily.  ? apixaban (ELIQUIS) 5 MG TABS tablet Take 1 tablet (5 mg total) by mouth 2 (two) times daily.  ? desonide (DESOWEN) 0.05 % cream Apply 1 application. topically 2 (two) times daily.  ? gabapentin (NEURONTIN) 100 MG capsule Take one to two capsules by mouth at night as needed for back pain.  ? hydrochlorothiazide (MICROZIDE) 12.5 MG capsule Take 1 capsule (12.5 mg total) by mouth daily.  ? ketoconazole (NIZORAL) 2 % cream SMARTSIG:1 Application Topical 1 to 2 Times Daily  ? levETIRAcetam (KEPPRA) 500 MG tablet Take 1 tablet (500 mg total) by mouth 2 (two) times daily.  ? levocetirizine (XYZAL) 5 MG tablet TAKE ONE TABLET BY MOUTH EVERY EVENING  ? Multiple  Vitamins-Minerals (CENTRUM SILVER 50+MEN PO) Take 1 tablet by mouth daily as needed.  ? omeprazole (PRILOSEC) 20 MG capsule TAKE ONE CAPSULE BY MOUTH TWICE A DAY (Patient taking differently: 20 mg daily.)  ?  Oxymetazoline HCl (NASAL SPRAY) 0.05 % SOLN Place into the nose.  ? traMADol (ULTRAM) 50 MG tablet Take 50 mg by mouth every 6 (six) hours as needed.  ? VITAMIN D, CHOLECALCIFEROL, PO Take 1 tablet by mouth every other day. 5000 units every other day  ? zinc gluconate 50 MG tablet Take 50 mg by mouth daily as needed.  ?  ? ?PAST MEDICAL HISTORY: ?Past Medical History:  ?Diagnosis Date  ? Arthritis   ? Asthma   ? Cancer [facility])   ? prostate  ? GERD (gastroesophageal reflux disease)   ? Guillain-Barre syndrome ([place]) 02/25/1998  ? Hyperlipidemia   ? Hypertension   ? Seizures ([place])   ? ? ?PAST SURGICAL HISTORY: ?Past Surgical History:  ?Procedure Laterality Date  ? CATARACT EXTRACTION    ? EYE SURGERY  02/26/1943  ? 81 years old  ? HERNIA REPAIR  02/25/1993  ? NOSE SURGERY  02/26/1988  ? PROSTATE SURGERY  02/25/1998  ? seed inplants  ? SHOULDER SURGERY  02/25/2006  ? ? ?FAMILY HISTORY: ?The patient family history includes Alcohol abuse in his father; Diabetes in his brother; Hyperlipidemia in his brother; Stroke in his brother. ? ?SOCIAL HISTORY:  ?The patient  reports that he has never smoked. He has never used smokeless tobacco. He reports current alcohol use of about 7.0 standard drinks per week. He reports that he does not use drugs. ? ?REVIEW OF SYSTEMS: ?Review of Systems  ?Constitutional: Positive for malaise/fatigue (chronic and stable.).  ?Cardiovascular:  Negative for chest pain, dyspnea on exertion, leg swelling, near-syncope, orthopnea, palpitations, paroxysmal nocturnal dyspnea and syncope.  ?Respiratory:  Negative for shortness of breath.   ? ?PHYSICAL EXAM: ? ?  06/05/2021  ?  1:53 PM 05/29/2021  ?  2:10 PM 05/15/2021  ? 11:24 AM  ?Vitals with BMI  ?Height '5\' 8"'$   '5\' 8"'$   ?Weight 164 lbs 10 oz  164 lbs 8 oz  ?BMI 25.03  25.02  ?Systolic 253 664 403  ?Diastolic 80 75 60  ?Pulse 65 62 80  ? ?CONSTITUTIONAL: Well-developed and well-nourished. No acute distress.  ?SKIN: Skin is warm and dry. No rash noted. No  cyanosis. No pallor. No jaundice ?HEAD: Normocephalic and atraumatic.  ?EYES: No scleral icterus ?MOUTH/THROAT: Moist oral membranes.  ?NECK: No JVD present. No thyromegaly noted. No carotid bruits  ?LYMPHATIC: No visible cervical adenopathy.  ?CHEST Normal respiratory effort. No intercostal retractions  ?LUNGS: Clear to auscultation bilaterally.  No stridor. No wheezes. No rales.  ?CARDIOVASCULAR: Regular rate and rhythm, positive S1-S2, no murmurs rubs or gallops appreciated. ?ABDOMINAL: Soft, nontender, nondistended, positive bowel sounds in all 4 quadrants, no apparent ascites.  ?EXTREMITIES: No peripheral edema, warm to touch, 2+ bilateral DP and PT pulses ?HEMATOLOGIC: No significant bruising ?NEUROLOGIC: Oriented to person, place, and time. Nonfocal. Normal muscle tone.  ?PSYCHIATRIC: Normal mood and affect. Normal behavior. Cooperative ? ?CARDIAC DATABASE: ?EKG: ?03/15/2021: Normal sinus rhythm, 64 bpm, consider old lateral infarct, without underlying ischemia or injury pattern. ? ?Echocardiogram: ?03/27/2021: ?Study Quality: Technically difficult study. ?Normal LV systolic function with visual EF 60-65%. Left ventricle cavity is normal in size. Moderate left ventricular hypertrophy, prominent basal septum (IVSd1.67cm and LVPWd 1.23cm). Normal global wall motion. Normal diastolic filling pattern, normal LAP.  ?Trace aortic regurgitation. ?  Trace tricuspid regurgitation. No evidence of pulmonary hypertension. ?No prior study for comparison. ?  ?Stress Testing: ?Exercise treadmill stress test 04/13/2021: ?Exercise treadmill stress test performed using Bruce protocol. Patient reached 4.6 METS, and 87% of age predicted maximum heart rate. Exercise capacity was low. No chest pain reported. Normal heart rate response with hypertensive exercise response. Peak BP 220/80 mmHg. Stress EKG shows no ischemic changes. ?Low risk study. ? ?Heart Catheterization: ?None ? ?Carotid artery duplex 03/27/2021: ?Duplex suggests  stenosis in the right internal carotid artery (1-15%). ?Duplex suggests stenosis in the left internal carotid artery (1-15%). ?Mild diffuse homogeneous plaque noted in bilateral carotid arteries. Left carotid exhibits

## 2021-06-05 NOTE — Therapy (Signed)
[place] [facility] ?[address], [address] ?[place], [place], [address] ?Phone: [phone]   Fax:  [phone] ? ?Physical Therapy Treatment ? ?Patient Details  ?Name: Drew Smith ?MRN: [id number] ?Date of Birth: 1940/04/23 ?Referring Provider (PT): Burchette, Alinda Sierras, MD ? ? ?Encounter Date: 06/05/2021 ? ? PT End of Session - 06/05/21 1628   ? ? Visit Number 2   ? Number of Visits 7   ? Date for PT Re-Evaluation 07/10/21   ? Authorization Type [employer]   ? PT Start Time 1532   ? PT Stop Time 1620   ? PT Time Calculation (min) 48 min   ? Activity Tolerance Patient tolerated treatment well   ? Behavior During Therapy [facility] for tasks assessed/performed   ? ?  ?  ? ?  ? ? ?Past Medical History:  ?Diagnosis Date  ? Arthritis   ? Asthma   ? Cancer [facility])   ? prostate  ? GERD (gastroesophageal reflux disease)   ? Guillain-Barre syndrome ([place]) 02/25/1998  ? Hyperlipidemia   ? Hypertension   ? Seizures ([place])   ? ? ?Past Surgical History:  ?Procedure Laterality Date  ? CATARACT EXTRACTION    ? EYE SURGERY  02/26/1943  ? 81 years old  ? HERNIA REPAIR  02/25/1993  ? NOSE SURGERY  02/26/1988  ? PROSTATE SURGERY  02/25/1998  ? seed inplants  ? SHOULDER SURGERY  02/25/2006  ? ? ?There were no vitals filed for this visit. ? ? Subjective Assessment - 06/05/21 1533   ? ? Subjective Asking about BP as this is his 3rd appointment today and he had differing readings.   ? Pertinent History asthma, prostate CA, GBS 2000, HLD, HTN, seizures, history of complete heart block   ? Diagnostic tests 03.13.23 cardiac MRI: hypertrophic cardiomyopathy   ? Patient Stated Goals improve balance   ? Currently in Pain? No/denies   ? ?  ?  ? ?  ? ? ? ? ? ? ? ? ? ? Vestibular Assessment - 06/05/21 0001   ? ?  ? Symptom Behavior  ? Subjective history of current problem Denies recent trauma however reports multiple episodes of head trauma from hockey when he was younger. reports dizziness and imbalance ongoing since his  2nd seizure which occured in January 2022. Denies recent illness/infection, vision or hearing changes, migraines.   ? Type of Dizziness  --   woozy  ? Frequency of Dizziness multiple times a day   ? Duration of Dizziness seconds   ? Symptom Nature Motion provoked   ? Aggravating Factors Supine to sit;Sit to stand;Turning body quickly;Turning head quickly   ?  ? Oculomotor Exam  ? Oculomotor Alignment Normal   ? Ocular ROM intact   ? Spontaneous Absent   ? Gaze-induced  Absent   ? Smooth Pursuits Intact   ? Saccades Intact   ?  ? Oculomotor Exam-Fixation Suppressed   ? Left Head Impulse slightly positive   ? Right Head Impulse negative   ?  ? Vestibulo-Ocular Reflex  ? VOR 1 Head Only (x 1 viewing) c/o wooziness  with horizontal VOR, less symptoms in vertical direction but more trouble maintaining gaze stability   ? VOR Cancellation Normal   c/o dizziness  ?  ? Positional Testing  ? Dix-Hallpike Dix-Hallpike Right;Dix-Hallpike Left   ? Horizontal Canal Testing Horizontal Canal Right;Horizontal Canal Left   ?  ? Dix-Hallpike Right  ? Dix-Hallpike Right Duration 0   ?  Dix-Hallpike Right Symptoms No nystagmus   ?  ? Dix-Hallpike Left  ? Dix-Hallpike Left Duration 0   ? Dix-Hallpike Left Symptoms No nystagmus   ?  ? Horizontal Canal Right  ? Horizontal Canal Right Duration 0   ? Horizontal Canal Right Symptoms Normal   ?  ? Horizontal Canal Left  ? Horizontal Canal Left Duration 0   ? Horizontal Canal Left Symptoms Normal   ?  ? Orthostatics  ? BP supine (x 5 minutes) 139/72   ? HR supine (x 5 minutes) 60   ? BP sitting 131/78   dizziness  ? HR sitting 69   ? BP standing (after 1 minute) 147/84   dizziness  ? HR standing (after 1 minute) 67   ? BP standing (after 3 minutes) 150/76   ? HR standing (after 3 minutes) 66   ? Orthostatics Comment negative, however patient did report dizziness with transitions   ? ?  ?  ? ?  ? ? ? ? ? ? ? ? ? ? ? ? ? ? ? ? ? ? ? ? PT Education - 06/05/21 1627   ? ? Education Details review of  previously administered HEP and initiation of HEP for VOR; edu on etioloy and tx of vestibular hypofunction   ? Person(s) Educated Patient   ? Methods Explanation;Demonstration;Tactile cues;Verbal cues;Handout   ? Comprehension Returned demonstration;Verbalized understanding   ? ?  ?  ? ?  ? ? ? PT Short Term Goals - 06/05/21 1630   ? ?  ? PT SHORT TERM GOAL #1  ? Title Patient to be independent with initial HEP.   ? Time 3   ? Period Weeks   ? Status On-going   ? Target Date 06/19/21   ? ?  ?  ? ?  ? ? ? ? PT Long Term Goals - 06/05/21 1630   ? ?  ? PT LONG TERM GOAL #1  ? Title Patient to be independent with advanced HEP.   ? Time 6   ? Period Weeks   ? Status On-going   ? Target Date 07/10/21   ?  ? PT LONG TERM GOAL #2  ? Title Patient to score at least 22/24 on DGI in order to decrease risk of falls.   ? Time 6   ? Period Weeks   ? Status On-going   ? Target Date 07/10/21   ?  ? PT LONG TERM GOAL #3  ? Title Patient to improve step length and foot clearance with gait by 50%.   ? Time 6   ? Period Weeks   ? Status On-going   ? Target Date 07/10/21   ?  ? PT LONG TERM GOAL #4  ? Title Patient to report 50% improvement in dizziness.   ? Time 6   ? Period Weeks   ? Status On-going   ? Target Date 07/10/21   ?  ? PT LONG TERM GOAL #5  ? Title Patient to report 60% improvement in balance confidence.   ? Time 6   ? Period Weeks   ? Status On-going   ? Target Date 07/10/21   ? ?  ?  ? ?  ? ? ? ? ? ? ? ? Plan - 06/05/21 1628   ? ? Clinical Impression Statement Patient arrived to session with report of some irregularities in his BP at 2 other MD appointments today. Checked orthostatics, which were negative. Patient did however report  dizziness upon supine>sit and STS. Oculomotor assessment revealed slightly positive L HIT, dizziness with VOR and VOR cancellation. Positional testing was negative. Educated patient on vestibular hypofunction and initiated VOR and habituation exercises. Patient reported understanding and  without complaints at end of session   ? Comorbidities asthma, prostate CA, GBS 2000, HLD, HTN, seizures, history of complete heart block   ? PT Treatment/Interventions ADLs/Self Care Home Management;Canalith Repostioning;Cryotherapy;DME Instruction;Moist Heat;Gait training;Stair training;Functional mobility training;Therapeutic activities;Therapeutic exercise;Balance training;Neuromuscular re-education;Manual techniques;Patient/family education;Passive range of motion;Dry needling;Energy conservation;Vestibular;Taping   ? PT Next Visit Plan reassess HEP, work on SLS, turning, descending stairs without UE support   ? Consulted and Agree with Plan of Care Patient   ? ?  ?  ? ?  ? ? ?Patient will benefit from skilled therapeutic intervention in order to improve the following deficits and impairments:  Abnormal gait, Decreased range of motion, Difficulty walking, Dizziness, Decreased activity tolerance, Impaired flexibility, Improper body mechanics, Decreased balance, Decreased strength, Postural dysfunction ? ?Visit Diagnosis: ?Unsteadiness on feet ? ?Dizziness and giddiness ? ?Other abnormalities of gait and mobility ? ? ? ? ?Problem List ?Patient Active Problem List  ? Diagnosis Date Noted  ? Hypokalemia 05/06/2021  ? CHB (complete heart block) ([place]) 05/05/2021  ? Syncope 05/04/2021  ? Paroxysmal atrial fibrillation ([place]) 05/04/2021  ? History of seizures 05/04/2021  ? Malignant melanoma of left forearm ([place]) 10/19/2019  ? Upper respiratory tract infection 02/14/2017  ? Porokeratosis 09/07/2014  ? Metatarsal deformity 09/07/2014  ? Pain in lower limb 09/07/2014  ? Squamous cell carcinoma in situ of skin 10/15/2013  ? Hives 10/06/2012  ? History of Guillain-Barre syndrome 03/07/2011  ? Hypertension 11/28/2010  ? Dyslipidemia 11/28/2010  ? ERECTILE DYSFUNCTION 01/05/2008  ? ASTHMA 10/28/2007  ? GERD 10/28/2007  ? DIZZINESS 10/28/2007  ? COUGH 10/28/2007  ? FASTING HYPERGLYCEMIA 10/28/2007  ? PROSTATE CANCER, HX OF  10/28/2007  ? ? ?Janene Harvey, PT, DPT ?06/05/21 4:32 PM ? ? ?[place] [facility] ?[address], [address] ?[place], [place], [address] ?Phone: [phone]   Fax:  [phone]

## 2021-06-19 ENCOUNTER — Encounter: Payer: Self-pay | Admitting: Physical Therapy

## 2021-06-19 ENCOUNTER — Ambulatory Visit: Payer: PPO | Admitting: Physical Therapy

## 2021-06-19 VITALS — BP 128/72 | HR 63

## 2021-06-19 DIAGNOSIS — R2681 Unsteadiness on feet: Secondary | ICD-10-CM | POA: Diagnosis not present

## 2021-06-19 DIAGNOSIS — R42 Dizziness and giddiness: Secondary | ICD-10-CM

## 2021-06-19 NOTE — Therapy (Addendum)
[facility] [address], [place], [place], [address] Phone: [phone]   Fax:  [phone]  Physical Therapy Treatment  Patient Details  Name: Drew Smith MRN: [id number] Date of Birth: 08/04/1940 Referring Provider (PT): [name], MD   Progress Note Reporting Period 05/29/21 to 06/19/21  See note below for Objective Data and Assessment of Progress/Goals.      Encounter Date: 06/19/2021   PT End of Session - 06/19/21 1702     Visit Number 3    Number of Visits 7    Date for PT Re-Evaluation 07/10/21    Authorization Type [employer]    PT Start Time 1625   pt late   PT Stop Time 1700    PT Time Calculation (min) 35 min    Equipment Utilized During Treatment Gait belt    Activity Tolerance Patient tolerated treatment well    Behavior During Therapy WFL for tasks assessed/performed             Past Medical History:  Diagnosis Date   Arthritis    Asthma    Cancer ([place])    prostate   GERD (gastroesophageal reflux disease)    Guillain-Barre syndrome ([place]) 02/25/1998   Hyperlipidemia    Hypertension    Seizures ([place])     Past Surgical History:  Procedure Laterality Date   CATARACT EXTRACTION     EYE SURGERY  02/26/1943   81 years old   HERNIA REPAIR  02/25/1993   NOSE SURGERY  02/26/1988   PROSTATE SURGERY  02/25/1998   seed inplants   SHOULDER SURGERY  02/25/2006    Vitals:   06/19/21 1631  BP: 128/72  Pulse: 63  SpO2: 98%     Subjective Assessment - 06/19/21 1622     Subjective Same old, same old. Feet are feeling a little numb. Requesting BP check. Reports that he can't tell if dizziness is getting worse. Reports some "flashing light" sensation occasionally. Admits to noncompliance with HEP d/t being out of town.    Pertinent History asthma, prostate CA, GBS 2000, HLD, HTN, seizures, history of complete heart block    Diagnostic tests 03.13.23 cardiac MRI: hypertrophic  cardiomyopathy    Patient Stated Goals improve balance    Currently in Pain? Yes    Pain Location Throat   reports mild sore throat                              [facility] Adult PT Treatment/Exercise - 06/19/21 0001       Neuro Re-ed    Neuro Re-ed Details  R/L alternating step ups without UE support  x20, lateral and anterior step ups on foam 10x each, rolling ball under foot 10x CW/CCW without UE support, 1/2 turn + toe taps with CGA- slow speed             Vestibular Treatment/Exercise - 06/19/21 0001       Vestibular Treatment/Exercise   Habituation Exercises Nestor Lewandowsky    Gaze Exercises X1 Viewing Horizontal;Eye/Head Exercise Horizontal      Nestor Lewandowsky   Number of Reps  4    Symptom Description  c/o 3-4/10 dizziness from B sides      X1 Viewing Horizontal   Foot Position sitting, standing    Reps --   30" x2   Comments c/o "some floaters" no dizzines; c/o slight dizziness at quicker pace  Eye/Head Exercise Horizontal   Foot Position sitting, standing    Reps --   30"   Comments c/o mild dizziness; cues to slow speed to allow to maintain focus                    PT Education - 06/19/21 1702     Education Details progressed sitting VOR to standing    Person(s) Educated Patient    Methods Explanation;Demonstration;Tactile cues;Verbal cues;Handout    Comprehension Verbalized understanding;Returned demonstration              PT Short Term Goals - 06/05/21 1630       PT SHORT TERM GOAL #1   Title Patient to be independent with initial HEP.    Time 3    Period Weeks    Status On-going    Target Date 06/19/21               PT Long Term Goals - 06/05/21 1630       PT LONG TERM GOAL #1   Title Patient to be independent with advanced HEP.    Time 6    Period Weeks    Status On-going    Target Date 07/10/21      PT LONG TERM GOAL #2   Title Patient to score at least 22/24 on DGI in order to decrease risk of  falls.    Time 6    Period Weeks    Status On-going    Target Date 07/10/21      PT LONG TERM GOAL #3   Title Patient to improve step length and foot clearance with gait by 50%.    Time 6    Period Weeks    Status On-going    Target Date 07/10/21      PT LONG TERM GOAL #4   Title Patient to report 50% improvement in dizziness.    Time 6    Period Weeks    Status On-going    Target Date 07/10/21      PT LONG TERM GOAL #5   Title Patient to report 60% improvement in balance confidence.    Time 6    Period Weeks    Status On-going    Target Date 07/10/21                   Plan - 06/19/21 1703     Clinical Impression Statement Patient arrived to session with report of no improvement in dizziness and occasional "flashing light" sensation. Admits to HEP noncompliance, thus encouraged patient to be more consistent with HEP to allow dizziness to improve.  Reviewed HEP for max understanding and carryover. Patient able to progress VOR to quicker pace and standing rather than sitting. Habituation still brings on mild-moderate dizziness. Worked on SLS activities with fairly good stability. However, slowness and hesitation with turns and stepping activities eident. Patient tolerated session well and without complaints at end of session.    Comorbidities asthma, prostate CA, GBS 2000, HLD, HTN, seizures, history of complete heart block    PT Treatment/Interventions ADLs/Self Care Home Management;Canalith Repostioning;Cryotherapy;DME Instruction;Moist Heat;Gait training;Stair training;Functional mobility training;Therapeutic activities;Therapeutic exercise;Balance training;Neuromuscular re-education;Manual techniques;Patient/family education;Passive range of motion;Dry needling;Energy conservation;Vestibular;Taping    PT Next Visit Plan work on SLS, turning, descending stairs without UE support    Consulted and Agree with Plan of Care Patient             Patient will benefit from  skilled therapeutic  intervention in order to improve the following deficits and impairments:  Abnormal gait, Decreased range of motion, Difficulty walking, Dizziness, Decreased activity tolerance, Impaired flexibility, Improper body mechanics, Decreased balance, Decreased strength, Postural dysfunction  Visit Diagnosis: Unsteadiness on feet  Dizziness and giddiness     Problem List Patient Active Problem List   Diagnosis Date Noted   Hypokalemia 05/06/2021   CHB (complete heart block) ([place]) 05/05/2021   Syncope 05/04/2021   Paroxysmal atrial fibrillation ([place]) 05/04/2021   History of seizures 05/04/2021   Malignant melanoma of left forearm ([place]) 10/19/2019   Upper respiratory tract infection 02/14/2017   Porokeratosis 09/07/2014   Metatarsal deformity 09/07/2014   Pain in lower limb 09/07/2014   Squamous cell carcinoma in situ of skin 10/15/2013   Hives 10/06/2012   History of Guillain-Barre syndrome 03/07/2011   Hypertension 11/28/2010   Dyslipidemia 11/28/2010   ERECTILE DYSFUNCTION 01/05/2008   ASTHMA 10/28/2007   GERD 10/28/2007   DIZZINESS 10/28/2007   COUGH 10/28/2007   FASTING HYPERGLYCEMIA 10/28/2007   PROSTATE CANCER, HX OF 10/28/2007    Janene Harvey, PT, DPT 06/19/21 5:08 PM   [facility] [address] [place], [place], [address] Phone: [phone]   Fax:  [phone]  Name: Drew Smith MRN: [id number] Date of Birth: 1940-11-27   PHYSICAL THERAPY DISCHARGE SUMMARY  Visits from Start of Care: 3  Current functional level related to goals / functional outcomes: Unable to assess; patient did not return   Remaining deficits: Unable to assess   Education / Equipment: HEP  Plan: Patient agrees to discharge.  Patient goals were not met. Patient is being discharged due to not returning to PT.    Janene Harvey, PT, DPT 08/13/21 2:34 PM  [facility] [address] [place], [place], [place] [address] Phone # [phone] Fax # [phone]

## 2021-06-26 ENCOUNTER — Telehealth: Payer: Self-pay | Admitting: Family Medicine

## 2021-06-26 NOTE — Telephone Encounter (Signed)
Left message for patient to call back and schedule Medicare Annual Wellness Visit (AWV) either virtually or in office. Left  my Drew Smith number [phone] ? ? ?Last AWV 06/01/20 ? please schedule at anytime with [facility] Nurse Health Advisor 1 or 2 ? ? ? ?

## 2021-06-27 ENCOUNTER — Ambulatory Visit (INDEPENDENT_AMBULATORY_CARE_PROVIDER_SITE_OTHER): Payer: PPO

## 2021-06-27 VITALS — Ht 68.0 in | Wt 164.0 lb

## 2021-06-27 DIAGNOSIS — Z0000 Encounter for general adult medical examination without abnormal findings: Secondary | ICD-10-CM | POA: Diagnosis not present

## 2021-06-27 NOTE — Patient Instructions (Addendum)
?[name] , ?Thank you for taking time to come for your Medicare Wellness Visit. I appreciate your ongoing commitment to your health goals. Please review the following plan we discussed and let me know if I can assist you in the future.  ? ?These are the goals we discussed: ? Goals   ? ?   Exercise 3x per week (30 min per time)   ?   Patient Stated (pt-stated)   ?   Continue to stay healthy. ?  ?   Patient Stated   ?   Resume dancing when pandemic settles  ?  ? ?  ?  ?This is a list of the screening recommended for you and due dates:  ?Health Maintenance  ?Topic Date Due  ? COVID-19 Vaccine (1) 07/13/2021*  ? Zoster (Shingles) Vaccine (1 of 2) 09/27/2021*  ? Pneumonia Vaccine (2 - PCV) 06/28/2022*  ? Tetanus Vaccine  03/04/2040*  ? HPV Vaccine  Aged Out  ? Flu Shot  Discontinued  ?*Topic was postponed. The date shown is not the original due date.  ? ?Advanced directives: No Patient deferred ? ?Conditions/risks identified: None ? ?Next appointment: Follow up in one year for your annual wellness visit.  ? ?Preventive Care 25 Years and Older, Male ?Preventive care refers to lifestyle choices and visits with your health care provider that can promote health and wellness. ?What does preventive care include? ?A yearly physical exam. This is also called an annual well check. ?Dental exams once or twice a year. ?Routine eye exams. Ask your health care provider how often you should have your eyes checked. ?Personal lifestyle choices, including: ?Daily care of your teeth and gums. ?Regular physical activity. ?Eating a healthy diet. ?Avoiding tobacco and drug use. ?Limiting alcohol use. ?Practicing safe sex. ?Taking low doses of aspirin every day. ?Taking vitamin and mineral supplements as recommended by your health care provider. ?What happens during an annual well check? ?The services and screenings done by your health care provider during your annual well check will depend on your age, overall health, lifestyle risk  factors, and family history of disease. ?Counseling  ?Your health care provider may ask you questions about your: ?Alcohol use. ?Tobacco use. ?Drug use. ?Emotional well-being. ?Home and relationship well-being. ?Sexual activity. ?Eating habits. ?History of falls. ?Memory and ability to understand (cognition). ?Work and work Statistician. ?Screening  ?You may have the following tests or measurements: ?Height, weight, and BMI. ?Blood pressure. ?Lipid and cholesterol levels. These may be checked every 5 years, or more frequently if you are over 44 years old. ?Skin check. ?Lung cancer screening. You may have this screening every year starting at age 21 if you have a 30-pack-year history of smoking and currently smoke or have quit within the past 15 years. ?Fecal occult blood test (FOBT) of the stool. You may have this test every year starting at age 53. ?Flexible sigmoidoscopy or colonoscopy. You may have a sigmoidoscopy every 5 years or a colonoscopy every 10 years starting at age 45. ?Prostate cancer screening. Recommendations will vary depending on your family history and other risks. ?Hepatitis C blood test. ?Hepatitis B blood test. ?Sexually transmitted disease (STD) testing. ?Diabetes screening. This is done by checking your blood sugar (glucose) after you have not eaten for a while (fasting). You may have this done every 1-3 years. ?Abdominal aortic aneurysm (AAA) screening. You may need this if you are a current or former smoker. ?Osteoporosis. You may be screened starting at age 52 if you are  at high risk. ?Talk with your health care provider about your test results, treatment options, and if necessary, the need for more tests. ?Vaccines  ?Your health care provider may recommend certain vaccines, such as: ?Influenza vaccine. This is recommended every year. ?Tetanus, diphtheria, and acellular pertussis (Tdap, Td) vaccine. You may need a Td booster every 10 years. ?Zoster vaccine. You may need this after age  5. ?Pneumococcal 13-valent conjugate (PCV13) vaccine. One dose is recommended after age 72. ?Pneumococcal polysaccharide (PPSV23) vaccine. One dose is recommended after age 54. ?Talk to your health care provider about which screenings and vaccines you need and how often you need them. ?This information is not intended to replace advice given to you by your health care provider. Make sure you discuss any questions you have with your health care provider. ?Document Released: 03/10/2015 Document Revised: 11/01/2015 Document Reviewed: 12/13/2014 ?Elsevier Interactive Patient Education ? 2017 [employer]. ? ?Fall Prevention in the Home ?Falls can cause injuries. They can happen to people of all ages. There are many things you can do to make your home safe and to help prevent falls. ?What can I do on the outside of my home? ?Regularly fix the edges of walkways and driveways and fix any cracks. ?Remove anything that might make you trip as you walk through a door, such as a raised step or threshold. ?Trim any bushes or trees on the path to your home. ?Use bright outdoor lighting. ?Clear any walking paths of anything that might make someone trip, such as rocks or tools. ?Regularly check to see if handrails are loose or broken. Make sure that both sides of any steps have handrails. ?Any raised decks and porches should have guardrails on the edges. ?Have any leaves, snow, or ice cleared regularly. ?Use sand or salt on walking paths during winter. ?Clean up any spills in your garage right away. This includes oil or grease spills. ?What can I do in the bathroom? ?Use night lights. ?Install grab bars by the toilet and in the tub and shower. Do not use towel bars as grab bars. ?Use non-skid mats or decals in the tub or shower. ?If you need to sit down in the shower, use a plastic, non-slip stool. ?Keep the floor dry. Clean up any water that spills on the floor as soon as it happens. ?Remove soap buildup in the tub or shower  regularly. ?Attach bath mats securely with double-sided non-slip rug tape. ?Do not have throw rugs and other things on the floor that can make you trip. ?What can I do in the bedroom? ?Use night lights. ?Make sure that you have a light by your bed that is easy to reach. ?Do not use any sheets or blankets that are too big for your bed. They should not hang down onto the floor. ?Have a firm chair that has side arms. You can use this for support while you get dressed. ?Do not have throw rugs and other things on the floor that can make you trip. ?What can I do in the kitchen? ?Clean up any spills right away. ?Avoid walking on wet floors. ?Keep items that you use a lot in easy-to-reach places. ?If you need to reach something above you, use a strong step stool that has a grab bar. ?Keep electrical cords out of the way. ?Do not use floor polish or wax that makes floors slippery. If you must use wax, use non-skid floor wax. ?Do not have throw rugs and other things on the floor  that can make you trip. ?What can I do with my stairs? ?Do not leave any items on the stairs. ?Make sure that there are handrails on both sides of the stairs and use them. Fix handrails that are broken or loose. Make sure that handrails are as long as the stairways. ?Check any carpeting to make sure that it is firmly attached to the stairs. Fix any carpet that is loose or worn. ?Avoid having throw rugs at the top or bottom of the stairs. If you do have throw rugs, attach them to the floor with carpet tape. ?Make sure that you have a light switch at the top of the stairs and the bottom of the stairs. If you do not have them, ask someone to add them for you. ?What else can I do to help prevent falls? ?Wear shoes that: ?Do not have high heels. ?Have rubber bottoms. ?Are comfortable and fit you well. ?Are closed at the toe. Do not wear sandals. ?If you use a stepladder: ?Make sure that it is fully opened. Do not climb a closed stepladder. ?Make sure that  both sides of the stepladder are locked into place. ?Ask someone to hold it for you, if possible. ?Clearly mark and make sure that you can see: ?Any grab bars or handrails. ?First and last steps. ?Where the edge

## 2021-06-27 NOTE — Progress Notes (Signed)
? ?Subjective:  ? Drew Smith is a 81 y.o. male who presents for Medicare Annual/Subsequent preventive examination. ? ?Review of Systems    ?Virtual Visit via Telephone Note ? ?I connected with  Drew Smith on 06/27/21 at  3:45 PM EDT by telephone and verified that I am speaking with the correct person using two identifiers. ? ?Location: ?Patient: Home ?Provider: Office ?Persons participating in the virtual visit: patient/Nurse Health Advisor ?  ?I discussed the limitations, risks, security and privacy concerns of performing an evaluation and management service by telephone and the availability of in person appointments. The patient expressed understanding and agreed to proceed. ? ?Interactive audio and video telecommunications were attempted between this nurse and patient, however failed, due to patient having technical difficulties OR patient did not have access to video capability.  We continued and completed visit with audio only. ? ?Some vital signs may be absent or patient reported.  ? ?Criselda Peaches, LPN  ?Cardiac Risk Factors include: advanced age (>73mn, >>79women);hypertension;male gender ? ?   ?Objective:  ?  ?Today's Vitals  ? 06/27/21 1551  ?Weight: 164 lb (74.4 kg)  ?Height: '5\' 8"'$  (1.727 m)  ? ?Body mass index is 24.94 kg/m?. ? ? ?  06/27/2021  ?  3:59 PM 05/29/2021  ?  2:00 PM 04/18/2021  ?  2:10 PM 03/06/2021  ?  9:01 AM 06/06/2020  ?  2:48 PM 04/25/2020  ? 11:06 AM 04/30/2019  ? 10:24 AM  ?Advanced Directives  ?Does Patient Have a Medical Advance Directive? No No Yes Yes Yes Yes Yes  ?Type of Advance Directive    [place];Healthcare Power of Attorney  ?Does patient want to make changes to medical advance directive? No - Patient declined     No - Patient declined No - Patient declined  ?Copy of [place]?     No - copy requested No - copy requested No - copy requested   ?Would patient like information on creating a medical advance directive? No - Patient declined No - Patient declined   No - Patient declined No - Patient declined   ? ? ?Current Medications (verified) ?Outpatient Encounter Medications as of 06/27/2021  ?Medication Sig  ? amLODipine (NORVASC) 5 MG tablet Take 1 tablet (5 mg total) by mouth daily.  ? apixaban (ELIQUIS) 5 MG TABS tablet Take 1 tablet (5 mg total) by mouth 2 (two) times daily.  ? desonide (DESOWEN) 0.05 % cream Apply 1 application. topically 2 (two) times daily.  ? gabapentin (NEURONTIN) 100 MG capsule Take one to two capsules by mouth at night as needed for back pain.  ? hydrochlorothiazide (MICROZIDE) 12.5 MG capsule Take 1 capsule (12.5 mg total) by mouth daily.  ? ketoconazole (NIZORAL) 2 % cream SMARTSIG:1 Application Topical 1 to 2 Times Daily  ? levETIRAcetam (KEPPRA) 500 MG tablet Take 1 tablet (500 mg total) by mouth 2 (two) times daily.  ? levocetirizine (XYZAL) 5 MG tablet TAKE ONE TABLET BY MOUTH EVERY EVENING  ? Multiple Vitamins-Minerals (CENTRUM SILVER 50+MEN PO) Take 1 tablet by mouth daily as needed.  ? omeprazole (PRILOSEC) 20 MG capsule TAKE ONE CAPSULE BY MOUTH TWICE A DAY (Patient taking differently: 20 mg daily.)  ? Oxymetazoline HCl (NASAL SPRAY) 0.05 % SOLN Place into the nose.  ? traMADol (ULTRAM) 50 MG tablet Take 50 mg by mouth every 6 (six) hours as needed.  ?  VITAMIN D, CHOLECALCIFEROL, PO Take 1 tablet by mouth every other day. 5000 units every other day  ? zinc gluconate 50 MG tablet Take 50 mg by mouth daily as needed.  ? ?No facility-administered encounter medications on file as of 06/27/2021.  ? ? ?Allergies (verified) ?Patient has no known allergies.  ? ?History: ?Past Medical History:  ?Diagnosis Date  ? Arthritis   ? Asthma   ? Cancer [facility])   ? prostate  ? GERD (gastroesophageal reflux disease)   ? Guillain-Barre syndrome ([place]) 02/25/1998  ? Hyperlipidemia   ? Hypertension   ? Seizures ([place])   ? ?Past Surgical History:   ?Procedure Laterality Date  ? CATARACT EXTRACTION    ? EYE SURGERY  02/26/1943  ? 81 years old  ? HERNIA REPAIR  02/25/1993  ? NOSE SURGERY  02/26/1988  ? PROSTATE SURGERY  02/25/1998  ? seed inplants  ? SHOULDER SURGERY  02/25/2006  ? ?Family History  ?Problem Relation Age of Onset  ? Alcohol abuse Father   ? Hyperlipidemia Brother   ? Stroke Brother   ? Diabetes Brother   ?     type ll  ? ?Social History  ? ?Socioeconomic History  ? Marital status: Divorced  ?  Spouse name: Not on file  ? Number of children: 3  ? Years of education: Not on file  ? Highest education level: Some college, no degree  ?Occupational History  ? Occupation: retired  ?Tobacco Use  ? Smoking status: Never  ? Smokeless tobacco: Never  ?Vaping Use  ? Vaping Use: Never used  ?Substance and Sexual Activity  ? Alcohol use: Yes  ?  Alcohol/week: 7.0 standard drinks  ?  Types: 7 Glasses of wine per week  ?  Comment: have 1/2 a day; wine or other   ? Drug use: No  ? Sexual activity: Not Currently  ?Other Topics Concern  ? Not on file  ?Social History Narrative  ? Lives alone in one level. Has two sons 1 daughter and friends who serve as support.  ? Former Chiropodist; Enjoys Data processing manager, reading  ? Originally from [place]  ? ?Social Determinants of Health  ? ?Financial Resource Strain: Low Risk   ? Difficulty of Paying Living Expenses: Not hard at all  ?Food Insecurity: No Food Insecurity  ? Worried About Charity fundraiser in the Last Year: Never true  ? Ran Out of Food in the Last Year: Never true  ?Transportation Needs: No Transportation Needs  ? Lack of Transportation (Medical): No  ? Lack of Transportation (Non-Medical): No  ?Physical Activity: Sufficiently Active  ? Days of Exercise per Week: 7 days  ? Minutes of Exercise per Session: 30 min  ?Stress: No Stress Concern Present  ? Feeling of Stress : Not at all  ?Social Connections: Moderately Integrated  ? Frequency of Communication with Friends and Family: More than three times a week  ?  Frequency of Social Gatherings with Friends and Family: More than three times a week  ? Attends Religious Services: More than 4 times per year  ? Active Member of Clubs or Organizations: Yes  ? Attends Archivist Meetings: More than 4 times per year  ? Marital Status: Never married  ? ? ?Tobacco Counseling ?Counseling given: Not Answered ? ? ?Clinical Intake: ? ? ?Diabetic?  No ? ? ? ?Activities of Daily Living ? ?  06/27/2021  ?  3:57 PM 05/05/2021  ? 11:23 AM  ?In your present  state of health, do you have any difficulty performing the following activities:  ?Hearing? 0   ?Vision? 0   ?Difficulty concentrating or making decisions? 0   ?Walking or climbing stairs? 0   ?Dressing or bathing? 0   ?Doing errands, shopping? 0 1  ?Preparing Food and eating ? N   ?Using the Toilet? N   ?In the past six months, have you accidently leaked urine? N   ?Do you have problems with loss of bowel control? N   ?Managing your Medications? N   ?Managing your Finances? N   ?Housekeeping or managing your Housekeeping? N   ? ? ?Patient Care Team: ?[name], MD as PCP - General (Family Medicine) ?Stephannie Li, OD (Ophthalmology) ?Viona Gilmore, [facility] as Pharmacist (Pharmacist) ?Cameron Sprang, MD as Consulting Physician (Neurology) ? ?Indicate any recent Medical Services you may have received from other than [facility] providers in the past year (date may be approximate). ? ?   ?Assessment:  ? This is a routine wellness examination for Jihan. ? ?Hearing/Vision screen ?Hearing Screening - Comments:: No difficulty hearing ?Vision Screening - Comments:: Wears glasses. Followed by Dr Aliene Beams ? ?Dietary issues and exercise activities discussed: ?Exercise limited by: None identified ? ? Goals Addressed   ? ?  ?  ?  ?  ?  ? This Visit's Progress  ?   Patient Stated (pt-stated)     ?   Continue to stay healthy. ?  ? ?  ? ?Depression Screen ? ?  06/27/2021  ?  3:55 PM 04/23/2021  ? 12:00 PM 08/29/2020  ?  3:43 PM 06/01/2020  ?  3:02  PM 03/26/2019  ? 11:47 AM 03/31/2018  ?  9:08 AM 03/20/2018  ? 10:36 AM  ?PHQ 2/9 Scores  ?PHQ - 2 Score 0 2 0 0 0 0 2  ?PHQ- 9 Score  7 1   0 3  ?  ?Fall Risk ? ?  06/27/2021  ?  3:58 PM 04/18/2021  ?  2:09

## 2021-07-05 DIAGNOSIS — M5136 Other intervertebral disc degeneration, lumbar region: Secondary | ICD-10-CM | POA: Diagnosis not present

## 2021-07-05 DIAGNOSIS — M9905 Segmental and somatic dysfunction of pelvic region: Secondary | ICD-10-CM | POA: Diagnosis not present

## 2021-07-05 DIAGNOSIS — M9904 Segmental and somatic dysfunction of sacral region: Secondary | ICD-10-CM | POA: Diagnosis not present

## 2021-07-05 DIAGNOSIS — M9903 Segmental and somatic dysfunction of lumbar region: Secondary | ICD-10-CM | POA: Diagnosis not present

## 2021-07-09 ENCOUNTER — Telehealth: Payer: Self-pay | Admitting: Pharmacist

## 2021-07-09 NOTE — Chronic Care Management (AMB) (Signed)
? ? ?  Chronic Care Management ?Pharmacy Assistant  ? ?Name: [name]  MRN: [id number] DOB: 1940-07-31 ? ?07/10/2021 APPOINTMENT REMINDER ? ? ?Called Drew Smith Joycelyn Rua, No answer, left message of appointment on 07/10/2021 at 1:30 via telephone visit with Jeni Salles, Pharm D. Notified to have all medications, supplements, blood pressure and/or blood sugar logs available during appointment and to return call if need to reschedule. ? ?Care Gaps: ?AWV - completed 06/27/2021 ?Last BP - 135/80 on 06/05/2021 ?  ?Star Rating Drugs: ?None ? ? ?Any gaps in medications fill history? No ? ?Gennie Alma CMA  ?Clinical Pharmacist Assistant ?[phone] ? ?

## 2021-07-10 ENCOUNTER — Ambulatory Visit (INDEPENDENT_AMBULATORY_CARE_PROVIDER_SITE_OTHER): Payer: PPO | Admitting: Pharmacist

## 2021-07-10 DIAGNOSIS — I10 Essential (primary) hypertension: Secondary | ICD-10-CM

## 2021-07-10 DIAGNOSIS — I480 Paroxysmal atrial fibrillation: Secondary | ICD-10-CM

## 2021-07-10 NOTE — Progress Notes (Signed)
? ?Chronic Care Management ?Pharmacy Note ? ?07/10/2021 ?Name:  Drew Smith MRN:  [id number] DOB:  08/07/1940 ? ?Summary: ?BP is at goal in office < 140/90 but does not monitor at home ?Pt is not taking medications as prescribed ?  ?Recommendations/Changes made from today's visit: ?-Recommend trial of Xarelto to improve adherence ?-Recommended purchasing a BP cuff to use at home ?-Recommended consistent use of BP medications ?  ?Plan: ?BP assessment in 1 month ?Follow up in 3-4 months ? ? ?Subjective: ?Drew Smith is an 81 y.o. year old male who is a primary patient of Burchette, Alinda Sierras, MD.  The CCM team was consulted for assistance with disease management and care coordination needs.   ? ?Engaged with patient by telephone for follow up visit in response to provider referral for pharmacy case management and/or care coordination services.  ? ?Consent to Services:  ?The patient was given information about Chronic Care Management services, agreed to services, and gave verbal consent prior to initiation of services.  Please see initial visit note for detailed documentation.  ? ?Patient Care Team: ?[name], MD as PCP - General (Family Medicine) ?Stephannie Li, OD (Ophthalmology) ?Viona Gilmore, [facility] as Pharmacist (Pharmacist) ?Cameron Sprang, MD as Consulting Physician (Neurology) ? ?Recent office visits: ?06/27/21 Rolene Arbour, LPN: Patient presented for AWV. ? ?05/15/2021 [name] MD - Patient was seen for Hypokalemia and additional issues. Referral to physical therapy. Discontinued Klor-Con M. Follow up in 2 months.  ?  ?04/23/2021 Betty Martinique MD - Patient was seen for jaw pain and additional issues. No medication changes. Follow up in 4 weeks  ? ?Recent consult visits: ?06/19/21 Janene Harvey, PT (neuro rehab): Patient presented for PT session for unsteadiness on feet. ? ?06/05/21 Rex Kras, DO (cardiology): Patient presented for Afib follow up. Referral placed for sleep  study. Follow up in 3 months. ? ?05/04/2021 Cristopher Peru MD (cardiology) - Patient was seen for Paroxysmal atrial fibrillation and additional issues. No medication changes. Follow up in 3 months.  ?  ?05/01/2021 Sunit Tolia DO (cardiology) - Patient was seen for High degree atrioventricular block and additional issues. No medication changes. Follow up in 4 weeks.  ?  ?04/18/2021 Ellouise Newer MD (neurology) - Patient was seen for loss of consciousness. No medication changes. Follow up in 3 months.  ?  ?04/04/2021 Darleen Crocker (opthamology) - Patient was seen for age related nuclear cataract left eye. No other chart note.  ?  ?04/02/2021 Marrion Coy (chiropractor) - Patient was seen for Other intervertebral disc degeneration, lumbar region and additional issues. No other chart notes.  ?  ?03/27/2021 Adrian Prows (cardiology) - Patient was seen for essential hypertension and additional issues. No other chart notes.  ?  ?03/27/2021 Marrion Coy (chiropractor) - Patient was seen for Other intervertebral disc degeneration, lumbar region and additional issues. No other chart notes. ?  ?03/15/2021 Sunit Tolia DO (cardiology) - Patient was seen for loss of consciousness and additional issues. Discontinued Efudex and Kenalog. Follow up in 6 weeks.  ? ?Hospital visits: ?Admitted to [facility] on 05/04/2021 due to Syncope and additional issues. Discharge date was 05/07/2021.   ?New?Medications Started at [facility] Discharge:?? ?apixaban Arne [place]) ?potassium chloride SA (KLOR-CON M) ?Medication Changes at Hospital Discharge: ?No medication changes ?Medications Discontinued at Hospital Discharge: ?acyclovir 400 MG tablet (ZOVIRAX) ?ibuprofen 800 MG tablet (ADVIL) ?Medications that remain the same after Hospital Discharge:??  ?-All other medications will remain the same.  ? ?Objective: ? ?  Lab Results  ?Component Value Date  ? CREATININE 1.07 05/15/2021  ? BUN 17 05/15/2021  ? GFR 64.99 05/15/2021  ? GFRNONAA 56 (L)  05/06/2021  ? GFRAA 124 10/28/2007  ? NA 135 05/15/2021  ? K 4.1 05/15/2021  ? CALCIUM 9.5 05/15/2021  ? CO2 27 05/15/2021  ? GLUCOSE 146 (H) 05/15/2021  ? ? ?Lab Results  ?Component Value Date/Time  ? HGBA1C 5.5 05/15/2021 12:02 PM  ? HGBA1C 5.8 (A) 06/28/2020 04:16 PM  ? HGBA1C 5.4 12/08/2015 02:10 PM  ? GFR 64.99 05/15/2021 12:02 PM  ? GFR 80.97 02/09/2020 08:11 AM  ?  ?Last diabetic Eye exam: No results found for: HMDIABEYEEXA  ?Last diabetic Foot exam: No results found for: HMDIABFOOTEX  ? ?Lab Results  ?Component Value Date  ? CHOL 163 04/05/2019  ? HDL 47.60 04/05/2019  ? [place] 95 04/05/2019  ? TRIG 103.0 04/05/2019  ? CHOLHDL 3 04/05/2019  ? ? ? ?  Latest Ref Rng & Units 05/04/2021  ?  8:18 PM 03/03/2021  ? 12:44 AM 02/09/2020  ?  8:11 AM  ?Hepatic Function  ?Total Protein 6.5 - 8.1 g/dL 6.6   6.6   6.5    ?Albumin 3.5 - 5.0 g/dL 3.6   3.7   3.8    ?AST 15 - 41 U/L _0 ?ALT 0 - 44 U/L _1 ?Alk Phosphatase 38 - 126 U/L 57   57   70    ?Total Bilirubin 0.3 - 1.2 mg/dL 0.5   0.8   0.6    ?Bilirubin, Direct 0.0 - 0.2 mg/dL  0.1     ? ? ?Lab Results  ?Component Value Date/Time  ? TSH 1.63 08/29/2020 04:11 PM  ? TSH 2.96 04/05/2019 10:18 AM  ? ? ? ?  Latest Ref Rng & Units 05/06/2021  ? 12:08 AM 05/04/2021  ?  8:18 PM 03/02/2021  ? 11:39 PM  ?CBC  ?WBC 4.0 - 10.5 K/uL 8.3   6.8   11.1    ?Hemoglobin 13.0 - 17.0 g/dL 13.7   14.3   14.9    ?Hematocrit 39.0 - 52.0 % 40.3   40.4   43.2    ?Platelets 150 - 400 K/uL 242   240   261    ? ? ?No results found for: VD25OH ? ?Clinical ASCVD: No  ?The ASCVD Risk score (Arnett DK, et al., 2019) failed to calculate for the following reasons: ?  The 2019 ASCVD risk score is only valid for ages 62 to 68   ? ? ?  06/27/2021  ?  3:55 PM 04/23/2021  ? 12:00 PM 08/29/2020  ?  3:43 PM  ?Depression screen PHQ 2/9  ?Decreased Interest 0 1 0  ?Down, Depressed, Hopeless 0 1 0  ?PHQ - 2 Score 0 2 0  ?Altered sleeping  1 0  ?Tired, decreased energy  0 0  ?Change in appetite  0  0  ?Feeling bad or failure about yourself   1 0  ?Trouble concentrating  2 1  ?Moving slowly or fidgety/restless  0 0  ?Suicidal thoughts  1 0  ?PHQ-9 Score  7 1  ?Difficult doing work/chores  Somewhat difficult Not difficult at all  ?  ? ? ?Social History  ? ?Tobacco Use  ?Smoking Status Never  ?Smokeless Tobacco Never  ? ?BP Readings from Last 3 Encounters:  ?06/19/21 128/72  ?  06/05/21 135/80  ?05/29/21 138/75  ? ?Pulse Readings from Last 3 Encounters:  ?06/19/21 63  ?06/05/21 65  ?05/29/21 62  ? ?Wt Readings from Last 3 Encounters:  ?06/27/21 164 lb (74.4 kg)  ?06/05/21 164 lb 9.6 oz (74.7 kg)  ?05/15/21 164 lb 8 oz (74.6 kg)  ? ?BMI Readings from Last 3 Encounters:  ?06/27/21 24.94 kg/m?  ?06/05/21 25.03 kg/m?  ?05/15/21 25.01 kg/m?  ? ? ?Assessment/Interventions: Review of patient past medical history, allergies, medications, health status, including review of consultants reports, laboratory and other test data, was performed as part of comprehensive evaluation and provision of chronic care management services.  ? ?SDOH:  (Social Determinants of Health) assessments and interventions performed: Yes ? ?SDOH Screenings  ? ?Alcohol Screen: Low Risk   ? Last Alcohol Screening Score (AUDIT): 4  ?Depression (PHQ2-9): Low Risk   ? PHQ-2 Score: 0  ?Financial Resource Strain: Low Risk   ? Difficulty of Paying Living Expenses: Not hard at all  ?Food Insecurity: No Food Insecurity  ? Worried About Charity fundraiser in the Last Year: Never true  ? Ran Out of Food in the Last Year: Never true  ?Housing: Low Risk   ? Last Housing Risk Score: 0  ?Physical Activity: Sufficiently Active  ? Days of Exercise per Week: 7 days  ? Minutes of Exercise per Session: 30 min  ?Social Connections: Moderately Integrated  ? Frequency of Communication with Friends and Family: More than three times a week  ? Frequency of Social Gatherings with Friends and Family: More than three times a week  ? Attends Religious Services: More than 4 times  per year  ? Active Member of Clubs or Organizations: Yes  ? Attends Archivist Meetings: More than 4 times per year  ? Marital Status: Never married  ?Stress: No Stress Concern Present  ? Feeling

## 2021-07-24 ENCOUNTER — Encounter: Payer: Self-pay | Admitting: Family Medicine

## 2021-07-24 ENCOUNTER — Ambulatory Visit (INDEPENDENT_AMBULATORY_CARE_PROVIDER_SITE_OTHER): Payer: PPO | Admitting: Family Medicine

## 2021-07-24 VITALS — BP 110/60 | HR 67 | Temp 98.0°F | Ht 68.0 in | Wt 163.9 lb

## 2021-07-24 DIAGNOSIS — I480 Paroxysmal atrial fibrillation: Secondary | ICD-10-CM | POA: Diagnosis not present

## 2021-07-24 DIAGNOSIS — S00511A Abrasion of lip, initial encounter: Secondary | ICD-10-CM | POA: Diagnosis not present

## 2021-07-24 DIAGNOSIS — I10 Essential (primary) hypertension: Secondary | ICD-10-CM

## 2021-07-24 MED ORDER — MUPIROCIN 2 % EX OINT: 1.0000 "application " | TOPICAL_OINTMENT | Freq: Two times a day (BID) | CUTANEOUS | 0 refills | Status: DC

## 2021-07-24 NOTE — Progress Notes (Signed)
Established Patient Office Visit  Subjective   Patient ID: Drew Smith, male    DOB: September 01, 1940  Age: 81 y.o. MRN: [id number]  Chief Complaint  Patient presents with   Follow-up    HPI   Here for medical follow-up.  History of complete heart block, hypertension, A-fib, remote history of prostate cancer, history of recurrent syncope, history of seizures, dyslipidemia.  He had recent pharmacist follow-up.  There was concern regarding compliance with blood pressure medications.  Patient states he is taking Microzide regularly but apparently only taking amlodipine about every other day.  At 1 point he had some foot edema and stopped the amlodipine but states he is currently taking every other day.  Does not monitor blood pressure closely at home.  He was advised to get cuff per pharmacist but never did.  He also is not taking his Eliquis twice daily but only once daily.  There have been discussion of switching to Xarelto because of his compliance issues.  He is concerned regarding cost.  He is followed by cardiology.  His recent history is somewhat complicated but apparently had episodes of syncope September 2022 and again January 2023.  Refer to cardiology for further evaluation.  Had multiple tests including echocardiogram, stress test, carotid duplex and 14-day Holter monitor.  He had preserved left ventricular ejection fraction but did have some left ventricular perjury on echo initially concerning for HCM.  Exercise stress test low risk.  Holter monitor did show occasional pauses with intermittent A-fib and concern for intermittent complete heart block.  He was referred to EP specialist for consideration for pacemaker.  In the meantime, he was rehospitalized March 2023 with near syncope.  Cardiac MRI recommended because of his possible HCM and those results showed asymmetric septal hypertrophy cardiology felt like his episodes of transient heart block were likely vagal mediated and did  not recommend pacemaker.  He has done well since then with no further syncope.  Does have history of seizures and remains on Keppra.  Followed by neurology.  Still golfing a few times per week.  No recent falls.  Does have some occasional bruising of extremities.  Past Medical History:  Diagnosis Date   Arthritis    Asthma    Cancer ([place])    prostate   GERD (gastroesophageal reflux disease)    Guillain-Barre syndrome ([place]) 02/25/1998   Hyperlipidemia    Hypertension    Seizures ([place])    Past Surgical History:  Procedure Laterality Date   CATARACT EXTRACTION     EYE SURGERY  02/26/1943   81 years old   HERNIA REPAIR  02/25/1993   NOSE SURGERY  02/26/1988   PROSTATE SURGERY  02/25/1998   seed inplants   SHOULDER SURGERY  02/25/2006    reports that he has never smoked. He has never used smokeless tobacco. He reports current alcohol use of about 7.0 standard drinks per week. He reports that he does not use drugs. family history includes Alcohol abuse in his father; Diabetes in his brother; Hyperlipidemia in his brother; Stroke in his brother. No Known Allergies  Review of Systems  Constitutional:  Negative for chills and fever.  Respiratory:  Negative for cough and shortness of breath.   Cardiovascular:  Negative for chest pain and palpitations.  Genitourinary:  Negative for hematuria.  Neurological:  Negative for dizziness and focal weakness.     Objective:     BP 110/60 (BP Location: Left Arm, Patient Position: Sitting, Cuff Size: Normal)  Pulse 67   Temp 98 F (36.7 C) (Oral)   Ht '5\' 8"'$  (1.727 m)   Wt 163 lb 14.4 oz (74.3 kg)   SpO2 98%   BMI 24.92 kg/m    Physical Exam Constitutional:      Appearance: He is well-developed.  HENT:     Ears:     Comments: Mild scaling and eczema changes external canals. Eyes:     Pupils: Pupils are equal, round, and reactive to light.  Neck:     Thyroid: No thyromegaly.  Cardiovascular:     Rate and Rhythm: Normal rate  and regular rhythm.  Pulmonary:     Effort: Pulmonary effort is normal. No respiratory distress.     Breath sounds: Normal breath sounds. No wheezing or rales.  Musculoskeletal:     Cervical back: Neck supple.     Right lower leg: No edema.     Left lower leg: No edema.  Skin:    Comments: Does have what appears to be a small superficial abrasion upper lip near the midline.  No signs of secondary infection.  Neurological:     Mental Status: He is alert and oriented to person, place, and time.     No results found for any visits on 07/24/21.    The ASCVD Risk score (Arnett DK, et al., 2019) failed to calculate for the following reasons:   The 2019 ASCVD risk score is only valid for ages 8 to 71    Assessment & Plan:   #1 history of paroxysmal atrial fibrillation.  Currently regular sinus rhythm.  Stressed importance of twice daily dosing with Eliquis.  We did discuss alternative of Xarelto (once daily for hopefully improve compliance) but he is concerned about cost.  He will check on coverage with his current insurance plan.  #2 hypertension.  Currently well controlled today.  He is not consistently taking his amlodipine.  Recommend close home monitoring.  Stressed importance of taking his blood pressure medication daily.  #3 small abrasion upper lip.  Question from shaving.  No signs of secondary infection.  Keep clean with soap and water and consider Bactroban topically twice daily and watch for signs of infection.  Return in about 3 months (around 10/24/2021).    [name], MD

## 2021-07-24 NOTE — Patient Instructions (Signed)
Blood pressure looks good today  Check on coverage for Xarelto (once daily blood thinner)  Should be taking the Eliquis TWICE daily.

## 2021-07-25 DIAGNOSIS — I4891 Unspecified atrial fibrillation: Secondary | ICD-10-CM | POA: Diagnosis not present

## 2021-07-25 DIAGNOSIS — I10 Essential (primary) hypertension: Secondary | ICD-10-CM

## 2021-07-26 ENCOUNTER — Telehealth: Payer: Self-pay | Admitting: Family Medicine

## 2021-07-26 NOTE — Telephone Encounter (Signed)
Pt called to get a refill on Rx omeprazole 20 MG; he stated he called the pharm however the pharm said it was denied through our office. If it is denied pt would like someone to give him a call stating the reason for the denial. If Rx is okay to refill then please send to   [facility] [id number] [place], [place] Phone:  [phone]  Fax:  [phone]     Please advise.

## 2021-07-27 MED ORDER — OMEPRAZOLE 20 MG PO CPDR: 20.0000 mg | DELAYED_RELEASE_CAPSULE | Freq: Two times a day (BID) | ORAL | 0 refills | Status: DC

## 2021-07-27 NOTE — Telephone Encounter (Signed)
Rx sent 

## 2021-07-31 ENCOUNTER — Ambulatory Visit: Payer: PPO | Admitting: Neurology

## 2021-07-31 ENCOUNTER — Encounter: Payer: Self-pay | Admitting: Neurology

## 2021-07-31 VITALS — BP 151/77 | HR 63 | Ht 68.0 in | Wt 159.0 lb

## 2021-07-31 DIAGNOSIS — M9904 Segmental and somatic dysfunction of sacral region: Secondary | ICD-10-CM | POA: Diagnosis not present

## 2021-07-31 DIAGNOSIS — M9903 Segmental and somatic dysfunction of lumbar region: Secondary | ICD-10-CM | POA: Diagnosis not present

## 2021-07-31 DIAGNOSIS — M5136 Other intervertebral disc degeneration, lumbar region: Secondary | ICD-10-CM | POA: Diagnosis not present

## 2021-07-31 DIAGNOSIS — R4020 Unspecified coma: Secondary | ICD-10-CM | POA: Diagnosis not present

## 2021-07-31 DIAGNOSIS — M9905 Segmental and somatic dysfunction of pelvic region: Secondary | ICD-10-CM | POA: Diagnosis not present

## 2021-07-31 MED ORDER — LEVETIRACETAM 500 MG PO TABS: 500.0000 mg | ORAL_TABLET | Freq: Two times a day (BID) | ORAL | 3 refills | Status: DC

## 2021-07-31 NOTE — Progress Notes (Signed)
NEUROLOGY FOLLOW UP OFFICE NOTE  Drew Smith [id number] 09/11/1940  HISTORY OF PRESENT ILLNESS: I had the pleasure of seeing Drew Smith in follow-up in the neurology clinic on 07/31/2021.  The patient was last seen 4 months ago for episodes of loss of consciousness. He is alone in the office today. Records and images were personally reviewed where available.  His 1-hour wake and sleep EEG was normal. He has had several cardiac studies and decided to hold off on brain MRI. Since his last visit, he had a 14-day holter monitor with nocturnal pauses and one episode with pause and complete heart block along with intermittent paroxysmal atrial fibrillation. He was started on Eliquis. He was admitted in March 2023 for near syncope, he reports he overdid it that day and progressively felt worse with his dizziness. He felt a little better after eating, but after speaking to his children, decided to go to the ER. He did not lose consciousness. He had a cardiac MRI consistent with asymmetrical septal hypertrophy, meeting criteria for HCM. Cardiac electrophysiology suspected his episodes of pauses/transient complete heart block was likely due to vagal medicated events and did not recommend pacemaker. There was concern for undiagnosed sleep apnea, he will be seeing Sleep medicine. He denies any staring/unresponsive episodes, gaps in time,olfactory/gustatory hallucinations, focal numbness/tingling/weakness, myoclonic jerks. No loss of consciousness since 03/02/2021. He was started on Levetiracetam '500mg'$  BID in the ER after the episode in January. He denies any headaches, vision changes, no falls. His feet occasionally feel numb, he continues to play golf frequently and regularly exercises.    History on Initial Assessment 03/06/2021: This is an 81 year old right-handed man with a history of hypertension, hyperlipidemia, prostate cancer, presenting for 2 episodes of loss of consciousness. He is accompanied by  his son Lennette Bihari who helps supplement the history today. The first event occurred on 11/16/20 while at [facility] and he was brought to [facility]. He was attending a dance festival for several days, he danced earlier that day and recalls having breakfast, went to a mid-day club party (did not drink alcohol), went back to his room to rest for a couple of hours. He recalls laying on the bed then waking up to people around him yelling, unable to understand them. Lennette Bihari reports he heard a noise and found his father moaning and groaning, he was not responding with eyes opening and closing "almost like having a bad dream." This lasted 5 minutes, he woke up and did not realize what was going on, it took about 10 minutes before he could answer orientation questions. No focal weakness, tongue bite or incontinence. Per notes, he was feverish, diaphoretic and EMS reported O2 sats in the 80s. Mental status improved en route to hospital. CT chest showed diffuse ground-glass in the right lung that can be seen in atypical particularly viral infections, WBC 13,  he was treated with Rocephin and Zmax and mental status returned to normal very rapidly. The next event occurred on 03/02/2021, again after a day of activity playing golf. He ate breakfast and had snacks, went home to clean up. He recalls feeling great when he lay down for a nap at 4pm, then woke up face down on the floor curled up in blankets around 6pm. He had difficulty getting up initially but managed to stand feeling woozy. His other son called and came, he felt fine but had a large abrasion on the bridge of his nose and bit his tongue, so they  decided to go to the ER. Bloodwork showed WBC 11.1, Na 133. UDS, EtOH negative. I personally reviewed head CT without contrast which did not show any acute changes. Symptoms felt concerning for seizure so he was discharged home on Levetiracetam '500mg'$  BID which makes him a little drowsy.  He has had brief body jerks in the  past 3-5 years. He and his son deny any staring/unresponsive episodes. He denies any olfactory/gustatory hallucinations, deja vu, rising epigastric sensation, focal numbness/tingling/weakness. He has a "little problem with balance" and does balance exercises regularly. He denies any headaches, diplopia, dysarthria/dysphagia, neck pain, bowel/bladder dysfunction. His back bothers him some. He feels his memory is pretty good for his age, he makes notes all the time. He denies missing medications, bill payments, or getting lost driving. 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 He had a normal birth and early development.  There is no history of febrile convulsions, CNS infections such as meningitis/encephalitis, significant traumatic brain injury, or family history of seizures. He drinks about 1.5 glasses of alcohol daily and recalls having a little more during the dance festival in 10/2020 and one beer after his golf game on 03/02/21. His son notes a fall 2 weeks ago after he had 4 drinks, he fell getting out of the car in the dark, no loss of consciousness. He reports high anxiety with very infrequent palpitations, no chest pain or shortness of breath.    PAST MEDICAL HISTORY: Past Medical History:  Diagnosis Date   Arthritis    Asthma    Cancer ([place])    prostate   GERD (gastroesophageal reflux disease)    Guillain-Barre syndrome ([place]) 02/25/1998   Hyperlipidemia    Hypertension    Seizures ([facility])     MEDICATIONS: Current Outpatient Medications on File Prior to Visit  Medication Sig Dispense Refill   amLODipine (NORVASC) 5 MG tablet Take 1 tablet (5 mg total) by mouth daily. 90 tablet 3   apixaban (ELIQUIS) 5 MG TABS tablet Take 1 tablet (5 mg total) by mouth 2 (two) times daily. 60 tablet 2   Cyanocobalamin (VITAMIN B12) 500 MCG TABS Take 1 tablet by mouth daily.     desonide (DESOWEN) 0.05 % cream Apply 1 application. topically 2  (two) times daily.     gabapentin (NEURONTIN) 100 MG capsule Take one to two capsules by mouth at night as needed for back pain. 60 capsule 3   hydrochlorothiazide (MICROZIDE) 12.5 MG capsule Take 1 capsule (12.5 mg total) by mouth daily. 90 capsule 3   ketoconazole (NIZORAL) 2 % cream SMARTSIG:1 Application Topical 1 to 2 Times Daily     levETIRAcetam (KEPPRA) 500 MG tablet Take 1 tablet (500 mg total) by mouth 2 (two) times daily. 180 tablet 3   levocetirizine (XYZAL) 5 MG tablet TAKE ONE TABLET BY MOUTH EVERY EVENING 90 tablet 0   Multiple Vitamins-Minerals (CENTRUM SILVER 50+MEN PO) Take 1 tablet by mouth daily as needed.     mupirocin ointment (BACTROBAN) 2 % Apply 1 application. topically 2 (two) times daily. 22 g 0   omeprazole (PRILOSEC) 20 MG capsule Take 1 capsule (20 mg total) by mouth 2 (two) times daily. 180 capsule 0   Oxymetazoline HCl (NASAL SPRAY) 0.05 % SOLN Place into the nose.     traMADol (ULTRAM) 50 MG tablet Take 50 mg by mouth every 6 (six) hours as needed.     VITAMIN D, CHOLECALCIFEROL, PO Take 1 tablet by  mouth every other day. 5000 units every other day     zinc gluconate 50 MG tablet Take 50 mg by mouth daily as needed.     No current facility-administered medications on file prior to visit.    ALLERGIES: No Known Allergies  FAMILY HISTORY: Family History  Problem Relation Age of Onset   Alcohol abuse Father    Hyperlipidemia Brother    Stroke Brother    Diabetes Brother        type ll    SOCIAL HISTORY: Social History   Socioeconomic History   Marital status: Divorced    Spouse name: Not on file   Number of children: 3   Years of education: Not on file   Highest education level: Some college, no degree  Occupational History   Occupation: retired  Tobacco Use   Smoking status: Never   Smokeless tobacco: Never  Vaping Use   Vaping Use: Never used  Substance and Sexual Activity   Alcohol use: Yes    Alcohol/week: 7.0 standard drinks     Types: 7 Glasses of wine per week    Comment: have 1/2 a day; wine or other    Drug use: No   Sexual activity: Not Currently  Other Topics Concern   Not on file  Social History Narrative   Lives alone in one level. Has two sons 1 daughter and friends who serve as support.   Former Chiropodist; Enjoys Data processing manager, reading   Originally from [place]   Right handed    Caffeine 1 cup daily   Social Determinants of Radio broadcast assistant Strain: Low Risk    Difficulty of Paying Living Expenses: Not hard at all  Food Insecurity: No Food Insecurity   Worried About Charity fundraiser in the Last Year: Never true   Arboriculturist in the Last Year: Never true  Transportation Needs: No Transportation Needs   Lack of Transportation (Medical): No   Lack of Transportation (Non-Medical): No  Physical Activity: Sufficiently Active   Days of Exercise per Week: 7 days   Minutes of Exercise per Session: 30 min  Stress: No Stress Concern Present   Feeling of Stress : Not at all  Social Connections: Moderately Integrated   Frequency of Communication with Friends and Family: More than three times a week   Frequency of Social Gatherings with Friends and Family: More than three times a week   Attends Religious Services: More than 4 times per year   Active Member of [employer] or Organizations: Yes   Attends Music therapist: More than 4 times per year   Marital Status: Never married  Human resources officer Violence: Not At Risk   Fear of Current or Ex-Partner: No   Emotionally Abused: No   Physically Abused: No   Sexually Abused: No     PHYSICAL EXAM: Vitals:   07/31/21 1400  BP: (!) 151/77  Pulse: 63  SpO2: 95%   General: No acute distress Head:  Normocephalic/atraumatic Skin/Extremities: No rash, no edema Neurological Exam: alert and awake. No aphasia or dysarthria. Fund of knowledge is appropriate.  Attention and concentration are normal.   Cranial nerves: Pupils equal,  round. Extraocular movements intact with no nystagmus. Visual fields full.  No facial asymmetry.  Motor: Bulk and tone normal, muscle strength 5/5 throughout with no pronator drift.   Finger to nose testing intact.  Gait narrow-based and steady, no ataxia   IMPRESSION: This is a pleasant  81 yo RH man with a history of hypertension, hyperlipidemia, prostate cancer, with 2 episodes of loss of consciousness that occurred in 10/2020 and 03/02/2021. Both occurred while taking a nap after physical activity. He was started on Levetiracetam '500mg'$  BID in the ER after the second event. His 1-hour EEG is normal. He has had a cardiac workup, diagnosed with HCM and atrial fibrillation. He is now on Eliquis. He denies any further episodes of loss of consciousness since 02/2021, no near syncope since 04/2021. We discussed that symptoms are less likely due to epilepsy, however since he has been doing well with no events, we agreed to continue on Levetiracetam until he completes evaluation with Sleep Medicine and 1-year event-free. He is aware of [place] driving laws to stop driving after an episode of loss of consciousness until 6 months event-free.  Follow-up in January 2024.   Thank you for allowing me to participate in his care.  Please do not hesitate to call for any questions or concerns.    Ellouise Newer, M.D.   CC: Dr. Elease Hashimoto, Dr. Terri Skains

## 2021-07-31 NOTE — Patient Instructions (Signed)
Good to see you doing well. Continue Keppra (Levetiracetam) '500mg'$  twice a day. Proceed with Sleep Specialist evaluation and follow-up with Cardiology as scheduled. Follow-up with me in January 2024, call for any changes.

## 2021-08-25 ENCOUNTER — Other Ambulatory Visit: Payer: Self-pay | Admitting: Family Medicine

## 2021-09-04 ENCOUNTER — Encounter: Payer: Self-pay | Admitting: Cardiology

## 2021-09-04 ENCOUNTER — Ambulatory Visit: Payer: PPO | Admitting: Cardiology

## 2021-09-04 VITALS — BP 146/73 | HR 67 | Temp 98.5°F | Resp 16 | Ht 68.0 in | Wt 160.0 lb

## 2021-09-04 DIAGNOSIS — I422 Other hypertrophic cardiomyopathy: Secondary | ICD-10-CM

## 2021-09-04 DIAGNOSIS — I459 Conduction disorder, unspecified: Secondary | ICD-10-CM

## 2021-09-04 DIAGNOSIS — I480 Paroxysmal atrial fibrillation: Secondary | ICD-10-CM | POA: Diagnosis not present

## 2021-09-04 DIAGNOSIS — Z7901 Long term (current) use of anticoagulants: Secondary | ICD-10-CM

## 2021-09-04 DIAGNOSIS — E782 Mixed hyperlipidemia: Secondary | ICD-10-CM

## 2021-09-04 DIAGNOSIS — I10 Essential (primary) hypertension: Secondary | ICD-10-CM

## 2021-09-04 DIAGNOSIS — Z8669 Personal history of other diseases of the nervous system and sense organs: Secondary | ICD-10-CM

## 2021-09-04 MED ORDER — LOSARTAN POTASSIUM 25 MG PO TABS: 25.0000 mg | ORAL_TABLET | Freq: Every morning | ORAL | 0 refills | Status: DC

## 2021-09-04 NOTE — Progress Notes (Signed)
Date:  09/04/2021   ID:  Drew Smith, DOB [birth year], MRN [id number]  PCP:  [name], MD  Cardiologist:  Rex Kras, DO, [facility] (established care 03/15/2021)  Date: 09/04/21 Last Office Visit: 06/05/2021  Chief Complaint  Patient presents with   Loss of Consciousness    Hx    Atrial Fibrillation   Follow-up    3 months    HPI  Drew Smith is a 81 y.o. Caucasian male whose past medical history and cardiovascular risk factors include: History of syncope, conduction disorder, paroxysmal atrial fibrillation, hypertrophic cardiomyopathy (cardiac MRI), HTN, HLD, prostate cancer.  He is referred to the office at the request of [name], MD for evaluation of loss of consciousness.  He had 2 episodes of loss of consciousness (September 2022 in January 2023) and was referred to cardiology thereafter for further work-up.  He underwent cardiovascular testing as noted below.  Notably his extended Holter monitor had episodes of nocturnal pauses and one episode of pause with complete heart block during awakening hours and paroxysmal atrial fibrillation.  He was being considered for pacemaker implantation.  However, had episode of near syncope in March 2023 which resulted in elective hospitalization.  He was evaluated by cardiac electrophysiology inpatient and underwent cardiac MRI and was noted to have asymmetrical septal hypertrophy consistent with HCM.  Cardiac electrophysiology suspected that his episodes of pauses/transient complete heart block likely due to vagal mediated events and therefore patient did not undergo device therapy.  He now presents for 34-monthfollow-up visit.  He denies angina pectoris or heart failure symptoms.  He currently takes his blood pressure pills as needed.  He was started on amlodipine in the past and due to lower extremity swelling started on hydrochlorothiazide.  Given his HCM would recommend titration of antihypertensive medications.   He continues to play golf 3 times a week without any exertional symptoms of angina.  No recurrence of syncope.  With regards to his history of paroxysmal A-fib he is tolerating medical therapy well.  He does not endorse evidence of bleeding or falls.  FUNCTIONAL STATUS: Golfing 3 times a week.    ALLERGIES: No Known Allergies  MEDICATION LIST PRIOR TO VISIT: Current Meds  Medication Sig   amLODipine (NORVASC) 5 MG tablet Take 1 tablet (5 mg total) by mouth daily.   Cyanocobalamin (VITAMIN B12) 500 MCG TABS Take 1 tablet by mouth daily.   desonide (DESOWEN) 0.05 % cream Apply 1 application. topically 2 (two) times daily.   ketoconazole (NIZORAL) 2 % cream SMARTSIG:1 Application Topical 1 to 2 Times Daily   levETIRAcetam (KEPPRA) 500 MG tablet Take 1 tablet (500 mg total) by mouth 2 (two) times daily.   losartan (COZAAR) 25 MG tablet Take 1 tablet (25 mg total) by mouth every morning.   mupirocin ointment (BACTROBAN) 2 % Apply 1 application. topically 2 (two) times daily.   omeprazole (PRILOSEC) 20 MG capsule Take 1 capsule (20 mg total) by mouth 2 (two) times daily.   Oxymetazoline HCl (NASAL SPRAY) 0.05 % SOLN Place into the nose.   traMADol (ULTRAM) 50 MG tablet Take 50 mg by mouth every 6 (six) hours as needed.   VITAMIN D, CHOLECALCIFEROL, PO Take 1 tablet by mouth every other day. 5000 units every  3 day   zinc gluconate 50 MG tablet Take 50 mg by mouth daily as needed.   [DISCONTINUED] hydrochlorothiazide (MICROZIDE) 12.5 MG capsule TAKE ONE CAPSULE BY MOUTH DAILY  PAST MEDICAL HISTORY: Past Medical History:  Diagnosis Date   Arthritis    Asthma    Cancer ([place])    prostate   GERD (gastroesophageal reflux disease)    Guillain-Barre syndrome ([place]) 02/25/1998   Hyperlipidemia    Hypertension    Seizures ([place])     PAST SURGICAL HISTORY: Past Surgical History:  Procedure Laterality Date   CATARACT EXTRACTION     EYE SURGERY  02/26/1943   81 years old   HERNIA REPAIR   02/25/1993   NOSE SURGERY  02/26/1988   PROSTATE SURGERY  02/25/1998   seed inplants   SHOULDER SURGERY  02/25/2006    FAMILY HISTORY: The patient family history includes Alcohol abuse in his father; Diabetes in his brother; Hyperlipidemia in his brother; Stroke in his brother.  SOCIAL HISTORY:  The patient  reports that he has never smoked. He has never used smokeless tobacco. He reports current alcohol use of about 7.0 standard drinks of alcohol per week. He reports that he does not use drugs.  REVIEW OF SYSTEMS: Review of Systems  Constitutional: Positive for malaise/fatigue (improving).  Cardiovascular:  Negative for chest pain, dyspnea on exertion, leg swelling, near-syncope, orthopnea, palpitations, paroxysmal nocturnal dyspnea and syncope.  Respiratory:  Negative for shortness of breath.   Hematologic/Lymphatic: Negative for bleeding problem.    PHYSICAL EXAM:    09/04/2021    1:42 PM 07/31/2021    2:00 PM 07/24/2021   11:03 AM  Vitals with BMI  Height '5\' 8"'$  '5\' 8"'$  '5\' 8"'$   Weight 160 lbs 159 lbs 163 lbs 14 oz  BMI 24.33 17.40 81.44  Systolic 818 563 149  Diastolic 73 77 60  Pulse 67 63 67   Orthostatic VS for the past 72 hrs (Last 3 readings):  Orthostatic BP Patient Position BP Location Cuff Size Orthostatic Pulse  09/04/21 1353 172/84 Standing Left Arm Normal 66  09/04/21 1352 152/75 Sitting Left Arm Normal 62  09/04/21 1351 147/78 Supine Left Arm Normal 72   CONSTITUTIONAL: Well-developed and well-nourished. No acute distress.  SKIN: Skin is warm and dry. No rash noted. No cyanosis. No pallor. No jaundice HEAD: Normocephalic and atraumatic.  EYES: No scleral icterus MOUTH/THROAT: Moist oral membranes.  NECK: No JVD present. No thyromegaly noted. No carotid bruits  CHEST Normal respiratory effort. No intercostal retractions  LUNGS: Clear to auscultation bilaterally.  No stridor. No wheezes. No rales.  CARDIOVASCULAR: Regular rate and rhythm, positive S1-S2, no  murmurs rubs or gallops appreciated. ABDOMINAL: Soft, nontender, nondistended, positive bowel sounds in all 4 quadrants, no apparent ascites.  EXTREMITIES: No peripheral edema, warm to touch, 2+ bilateral DP and PT pulses HEMATOLOGIC: No significant bruising NEUROLOGIC: Oriented to person, place, and time. Nonfocal. Normal muscle tone.  PSYCHIATRIC: Normal mood and affect. Normal behavior. Cooperative  CARDIAC DATABASE: EKG: 09/04/2021: Normal sinus rhythm, 61 bpm, left axis, left anterior fascicular block consider old lateral infarct.  Echocardiogram: 03/27/2021: Study Quality: Technically difficult study. Normal LV systolic function with visual EF 60-65%. Left ventricle cavity is normal in size. Moderate left ventricular hypertrophy, prominent basal septum (IVSd1.67cm and LVPWd 1.23cm). Normal global wall motion. Normal diastolic filling pattern, normal LAP.  Trace aortic regurgitation. Trace tricuspid regurgitation. No evidence of pulmonary hypertension. No prior study for comparison.   Stress Testing: Exercise treadmill stress test 04/13/2021: Exercise treadmill stress test performed using Bruce protocol. Patient reached 4.6 METS, and 87% of age predicted maximum heart rate. Exercise capacity was low. No chest pain reported. Normal heart  rate response with hypertensive exercise response. Peak BP 220/80 mmHg. Stress EKG shows no ischemic changes. Low risk study.  Heart Catheterization: None  Carotid artery duplex 03/27/2021: Duplex suggests stenosis in the right internal carotid artery (1-15%). Duplex suggests stenosis in the left internal carotid artery (1-15%). Mild diffuse homogeneous plaque noted in bilateral carotid arteries. Left carotid exhibits tortuosity and may be a source of bruit.  Antegrade right vertebral artery flow. Antegrade left vertebral artery flow. Follow up studies if clinically indicated.  Cardiac MRI 05/07/2021: 1. Asymmetric septal hypertrophy measuring up  to 34m in basal septum (178min posterior wall), meeting criteria for hypertrophic cardiomyopathy   2. Patchy late gadolinium enhancement in basal septum and lateral wall, consistent with HCM. LGE accounts for 5% of total myocardial mass   3.  Normal LV size and systolic function (EF 5436%  4.  Normal RV size and systolic function (EF 5564% LABORATORY DATA:    Latest Ref Rng & Units 05/06/2021   12:08 AM 05/04/2021    8:18 PM 03/02/2021   11:39 PM  CBC  WBC 4.0 - 10.5 K/uL 8.3  6.8  11.1   Hemoglobin 13.0 - 17.0 g/dL 13.7  14.3  14.9   Hematocrit 39.0 - 52.0 % 40.3  40.4  43.2   Platelets 150 - 400 K/uL 242  240  261        Latest Ref Rng & Units 05/15/2021   12:02 PM 05/06/2021   12:08 AM 05/05/2021    2:39 AM  CMP  Glucose 70 - 99 mg/dL 146  136  93   BUN 6 - 23 mg/dL '17  17  17   '$ Creatinine 0.40 - 1.50 mg/dL 1.07  1.28  1.00   Sodium 135 - 145 mEq/L 135  133  138   Potassium 3.5 - 5.1 mEq/L 4.1  3.3  4.3   Chloride 96 - 112 mEq/L 101  101  103   CO2 19 - 32 mEq/L '27  25  27   '$ Calcium 8.4 - 10.5 mg/dL 9.5  8.9  9.2     Lipid Panel     Component Value Date/Time   CHOL 163 04/05/2019 1018   TRIG 103.0 04/05/2019 1018   HDL 47.60 04/05/2019 1018   CHOLHDL 3 04/05/2019 1018   VLDL 20.6 04/05/2019 1018   LDLCALC 95 04/05/2019 1018    No components found for: "NTPROBNP" No results for input(s): "PROBNP" in the last 8760 hours. No results for input(s): "TSH" in the last 8760 hours.   BMP Recent Labs    05/04/21 2018 05/05/21 0239 05/06/21 0008 05/15/21 1202  NA 134* 138 133* 135  K 3.8 4.3 3.3* 4.1  CL 100 103 101 101  CO2 '24 27 25 27  '$ GLUCOSE 196* 93 136* 146*  BUN '19 17 17 17  '$ CREATININE 1.13 1.00 1.28* 1.07  CALCIUM 8.8* 9.2 8.9 9.5  GFRNONAA >60 >60 56*  --     HEMOGLOBIN A1C Lab Results  Component Value Date   HGBA1C 5.5 05/15/2021    IMPRESSION:    ICD-10-CM   1. Paroxysmal atrial fibrillation (HCC)  I48.0 EKG 12-Lead    2. Long term  (current) use of anticoagulants  Z79.01 Hemoglobin and hematocrit, blood    3. Conduction disorder of the heart  I45.9     4. Mixed hyperlipidemia  E78.2     5. Hypertrophic cardiomyopathy (HCCanova asymmetric septal hypertrophy  I42.2 losartan (COZAAR) 25 MG tablet  6. Benign hypertension  I10 losartan (COZAAR) 25 MG tablet    Basic metabolic panel    7. History of Guillain-Barre syndrome  Z86.69         RECOMMENDATIONS: Drew Smith is a 81 y.o. Caucasian male whose past medical history and cardiac risk factors include: History of syncope, conduction disorder, paroxysmal atrial fibrillation, hypertrophic cardiomyopathy (cardiac MRI), HTN, HLD, prostate cancer.  Paroxysmal atrial fibrillation (HCC) Rate control: NA Rhythm control: NA Thromboembolic prophylaxis: Eliquis CHA2DS2-VASc SCORE is 3 which correlates to 3.2% risk of stroke per year. Given his findings of nocturnal pauses and transient complete heart block we will avoid AV nodal blocking agents for now until he has been evaluated for sleep apnea or has episodes of A-fib with RVR.  Monitor for now.  Long term (current) use of anticoagulants Indication: Paroxysmal atrial fibrillation. Does not endorse evidence of bleeding. Reemphasized the risks, benefits, and alternatives to oral anticoagulation. Check hemoglobin and hematocrit.  Conduction disorder of the heart During the work-up of syncope he had a extended Holter monitor which noted episodes of nocturnal pauses and transient complete heart block.  He was initially being scheduled/considered for pacemaker; however, cardiac MRI findings consistent with asymmetrical septal hypertrophy meeting the criteria for HCM.  It was felt that the symptoms that he was experiencing was likely secondary to vagal mediated events and therefore pacemaker has been postponed. In the past patient was recommended to undergo loop recorder implantation to monitor for conduction disease;  however, wanted to hold off.  Hypertrophic cardiomyopathy ([place]), asymmetric septal hypertrophy Diagnosed on cardiac MRI March 2023. Not suggestive of obstructive disease on echocardiogram. Currently not on AV nodal blocking agents for reasons mentioned above. Discontinue hydrochlorothiazide to prevent hyperdynamic LV.  Start losartan 25 mg p.o. every morning.  Labs in 1 week to evaluate kidney function. Patient is asked to keep a log of his blood pressures and pulse and to drop it off in 2 weeks.  If there is room to uptitrate antihypertensive medications will proceed forward.  FINAL MEDICATION LIST END OF ENCOUNTER: Meds ordered this encounter  Medications   losartan (COZAAR) 25 MG tablet    Sig: Take 1 tablet (25 mg total) by mouth every morning.    Dispense:  30 tablet    Refill:  0    Medications Discontinued During This Encounter  Medication Reason   gabapentin (NEURONTIN) 100 MG capsule    hydrochlorothiazide (MICROZIDE) 12.5 MG capsule Change in therapy      Current Outpatient Medications:    amLODipine (NORVASC) 5 MG tablet, Take 1 tablet (5 mg total) by mouth daily., Disp: 90 tablet, Rfl: 3   Cyanocobalamin (VITAMIN B12) 500 MCG TABS, Take 1 tablet by mouth daily., Disp: , Rfl:    desonide (DESOWEN) 0.05 % cream, Apply 1 application. topically 2 (two) times daily., Disp: , Rfl:    ketoconazole (NIZORAL) 2 % cream, SMARTSIG:1 Application Topical 1 to 2 Times Daily, Disp: , Rfl:    levETIRAcetam (KEPPRA) 500 MG tablet, Take 1 tablet (500 mg total) by mouth 2 (two) times daily., Disp: 180 tablet, Rfl: 3   losartan (COZAAR) 25 MG tablet, Take 1 tablet (25 mg total) by mouth every morning., Disp: 30 tablet, Rfl: 0   mupirocin ointment (BACTROBAN) 2 %, Apply 1 application. topically 2 (two) times daily., Disp: 22 g, Rfl: 0   omeprazole (PRILOSEC) 20 MG capsule, Take 1 capsule (20 mg total) by mouth 2 (two) times daily., Disp: 180 capsule, Rfl: 0  Oxymetazoline HCl (NASAL SPRAY)  0.05 % SOLN, Place into the nose., Disp: , Rfl:    traMADol (ULTRAM) 50 MG tablet, Take 50 mg by mouth every 6 (six) hours as needed., Disp: , Rfl:    VITAMIN D, CHOLECALCIFEROL, PO, Take 1 tablet by mouth every other day. 5000 units every  3 day, Disp: , Rfl:    zinc gluconate 50 MG tablet, Take 50 mg by mouth daily as needed., Disp: , Rfl:    apixaban (ELIQUIS) 5 MG TABS tablet, Take 1 tablet (5 mg total) by mouth 2 (two) times daily. (Patient taking differently: Take 5 mg by mouth daily.), Disp: 60 tablet, Rfl: 2   levocetirizine (XYZAL) 5 MG tablet, TAKE ONE TABLET BY MOUTH EVERY EVENING (Patient not taking: Reported on 09/04/2021), Disp: 90 tablet, Rfl: 0   Multiple Vitamins-Minerals (CENTRUM SILVER 50+MEN PO), Take 1 tablet by mouth daily as needed. (Patient not taking: Reported on 07/31/2021), Disp: , Rfl:   Orders Placed This Encounter  Procedures   Basic metabolic panel   Hemoglobin and hematocrit, blood   EKG 12-Lead    There are no Patient Instructions on file for this visit.   --Continue cardiac medications as reconciled in final medication list. --Return in about 6 months (around 03/07/2022) for Follow up PAFib and HCM. . Or sooner if needed. --Continue follow-up with your primary care physician regarding the management of your other chronic comorbid conditions.  Patient's questions and concerns were addressed to his satisfaction. He voices understanding of the instructions provided during this encounter.   This note was created using a voice recognition software as a result there may be grammatical errors inadvertently enclosed that do not reflect the nature of this encounter. Every attempt is made to correct such errors.  Rex Kras, [place], [facility]  Pager: [phone] Office: [phone]

## 2021-09-13 DIAGNOSIS — L814 Other melanin hyperpigmentation: Secondary | ICD-10-CM | POA: Diagnosis not present

## 2021-09-13 DIAGNOSIS — D225 Melanocytic nevi of trunk: Secondary | ICD-10-CM | POA: Diagnosis not present

## 2021-09-13 DIAGNOSIS — L239 Allergic contact dermatitis, unspecified cause: Secondary | ICD-10-CM | POA: Diagnosis not present

## 2021-09-13 DIAGNOSIS — L821 Other seborrheic keratosis: Secondary | ICD-10-CM | POA: Diagnosis not present

## 2021-09-13 DIAGNOSIS — Z7901 Long term (current) use of anticoagulants: Secondary | ICD-10-CM | POA: Diagnosis not present

## 2021-09-13 DIAGNOSIS — C44329 Squamous cell carcinoma of skin of other parts of face: Secondary | ICD-10-CM | POA: Diagnosis not present

## 2021-09-13 DIAGNOSIS — L989 Disorder of the skin and subcutaneous tissue, unspecified: Secondary | ICD-10-CM | POA: Diagnosis not present

## 2021-09-13 DIAGNOSIS — L570 Actinic keratosis: Secondary | ICD-10-CM | POA: Diagnosis not present

## 2021-09-14 LAB — HEMOGLOBIN AND HEMATOCRIT, BLOOD
Hematocrit: 41.6 % (ref 37.5–51.0)
Hemoglobin: 14 g/dL (ref 13.0–17.7)

## 2021-09-17 NOTE — Progress Notes (Signed)
Pt called back due to a miss call. Informed pt about the lab results. Pt understood. Pt mention you changes his bp medication to losartan '25mg'$  daily. Pt mention his bp yesterday morning was 179/69 58, yesterday afternoon was 145/68 64, and last night it was 135/71 63. This morning it was 179/70

## 2021-09-17 NOTE — Progress Notes (Signed)
Called pt no answer, let a vm

## 2021-09-20 ENCOUNTER — Telehealth: Payer: Self-pay

## 2021-09-20 ENCOUNTER — Ambulatory Visit: Payer: PPO | Admitting: Internal Medicine

## 2021-09-20 NOTE — Telephone Encounter (Signed)
Pt is seeing Cardiologist at 2:30pm so if this appt is not needed pt will call; allow cancel with no charge.//CL

## 2021-09-20 NOTE — Telephone Encounter (Signed)
Caller states he had a medication for blood pressure and his blood pressure continue to rise. Losartan '25mg'$  (new) Amlodipine 5 mg, HCTZ (old). His blood pressure currently 187/85. He also has a call out for his Cardiologist. --Caller states he had a medication for blood pressure and his blood pressure continue to rise. Losartan '25mg'$  (new) for about last 9 days per Cardiologist. Amlodipine 5 mg, HCTZ (old) and the HCTZ. His blood pressure currently 187/85. Has been checking 3x/day. He also has a call out for his Cardiologist.  09/20/2021 8:46:47 AM See PCP within 24 Hours Alvis Lemmings, RN, Marcie Bal  Referrals REFERRED TO PCP OFFICE Warm transfer to backline  09/20/21 1018 - Pt has appt with Dr Jerilee Hoh today at 3:30. Upon review of chart, pt has cardiologist last appt with them 09/04/21. Discontinue hydrochlorothiazide to prevent hyperdynamic LV.  Start losartan 25 mg p.o. every morning.  Labs in 1 week to evaluate kidney function. Patient is asked to keep a log of his blood pressures and pulse and to drop it off in 2 weeks.  If there is room to uptitrate antihypertensive medications will proceed forward.  Result note from 09/13/21: Pt called back due to a miss call. Informed pt about the lab results. Pt understood. Pt mention you changes his bp medication to losartan '25mg'$  daily. Pt mention his bp yesterday morning was 179/69 58, yesterday afternoon was 145/68 64, and last night it was 135/71 63. This morning it was 179/70  Pt advised that since his cardiologist is managing her antihypertensives it would be better to have appt with him vs Dr Jerilee Hoh (who is not his PCP). Will speak with provider for best recommendation & return call to patient. Patient states he awaiting a call from cardiologist.

## 2021-09-25 ENCOUNTER — Encounter: Payer: Self-pay | Admitting: Cardiology

## 2021-09-25 ENCOUNTER — Ambulatory Visit: Payer: PPO | Admitting: Cardiology

## 2021-09-25 VITALS — BP 145/68 | HR 67 | Resp 16 | Ht 68.0 in | Wt 160.0 lb

## 2021-09-25 DIAGNOSIS — Z7901 Long term (current) use of anticoagulants: Secondary | ICD-10-CM | POA: Diagnosis not present

## 2021-09-25 DIAGNOSIS — Z8669 Personal history of other diseases of the nervous system and sense organs: Secondary | ICD-10-CM

## 2021-09-25 DIAGNOSIS — E782 Mixed hyperlipidemia: Secondary | ICD-10-CM | POA: Diagnosis not present

## 2021-09-25 DIAGNOSIS — I480 Paroxysmal atrial fibrillation: Secondary | ICD-10-CM

## 2021-09-25 DIAGNOSIS — I422 Other hypertrophic cardiomyopathy: Secondary | ICD-10-CM | POA: Diagnosis not present

## 2021-09-25 DIAGNOSIS — I10 Essential (primary) hypertension: Secondary | ICD-10-CM

## 2021-09-25 MED ORDER — DILTIAZEM HCL ER BEADS 120 MG PO CP24: 120.0000 mg | ORAL_CAPSULE | Freq: Every morning | ORAL | 0 refills | Status: DC

## 2021-09-25 MED ORDER — LOSARTAN POTASSIUM 50 MG PO TABS: 50.0000 mg | ORAL_TABLET | Freq: Every evening | ORAL | 0 refills | Status: DC

## 2021-09-25 NOTE — Progress Notes (Signed)
Date:  09/25/2021   ID:  Drew Smith, DOB 1940-10-05, MRN [id number]  PCP:  Drew Post, MD  Cardiologist:  Rex Kras, DO, [facility] (established care 03/15/2021)  Date: 09/25/21 Last Office Visit: 09/04/2021  Chief Complaint  Patient presents with   Atrial Fibrillation   Hypertension    HPI  Drew Smith is a 81 y.o. Caucasian male whose past medical history and cardiovascular risk factors include: History of syncope, conduction disorder, paroxysmal atrial fibrillation, hypertrophic cardiomyopathy (cardiac MRI), HTN, HLD, prostate cancer.  He is referred to the office at the request of Drew Post, MD for evaluation of loss of consciousness.  He had 2 episodes of loss of consciousness (September 2022 in January 2023) and was referred to cardiology thereafter for further work-up.  He underwent cardiovascular testing as noted below.  Notably his extended Holter monitor had episodes of nocturnal pauses and one episode of pause with complete heart block during awakening hours and paroxysmal atrial fibrillation.  He was being considered for pacemaker implantation.  However, had episode of near syncope in March 2023 which resulted in elective hospitalization.  He was evaluated by cardiac electrophysiology inpatient and underwent cardiac MRI and was noted to have asymmetrical septal hypertrophy consistent with HCM.  Cardiac electrophysiology suspected that his episodes of pauses/transient complete heart block likely due to vagal mediated events and therefore patient did not undergo device therapy.  At the last office visit recommended changing his antihypertensive medication given the septal hypertrophy consistent HCM. He was recommended to discontinue HCTZ, reduce Norvasc to '5mg'$  po qday,  and started on Losartan. With close follow up in two weeks.   Since the last visit he has stopped both HCTZ and Norvasc and based on his BP log the mornings are between 150-170 mmHG and  evening readings are greater than 140 mmHG. He continues to play golf 3 times a week without any exertional symptoms of angina.  No recurrence of syncope. With regards to paroxysmal A-fib he is tolerating medical therapy well.  He does not endorse evidence of bleeding or falls.  FUNCTIONAL STATUS: Golfing 3 times a week.    ALLERGIES: No Known Allergies  MEDICATION LIST PRIOR TO VISIT: Current Meds  Medication Sig   apixaban (ELIQUIS) 5 MG TABS tablet Take 1 tablet (5 mg total) by mouth 2 (two) times daily. (Patient taking differently: Take 5 mg by mouth daily.)   Cyanocobalamin (VITAMIN B12) 500 MCG TABS Take 1 tablet by mouth daily.   diltiazem (TIAZAC) 120 MG 24 hr capsule Take 1 capsule (120 mg total) by mouth every morning.   ketoconazole (NIZORAL) 2 % cream SMARTSIG:1 Application Topical 1 to 2 Times Daily   levETIRAcetam (KEPPRA) 500 MG tablet Take 1 tablet (500 mg total) by mouth 2 (two) times daily.   levocetirizine (XYZAL) 5 MG tablet TAKE ONE TABLET BY MOUTH EVERY EVENING   omeprazole (PRILOSEC) 20 MG capsule Take 1 capsule (20 mg total) by mouth 2 (two) times daily.   Oxymetazoline HCl (NASAL SPRAY) 0.05 % SOLN Place into the nose.   traMADol (ULTRAM) 50 MG tablet Take 50 mg by mouth every 6 (six) hours as needed.   VITAMIN D, CHOLECALCIFEROL, PO Take 1 tablet by mouth every other day. 5000 units every  3 day   zinc gluconate 50 MG tablet Take 50 mg by mouth daily as needed.   [DISCONTINUED] amLODipine (NORVASC) 5 MG tablet Take 1 tablet (5 mg total) by mouth daily.   [DISCONTINUED]  hydrochlorothiazide (MICROZIDE) 12.5 MG capsule Take 12.5 mg by mouth daily.   [DISCONTINUED] losartan (COZAAR) 25 MG tablet Take 1 tablet (25 mg total) by mouth every morning.     PAST MEDICAL HISTORY: Past Medical History:  Diagnosis Date   Arthritis    Asthma    Cancer ([place])    prostate   GERD (gastroesophageal reflux disease)    Guillain-Barre syndrome ([place]) 02/25/1998   Hyperlipidemia     Hypertension    Seizures ([place])     PAST SURGICAL HISTORY: Past Surgical History:  Procedure Laterality Date   CATARACT EXTRACTION     EYE SURGERY  02/26/1943   81 years old   HERNIA REPAIR  02/25/1993   NOSE SURGERY  02/26/1988   PROSTATE SURGERY  02/25/1998   seed inplants   SHOULDER SURGERY  02/25/2006    FAMILY HISTORY: The patient family history includes Alcohol abuse in his father; Diabetes in his brother; Hyperlipidemia in his brother; Stroke in his brother.  SOCIAL HISTORY:  The patient  reports that he has never smoked. He has never used smokeless tobacco. He reports current alcohol use of about 7.0 standard drinks of alcohol per week. He reports that he does not use drugs.  REVIEW OF SYSTEMS: Review of Systems  Cardiovascular:  Negative for chest pain, dyspnea on exertion, leg swelling, near-syncope, orthopnea, palpitations, paroxysmal nocturnal dyspnea and syncope.  Respiratory:  Negative for shortness of breath.   Hematologic/Lymphatic: Negative for bleeding problem.   PHYSICAL EXAM:    09/25/2021   10:50 AM 09/04/2021    1:42 PM 07/31/2021    2:00 PM  Vitals with BMI  Height '5\' 8"'$  '5\' 8"'$  '5\' 8"'$   Weight 160 lbs 160 lbs 159 lbs  BMI 24.33 47.82 95.62  Systolic 130 865 784  Diastolic 68 73 77  Pulse 67 67 63   CONSTITUTIONAL: Well-developed and well-nourished. No acute distress.  SKIN: Skin is warm and dry. No rash noted. No cyanosis. No pallor. No jaundice HEAD: Normocephalic and atraumatic.  EYES: No scleral icterus MOUTH/THROAT: Moist oral membranes.  NECK: No JVD present. No thyromegaly noted. No carotid bruits  CHEST Normal respiratory effort. No intercostal retractions  LUNGS: Clear to auscultation bilaterally.  No stridor. No wheezes. No rales.  CARDIOVASCULAR: Regular rate and rhythm, positive S1-S2, no murmurs rubs or gallops appreciated. ABDOMINAL: Soft, nontender, nondistended, positive bowel sounds in all 4 quadrants, no apparent ascites.   EXTREMITIES: No peripheral edema, warm to touch, 2+ bilateral DP and PT pulses HEMATOLOGIC: No significant bruising NEUROLOGIC: Oriented to person, place, and time. Nonfocal. Normal muscle tone.  PSYCHIATRIC: Normal mood and affect. Normal behavior. Cooperative No change in physical examination since last office visit.  CARDIAC DATABASE: EKG: 09/04/2021: Normal sinus rhythm, 61 bpm, left axis, left anterior fascicular block consider old lateral infarct.  Echocardiogram: 03/27/2021: Study Quality: Technically difficult study. Normal LV systolic function with visual EF 60-65%. Left ventricle cavity is normal in size. Moderate left ventricular hypertrophy, prominent basal septum (IVSd1.67cm and LVPWd 1.23cm). Normal global wall motion. Normal diastolic filling pattern, normal LAP.  Trace aortic regurgitation. Trace tricuspid regurgitation. No evidence of pulmonary hypertension. No prior study for comparison.   Stress Testing: Exercise treadmill stress test 04/13/2021: Exercise treadmill stress test performed using Bruce protocol. Patient reached 4.6 METS, and 87% of age predicted maximum heart rate. Exercise capacity was low. No chest pain reported. Normal heart rate response with hypertensive exercise response. Peak BP 220/80 mmHg. Stress EKG shows no ischemic changes. Low  risk study.  Heart Catheterization: None  Carotid artery duplex 03/27/2021: Duplex suggests stenosis in the right internal carotid artery (1-15%). Duplex suggests stenosis in the left internal carotid artery (1-15%). Mild diffuse homogeneous plaque noted in bilateral carotid arteries. Left carotid exhibits tortuosity and may be a source of bruit.  Antegrade right vertebral artery flow. Antegrade left vertebral artery flow. Follow up studies if clinically indicated.  Cardiac MRI 05/07/2021: 1. Asymmetric septal hypertrophy measuring up to 107m in basal septum (185min posterior wall), meeting criteria for  hypertrophic cardiomyopathy   2. Patchy late gadolinium enhancement in basal septum and lateral wall, consistent with HCM. LGE accounts for 5% of total myocardial mass   3.  Normal LV size and systolic function (EF 5474%  4.  Normal RV size and systolic function (EF 5582% LABORATORY DATA:    Latest Ref Rng & Units 09/13/2021   12:31 PM 05/06/2021   12:08 AM 05/04/2021    8:18 PM  CBC  WBC 4.0 - 10.5 K/uL  8.3  6.8   Hemoglobin 13.0 - 17.7 g/dL 14.0  13.7  14.3   Hematocrit 37.5 - 51.0 % 41.6  40.3  40.4   Platelets 150 - 400 K/uL  242  240        Latest Ref Rng & Units 05/15/2021   12:02 PM 05/06/2021   12:08 AM 05/05/2021    2:39 AM  CMP  Glucose 70 - 99 mg/dL 146  136  93   BUN 6 - 23 mg/dL '17  17  17   '$ Creatinine 0.40 - 1.50 mg/dL 1.07  1.28  1.00   Sodium 135 - 145 mEq/L 135  133  138   Potassium 3.5 - 5.1 mEq/L 4.1  3.3  4.3   Chloride 96 - 112 mEq/L 101  101  103   CO2 19 - 32 mEq/L '27  25  27   '$ Calcium 8.4 - 10.5 mg/dL 9.5  8.9  9.2     Lipid Panel     Component Value Date/Time   CHOL 163 04/05/2019 1018   TRIG 103.0 04/05/2019 1018   HDL 47.60 04/05/2019 1018   CHOLHDL 3 04/05/2019 1018   VLDL 20.6 04/05/2019 1018   LDLCALC 95 04/05/2019 1018    No components found for: "NTPROBNP" No results for input(s): "PROBNP" in the last 8760 hours. No results for input(s): "TSH" in the last 8760 hours.   BMP Recent Labs    05/04/21 2018 05/05/21 0239 05/06/21 0008 05/15/21 1202  NA 134* 138 133* 135  K 3.8 4.3 3.3* 4.1  CL 100 103 101 101  CO2 '24 27 25 27  '$ GLUCOSE 196* 93 136* 146*  BUN '19 17 17 17  '$ CREATININE 1.13 1.00 1.28* 1.07  CALCIUM 8.8* 9.2 8.9 9.5  GFRNONAA >60 >60 56*  --     HEMOGLOBIN A1C Lab Results  Component Value Date   HGBA1C 5.5 05/15/2021    IMPRESSION:    ICD-10-CM   1. Benign hypertension  I10 losartan (COZAAR) 50 MG tablet    Ambulatory referral to Sleep Studies    Basic metabolic panel    2. Hypertrophic cardiomyopathy  (HCC), asymmetric septal hypertrophy  I42.2 diltiazem (TIAZAC) 120 MG 24 hr capsule    losartan (COZAAR) 50 MG tablet    Ambulatory referral to Sleep Studies    3. Paroxysmal atrial fibrillation (HCC)  I48.0 diltiazem (TIAZAC) 120 MG 24 hr capsule    4. Long term (current) use  of anticoagulants  Z79.01     5. Mixed hyperlipidemia  E78.2     6. History of Guillain-Barre syndrome  Z86.69         RECOMMENDATIONS: THEOTIS GERDEMAN is a 81 y.o. Caucasian male whose past medical history and cardiac risk factors include: History of syncope, conduction disorder, paroxysmal atrial fibrillation, hypertrophic cardiomyopathy (cardiac MRI), HTN, HLD, prostate cancer.  Benign hypertension Morning blood pressures remain elevated when compared to evening numbers. Increase losartan to 50 mg p.o. every afternoon. Start diltiazem 120 mg p.o. every morning. We will enroll him into remote patient monitoring for BP as well.  Will refer him to sleep medicine for possible sleep apnea.   Hypertrophic cardiomyopathy ([place]), asymmetric septal hypertrophy Diagnosed cardiac MRI March 2023 Not suggestive of obstructive disease on echocardiogram We will start him on low-dose diltiazem 120 mg p.o. daily with close monitoring of blood pressure and pulse Up titration of losartan as discussed above No recurrence of syncope or near syncope since last office visit  Paroxysmal atrial fibrillation (HCC) Rate control: Diltiazem Rhythm control: N/A Thromboembolic prophylaxis: Eliquis CHA2DS2-VASc SCORE is 3 which correlates to 3.2% risk of stroke per year.  Long term (current) use of anticoagulants Indication: Paroxysmal atrial fibrillation Does not endorse evidence of bleeding. Reemphasized the risks, benefits, and alternatives to oral anticoagulation. Most recent hemoglobin at baseline.   FINAL MEDICATION LIST END OF ENCOUNTER: Meds ordered this encounter  Medications   diltiazem (TIAZAC) 120 MG 24 hr  capsule    Sig: Take 1 capsule (120 mg total) by mouth every morning.    Dispense:  30 capsule    Refill:  0   losartan (COZAAR) 50 MG tablet    Sig: Take 1 tablet (50 mg total) by mouth every evening.    Dispense:  30 tablet    Refill:  0    Medications Discontinued During This Encounter  Medication Reason   Multiple Vitamins-Minerals (CENTRUM SILVER 50+MEN PO)    mupirocin ointment (BACTROBAN) 2 %    desonide (DESOWEN) 0.05 % cream    hydrochlorothiazide (MICROZIDE) 12.5 MG capsule Discontinued by provider   amLODipine (NORVASC) 5 MG tablet Change in therapy   losartan (COZAAR) 25 MG tablet Reorder      Current Outpatient Medications:    apixaban (ELIQUIS) 5 MG TABS tablet, Take 1 tablet (5 mg total) by mouth 2 (two) times daily. (Patient taking differently: Take 5 mg by mouth daily.), Disp: 60 tablet, Rfl: 2   Cyanocobalamin (VITAMIN B12) 500 MCG TABS, Take 1 tablet by mouth daily., Disp: , Rfl:    diltiazem (TIAZAC) 120 MG 24 hr capsule, Take 1 capsule (120 mg total) by mouth every morning., Disp: 30 capsule, Rfl: 0   ketoconazole (NIZORAL) 2 % cream, SMARTSIG:1 Application Topical 1 to 2 Times Daily, Disp: , Rfl:    levETIRAcetam (KEPPRA) 500 MG tablet, Take 1 tablet (500 mg total) by mouth 2 (two) times daily., Disp: 180 tablet, Rfl: 3   levocetirizine (XYZAL) 5 MG tablet, TAKE ONE TABLET BY MOUTH EVERY EVENING, Disp: 90 tablet, Rfl: 0   omeprazole (PRILOSEC) 20 MG capsule, Take 1 capsule (20 mg total) by mouth 2 (two) times daily., Disp: 180 capsule, Rfl: 0   Oxymetazoline HCl (NASAL SPRAY) 0.05 % SOLN, Place into the nose., Disp: , Rfl:    traMADol (ULTRAM) 50 MG tablet, Take 50 mg by mouth every 6 (six) hours as needed., Disp: , Rfl:    VITAMIN D, CHOLECALCIFEROL, PO, Take 1  tablet by mouth every other day. 5000 units every  3 day, Disp: , Rfl:    zinc gluconate 50 MG tablet, Take 50 mg by mouth daily as needed., Disp: , Rfl:    losartan (COZAAR) 50 MG tablet, Take 1 tablet  (50 mg total) by mouth every evening., Disp: 30 tablet, Rfl: 0  Orders Placed This Encounter  Procedures   Ambulatory referral to Sleep Studies    There are no Patient Instructions on file for this visit.   --Continue cardiac medications as reconciled in final medication list. --Return in about 3 months (around 12/26/2021) for Follow up AFib, HCM, and BP. Or sooner if needed. --Continue follow-up with your primary care physician regarding the management of your other chronic comorbid conditions.  Patient's questions and concerns were addressed to his satisfaction. He voices understanding of the instructions provided during this encounter.   This note was created using a voice recognition software as a result there may be grammatical errors inadvertently enclosed that do not reflect the nature of this encounter. Every attempt is made to correct such errors.  Rex Kras, [place], [facility]  Pager: [phone] Office: [phone]

## 2021-10-01 ENCOUNTER — Other Ambulatory Visit: Payer: Self-pay | Admitting: Cardiology

## 2021-10-01 DIAGNOSIS — I422 Other hypertrophic cardiomyopathy: Secondary | ICD-10-CM

## 2021-10-01 DIAGNOSIS — I10 Essential (primary) hypertension: Secondary | ICD-10-CM

## 2021-10-04 DIAGNOSIS — I10 Essential (primary) hypertension: Secondary | ICD-10-CM | POA: Diagnosis not present

## 2021-10-05 LAB — BASIC METABOLIC PANEL
BUN/Creatinine Ratio: 16 (ref 10–24)
BUN: 18 mg/dL (ref 8–27)
CO2: 21 mmol/L (ref 20–29)
Calcium: 9.2 mg/dL (ref 8.6–10.2)
Chloride: 102 mmol/L (ref 96–106)
Creatinine, Ser: 1.1 mg/dL (ref 0.76–1.27)
Glucose: 99 mg/dL (ref 70–99)
Potassium: 4.7 mmol/L (ref 3.5–5.2)
Sodium: 139 mmol/L (ref 134–144)
eGFR: 67 mL/min/{1.73_m2} (ref >59)

## 2021-10-09 ENCOUNTER — Telehealth: Payer: Self-pay

## 2021-10-09 DIAGNOSIS — I10 Essential (primary) hypertension: Secondary | ICD-10-CM

## 2021-10-09 DIAGNOSIS — I442 Atrioventricular block, complete: Secondary | ICD-10-CM

## 2021-10-09 NOTE — Telephone Encounter (Signed)
Spoke with patient today as 2nd week follow-up from starting RPM. BP has trended downwards but is still elevated after med changes from the last visit (diltiazem '120mg'$  qAM and losartan '50mg'$  qHS (increased from '25mg'$  qAM). Average Systolic BP Level 447.1 mmHg Lowest Systolic BP Level 580 mmHg Highest Systolic BP Level 638 mmHg  Patient has complaints of some dizziness and numbness in his feet/lower legs. Patient reports he would like to try his previous regimen again (HCTZ, amlodipine) as he felt controlled on that regimen. Understandably cannot do this with his HCM and heart block.

## 2021-10-09 NOTE — Telephone Encounter (Signed)
I spoke to him and answered his questions.  Lets talk about the medications tomorrow.   Dr. Terri Skains

## 2021-10-11 ENCOUNTER — Other Ambulatory Visit: Payer: Self-pay

## 2021-10-11 DIAGNOSIS — I442 Atrioventricular block, complete: Secondary | ICD-10-CM

## 2021-10-11 DIAGNOSIS — I10 Essential (primary) hypertension: Secondary | ICD-10-CM

## 2021-10-11 NOTE — Telephone Encounter (Signed)
Spoke with patient. He is increasing his losartan '50mg'$  qHS to '100mg'$  qHS.  BMP ordered for next week to monitor potassium.

## 2021-10-18 DIAGNOSIS — I442 Atrioventricular block, complete: Secondary | ICD-10-CM | POA: Diagnosis not present

## 2021-10-18 DIAGNOSIS — M545 Low back pain, unspecified: Secondary | ICD-10-CM | POA: Diagnosis not present

## 2021-10-18 DIAGNOSIS — I10 Essential (primary) hypertension: Secondary | ICD-10-CM | POA: Diagnosis not present

## 2021-10-18 DIAGNOSIS — M9904 Segmental and somatic dysfunction of sacral region: Secondary | ICD-10-CM | POA: Diagnosis not present

## 2021-10-18 DIAGNOSIS — M5136 Other intervertebral disc degeneration, lumbar region: Secondary | ICD-10-CM | POA: Diagnosis not present

## 2021-10-18 DIAGNOSIS — M9903 Segmental and somatic dysfunction of lumbar region: Secondary | ICD-10-CM | POA: Diagnosis not present

## 2021-10-19 ENCOUNTER — Telehealth: Payer: Self-pay | Admitting: Pharmacist

## 2021-10-19 LAB — BASIC METABOLIC PANEL
BUN/Creatinine Ratio: 18 (ref 10–24)
BUN: 17 mg/dL (ref 8–27)
CO2: 22 mmol/L (ref 20–29)
Calcium: 9.2 mg/dL (ref 8.6–10.2)
Chloride: 101 mmol/L (ref 96–106)
Creatinine, Ser: 0.93 mg/dL (ref 0.76–1.27)
Glucose: 123 mg/dL — ABNORMAL HIGH (ref 70–99)
Potassium: 4.6 mmol/L (ref 3.5–5.2)
Sodium: 140 mmol/L (ref 134–144)
eGFR: 82 mL/min/{1.73_m2} (ref >59)

## 2021-10-19 NOTE — Chronic Care Management (AMB) (Unsigned)
Chronic Care Management Pharmacy Assistant   Name: Drew Smith  MRN: [id number] DOB: 1940-09-17  Reason for Encounter: Disease State / Follow up recent blood pressure readings.   Conditions to be addressed/monitored: HTN  Recent office visits:  07/24/2021 [name] MD - Patient was seen for primary hypertension and additional concerns. Started Mupirocin 2% twice daily. Follow up in 3 months.   Recent consult visits:  09/25/2021 Rex Kras DO (cardiology) - Patient was seen for Benign hypertension and additional concerns. Started Diltiazem 120 mg daily. Increased Losartan 50 mg daily. Discontinued Amlodipine, Desonide, HCTZ, MVI and Mupirocin. Follow up in 3 months.   09/04/2021 Sunit Tolia DO (cardiology) - Patient was seen for Paroxysmal atrial fibrillation and additional concerns. Started Losartan 25 mg daily. Discontinue Gabapentin and HCTZ. Follow up in 6 months.   07/31/2021 Ellouise Newer MD (neurology) - Patient was seen for loss of consciousness. No medication changes. Follow up in 7 months.   Hospital visits:  None  Medications: Outpatient Encounter Medications as of 10/19/2021  Medication Sig   apixaban (ELIQUIS) 5 MG TABS tablet Take 1 tablet (5 mg total) by mouth 2 (two) times daily. (Patient taking differently: Take 5 mg by mouth daily.)   Cyanocobalamin (VITAMIN B12) 500 MCG TABS Take 1 tablet by mouth daily.   diltiazem (TIAZAC) 120 MG 24 hr capsule Take 1 capsule (120 mg total) by mouth every morning.   ketoconazole (NIZORAL) 2 % cream SMARTSIG:1 Application Topical 1 to 2 Times Daily   levETIRAcetam (KEPPRA) 500 MG tablet Take 1 tablet (500 mg total) by mouth 2 (two) times daily.   levocetirizine (XYZAL) 5 MG tablet TAKE ONE TABLET BY MOUTH EVERY EVENING   losartan (COZAAR) 50 MG tablet Take 1 tablet (50 mg total) by mouth every evening. (Patient taking differently: Take 100 mg by mouth every evening.)   omeprazole (PRILOSEC) 20 MG capsule Take 1 capsule (20  mg total) by mouth 2 (two) times daily.   Oxymetazoline HCl (NASAL SPRAY) 0.05 % SOLN Place into the nose.   traMADol (ULTRAM) 50 MG tablet Take 50 mg by mouth every 6 (six) hours as needed.   VITAMIN D, CHOLECALCIFEROL, PO Take 1 tablet by mouth every other day. 5000 units every  3 day   zinc gluconate 50 MG tablet Take 50 mg by mouth daily as needed.   No facility-administered encounter medications on file as of 10/19/2021.   Reviewed chart prior to disease state call. Spoke with patient regarding BP  Recent Office Vitals: BP Readings from Last 3 Encounters:  09/25/21 (!) 145/68  09/04/21 (!) 146/73  07/31/21 (!) 151/77   Pulse Readings from Last 3 Encounters:  09/25/21 67  09/04/21 67  07/31/21 63    Wt Readings from Last 3 Encounters:  09/25/21 160 lb (72.6 kg)  09/04/21 160 lb (72.6 kg)  07/31/21 159 lb (72.1 kg)     Kidney Function Lab Results  Component Value Date/Time   CREATININE 0.93 10/18/2021 10:54 AM   CREATININE 1.10 10/04/2021 02:31 PM   GFR 64.99 05/15/2021 12:02 PM   GFRNONAA 56 (L) 05/06/2021 12:08 AM   GFRAA 124 10/28/2007 12:00 AM       Latest Ref Rng & Units 10/18/2021   10:54 AM 10/04/2021    2:31 PM 05/15/2021   12:02 PM  BMP  Glucose 70 - 99 mg/dL 123  99  146   BUN 8 - 27 mg/dL '17  18  17   '$ Creatinine 0.76 -  1.27 mg/dL 0.93  1.10  1.07   BUN/Creat Ratio 10 - '24 18  16    '$ Sodium 134 - 144 mmol/L 140  139  135   Potassium 3.5 - 5.2 mmol/L 4.6  4.7  4.1   Chloride 96 - 106 mmol/L 101  102  101   CO2 20 - 29 mmol/L '22  21  27   '$ Calcium 8.6 - 10.2 mg/dL 9.2  9.2  9.5    SCHEDULE F/U OCT OR DEC (CARDS OV IN NOV) Current antihypertensive regimen:  Diltiazem 120 mg 1 tablet every morning Losartan 50 mg 1 tablet every evening  How often are you checking your Blood Pressure? {CHL HP BP Monitoring Frequency:[id number]}  Current home BP readings: ***  Has patient been taking Eliquis twice daily as prescribed? ***   Adherence Review: Is the  patient currently on ACE/ARB medication? Yes Does the patient have >5 day gap between last estimated fill dates? No  Care Gaps: AWV - scheduled 07/01/2022 Last BP - 145/68 on 09/25/2021 Covid vaccine - never done Shingrix - never done Pneumonia vaccine - postponed Tdap - postponed  Star Rating Drugs: Losartan 50 mg - last filled 09/25/2021 30 DS at [place] Pharmacist Assistant [phone]

## 2021-10-21 ENCOUNTER — Other Ambulatory Visit: Payer: Self-pay | Admitting: Cardiology

## 2021-10-21 DIAGNOSIS — I10 Essential (primary) hypertension: Secondary | ICD-10-CM

## 2021-10-21 DIAGNOSIS — I422 Other hypertrophic cardiomyopathy: Secondary | ICD-10-CM

## 2021-10-21 DIAGNOSIS — I480 Paroxysmal atrial fibrillation: Secondary | ICD-10-CM

## 2021-10-22 ENCOUNTER — Other Ambulatory Visit: Payer: Self-pay

## 2021-10-22 ENCOUNTER — Telehealth: Payer: Self-pay

## 2021-10-22 DIAGNOSIS — I422 Other hypertrophic cardiomyopathy: Secondary | ICD-10-CM

## 2021-10-22 DIAGNOSIS — I10 Essential (primary) hypertension: Secondary | ICD-10-CM

## 2021-10-22 DIAGNOSIS — I480 Paroxysmal atrial fibrillation: Secondary | ICD-10-CM

## 2021-10-22 MED ORDER — HYDROCHLOROTHIAZIDE 12.5 MG PO CAPS: 12.5000 mg | ORAL_CAPSULE | Freq: Every day | ORAL | 0 refills | Status: DC

## 2021-10-22 MED ORDER — LOSARTAN POTASSIUM 100 MG PO TABS: 100.0000 mg | ORAL_TABLET | Freq: Every evening | ORAL | 5 refills | Status: DC

## 2021-10-22 MED ORDER — DILTIAZEM HCL ER BEADS 120 MG PO CP24: 120.0000 mg | ORAL_CAPSULE | Freq: Every morning | ORAL | 5 refills | Status: DC

## 2021-10-22 NOTE — Telephone Encounter (Signed)
Patient to continue diltiazem '120mg'$  qAM and losartan '100mg'$  qPM (refills sent). Adding trial of HCTZ 12.'5mg'$  qAM for 30d only. BMP ordered for 1 week. Patient aware of changes.  Average Systolic BP Level 720.72 mmHg Lowest Systolic BP Level 182 mmHg Highest Systolic BP Level 883 mmHg

## 2021-10-25 ENCOUNTER — Other Ambulatory Visit: Payer: Self-pay | Admitting: *Deleted

## 2021-10-25 DIAGNOSIS — M545 Low back pain, unspecified: Secondary | ICD-10-CM | POA: Diagnosis not present

## 2021-10-25 DIAGNOSIS — M9903 Segmental and somatic dysfunction of lumbar region: Secondary | ICD-10-CM | POA: Diagnosis not present

## 2021-10-25 DIAGNOSIS — M9904 Segmental and somatic dysfunction of sacral region: Secondary | ICD-10-CM | POA: Diagnosis not present

## 2021-10-25 DIAGNOSIS — M5136 Other intervertebral disc degeneration, lumbar region: Secondary | ICD-10-CM | POA: Diagnosis not present

## 2021-10-25 NOTE — Patient Outreach (Signed)
  Care Coordination   10/25/2021 Name: Drew Smith MRN: [id number] DOB: 08/06/1940   Care Coordination Outreach Attempts:  An unsuccessful telephone outreach was attempted today to offer the patient information about available care coordination services as a benefit of their health plan.   Follow Up Plan:  Additional outreach attempts will be made to offer the patient care coordination information and services.   Encounter Outcome:  No Answer  Care Coordination Interventions Activated:  No   Care Coordination Interventions:  No, not indicated    Raina Mina, RN Care Management Coordinator [place] Office [phone]

## 2021-10-30 DIAGNOSIS — I10 Essential (primary) hypertension: Secondary | ICD-10-CM | POA: Diagnosis not present

## 2021-10-30 DIAGNOSIS — I422 Other hypertrophic cardiomyopathy: Secondary | ICD-10-CM | POA: Diagnosis not present

## 2021-10-31 DIAGNOSIS — I10 Essential (primary) hypertension: Secondary | ICD-10-CM | POA: Diagnosis not present

## 2021-10-31 LAB — BASIC METABOLIC PANEL
BUN/Creatinine Ratio: 15 (ref 10–24)
BUN: 17 mg/dL (ref 8–27)
CO2: 22 mmol/L (ref 20–29)
Calcium: 9.2 mg/dL (ref 8.6–10.2)
Chloride: 97 mmol/L (ref 96–106)
Creatinine, Ser: 1.12 mg/dL (ref 0.76–1.27)
Glucose: 120 mg/dL — ABNORMAL HIGH (ref 70–99)
Potassium: 4.6 mmol/L (ref 3.5–5.2)
Sodium: 133 mmol/L — ABNORMAL LOW (ref 134–144)
eGFR: 66 mL/min/{1.73_m2} (ref >59)

## 2021-11-01 ENCOUNTER — Other Ambulatory Visit: Payer: Self-pay

## 2021-11-01 DIAGNOSIS — I10 Essential (primary) hypertension: Secondary | ICD-10-CM

## 2021-11-01 DIAGNOSIS — I422 Other hypertrophic cardiomyopathy: Secondary | ICD-10-CM

## 2021-11-01 DIAGNOSIS — I480 Paroxysmal atrial fibrillation: Secondary | ICD-10-CM

## 2021-11-01 MED ORDER — DILTIAZEM HCL ER BEADS 120 MG PO CP24: 120.0000 mg | ORAL_CAPSULE | Freq: Every morning | ORAL | 5 refills | Status: DC

## 2021-11-01 MED ORDER — LOSARTAN POTASSIUM 100 MG PO TABS: 100.0000 mg | ORAL_TABLET | Freq: Every evening | ORAL | 5 refills | Status: DC

## 2021-11-16 ENCOUNTER — Telehealth: Payer: Self-pay

## 2021-11-16 NOTE — Telephone Encounter (Signed)
Since the addition of HCTZ 12.'5mg'$  to HTN regimen, BP has improved.    Before HCTZ Average Systolic BP Level 557.32 mmHg Lowest Systolic BP Level 202 mmHg Highest Systolic BP Level 542 mmHg   After HCTZ addition Average Systolic BP Level 706.23 mmHg Lowest Systolic BP Level 762 mmHg Highest Systolic BP Level 831 mmHg  Current regimen: diltiazem '120mg'$  qAM losartan '100mg'$  qPM hydrochlorothiazide 12.'5mg'$  qAM

## 2021-11-20 ENCOUNTER — Encounter: Payer: Self-pay | Admitting: Family Medicine

## 2021-11-20 ENCOUNTER — Ambulatory Visit (INDEPENDENT_AMBULATORY_CARE_PROVIDER_SITE_OTHER): Payer: PPO | Admitting: Family Medicine

## 2021-11-20 VITALS — BP 120/64 | HR 55 | Temp 98.3°F | Wt 156.0 lb

## 2021-11-20 DIAGNOSIS — M545 Low back pain, unspecified: Secondary | ICD-10-CM | POA: Diagnosis not present

## 2021-11-20 DIAGNOSIS — R059 Cough, unspecified: Secondary | ICD-10-CM | POA: Diagnosis not present

## 2021-11-20 DIAGNOSIS — M5136 Other intervertebral disc degeneration, lumbar region: Secondary | ICD-10-CM | POA: Diagnosis not present

## 2021-11-20 DIAGNOSIS — J40 Bronchitis, not specified as acute or chronic: Secondary | ICD-10-CM | POA: Diagnosis not present

## 2021-11-20 DIAGNOSIS — M9903 Segmental and somatic dysfunction of lumbar region: Secondary | ICD-10-CM | POA: Diagnosis not present

## 2021-11-20 DIAGNOSIS — M9904 Segmental and somatic dysfunction of sacral region: Secondary | ICD-10-CM | POA: Diagnosis not present

## 2021-11-20 LAB — POCT INFLUENZA A/B
Influenza A, POC: NEGATIVE
Influenza B, POC: NEGATIVE

## 2021-11-20 LAB — POC COVID19 BINAXNOW: SARS Coronavirus 2 Ag: NEGATIVE

## 2021-11-20 MED ORDER — AZITHROMYCIN 250 MG PO TABS: ORAL_TABLET | ORAL | 0 refills | Status: DC

## 2021-11-20 NOTE — Progress Notes (Signed)
   Subjective:    Patient ID: Drew Smith, male    DOB: February 03, 1941, 81 y.o.   MRN: [id number]  HPI Here for 4 days of chest tightness and a dry cough. No fever or SOB. Drinking fluids.    Review of Systems  Constitutional: Negative.   HENT:  Negative for congestion, ear pain, postnasal drip, sinus pressure and sore throat.   Eyes: Negative.   Respiratory:  Positive for cough and chest tightness. Negative for shortness of breath and wheezing.        Objective:   Physical Exam Constitutional:      Appearance: Normal appearance.  HENT:     Right Ear: Tympanic membrane, ear canal and external ear normal.     Left Ear: Tympanic membrane, ear canal and external ear normal.     Nose: Nose normal.     Mouth/Throat:     Pharynx: Oropharynx is clear.  Eyes:     Conjunctiva/sclera: Conjunctivae normal.  Cardiovascular:     Rate and Rhythm: Normal rate and regular rhythm.     Pulses: Normal pulses.     Heart sounds: Normal heart sounds.  Pulmonary:     Effort: Pulmonary effort is normal.     Breath sounds: Normal breath sounds.  Neurological:     Mental Status: He is alert.           Assessment & Plan:  Bronchitis, treat with a Zpack.  Alysia Penna, MD

## 2021-11-22 DIAGNOSIS — M545 Low back pain, unspecified: Secondary | ICD-10-CM | POA: Diagnosis not present

## 2021-11-22 DIAGNOSIS — M9904 Segmental and somatic dysfunction of sacral region: Secondary | ICD-10-CM | POA: Diagnosis not present

## 2021-11-22 DIAGNOSIS — M9903 Segmental and somatic dysfunction of lumbar region: Secondary | ICD-10-CM | POA: Diagnosis not present

## 2021-11-22 DIAGNOSIS — M5136 Other intervertebral disc degeneration, lumbar region: Secondary | ICD-10-CM | POA: Diagnosis not present

## 2021-11-23 ENCOUNTER — Other Ambulatory Visit: Payer: Self-pay

## 2021-11-23 DIAGNOSIS — I480 Paroxysmal atrial fibrillation: Secondary | ICD-10-CM

## 2021-11-23 MED ORDER — APIXABAN 5 MG PO TABS: 5.0000 mg | ORAL_TABLET | Freq: Two times a day (BID) | ORAL | 2 refills | Status: DC

## 2021-11-26 ENCOUNTER — Other Ambulatory Visit: Payer: Self-pay

## 2021-11-26 DIAGNOSIS — I442 Atrioventricular block, complete: Secondary | ICD-10-CM

## 2021-11-26 DIAGNOSIS — I10 Essential (primary) hypertension: Secondary | ICD-10-CM

## 2021-11-27 ENCOUNTER — Other Ambulatory Visit: Payer: Self-pay

## 2021-11-27 DIAGNOSIS — I442 Atrioventricular block, complete: Secondary | ICD-10-CM

## 2021-11-27 NOTE — Progress Notes (Signed)
Labs to be done 48h from next appointment (11/7) per Dr. Terri Skains

## 2021-11-28 ENCOUNTER — Telehealth: Payer: Self-pay

## 2021-11-28 NOTE — Patient Outreach (Signed)
  Care Coordination   11/28/2021 Name: Drew Smith MRN: [id number] DOB: [birth year]   Care Coordination Outreach Attempts:  A second unsuccessful outreach was attempted today to offer the patient with information about available care coordination services as a benefit of their health plan.     Follow Up Plan:  Additional outreach attempts will be made to offer the patient care coordination information and services.   Encounter Outcome:  No Answer  Care Coordination Interventions Activated:  No   Care Coordination Interventions:  No, not indicated    Peter Garter RN, BSN,CCM, [place] [facility] [phone]

## 2021-11-29 DIAGNOSIS — M9904 Segmental and somatic dysfunction of sacral region: Secondary | ICD-10-CM | POA: Diagnosis not present

## 2021-11-29 DIAGNOSIS — M545 Low back pain, unspecified: Secondary | ICD-10-CM | POA: Diagnosis not present

## 2021-11-29 DIAGNOSIS — M9903 Segmental and somatic dysfunction of lumbar region: Secondary | ICD-10-CM | POA: Diagnosis not present

## 2021-11-29 DIAGNOSIS — M5136 Other intervertebral disc degeneration, lumbar region: Secondary | ICD-10-CM | POA: Diagnosis not present

## 2021-11-30 DIAGNOSIS — I10 Essential (primary) hypertension: Secondary | ICD-10-CM | POA: Diagnosis not present

## 2021-12-03 DIAGNOSIS — M545 Low back pain, unspecified: Secondary | ICD-10-CM | POA: Diagnosis not present

## 2021-12-03 DIAGNOSIS — M5136 Other intervertebral disc degeneration, lumbar region: Secondary | ICD-10-CM | POA: Diagnosis not present

## 2021-12-03 DIAGNOSIS — M9903 Segmental and somatic dysfunction of lumbar region: Secondary | ICD-10-CM | POA: Diagnosis not present

## 2021-12-03 DIAGNOSIS — M9904 Segmental and somatic dysfunction of sacral region: Secondary | ICD-10-CM | POA: Diagnosis not present

## 2021-12-05 ENCOUNTER — Telehealth: Payer: Self-pay

## 2021-12-05 DIAGNOSIS — M545 Low back pain, unspecified: Secondary | ICD-10-CM | POA: Diagnosis not present

## 2021-12-05 DIAGNOSIS — M5136 Other intervertebral disc degeneration, lumbar region: Secondary | ICD-10-CM | POA: Diagnosis not present

## 2021-12-05 DIAGNOSIS — M9904 Segmental and somatic dysfunction of sacral region: Secondary | ICD-10-CM | POA: Diagnosis not present

## 2021-12-05 DIAGNOSIS — M9903 Segmental and somatic dysfunction of lumbar region: Secondary | ICD-10-CM | POA: Diagnosis not present

## 2021-12-05 NOTE — Patient Instructions (Signed)
Visit Information  Thank you for taking time to visit with me today. Please don't hesitate to contact me if I can be of assistance to you.   Following are the goals we discussed today:   Goals Addressed             This Visit's Progress    COMPLETED: Care Coordination Activities - no follow up required       Care Coordination Interventions: Provided education to patient re: Annual Wellness Visit, care coordination services Assessed social determinant of health barriers           If you are experiencing a Mental Health or [place] or need someone to talk to, please call the Suicide and Crisis Lifeline: 988 call the [place] National Suicide Prevention Lifeline: 573-376-4029 or TTY: 8205650675 TTY 959-274-2186) to talk to a trained counselor call 1-800-273-TALK (toll free, 24 hour hotline) go to [facility] [address], [place] [phone]) call 911   The patient verbalized understanding of instructions, educational materials, and care plan provided today and DECLINED offer to receive copy of patient instructions, educational materials, and care plan.   No further follow up required:    Peter Garter RN, Jackquline Denmark, [place] [facility] [phone]

## 2021-12-05 NOTE — Patient Outreach (Signed)
  Care Coordination   Initial Visit Note   12/05/2021 Name: Drew Smith MRN: [id number] DOB: 12/07/1940  Drew Smith is a 81 y.o. year old male who sees Burchette, Alinda Sierras, MD for primary care. I spoke with  Drew Smith by phone today.  What matters to the patients health and wellness today?  No concerns today.  My cough is getting better and my B/P has been good    Goals Addressed             This Visit's Progress    COMPLETED: Care Coordination Activities - no follow up required       Care Coordination Interventions: Provided education to patient re: Annual Wellness Visit, care coordination services Assessed social determinant of health barriers          SDOH assessments and interventions completed:  Yes  SDOH Interventions Today    Flowsheet Row Most Recent Value  SDOH Interventions   Housing Interventions Intervention Not Indicated  Utilities Interventions Intervention Not Indicated        Care Coordination Interventions Activated:  Yes  Care Coordination Interventions:  Yes, provided   Follow up plan: No further intervention required.   Encounter Outcome:  Pt. Visit Completed  Peter Garter RN, BSN,CCM, CDE Care Management Coordinator [place] [facility] [phone]

## 2021-12-17 ENCOUNTER — Telehealth: Payer: Self-pay | Admitting: Pharmacist

## 2021-12-17 NOTE — Chronic Care Management (AMB) (Signed)
    Chronic Care Management Pharmacy Assistant   Name: Drew Smith  MRN: [id number] DOB: 07-Nov-1940  12/18/2021 APPOINTMENT REMINDER  Called Drew Smith, No answer, left message of appointment on 12/18/2021 at 8:30 via telephone visit with Jeni Salles, Pharm D. Notified to have all medications, supplements, blood pressure and/or blood sugar logs available during appointment and to return call if need to reschedule.  Care Gaps: AWV - scheduled 07/01/2022 Last BP - 120/64 on 11/20/2021 Covid - never done Shingrix - never done Tdap - postponed  Star Rating Drug: Losartan 100 mg - last filled 11/29/2021 [facility] with [facility] - previous fill was 11/03/21 for 30 DS  Note:  Last fill dates verified with Callie at [facility] 5 mg - 11/23/2021 30 DS and 09/25/21 for 30 DS. Diltiazem  120 mg - 11/19/2021 30 DS and 09/25/2021 30 DS  HCTZ 12.5 mg - 08/27/2021 90 DS and 05/31/2021 90 DS  Any gaps in medications fill history? No  Gennie Alma [facility]  Catering manager [phone]

## 2021-12-17 NOTE — Progress Notes (Signed)
Chronic Care Management Pharmacy Note  12/18/2021 Name:  Drew Smith MRN:  [id number] DOB:  14-Dec-1940  Summary: BP is mostly at goal < 140/90 per home BP readings Pt is not taking medications as prescribed Per pharmacy fill history, pt is not compliant with most medications   Recommendations/Changes made from today's visit: -Recommend switching Eliquis to Xarelto to improve adherence -Recommended consistent use of BP medications -Recommended setting an alarm or using a pillbox as a back up to avoid missing doses   Plan: Follow up in 4 months  Subjective: Drew Smith is an 81 y.o. year old male who is a primary patient of Burchette, Alinda Sierras, MD.  The CCM team was consulted for assistance with disease management and care coordination needs.    Engaged with patient by telephone for follow up visit in response to provider referral for pharmacy case management and/or care coordination services.   Consent to Services:  The patient was given information about Chronic Care Management services, agreed to services, and gave verbal consent prior to initiation of services.  Please see initial visit note for detailed documentation.   Patient Care Team: [name], MD as PCP - General (Family Medicine) Stephannie Li, [place] (Ophthalmology) Viona Gilmore, [facility] as Pharmacist (Pharmacist) Cameron Sprang, MD as Consulting Physician (Neurology)  Recent office visits: 11/20/21 Alysia Penna, MD: Patient presented for bronchitis.  Prescribed Z-pak.   07/24/2021 [name] MD - Patient was seen for primary hypertension and additional concerns. Started Mupirocin 2% twice daily. Follow up in 3 months.   06/27/21 Rolene Arbour, LPN: Patient presented for AWV.  Recent consult visits: 0/93/11 Haynes Dage, [facility] (cardiology): Patient had telephone encounter for BP follow up.   04/12/22 Haynes Dage, [facility] (cardiology): Patient had telephone encounter for BP follow up.  Continue current regimen.  09/25/2021 Sunit Tolia DO (cardiology) - Patient was seen for Benign hypertension and additional concerns. Started Diltiazem 120 mg daily. Increased Losartan 50 mg daily. Discontinued Amlodipine, Desonide, HCTZ, MVI and Mupirocin. Follow up in 3 months.    09/04/2021 Sunit Tolia DO (cardiology) - Patient was seen for Paroxysmal atrial fibrillation and additional concerns. Started Losartan 25 mg daily. Discontinue Gabapentin and HCTZ. Follow up in 6 months.    07/31/2021 Ellouise Newer MD (neurology) - Patient was seen for loss of consciousness. No medication changes. Follow up in 7 months.   06/19/21 Janene Harvey, PT (neuro rehab): Patient presented for PT session for unsteadiness on feet.  Hospital visits: None in previous 6 months.  Objective:  Lab Results  Component Value Date   CREATININE 1.12 10/30/2021   BUN 17 10/30/2021   GFR 64.99 05/15/2021   GFRNONAA 56 (L) 05/06/2021   GFRAA 124 10/28/2007   NA 133 (L) 10/30/2021   K 4.6 10/30/2021   CALCIUM 9.2 10/30/2021   CO2 22 10/30/2021   GLUCOSE 120 (H) 10/30/2021    Lab Results  Component Value Date/Time   HGBA1C 5.5 05/15/2021 12:02 PM   HGBA1C 5.8 (A) 06/28/2020 04:16 PM   HGBA1C 5.4 12/08/2015 02:10 PM   GFR 64.99 05/15/2021 12:02 PM   GFR 80.97 02/09/2020 08:11 AM    Last diabetic Eye exam: No results found for: "HMDIABEYEEXA"  Last diabetic Foot exam: No results found for: "HMDIABFOOTEX"   Lab Results  Component Value Date   CHOL 163 04/05/2019   HDL 47.60 04/05/2019   LDLCALC 95 04/05/2019   TRIG 103.0 04/05/2019   CHOLHDL 3 04/05/2019  Latest Ref Rng & Units 05/04/2021    8:18 PM 03/03/2021   12:44 AM 02/09/2020    8:11 AM  Hepatic Function  Total Protein 6.5 - 8.1 g/dL 6.6  6.6  6.5   Albumin 3.5 - 5.0 g/dL 3.6  3.7  3.8   AST 15 - 41 U/L _0 ALT 0 - 44 U/L _1 Alk Phosphatase 38 - 126 U/L 57  57  70   Total Bilirubin 0.3 - 1.2 mg/dL 0.5  0.8  0.6    Bilirubin, Direct 0.0 - 0.2 mg/dL  0.1      Lab Results  Component Value Date/Time   TSH 1.63 08/29/2020 04:11 PM   TSH 2.96 04/05/2019 10:18 AM       Latest Ref Rng & Units 09/13/2021   12:31 PM 05/06/2021   12:08 AM 05/04/2021    8:18 PM  CBC  WBC 4.0 - 10.5 K/uL  8.3  6.8   Hemoglobin 13.0 - 17.7 g/dL 14.0  13.7  14.3   Hematocrit 37.5 - 51.0 % 41.6  40.3  40.4   Platelets 150 - 400 K/uL  242  240     No results found for: "VD25OH"  Clinical ASCVD: No  The ASCVD Risk score (Arnett DK, et al., 2019) failed to calculate for the following reasons:   The 2019 ASCVD risk score is only valid for ages 52 to 63       11/20/2021    2:04 PM 06/27/2021    3:55 PM 04/23/2021   12:00 PM  Depression screen PHQ 2/9  Decreased Interest 1 0 1  Down, Depressed, Hopeless 1 0 1  PHQ - 2 Score 2 0 2  Altered sleeping 1  1  Tired, decreased energy 1  0  Change in appetite 1  0  Feeling bad or failure about yourself  1  1  Trouble concentrating 2  2  Moving slowly or fidgety/restless 1  0  Suicidal thoughts 1  1  PHQ-9 Score 10  7  Difficult doing work/chores Somewhat difficult  Somewhat difficult      Social History   Tobacco Use  Smoking Status Never  Smokeless Tobacco Never   BP Readings from Last 3 Encounters:  11/20/21 120/64  09/25/21 (!) 145/68  09/04/21 (!) 146/73   Pulse Readings from Last 3 Encounters:  11/20/21 (!) 55  09/25/21 67  09/04/21 67   Wt Readings from Last 3 Encounters:  11/20/21 156 lb (70.8 kg)  09/25/21 160 lb (72.6 kg)  09/04/21 160 lb (72.6 kg)   BMI Readings from Last 3 Encounters:  11/20/21 23.72 kg/m  09/25/21 24.33 kg/m  09/04/21 24.33 kg/m    Assessment/Interventions: Review of patient past medical history, allergies, medications, health status, including review of consultants reports, laboratory and other test data, was performed as part of comprehensive evaluation and provision of chronic care management services.   SDOH:   (Social Determinants of Health) assessments and interventions performed: Yes  SDOH Interventions    Flowsheet Row Chronic Care Management from 12/18/2021 in [place] at [place] from 12/05/2021 in [place] from 06/27/2021 in [place] at [place] from 06/01/2020 in [place] at [place] [facility] from 03/16/2020 in [place] at [place] from 03/20/2018 in [place] at [place]  Interventions -- -- Intervention Not Indicated Intervention Not Indicated -- --  Housing Interventions -- Intervention Not Indicated Intervention Not Indicated Intervention Not Indicated -- --  Transportation Interventions -- -- Intervention Not Indicated -- Intervention Not Indicated --  Utilities Interventions -- Intervention Not Indicated -- -- -- --  Depression Interventions/Treatment  -- -- -- -- -- Counseling  [behavioral health referral made]  Financial Strain Interventions Intervention Not Indicated -- Intervention Not Indicated Intervention Not Indicated Intervention Not Indicated --  Physical Activity Interventions -- -- Intervention Not Indicated Intervention Not Indicated -- --  Stress Interventions -- -- Intervention Not Indicated Intervention Not Indicated -- --  Social Connections Interventions -- -- Intervention Not Indicated Intervention Not Indicated -- --      SDOH Screenings   Food Insecurity: No Food Insecurity (12/05/2021)  Housing: Low Risk  (12/05/2021)  Transportation Needs: No Transportation Needs (06/27/2021)  Utilities: Not At Risk (12/05/2021)  Alcohol Screen: Low Risk  (06/27/2021)  Depression (PHQ2-9): Medium Risk (11/20/2021)  Financial Resource Strain: Low Risk  (12/18/2021)  Physical Activity: Sufficiently Active (06/27/2021)  Social Connections: Moderately Integrated (06/27/2021)   Stress: No Stress Concern Present (06/27/2021)  Tobacco Use: Low Risk  (12/05/2021)    CCM Care Plan  No Known Allergies  Medications Reviewed Today     Reviewed by Viona Gilmore, [facility] (Pharmacist) on 12/18/21 at 0850  Med List Status: <None>   Medication Order Taking? Sig Documenting Provider Last Dose Status Informant  apixaban (ELIQUIS) 5 MG TABS tablet [id number]  Take 1 tablet (5 mg total) by mouth 2 (two) times daily. Tolia, Sunit, DO  Active   Cyanocobalamin (VITAMIN B12) 500 MCG TABS [id number]  Take 1 tablet by mouth daily. [provider]  Active   diltiazem (TIAZAC) 120 MG 24 hr capsule [id number] Yes Take 1 capsule (120 mg total) by mouth every morning. Terri Skains, Sunit, DO Taking Active   hydrochlorothiazide (MICROZIDE) 12.5 MG capsule [id number] Yes Take 1 capsule (12.5 mg total) by mouth daily. Haynes Dage, RPH Taking Active   ketoconazole (NIZORAL) 2 % cream [id number]  SMARTSIG:1 Application Topical 1 to 2 Times Daily [provider]  Active   levETIRAcetam (KEPPRA) 500 MG tablet [id number]  Take 1 tablet (500 mg total) by mouth 2 (two) times daily. Cameron Sprang, MD  Active   levocetirizine Harlow Ohms) 5 MG tablet [id number]  TAKE ONE TABLET BY MOUTH EVERY Rutha Bouchard, Alinda Sierras, MD  Active Self, Pharmacy Records  losartan (COZAAR) 100 MG tablet [id number] Yes Take 1 tablet (100 mg total) by mouth every evening. Rex Kras, DO Taking Active   omeprazole (PRILOSEC) 20 MG capsule [id number]  Take 1 capsule (20 mg total) by mouth 2 (two) times daily. Burchette, Alinda Sierras, MD  Active   Oxymetazoline HCl (NASAL SPRAY) 0.05 % SOLN [id number]  Place into the nose. [provider]  Active Self, Pharmacy Records  traMADol (ULTRAM) 50 MG tablet [id number]  Take 50 mg by mouth every 6 (six) hours as needed. [provider]  Active Self, Pharmacy Records  zinc gluconate 50 MG tablet [id number]  Take 50 mg by mouth daily as needed. [provider]   Active Self, Pharmacy Records            Patient Active Problem List   Diagnosis Date Noted   Hypokalemia 05/06/2021   CHB (complete heart block) ([place]) 05/05/2021   Syncope 05/04/2021   Paroxysmal atrial fibrillation ([place]) 05/04/2021   History of seizures 05/04/2021   Malignant melanoma  of left forearm ([place]) 10/19/2019   Upper respiratory tract infection 02/14/2017   Porokeratosis 09/07/2014   Metatarsal deformity 09/07/2014   Pain in lower limb 09/07/2014   Squamous cell carcinoma in situ of skin 10/15/2013   Hives 10/06/2012   History of Guillain-Barre syndrome 03/07/2011   Hypertension 11/28/2010   Dyslipidemia 11/28/2010   ERECTILE DYSFUNCTION 01/05/2008   ASTHMA 10/28/2007   GERD 10/28/2007   DIZZINESS 10/28/2007   COUGH 10/28/2007   FASTING HYPERGLYCEMIA 10/28/2007   PROSTATE CANCER, HX OF 10/28/2007    Immunization History  Administered Date(s) Administered   Pneumococcal Polysaccharide-23 11/28/2010    Patient reports he has been checking his BP 3 times a day and the readings are transmitted to his cardiologist's office.   Patient reports he is still taking only one Eliquis mid-day. Patient reports he cannot recall what side effects he was having when he was taking twice daily. He reports he is "getting them all". He said it's messing up his golf game. Patient reports he didn't want to switch medications but then was open to it as we discussed it further.   Per pharmacy fill history, patient is not compliant with most medications.   Conditions to be addressed/monitored:  Hypertension, GERD, Allergic Rhinitis and Pain, Cold Sore Prevention  Conditions addressed this visit: Hypertension, Afib  Care Plan : [place]  Updates made by Viona Gilmore, [place] since 12/18/2021 12:00 AM     Problem: Problem: Hypertension, GERD, Allergic Rhinitis and Pain, Cold Sore Prevention      Long-Range Goal: Patient-Specific Goal   Start Date: 06/29/2020   Expected End Date: 06/29/2021  Recent Progress: On track  Priority: High  Note:   Current Barriers:  Unable to independently monitor therapeutic efficacy Does not adhere to prescribed medication regimen  Pharmacist Clinical Goal(s):  Patient will achieve adherence to monitoring guidelines and medication adherence to achieve therapeutic efficacy through collaboration with PharmD and provider.   Interventions: 1:1 collaboration with [name], MD regarding development and update of comprehensive plan of care as evidenced by provider attestation and co-signature Inter-disciplinary care team collaboration (see longitudinal plan of care) Comprehensive medication review performed; medication list updated in electronic medical record  Hypertension (BP goal <140/90) -Controlled -Current treatment: Losartan 100 mg 1 tablet daily - PM -  Appropriate, Effective, Query Safe, Accessible Hydrochlorothiazide 12.5 mg 1 capsule daily - AM -  Appropriate, Effective, Safe, Accessible Diltiazem 120 mg 1 capsule daily - AM  - Appropriate, Effective, Safe, Accessible -Medications previously tried: none  -Current home readings: 146/74, 132/74, 131/66 (checking 3 times a day with remote monitoring with cardiology) -Current dietary habits: uses very little salt, compares package labels -Current exercise habits: limited due to pain -Denies hypotensive/hypertensive symptoms -Educated on BP goals and benefits of medications for prevention of heart attack, stroke and kidney damage; Importance of home blood pressure monitoring; Proper BP monitoring technique; -Counseled to monitor BP at home weekly, document, and provide log at future appointments -Counseled on diet and exercise extensively Recommended to continue current medication Counseled on importance of taking blood pressure medications daily.  GERD (Goal: minimize symptoms of heartburn) -Controlled -Current treatment  Omeprazole 20 mg 1  capsule once daily - Appropriate, Effective, Safe, Accessible -Medications previously tried: none  -Counseled on non-pharmacologic management of symptoms such as elevating the head of your bed, avoiding eating 2-3 hours before bed, avoiding triggering foods such as acidic, spicy, or fatty foods, eating smaller meals, and wearing clothes that  are loose around the waist  Allergic rhinitis (Goal: minimize symptoms) -Uncontrolled -Current treatment  Azelastine 0.1% nasal spray 1 spray in both nostrils twice daily as needed - Appropriate, Query effective, Safe, Accessible Xyzal 5 mg 1 tablet every evening as needed (not taking) - Appropriate, Query effective, Safe, Accessible Oxymetazoline (Sinex) nasal spray 0.05% as needed (not taking) - Appropriate, Query effective, Query Safe, Accessible -Medications previously tried: n/a  -Counseled on avoiding use of Benadryl or diphenhydramine for allergies and taking only one Xyzal or Claritin or Zyrtec per day Recommended limiting use of Afrin to 3 days in a row due to rebound congestion and increased BP  Pain (Goal: limiting pain) -Controlled -Current treatment  Tylenol 500 mg 1 tablet in AM and 1 tablet in PM - Appropriate, Effective, Safe, Accessible Tramadol 50 mg 1 tablet as needed (taking one per day) - Appropriate, Query effective, Query Safe, Accessible -Medications previously tried: n/a  -Counseled on limiting use of NSAIDs due to GI bleeds and kidney damage . Topicals -Uncontrolled -Current treatment  Triamcinolone cream 0.1% apply to affected areas twice daily Tretinoin 0.05% cream apply every night Fluorouracil apply topically as needed Benadryl cream daily as needed Ciclopirox 0.77% cream apply to affected area twice daily Betamethasone lotion as needed Desonide cream 0.05% as needed for feet (heat rash)  -Medications previously tried: n/a -Counseled on uses for creams and patient was unsure which one was prescribed for different  skin conditions  Cold sore prevention (Goal: prevent/treat cold sores) -Controlled -Current treatment  Acyclovir 400 mg 1 tablet as needed - Appropriate, Effective, Safe, Accessible -Medications previously tried: none  -Recommended to continue current medication  Atrial Fibrillation (Goal: prevent stroke and major bleeding) -Uncontrolled -CHADSVASC: 3 -Current treatment: Rate control: diltiazem 120 mg 1 tablet daily - Appropriate, Effective, Safe, Accessible Anticoagulation: Eliquis 5 mg 1 tablet twice daily - only taking once daily - Appropriate, Query effective, Query Safe, Accessible -Medications previously tried: none -Home BP and HR readings: none  -Counseled on importance of adherence to anticoagulant exactly as prescribed; bleeding risk associated with Eliquis and importance of self-monitoring for signs/symptoms of bleeding; avoidance of NSAIDs due to increased bleeding risk with anticoagulants; -Counseled on importance of taking Eliquis twice daily. Recommended switching to Xarelto for once daily dosing.  Health Maintenance -Vaccine gaps: several (Patient has a history of guillain barre syndrome and prefers to not get vaccines due to this) -Current therapy:  Lipoflavanoid for ear ringing Leg cramping tablet Tonic water with quinine Vitamin D 5000 units as needed (every 4-5 days) Zinc 50 mg 1 tablet every 4-5 days Multivitamin every 4-5 days -Educated on Cost vs benefit of each product must be carefully weighed by individual consumer -Patient is satisfied with current therapy and denies issues -Recommended to continue current medication Counseled on taking high dose of vitamin D every other day to avoid overdose  Patient Goals/Self-Care Activities Patient will:  - take medications as prescribed focus on medication adherence by taking amlodipine every evening check blood pressure weekly, document, and provide at future appointments  Follow Up Plan: Telephone follow up  appointment with care management team member scheduled for: 4 months        Medication Assistance: None required.  Patient affirms current coverage meets needs.  Compliance/Adherence/Medication fill history: Care Gaps: COVID vaccine, shingrix, tetanus, PCV20 Last BP - 135/80 on 06/05/2021   Star-Rating Drugs: Losartan 100 mg - last filled 11/29/2021 30 DS [facility]  Patient's preferred pharmacy is:  [facility]  [id number], [place] [address] Phone: [phone] Fax: [phone]  Uses pill box? No - has own system; lines up ones in the morning and then midday ones and then evening ones   - does not have a back up system (not on automatic refills) Pt endorses 70% - only taking 1 Eliquis per day  We discussed: Current pharmacy is preferred with insurance plan and patient is satisfied with pharmacy services Patient decided to: Continue current medication management strategy  Care Plan and Follow Up Patient Decision:  Patient agrees to Care Plan and Follow-up.  Plan: Telephone follow up appointment with care management team member scheduled for:  4 months  Jeni Salles, PharmD [place] Pharmacist [place] at [place] [phone]

## 2021-12-18 ENCOUNTER — Ambulatory Visit (INDEPENDENT_AMBULATORY_CARE_PROVIDER_SITE_OTHER): Payer: PPO | Admitting: Pharmacist

## 2021-12-18 DIAGNOSIS — L570 Actinic keratosis: Secondary | ICD-10-CM | POA: Diagnosis not present

## 2021-12-18 DIAGNOSIS — I10 Essential (primary) hypertension: Secondary | ICD-10-CM

## 2021-12-18 DIAGNOSIS — M9904 Segmental and somatic dysfunction of sacral region: Secondary | ICD-10-CM | POA: Diagnosis not present

## 2021-12-18 DIAGNOSIS — M5136 Other intervertebral disc degeneration, lumbar region: Secondary | ICD-10-CM | POA: Diagnosis not present

## 2021-12-18 DIAGNOSIS — L578 Other skin changes due to chronic exposure to nonionizing radiation: Secondary | ICD-10-CM | POA: Diagnosis not present

## 2021-12-18 DIAGNOSIS — I480 Paroxysmal atrial fibrillation: Secondary | ICD-10-CM

## 2021-12-18 DIAGNOSIS — M9903 Segmental and somatic dysfunction of lumbar region: Secondary | ICD-10-CM | POA: Diagnosis not present

## 2021-12-18 DIAGNOSIS — M545 Low back pain, unspecified: Secondary | ICD-10-CM | POA: Diagnosis not present

## 2021-12-18 DIAGNOSIS — L821 Other seborrheic keratosis: Secondary | ICD-10-CM | POA: Diagnosis not present

## 2021-12-24 ENCOUNTER — Telehealth: Payer: PPO

## 2021-12-25 DIAGNOSIS — I10 Essential (primary) hypertension: Secondary | ICD-10-CM

## 2021-12-25 DIAGNOSIS — I442 Atrioventricular block, complete: Secondary | ICD-10-CM | POA: Diagnosis not present

## 2021-12-26 LAB — BASIC METABOLIC PANEL
BUN/Creatinine Ratio: 15 (ref 10–24)
BUN: 16 mg/dL (ref 8–27)
CO2: 24 mmol/L (ref 20–29)
Calcium: 9.2 mg/dL (ref 8.6–10.2)
Chloride: 98 mmol/L (ref 96–106)
Creatinine, Ser: 1.06 mg/dL (ref 0.76–1.27)
Glucose: 109 mg/dL — ABNORMAL HIGH (ref 70–99)
Potassium: 4.7 mmol/L (ref 3.5–5.2)
Sodium: 135 mmol/L (ref 134–144)
eGFR: 70 mL/min/{1.73_m2} (ref >59)

## 2021-12-26 LAB — PRO B NATRIURETIC PEPTIDE: NT-Pro BNP: 149 pg/mL (ref 0–486)

## 2021-12-26 LAB — MAGNESIUM: Magnesium: 2.2 mg/dL (ref 1.6–2.3)

## 2021-12-31 DIAGNOSIS — I10 Essential (primary) hypertension: Secondary | ICD-10-CM | POA: Diagnosis not present

## 2022-01-01 ENCOUNTER — Encounter: Payer: Self-pay | Admitting: Family Medicine

## 2022-01-01 ENCOUNTER — Ambulatory Visit: Payer: PPO | Admitting: Cardiology

## 2022-01-01 ENCOUNTER — Encounter: Payer: Self-pay | Admitting: Cardiology

## 2022-01-01 ENCOUNTER — Ambulatory Visit (INDEPENDENT_AMBULATORY_CARE_PROVIDER_SITE_OTHER): Payer: PPO | Admitting: Family Medicine

## 2022-01-01 VITALS — BP 102/52 | HR 52 | Temp 98.2°F | Ht 68.0 in | Wt 159.0 lb

## 2022-01-01 VITALS — BP 159/67 | HR 59 | Temp 98.1°F | Resp 15 | Ht 68.0 in | Wt 158.0 lb

## 2022-01-01 DIAGNOSIS — Z8669 Personal history of other diseases of the nervous system and sense organs: Secondary | ICD-10-CM

## 2022-01-01 DIAGNOSIS — M898X8 Other specified disorders of bone, other site: Secondary | ICD-10-CM | POA: Diagnosis not present

## 2022-01-01 DIAGNOSIS — E782 Mixed hyperlipidemia: Secondary | ICD-10-CM | POA: Diagnosis not present

## 2022-01-01 DIAGNOSIS — L570 Actinic keratosis: Secondary | ICD-10-CM | POA: Diagnosis not present

## 2022-01-01 DIAGNOSIS — I480 Paroxysmal atrial fibrillation: Secondary | ICD-10-CM

## 2022-01-01 DIAGNOSIS — I10 Essential (primary) hypertension: Secondary | ICD-10-CM

## 2022-01-01 DIAGNOSIS — Z7901 Long term (current) use of anticoagulants: Secondary | ICD-10-CM

## 2022-01-01 DIAGNOSIS — I422 Other hypertrophic cardiomyopathy: Secondary | ICD-10-CM

## 2022-01-01 DIAGNOSIS — M9904 Segmental and somatic dysfunction of sacral region: Secondary | ICD-10-CM | POA: Diagnosis not present

## 2022-01-01 DIAGNOSIS — M9903 Segmental and somatic dysfunction of lumbar region: Secondary | ICD-10-CM | POA: Diagnosis not present

## 2022-01-01 DIAGNOSIS — M5136 Other intervertebral disc degeneration, lumbar region: Secondary | ICD-10-CM | POA: Diagnosis not present

## 2022-01-01 DIAGNOSIS — M545 Low back pain, unspecified: Secondary | ICD-10-CM | POA: Diagnosis not present

## 2022-01-01 MED ORDER — RIVAROXABAN 20 MG PO TABS: 20.0000 mg | ORAL_TABLET | Freq: Every day | ORAL | 0 refills | Status: DC

## 2022-01-01 NOTE — Progress Notes (Signed)
ID:  Drew Smith, DOB 09/21/1940, MRN [id number]  PCP:  Drew Post, MD  Cardiologist:  Rex Kras, DO, [facility] (established care 03/15/2021)  Date: 01/01/22 Last Office Visit: 09/25/2021  Chief Complaint  Patient presents with   Follow-up    3 month. Hypertrophic cardiomyopathy and paroxysmal atrial fibrillation    HPI  Drew Smith is a 81 y.o. Caucasian male whose past medical history and cardiovascular risk factors include: History of syncope, conduction disorder, paroxysmal atrial fibrillation, hypertrophic cardiomyopathy (cardiac MRI), HTN, HLD, prostate cancer.  He is referred to the office at the request of Drew Post, MD for evaluation of loss of consciousness (September 2022 in January 2023).  He underwent cardiovascular testing as noted below.  Notably his extended Holter monitor had episodes of nocturnal pauses and one episode of pause with complete heart block during awakening hours and paroxysmal atrial fibrillation.  He was being considered for pacemaker implantation.  However, had episode of near syncope in March 2023 which resulted in elective hospitalization.  He was evaluated by cardiac electrophysiology inpatient and underwent cardiac MRI and was noted to have asymmetrical septal hypertrophy consistent with HCM.  Cardiac electrophysiology suspected that his episodes of pauses/transient complete heart block likely due to vagal mediated events and therefore patient did not undergo device therapy.  Since then his hypertensive medications have been titrated and is being followed by ambulatory blood pressure monitoring.  His average blood pressure is 130/69 with a pulse of 61 bpm on current medical therapy.  Since last office visit he has not had any reoccurrence of syncope or falls.  Given his paroxysmal atrial fibrillation patient states that he has been taking anticoagulation once a day (Eliquis) despite knowing that it is a twice daily dosing.   Patient would like to transition to once a day tablet if possible.  FUNCTIONAL STATUS: Golfing 3 times a week.    ALLERGIES: No Known Allergies  MEDICATION LIST PRIOR TO VISIT: Current Meds  Medication Sig   Cyanocobalamin (VITAMIN B12) 500 MCG TABS Take 1 tablet by mouth daily.   diltiazem (TIAZAC) 120 MG 24 hr capsule Take 1 capsule (120 mg total) by mouth every morning.   hydrochlorothiazide (MICROZIDE) 12.5 MG capsule Take 1 capsule (12.5 mg total) by mouth daily.   ketoconazole (NIZORAL) 2 % cream SMARTSIG:1 Application Topical 1 to 2 Times Daily   levETIRAcetam (KEPPRA) 500 MG tablet Take 1 tablet (500 mg total) by mouth 2 (two) times daily.   losartan (COZAAR) 100 MG tablet Take 1 tablet (100 mg total) by mouth every evening.   omeprazole (PRILOSEC) 20 MG capsule Take 1 capsule (20 mg total) by mouth 2 (two) times daily.   Oxymetazoline HCl (NASAL SPRAY) 0.05 % SOLN Place into the nose.   rivaroxaban (XARELTO) 20 MG TABS tablet Take 1 tablet (20 mg total) by mouth daily with supper.   traMADol (ULTRAM) 50 MG tablet Take 50 mg by mouth every 6 (six) hours as needed.   zinc gluconate 50 MG tablet Take 50 mg by mouth daily as needed.   [DISCONTINUED] apixaban (ELIQUIS) 5 MG TABS tablet Take 1 tablet (5 mg total) by mouth 2 (two) times daily.   [DISCONTINUED] levocetirizine (XYZAL) 5 MG tablet TAKE ONE TABLET BY MOUTH EVERY EVENING     PAST MEDICAL HISTORY: Past Medical History:  Diagnosis Date   Arthritis    Asthma    Cancer ([place])    prostate   GERD (gastroesophageal reflux disease)  Guillain-Barre syndrome ([place]) 02/25/1998   Hyperlipidemia    Hypertension    Seizures ([place])     PAST SURGICAL HISTORY: Past Surgical History:  Procedure Laterality Date   CATARACT EXTRACTION     EYE SURGERY  02/26/1943   81 years old   HERNIA REPAIR  02/25/1993   NOSE SURGERY  02/26/1988   PROSTATE SURGERY  02/25/1998   seed inplants   SHOULDER SURGERY  02/25/2006    FAMILY  HISTORY: The patient family history includes Alcohol abuse in his father; Bladder Cancer in his brother; Diabetes in his brother and brother; Hyperlipidemia in his brother; Stroke in his brother.  SOCIAL HISTORY:  The patient  reports that he has never smoked. He has never used smokeless tobacco. He reports that he does not currently use alcohol after a past usage of about 5.0 standard drinks of alcohol per week. He reports that he does not use drugs.  REVIEW OF SYSTEMS: Review of Systems  Cardiovascular:  Negative for chest pain, dyspnea on exertion, leg swelling, near-syncope, orthopnea, palpitations, paroxysmal nocturnal dyspnea and syncope.  Respiratory:  Negative for shortness of breath.   Hematologic/Lymphatic: Negative for bleeding problem.   PHYSICAL EXAM:    01/01/2022    4:21 PM 01/01/2022   10:29 AM 11/20/2021    1:59 PM  Vitals with BMI  Height '5\' 8"'$  '5\' 8"'$    Weight 159 lbs 158 lbs 156 lbs  BMI 08.67 61.95   Systolic 093 267 124  Diastolic 52 67 64  Pulse 52 59 55   Physical Exam  Constitutional: No distress.  Age appropriate, hemodynamically stable.   Neck: No JVD present.  Cardiovascular: Normal rate, regular rhythm, S1 normal, S2 normal, intact distal pulses and normal pulses. Exam reveals no gallop, no S3 and no S4.  No murmur heard. Pulmonary/Chest: Effort normal and breath sounds normal. No stridor. He has no wheezes. He has no rales.  Abdominal: Soft. Bowel sounds are normal. He exhibits no distension. There is no abdominal tenderness.  Musculoskeletal:        General: No edema.     Cervical back: Neck supple.  Neurological: He is alert and oriented to person, place, and time. He has intact cranial nerves (2-12).  Skin: Skin is warm and moist.   CARDIAC DATABASE: EKG: 09/04/2021: Normal sinus rhythm, 61 bpm, left axis, left anterior fascicular block consider old lateral infarct. 01/01/2022: Sinus bradycardia, 55 bpm, first-degree AV block, left axis, left  anterior fascicular block, consider old lateral infarct.   Echocardiogram: 03/27/2021: Study Quality: Technically difficult study. Normal LV systolic function with visual EF 60-65%. Left ventricle cavity is normal in size. Moderate left ventricular hypertrophy, prominent basal septum (IVSd1.67cm and LVPWd 1.23cm). Normal global wall motion. Normal diastolic filling pattern, normal LAP.  Trace aortic regurgitation. Trace tricuspid regurgitation. No evidence of pulmonary hypertension. No prior study for comparison.   Stress Testing: Exercise treadmill stress test 04/13/2021: Exercise treadmill stress test performed using Bruce protocol. Patient reached 4.6 METS, and 87% of age predicted maximum heart rate. Exercise capacity was low. No chest pain reported. Normal heart rate response with hypertensive exercise response. Peak BP 220/80 mmHg. Stress EKG shows no ischemic changes. Low risk study.  Heart Catheterization: None  Carotid artery duplex 03/27/2021: Duplex suggests stenosis in the right internal carotid artery (1-15%). Duplex suggests stenosis in the left internal carotid artery (1-15%). Mild diffuse homogeneous plaque noted in bilateral carotid arteries. Left carotid exhibits tortuosity and may be a source of bruit.  Antegrade right vertebral artery flow. Antegrade left vertebral artery flow. Follow up studies if clinically indicated.  Cardiac MRI 05/07/2021: 1. Asymmetric septal hypertrophy measuring up to 40m in basal septum (187min posterior wall), meeting criteria for hypertrophic cardiomyopathy   2. Patchy late gadolinium enhancement in basal septum and lateral wall, consistent with HCM. LGE accounts for 5% of total myocardial mass   3.  Normal LV size and systolic function (EF 5438%  4.  Normal RV size and systolic function (EF 5510% LABORATORY DATA:    Latest Ref Rng & Units 09/13/2021   12:31 PM 05/06/2021   12:08 AM 05/04/2021    8:18 PM  CBC  WBC 4.0 - 10.5 K/uL   8.3  6.8   Hemoglobin 13.0 - 17.7 g/dL 14.0  13.7  14.3   Hematocrit 37.5 - 51.0 % 41.6  40.3  40.4   Platelets 150 - 400 K/uL  242  240        Latest Ref Rng & Units 12/25/2021   11:49 AM 10/30/2021    2:01 PM 10/18/2021   10:54 AM  CMP  Glucose 70 - 99 mg/dL 109  120  123   BUN 8 - 27 mg/dL '16  17  17   '$ Creatinine 0.76 - 1.27 mg/dL 1.06  1.12  0.93   Sodium 134 - 144 mmol/L 135  133  140   Potassium 3.5 - 5.2 mmol/L 4.7  4.6  4.6   Chloride 96 - 106 mmol/L 98  97  101   CO2 20 - 29 mmol/L '24  22  22   '$ Calcium 8.6 - 10.2 mg/dL 9.2  9.2  9.2     Lipid Panel     Component Value Date/Time   CHOL 163 04/05/2019 1018   TRIG 103.0 04/05/2019 1018   HDL 47.60 04/05/2019 1018   CHOLHDL 3 04/05/2019 1018   VLDL 20.6 04/05/2019 1018   LDLCALC 95 04/05/2019 1018    No components found for: "NTPROBNP" Recent Labs    12/25/21 1148  PROBNP 149   No results for input(s): "TSH" in the last 8760 hours.   BMP Recent Labs    05/04/21 2018 05/05/21 0239 05/06/21 0008 05/15/21 1202 10/18/21 1054 10/30/21 1401 12/25/21 1149  NA 134* 138 133*   < > 140 133* 135  K 3.8 4.3 3.3*   < > 4.6 4.6 4.7  CL 100 103 101   < > 101 97 98  CO2 '24 27 25   '$ < > '22 22 24  '$ GLUCOSE 196* 93 136*   < > 123* 120* 109*  BUN '19 17 17   '$ < > '17 17 16  '$ CREATININE 1.13 1.00 1.28*   < > 0.93 1.12 1.06  CALCIUM 8.8* 9.2 8.9   < > 9.2 9.2 9.2  GFRNONAA >60 >60 56*  --   --   --   --    < > = values in this interval not displayed.   CrCl cannot be calculated (Patient's most recent lab result is older than the maximum 21 days allowed.).  HEMOGLOBIN A1C Lab Results  Component Value Date   HGBA1C 5.5 05/15/2021    IMPRESSION:    ICD-10-CM   1. Paroxysmal atrial fibrillation (HCC)  I48.0 EKG 12-Lead    2. Long term (current) use of anticoagulants  Z79.01 rivaroxaban (XARELTO) 20 MG TABS tablet    3. Hypertrophic cardiomyopathy ([place] asymmetric septal hypertrophy  I42.2     4.  Benign hypertension   I10     5. Mixed hyperlipidemia  E78.2     6. History of Guillain-Barre syndrome  Z86.69         RECOMMENDATIONS: NOLEN LINDAMOOD is a 81 y.o. Caucasian male whose past medical history and cardiac risk factors include: History of syncope, conduction disorder, paroxysmal atrial fibrillation, hypertrophic cardiomyopathy (cardiac MRI), HTN, HLD, prostate cancer.  Paroxysmal atrial fibrillation (HCC) Rate control: Diltiazem Rhythm control: N/A Thromboembolic prophylaxis: Eliquis CHA2DS2-VASc SCORE is 3 which correlates to 3.2% risk of stroke per year.  Long term (current) use of anticoagulants Indication: Paroxysmal atrial fibrillation. Does not endorse evidence of bleeding. Discontinue Eliquis. Start Xarelto 20 mg p.o. q. Supper Medication profile discussed.   Reemphasized the risks, benefits, and alternatives to anticoagulation.  Hypertrophic cardiomyopathy ([place]), asymmetric septal hypertrophy Diagnosed by cardiac MRI March 2023. No evidence of obstructive physiology on echocardiogram. Medications reconciled. No near-syncope or syncope since last office visit.  Benign hypertension Currently enrolled into principal care management for ambulatory blood pressure monitoring. Home blood pressures are well-controlled. Continue current medical therapy.  FINAL MEDICATION LIST END OF ENCOUNTER: Meds ordered this encounter  Medications   rivaroxaban (XARELTO) 20 MG TABS tablet    Sig: Take 1 tablet (20 mg total) by mouth daily with supper.    Dispense:  90 tablet    Refill:  0    Medications Discontinued During This Encounter  Medication Reason   levocetirizine (XYZAL) 5 MG tablet    apixaban (ELIQUIS) 5 MG TABS tablet Change in therapy      Current Outpatient Medications:    Cyanocobalamin (VITAMIN B12) 500 MCG TABS, Take 1 tablet by mouth daily., Disp: , Rfl:    diltiazem (TIAZAC) 120 MG 24 hr capsule, Take 1 capsule (120 mg total) by mouth every morning., Disp: 30  capsule, Rfl: 5   hydrochlorothiazide (MICROZIDE) 12.5 MG capsule, Take 1 capsule (12.5 mg total) by mouth daily., Disp: 30 capsule, Rfl: 0   ketoconazole (NIZORAL) 2 % cream, SMARTSIG:1 Application Topical 1 to 2 Times Daily, Disp: , Rfl:    levETIRAcetam (KEPPRA) 500 MG tablet, Take 1 tablet (500 mg total) by mouth 2 (two) times daily., Disp: 180 tablet, Rfl: 3   losartan (COZAAR) 100 MG tablet, Take 1 tablet (100 mg total) by mouth every evening., Disp: 30 tablet, Rfl: 5   omeprazole (PRILOSEC) 20 MG capsule, Take 1 capsule (20 mg total) by mouth 2 (two) times daily., Disp: 180 capsule, Rfl: 0   Oxymetazoline HCl (NASAL SPRAY) 0.05 % SOLN, Place into the nose., Disp: , Rfl:    rivaroxaban (XARELTO) 20 MG TABS tablet, Take 1 tablet (20 mg total) by mouth daily with supper., Disp: 90 tablet, Rfl: 0   traMADol (ULTRAM) 50 MG tablet, Take 50 mg by mouth every 6 (six) hours as needed., Disp: , Rfl:    zinc gluconate 50 MG tablet, Take 50 mg by mouth daily as needed., Disp: , Rfl:   Orders Placed This Encounter  Procedures   EKG 12-Lead    There are no Patient Instructions on file for this visit.   --Continue cardiac medications as reconciled in final medication list. --Return in about 6 months (around 07/02/2022) for Follow up, A. fib. Or sooner if needed. --Continue follow-up with your primary care physician regarding the management of your other chronic comorbid conditions.  Patient's questions and concerns were addressed to his satisfaction. He voices understanding of the instructions provided during this encounter.   This  note was created using a voice recognition software as a result there may be grammatical errors inadvertently enclosed that do not reflect the nature of this encounter. Every attempt is made to correct such errors.  Rex Kras, [place], [facility]  Pager: [phone] Office: [phone]

## 2022-01-01 NOTE — Progress Notes (Unsigned)
Established Patient Office Visit  Subjective   Patient ID: Drew Smith, male    DOB: 1940/07/18  Age: 81 y.o. MRN: [id number]  Chief Complaint  Patient presents with   Rash    Patient complains of two dark spots on the right temple area that seem larger in size for the past month, also bump center of chest x2years    Pt is reporting two dark spots on the right temple, states that they have been there for about 3-6 months, has noticed in the last month or so they have gotten larger. 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 He is also reporting a lump on the upper chest that had been there for about 2 years. States that he feels like it is enlarging, he denies any difficulty breathing, no chest pain, no other associated symptoms.    Current Outpatient Medications  Medication Instructions   Cyanocobalamin (VITAMIN B12) 500 MCG TABS 1 tablet, Oral, Daily   diltiazem (TIAZAC) 120 mg, Oral, Every morning   hydrochlorothiazide (MICROZIDE) 12.5 mg, Oral, Daily   ketoconazole (NIZORAL) 2 % cream SMARTSIG:1 Application Topical 1 to 2 Times Daily   levETIRAcetam (KEPPRA) 500 mg, Oral, 2 times daily   losartan (COZAAR) 100 mg, Oral, Every evening   omeprazole (PRILOSEC) 20 mg, Oral, 2 times daily   Oxymetazoline HCl (NASAL SPRAY) 0.05 % SOLN Nasal   rivaroxaban (XARELTO) 20 mg, Oral, Daily with supper   traMADol (ULTRAM) 50 mg, Oral, Every 6 hours PRN   zinc gluconate 50 mg, Oral, Daily PRN    Patient Active Problem List   Diagnosis Date Noted   Actinic keratosis 01/02/2022   Hypokalemia 05/06/2021   CHB (complete heart block) ([place]) 05/05/2021   Syncope 05/04/2021   Paroxysmal atrial fibrillation ([place]) 05/04/2021   History of seizures 05/04/2021   Malignant melanoma of left forearm ([place]) 10/19/2019   Upper respiratory tract infection 02/14/2017   Porokeratosis 09/07/2014   Metatarsal deformity 09/07/2014   Pain in lower  limb 09/07/2014   Squamous cell carcinoma in situ of skin 10/15/2013   Hives 10/06/2012   History of Guillain-Barre syndrome 03/07/2011   Hypertension 11/28/2010   Dyslipidemia 11/28/2010   ERECTILE DYSFUNCTION 01/05/2008   ASTHMA 10/28/2007   GERD 10/28/2007   DIZZINESS 10/28/2007   COUGH 10/28/2007   FASTING HYPERGLYCEMIA 10/28/2007   PROSTATE CANCER, HX OF 10/28/2007      Review of Systems  All other systems reviewed and are negative.     Objective:     BP (!) 102/52 (BP Location: Left Arm, Patient Position: Sitting, Cuff Size: Normal)   Pulse (!) 52   Temp 98.2 F (36.8 C) (Oral)   Ht '5\' 8"'$  (1.727 m)   Wt 159 lb (72.1 kg)   SpO2 97%   BMI 24.18 kg/m    Physical Exam Vitals reviewed.  Constitutional:      Appearance: Normal appearance. He is well-groomed and normal weight.  Eyes:     Extraocular Movements: Extraocular movements intact.     Conjunctiva/sclera: Conjunctivae normal.  Neck:     Thyroid: No thyromegaly.  Cardiovascular:     Rate and Rhythm: Regular rhythm.     Heart sounds: S1 normal and S2 normal. No murmur heard. Pulmonary:     Effort: Pulmonary effort is normal.     Breath sounds: Normal breath sounds and air entry. No rales.  Chest:  Chest wall: Mass (there is a rounded "lump" that is on the upper lest chest, it feels hard and fixated to the ches wall, howeer there are regular borders.) present.    Abdominal:     General: Abdomen is flat. Bowel sounds are normal.  Musculoskeletal:     Right lower leg: No edema.     Left lower leg: No edema.  Skin:    Findings: Lesion (patient has two lesions on the right temple, one was just inside the hairline. the superior lesions was hyperpigmented, round and raised, no irregular borders, no scaling or crusting.) present.  Neurological:     General: No focal deficit present.     Mental Status: He is alert and oriented to person, place, and time.     Gait: Gait is intact.  Psychiatric:         Mood and Affect: Mood and affect normal.    Cryotherapy Procedure Note  Pre-operative Diagnosis: Actinic keratosis, Suspicious lesion  Post-operative Diagnosis: Actinic keratosis, Suspicious lesion  Locations: right, temporal head  Anesthesia: not required   Procedure Details   Patient informed of risks (permanent scarring, infection, light or dark discoloration, bleeding, infection, weakness, numbness and recurrence of the lesion) and benefits of the procedure and verbal informed consent obtained.  The two areas are treated with liquid nitrogen therapy, frozen until ice ball extended 2 mm beyond lesion, allowed to thaw, and treated again. The patient tolerated procedure well.  The patient was instructed on post-op care, warned that there may be blister formation, redness and pain. Recommend OTC analgesia as needed for pain.   No results found for any visits on 01/01/22.    The ASCVD Risk score (Arnett DK, et al., 2019) failed to calculate for the following reasons:   The 2019 ASCVD risk score is only valid for ages 35 to 66    Assessment & Plan:   Problem List Items Addressed This Visit       Musculoskeletal and Integument   Actinic keratosis    Patient is s/p cryotherapy in the office of 2 lesions on the right temple. He tolerated the procedure well. Will follow up as needed for additional cryotherapy.      Other Visit Diagnoses     Sternal mass    -  Primary   Relevant Orders   Unclear etiology, it could be related to arthritic changes where the cartilage meets the sternum. I will order a rib series to try and better visualize the area. He will follow up with Dr. Elease Hashimoto.  DG Ribs Unilateral Left       Return if symptoms worsen or fail to improve, for needs follow up with Dr. Ward Givens.    Farrel Conners, MD

## 2022-01-02 DIAGNOSIS — L570 Actinic keratosis: Secondary | ICD-10-CM | POA: Insufficient documentation

## 2022-01-02 NOTE — Assessment & Plan Note (Signed)
Patient is s/p cryotherapy in the office of 2 lesions on the right temple. He tolerated the procedure well. Will follow up as needed for additional cryotherapy.

## 2022-01-03 ENCOUNTER — Other Ambulatory Visit: Payer: PPO

## 2022-01-03 ENCOUNTER — Other Ambulatory Visit: Payer: Self-pay | Admitting: Family Medicine

## 2022-01-03 ENCOUNTER — Ambulatory Visit: Payer: PPO

## 2022-01-03 ENCOUNTER — Ambulatory Visit (INDEPENDENT_AMBULATORY_CARE_PROVIDER_SITE_OTHER): Payer: PPO

## 2022-01-03 DIAGNOSIS — I480 Paroxysmal atrial fibrillation: Secondary | ICD-10-CM

## 2022-01-03 DIAGNOSIS — M898X8 Other specified disorders of bone, other site: Secondary | ICD-10-CM | POA: Diagnosis not present

## 2022-01-03 DIAGNOSIS — I10 Essential (primary) hypertension: Secondary | ICD-10-CM

## 2022-01-03 DIAGNOSIS — R222 Localized swelling, mass and lump, trunk: Secondary | ICD-10-CM | POA: Diagnosis not present

## 2022-01-03 DIAGNOSIS — M2578 Osteophyte, vertebrae: Secondary | ICD-10-CM | POA: Diagnosis not present

## 2022-01-03 NOTE — Addendum Note (Signed)
Addended by: Farrel Conners on: 01/03/2022 11:30 AM   Modules accepted: Orders

## 2022-01-07 ENCOUNTER — Telehealth: Payer: Self-pay

## 2022-01-07 NOTE — Progress Notes (Signed)
X-ray shows bone spurring at the area of the area in question, This is related to arthritis changes. No further imaging or medications are indicated.

## 2022-01-07 NOTE — Progress Notes (Unsigned)
  Chronic Care Management   Note  01/07/2022 Name: Drew Smith MRN: [id number] DOB: 07-04-1940  Drew Smith is a 81 y.o. year old male who is a primary care patient of Burchette, Alinda Sierras, MD. I reached out to Drew Smith by phone today in response to a referral sent by Drew Smith PCP.  Drew Smith was not successfully contacted today. A HIPAA compliant voice message was left requesting a return call.   Follow up plan: Additional outreach attempts will be made.  Noreene Larsson, [place], [place] [address] Direct Dial: [phone] [email or web address]

## 2022-01-10 ENCOUNTER — Telehealth: Payer: Self-pay

## 2022-01-10 NOTE — Telephone Encounter (Signed)
   CCM RN Visit Note   01/10/22 Name: Drew Smith MRN: [id number]      DOB: [birth year]  Subjective: Drew Smith is a 81 y.o. year old male who is a primary care patient of Burchette, Alinda Sierras, MD. The patient was referred to the Chronic Care Management team for assistance with care management needs subsequent to provider initiation of CCM services and plan of care.      An unsuccessful telephone outreach was attempted today to contact the patient about Chronic Care Management needs.    Plan: A HIPAA compliant phone message was left for the patient providing contact information and requesting a return call.   Horris Latino RN Care Manager/Chronic Care Management [phone]

## 2022-01-25 ENCOUNTER — Encounter: Payer: Self-pay | Admitting: Cardiology

## 2022-01-28 ENCOUNTER — Other Ambulatory Visit: Payer: Self-pay | Admitting: Family Medicine

## 2022-01-30 DIAGNOSIS — I10 Essential (primary) hypertension: Secondary | ICD-10-CM | POA: Diagnosis not present

## 2022-02-08 NOTE — Progress Notes (Unsigned)
  Chronic Care Management   Note  02/08/2022 Name: Drew Smith MRN: [id number] DOB: [birth year]  Drew Smith is a 81 y.o. year old male who is a primary care patient of Burchette, Alinda Sierras, MD. I reached out to Drew Smith by phone today in response to a referral sent by [name] PCP.  [name] was not successfully contacted today. A HIPAA compliant voice message was left requesting a return call.   Follow up plan: Additional outreach attempts will be made.  Noreene Larsson, [place], [place] [address] Direct Dial: [phone] [email or web address]

## 2022-02-11 NOTE — Progress Notes (Signed)
  Chronic Care Management   Note  02/11/2022 Name: Drew Smith MRN: [id number] DOB: 16-Jun-1940  Drew Smith is a 81 y.o. year old male who is a primary care patient of Burchette, Alinda Sierras, MD. I reached out to Drew Smith by phone today in response to a referral sent by Drew Smith PCP.  Drew Smith  agreedto scheduling an appointment with the CCM RN Case Manager   Follow up plan: Patient agreed to scheduled appointment with RN Case Manager on 03/12/2022(date/time).   Drew Smith, [place], [place] [address] Direct Dial: [phone] [email or web address]

## 2022-02-15 ENCOUNTER — Other Ambulatory Visit: Payer: Self-pay

## 2022-02-15 ENCOUNTER — Telehealth: Payer: Self-pay

## 2022-02-15 DIAGNOSIS — I10 Essential (primary) hypertension: Secondary | ICD-10-CM

## 2022-02-15 MED ORDER — HYDROCHLOROTHIAZIDE 12.5 MG PO CAPS: 12.5000 mg | ORAL_CAPSULE | Freq: Every day | ORAL | 2 refills | Status: DC

## 2022-02-15 NOTE — Telephone Encounter (Signed)
Patient requested refill

## 2022-02-15 NOTE — Telephone Encounter (Signed)
I would have him follow up w/ his PCP for appropriate workup.  If he is willing to take Eliquis twice daily - please switch him over.  If he notices more bleeding he can hold the blood thinners until the workup is done.   Daysia Vandenboom [place], DO, [facility]

## 2022-02-15 NOTE — Telephone Encounter (Signed)
Patient has been having occasional episodes of spitting up blood first thing in the morning since starting Xarelto. No other signs of bleeding reported. Confirmed episodes are not when patient is brushing his teeth although he does endorse some gum bleeding when flossing.

## 2022-02-15 NOTE — Telephone Encounter (Signed)
Him spitting up blood is that after brushing/flossing or no association.   Dr. Terri Skains

## 2022-03-02 DIAGNOSIS — I10 Essential (primary) hypertension: Secondary | ICD-10-CM | POA: Diagnosis not present

## 2022-03-04 ENCOUNTER — Ambulatory Visit: Payer: PPO | Admitting: Neurology

## 2022-03-04 ENCOUNTER — Encounter: Payer: Self-pay | Admitting: Neurology

## 2022-03-04 VITALS — BP 130/68 | HR 66 | Ht 68.0 in | Wt 160.2 lb

## 2022-03-04 DIAGNOSIS — R4020 Unspecified coma: Secondary | ICD-10-CM

## 2022-03-04 MED ORDER — LEVETIRACETAM 500 MG PO TABS: ORAL_TABLET | ORAL | 3 refills | Status: DC

## 2022-03-04 NOTE — Progress Notes (Signed)
NEUROLOGY FOLLOW UP OFFICE NOTE  CHRISTOHPER Smith [id number] 1940-09-15  HISTORY OF PRESENT ILLNESS: I had the pleasure of seeing Drew Smith in follow-up in the neurology clinic on 03/04/2022.  The patient was last seen 7 months ago for episodes of loss of consciousness. He is alone in the office today. Records and images were personally reviewed where available. His 1-hour wake and sleep EEG was normal. He has had several cardiac studies and decided to hold off on brain MRI. He had a 14-day holter monitor with nocturnal pauses and one episode with pause and complete heart block along with intermittent paroxysmal atrial fibrillation. He was started on Eliquis. He had a cardiac MRI consistent with asymmetrical septal hypertrophy, meeting criteria for HCM. Cardiac electrophysiology suspected his episodes of pauses/transient complete heart block was likely due to vagal medicated events and did not recommend pacemaker.   Since his last visit, he denies any full episodes of loss of consciousness since January 2023, no near syncope since March 2023. He denies any staring/unresponsive episodes. He continues to stay very active playing golf and dancing. He continues on Levetiracetam '500mg'$  BID which was started after the last episodes in January 2023. He is overall tolerating medication but feels that all the medications he is taking is affecting his stomach. 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 One hockey [place] was diagnosed with CTE and he wonders if he has similar issues. He lives alone. Memory is not too bad. He has good long-term memory but sometimes notices short-term memory changes. He denies getting lost driving. He denies missing medications. If he takes the [place] too late, he may have stress dreams. His older brother passed away 3 months ago, it got him a little depressed. He continues to stay busy though. When he plays golf in the heat sometimes,  he would bend down and eyes are glassy. He stands up and feels okay. He stays hydrated. He has some dizziness and has seen Vestibular therapy with home exercises to do. He has noticed that flashing lights, even from the sun through the trees, bother him. He feels like something will happen. He wears sunglasses even in the room sometimes. His EEG in 03/2021 did not show any EEG changes with photic stimulation.    History on Initial Assessment 03/06/2021: This is an 82 year old right-handed man with a history of hypertension, hyperlipidemia, prostate cancer, presenting for 2 episodes of loss of consciousness. He is accompanied by his son Drew Smith who helps supplement the history today. The first event occurred on 11/16/20 while at [facility] and he was brought to [facility]. He was attending a dance festival for several days, he danced earlier that day and recalls having breakfast, went to a mid-day club party (did not drink alcohol), went back to his room to rest for a couple of hours. He recalls laying on the bed then waking up to people around him yelling, unable to understand them. Drew Smith reports he heard a noise and found his father moaning and groaning, he was not responding with eyes opening and closing "almost like having a bad dream." This lasted 5 minutes, he woke up and did not realize what was going on, it took about 10 minutes before he could answer orientation questions. No focal weakness, tongue bite or incontinence. Per notes, he was feverish, diaphoretic and EMS reported O2 sats in the 80s. Mental status improved en route to hospital. CT chest showed diffuse ground-glass  in the right lung that can be seen in atypical particularly viral infections, WBC 13,  he was treated with Rocephin and Zmax and mental status returned to normal very rapidly. The next event occurred on 03/02/2021, again after a day of activity playing golf. He ate breakfast and had snacks, went home to clean up. He recalls feeling  great when he lay down for a nap at 4pm, then woke up face down on the floor curled up in blankets around 6pm. He had difficulty getting up initially but managed to stand feeling woozy. His other son called and came, he felt fine but had a large abrasion on the bridge of his nose and bit his tongue, so they decided to go to the ER. Bloodwork showed WBC 11.1, Na 133. UDS, EtOH negative. I personally reviewed head CT without contrast which did not show any acute changes. Symptoms felt concerning for seizure so he was discharged home on Levetiracetam '500mg'$  BID which makes him a little drowsy.  He has had brief body jerks in the past 3-5 years. He and his son deny any staring/unresponsive episodes. He denies any olfactory/gustatory hallucinations, deja vu, rising epigastric sensation, focal numbness/tingling/weakness. He has a "little problem with balance" and does balance exercises regularly. He denies any headaches, diplopia, dysarthria/dysphagia, neck pain, bowel/bladder dysfunction. His back bothers him some. He feels his memory is pretty good for his age, he makes notes all the time. He denies missing medications, bill payments, or getting lost driving. 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 He had a normal birth and early development.  There is no history of febrile convulsions, CNS infections such as meningitis/encephalitis, significant traumatic brain injury, or family history of seizures. He drinks about 1.5 glasses of alcohol daily and recalls having a little more during the dance festival in 10/2020 and one beer after his golf game on 03/02/21. His son notes a fall 2 weeks ago after he had 4 drinks, he fell getting out of the car in the dark, no loss of consciousness. He reports high anxiety with very infrequent palpitations, no chest pain or shortness of breath.    PAST MEDICAL HISTORY: Past Medical History:  Diagnosis Date   Arthritis     Asthma    Cancer ([place])    prostate   GERD (gastroesophageal reflux disease)    Guillain-Barre syndrome ([place]) 02/25/1998   Hyperlipidemia    Hypertension    Seizures ([facility])     MEDICATIONS: Current Outpatient Medications on File Prior to Visit  Medication Sig Dispense Refill   Cyanocobalamin (VITAMIN B12) 500 MCG TABS Take 1 tablet by mouth daily.     diltiazem (TIAZAC) 120 MG 24 hr capsule Take 1 capsule (120 mg total) by mouth every morning. 30 capsule 5   hydrochlorothiazide (MICROZIDE) 12.5 MG capsule Take 1 capsule (12.5 mg total) by mouth daily. 30 capsule 2   ketoconazole (NIZORAL) 2 % cream SMARTSIG:1 Application Topical 1 to 2 Times Daily     levETIRAcetam (KEPPRA) 500 MG tablet Take 1 tablet (500 mg total) by mouth 2 (two) times daily. 180 tablet 3   losartan (COZAAR) 100 MG tablet Take 1 tablet (100 mg total) by mouth every evening. 30 tablet 5   omeprazole (PRILOSEC) 20 MG capsule TAKE 1 CAPSULE BY MOUTH TWICE A DAY 180 capsule 0   Oxymetazoline HCl (NASAL SPRAY) 0.05 % SOLN Place into the nose.     rivaroxaban (XARELTO) 20 MG TABS  tablet Take 1 tablet (20 mg total) by mouth daily with supper. 90 tablet 0   traMADol (ULTRAM) 50 MG tablet Take 50 mg by mouth every 6 (six) hours as needed.     zinc gluconate 50 MG tablet Take 50 mg by mouth daily as needed.     No current facility-administered medications on file prior to visit.    ALLERGIES: No Known Allergies  FAMILY HISTORY: Family History  Problem Relation Age of Onset   Alcohol abuse Father    Hyperlipidemia Brother    Stroke Brother    Diabetes Brother        type ll   Bladder Cancer Brother    Diabetes Brother     SOCIAL HISTORY: Social History   Socioeconomic History   Marital status: Divorced    Spouse name: Not on file   Number of children: 3   Years of education: Not on file   Highest education level: Some college, no degree  Occupational History   Occupation: retired  Tobacco Use   Smoking  status: Never   Smokeless tobacco: Never  Vaping Use   Vaping Use: Never used  Substance and Sexual Activity   Alcohol use: Yes    Alcohol/week: 5.0 standard drinks of alcohol    Types: 5 Standard drinks or equivalent per week    Comment: quit drinking wine, now drinks 1 liquor drink a night   Drug use: No   Sexual activity: Not Currently  Other Topics Concern   Not on file  Social History Narrative   Lives alone in one level. Has two sons 1 daughter and friends who serve as support.   Former Chiropodist; Enjoys Data processing manager, reading   Originally from [place]   Right handed    Caffeine 1 cup daily   Social Determinants of Health   Financial Resource Strain: Low Risk  (12/18/2021)   Overall Financial Resource Strain (CARDIA)    Difficulty of Paying Living Expenses: Not hard at all  Food Insecurity: No Food Insecurity (12/05/2021)   Hunger Vital Sign    Worried About Running Out of Food in the Last Year: Never true    Ran Out of Food in the Last Year: Never true  Transportation Needs: No Transportation Needs (06/27/2021)   PRAPARE - Hydrologist (Medical): No    Lack of Transportation (Non-Medical): No  Physical Activity: Sufficiently Active (06/27/2021)   Exercise Vital Sign    Days of Exercise per Week: 7 days    Minutes of Exercise per Session: 30 min  Stress: No Stress Concern Present (06/27/2021)   [place]    Feeling of Stress : Not at all  Social Connections: Moderately Integrated (06/27/2021)   Social Connection and Isolation Panel [NHANES]    Frequency of Communication with Friends and Family: More than three times a week    Frequency of Social Gatherings with Friends and Family: More than three times a week    Attends Religious Services: More than 4 times per year    Active Member of [employer] or Organizations: Yes    Attends Archivist Meetings: More than 4 times per  year    Marital Status: Never married  Intimate Partner Violence: Not At Risk (06/27/2021)   Humiliation, Afraid, Rape, and Kick questionnaire    Fear of Current or Ex-Partner: No    Emotionally Abused: No    Physically Abused: No  Sexually Abused: No     PHYSICAL EXAM: Vitals:   03/04/22 1349  BP: 130/68  Pulse: 66  SpO2: 97%   General: No acute distress Head:  Normocephalic/atraumatic Skin/Extremities: No rash, no edema Neurological Exam: alert and awake. No aphasia or dysarthria. Fund of knowledge is appropriate.  Attention and concentration are normal.   Cranial nerves: Pupils equal, round. Extraocular movements intact with no nystagmus. Visual fields full.  No facial asymmetry.  Motor: Bulk and tone normal, muscle strength 5/5 throughout with no pronator drift.   Finger to nose testing intact.  Gait slow and cautious, no ataxia. No tremor.   IMPRESSION: This is a pleasant 82 yo RH man with a history of hypertension, hyperlipidemia, prostate cancer, with 2 episodes of loss of consciousness that occurred in 10/2020 and 03/02/2021. Both occurred while taking a nap after physical activity. He was started on Levetiracetam '500mg'$  BID in the ER after the second event. His 1-hour EEG is normal. He has had a cardiac workup, with nocturnal pauses and one episode with pause and complete heart block along with intermittent paroxysmal atrial fibrillation. He was started on Eliquis. Cardiac electrophysiology suspected his episodes of pauses/transient complete heart block was likely due to vagal medicated events and did not recommend pacemaker. I discussed with him that the episodes are more likely cardiac in origin, however seizure is still a possibility. No further loss of consciousness since 02/2021, no near syncope since 04/2021. He expressed interest in weaning down Levetiracetam. We had an extensive discussion about risks of any medication adjustment, he would like to go ahead and reduce to  Levetiracetam '500mg'$  qhs. We discussed avoidance of seizure triggers. He is aware of [place] driving laws to stop driving after an episode of loss of consciousness until 6 months event-free. We discussed that he does not have any symptoms concerning for CTE at this time. Follow-up in 6 months, call for any changes.    Thank you for allowing me to participate in his care.  Please do not hesitate to call for any questions or concerns.    Ellouise Newer, M.D.   CC: Dr. Elease Hashimoto

## 2022-03-04 NOTE — Patient Instructions (Signed)
Always a pleasure to see you. Let's reduce the Levetiracetam (Keppra) '500mg'$ : take 1 tablet every night. Continue to monitor symptoms. Follow-up in 6 months, call for any changes.   Seizure Precautions: 1. If medication has been prescribed for you to prevent seizures, take it exactly as directed.  Do not stop taking the medicine without talking to your doctor first, even if you have not had a seizure in a long time.   2. Avoid activities in which a seizure would cause danger to yourself or to others.  Don't operate dangerous machinery, swim alone, or climb in high or dangerous places, such as on ladders, roofs, or girders.  Do not drive unless your doctor says you may.  3. If you have any warning that you may have a seizure, lay down in a safe place where you can't hurt yourself.    4.  No driving for 6 months from last seizure, as per [facility].   Please refer to the following link on the [place] website for more information: http://[email or web address].org/[email or web address]/driving/drivingu.cfm   5.  Maintain good sleep hygiene. Avoid alcohol.  6.  Contact your doctor if you have any problems that may be related to the medicine you are taking.  7.  Call 911 and bring the patient back to the ED if:        A.  The seizure lasts longer than 5 minutes.       B.  The patient doesn't awaken shortly after the seizure  C.  The patient has new problems such as difficulty seeing, speaking or moving  D.  The patient was injured during the seizure  E.  The patient has a temperature over 102 F (39C)  F.  The patient vomited and now is having trouble breathing

## 2022-03-05 ENCOUNTER — Ambulatory Visit: Payer: PPO | Admitting: Cardiology

## 2022-03-12 ENCOUNTER — Ambulatory Visit (INDEPENDENT_AMBULATORY_CARE_PROVIDER_SITE_OTHER): Payer: PPO

## 2022-03-12 DIAGNOSIS — I10 Essential (primary) hypertension: Secondary | ICD-10-CM

## 2022-03-12 DIAGNOSIS — I480 Paroxysmal atrial fibrillation: Secondary | ICD-10-CM

## 2022-03-12 NOTE — Plan of Care (Signed)
Chronic Care Management Provider Comprehensive Care Plan    03/12/2022 Name: Drew Smith MRN: [id number] DOB: 01-21-1941  Referral to Chronic Care Management (CCM) services was placed by Provider:  [name], MD on Date: 01/03/22.  Chronic Condition 1: HTN Provider Assessment and Plan  Hypertension. Currently well controlled today. He is not consistently taking his amlodipine. Recommend close home monitoring. Stressed importance of taking his blood pressure medication daily.   Expected Outcome/Goals Addressed This Visit (Provider CCM goals/Provider Assessment and plan  Goal: CCM (Hypertension) Expected Outcome:  Monitor, Self-Manage And Reduce Symptoms of Hypertension  Symptom Management Condition 1: Take all medications as prescribed Attend all scheduled provider appointments Call pharmacy for medication refills 3-7 days in advance of running out of medications Check blood pressure  Keep a blood pressure log Call doctor for signs and symptoms of high blood pressure Read nutrition labels. Eat whole grains, fruits and vegetables, lean meats and healthy fats Limit salt intake  Call provider office for new concerns or questions     Chronic Condition 2: Atrial Fibrillation Provider Assessment and Plan   History of paroxysmal atrial fibrillation.  Currently regular sinus rhythm.  Stressed importance of twice daily dosing with Eliquis.  We did discuss alternative of Xarelto (once daily for hopefully improve compliance) but he is concerned about cost.  He will check on coverage with his current insurance plan.    Expected Outcome/Goals Addressed This Visit (Provider CCM goals/Provider Assessment and plan  Goal: CCM (Atrial Fibrillation) Expected Outcome:  Monitor, Self-Manage And Reduce Symptoms of Atrial Fibrillation  Symptom Management Condition 2: Take medications exactly as prescribed Monitor pulse (heart) rate daily Assess symptoms daily Seek medical attention if  you experience a head injury or if there is blood in the urine or stool Attend appointment with the Cardiology team as scheduled Contact the clinic with new symptoms or concerns  Problem List Patient Active Problem List   Diagnosis Date Noted   Actinic keratosis 01/02/2022   Hypokalemia 05/06/2021   CHB (complete heart block) ([place]) 05/05/2021   Syncope 05/04/2021   Paroxysmal atrial fibrillation ([place]) 05/04/2021   History of seizures 05/04/2021   Malignant melanoma of left forearm ([place]) 10/19/2019   Upper respiratory tract infection 02/14/2017   Porokeratosis 09/07/2014   Metatarsal deformity 09/07/2014   Pain in lower limb 09/07/2014   Squamous cell carcinoma in situ of skin 10/15/2013   Hives 10/06/2012   History of Guillain-Barre syndrome 03/07/2011   Hypertension 11/28/2010   Dyslipidemia 11/28/2010   ERECTILE DYSFUNCTION 01/05/2008   ASTHMA 10/28/2007   GERD 10/28/2007   DIZZINESS 10/28/2007   COUGH 10/28/2007   FASTING HYPERGLYCEMIA 10/28/2007   PROSTATE CANCER, HX OF 10/28/2007    Medication Management  Current Outpatient Medications:    diltiazem (TIAZAC) 120 MG 24 hr capsule, Take 1 capsule (120 mg total) by mouth every morning., Disp: 30 capsule, Rfl: 5   hydrochlorothiazide (MICROZIDE) 12.5 MG capsule, Take 1 capsule (12.5 mg total) by mouth daily., Disp: 30 capsule, Rfl: 2   losartan (COZAAR) 100 MG tablet, Take 1 tablet (100 mg total) by mouth every evening., Disp: 30 tablet, Rfl: 5   omeprazole (PRILOSEC) 20 MG capsule, TAKE 1 CAPSULE BY MOUTH TWICE A DAY, Disp: 180 capsule, Rfl: 0   Oxymetazoline HCl (NASAL SPRAY) 0.05 % SOLN, Place into the nose., Disp: , Rfl:    rivaroxaban (XARELTO) 20 MG TABS tablet, Take 1 tablet (20 mg total) by mouth daily with supper., Disp: 90 tablet,  Rfl: 0   zinc gluconate 50 MG tablet, Take 50 mg by mouth daily as needed., Disp: , Rfl:    Cyanocobalamin (VITAMIN B12) 500 MCG TABS, Take 1 tablet by mouth daily. (Patient not taking:  Reported on 03/12/2022), Disp: , Rfl:    ketoconazole (NIZORAL) 2 % cream, SMARTSIG:1 Application Topical 1 to 2 Times Daily, Disp: , Rfl:    levETIRAcetam (KEPPRA) 500 MG tablet, Take 1 tablet twice a day, Disp: 180 tablet, Rfl: 3   traMADol (ULTRAM) 50 MG tablet, Take 50 mg by mouth every 6 (six) hours as needed., Disp: , Rfl:   Cognitive Assessment Identity Confirmed: : Name; DOB Cognitive Status: Normal   Functional Assessment Hearing Difficulty or Deaf: no Wear Glasses or Blind: yes Vision Management: Wears prescription glasses Concentrating, Remembering or Making Decisions Difficulty (CP): no Difficulty Communicating: no Difficulty Eating/Swallowing: no Walking or Climbing Stairs Difficulty: no Dressing/Bathing Difficulty: no Doing Errands Independently Difficulty (such as shopping) (CP): yes Errands Management: Reports son available to assist if needed Change in Functional Status Since Onset of Current Illness/Injury: no   Caregiver Assessment  Primary Source of Support/Comfort: child(ren); community People in Home: alone Family Caregiver if Needed: none   Planned Interventions  Atrial Fibrillation Reviewed plan for management of Atrial Fibrillation.  Reviewed medications. Reviewed importance of adherence to anticoagulant exactly as prescribed. Reports taking Xarelto as prescribed. Denies concerns regarding tolerance. Agreed to keep the team updated of concerns related to prescription cost. Discussed bleeding risk associated with Xarelto and importance of self-monitoring for signs/symptoms of bleeding. Discussed importance of avoiding NSAIDs due to increased bleeding risk with anticoagulants. Discussed importance of completing laboratory monitoring as prescribed. Discussed importance of seeking immediate medical attention after a head injury or if there is blood in the urine/stool. Assessed social determinant of health barriers  Hypertension Reviewed current treatment  plan related to Hypertension, self-management, and adherence to plan as established by provider.  Reviewed medications and indications for use. Reports taking medications as prescribed. Denies concerns r/t medication management or prescription cost. Provided information regarding established blood pressure parameters along with indications for notifying a provider. Advised to monitor BP consistently and record readings.  Reviewed symptoms. Denies chest pain or palpitations. Denies headaches, dizziness, or visual changes.  Discussed compliance with recommended cardiac prudent diet. Encouraged to read nutrition labels, continue monitoring sodium intake, and avoid highly processed foods when possible.  Reviewed s/sx of heart attack, stroke and worsening symptoms that require immediate medical attention.   Interaction and coordination with outside resources, practitioners, and providers See CCM Referral  Care Plan: Printed and mailed to patient

## 2022-03-12 NOTE — Chronic Care Management (AMB) (Signed)
Chronic Care Management   CCM RN Visit Note  03/12/2022 Name: Drew Smith MRN: [id number] DOB: 13-Dec-1940  Subjective: Drew Smith is a 82 y.o. year old male who is a primary care patient of Burchette, Alinda Sierras, MD. The patient was referred to the Chronic Care Management team for assistance with care management needs subsequent to provider initiation of CCM services and plan of care.    Today's Visit:  Engaged with patient by telephone for initial visit.     SDOH Interventions Today    Flowsheet Row Most Recent Value  SDOH Interventions   Food Insecurity Interventions Intervention Not Indicated  Housing Interventions Intervention Not Indicated  Transportation Interventions Intervention Not Indicated  Utilities Interventions Intervention Not Indicated  Financial Strain Interventions Intervention Not Indicated  Physical Activity Interventions Intervention Not Indicated  Stress Interventions Intervention Not Indicated  Social Connections Interventions Intervention Not Indicated         Goals Addressed             This Visit's Progress    Goal: CCM (Atrial Fibrillation) Expected Outcome:  Monitor, Self-Manage And Reduce Symptoms of Atrial Fibrillation       Current Barriers:  Chronic Disease Management support and education needs related to Atrial Fibrillation  Planned Interventions: Reviewed plan for management of Atrial Fibrillation.  Reviewed medications. Reviewed importance of adherence to anticoagulant exactly as prescribed. Reports taking Xarelto as prescribed. Denies concerns regarding tolerance. Agreed to keep the team updated of concerns related to prescription cost. Discussed bleeding risk associated with Xarelto and importance of self-monitoring for signs/symptoms of bleeding. Discussed importance of avoiding NSAIDs due to increased bleeding risk with anticoagulants. Discussed importance of completing laboratory monitoring as prescribed. Discussed  importance of seeking immediate medical attention after a head injury or if there is blood in the urine/stool. Assessed social determinant of health barriers  Symptom Management: Take medications exactly as prescribed Monitor pulse (heart) rate daily Assess symptoms daily Seek medical attention if you experience a head injury or if there is blood in the urine or stool Attend appointment with the Cardiology team as scheduled Contact the clinic with new symptoms or concerns  Follow Up Plan:  Will follow up in three months         Goal: CCM (Hypertension) Expected Outcome:  Monitor, Self-Manage And Reduce Symptoms of Hypertension       Current Barriers:  Chronic Disease Management support and education needs related to Hypertension  Planned Interventions: Reviewed current treatment plan related to Hypertension, self-management, and adherence to plan as established by provider.  Reviewed medications and indications for use. Reports taking medications as prescribed. Denies concerns r/t medication management or prescription cost. Provided information regarding established blood pressure parameters along with indications for notifying a provider. Advised to monitor BP consistently and record readings.  Reviewed symptoms. Denies chest pain or palpitations. Denies headaches, dizziness, or visual changes.  Discussed compliance with recommended cardiac prudent diet. Encouraged to read nutrition labels, continue monitoring sodium intake, and avoid highly processed foods when possible.  Reviewed s/sx of heart attack, stroke and worsening symptoms that require immediate medical attention.   BP Readings from Last 3 Encounters:  03/04/22 130/68  01/01/22 (!) 102/52  01/01/22 (!) 159/67    Symptom Management: Take all medications as prescribed Attend all scheduled provider appointments Call pharmacy for medication refills 3-7 days in advance of running out of medications Check blood pressure   Keep a blood pressure log Call doctor for signs and symptoms  of high blood pressure Read nutrition labels. Eat whole grains, fruits and vegetables, lean meats and healthy fats Limit salt intake  Call provider office for new concerns or questions   Follow Up Plan:  Will follow up in three months           PLAN: Will follow up in three months   [place] Manager/Chronic Care Management [phone]

## 2022-03-12 NOTE — Patient Instructions (Addendum)
Thank you for allowing the Chronic Care Management team to participate in your care.    Our next outreach is scheduled for June 11, 2022 at 1000. Please call the care guide team at [phone] if you need to cancel or reschedule your appointment.    Following is a copy of the CCM Program Consent:  CCM service includes personalized support from designated clinical staff supervised by the physician, including individualized plan of care and coordination with other care providers 24/7 contact phone numbers for assistance for urgent and routine care needs. Service will only be billed when office clinical staff spend 20 minutes or more in a month to coordinate care. Only one practitioner may furnish and bill the service in a calendar month. The patient may stop CCM services at amy time (effective at the end of the month) by phone call to the office staff. The patient will be responsible for cost sharing (co-pay) or up to 20% of the service fee (after annual deductible is met)   Following is a copy of your full provider care plan:    Goals Addressed             This Visit's Progress    Goal: CCM (Atrial Fibrillation) Expected Outcome:  Monitor, Self-Manage And Reduce Symptoms of Atrial Fibrillation       Current Barriers:  Chronic Disease Management support and education needs related to Atrial Fibrillation  Planned Interventions: Reviewed plan for management of Atrial Fibrillation.  Reviewed medications. Reviewed importance of adherence to anticoagulant exactly as prescribed. Reports taking Xarelto as prescribed. Denies concerns regarding tolerance. Agreed to keep the team updated of concerns related to prescription cost. Discussed bleeding risk associated with Xarelto and importance of self-monitoring for signs/symptoms of bleeding. Discussed importance of avoiding NSAIDs due to increased bleeding risk with anticoagulants. Discussed importance of completing laboratory monitoring as  prescribed. Discussed importance of seeking immediate medical attention after a head injury or if there is blood in the urine/stool. Assessed social determinant of health barriers  Symptom Management: Take medications exactly as prescribed Monitor pulse (heart) rate daily Assess symptoms daily Seek medical attention if you experience a head injury or if there is blood in the urine or stool Attend appointment with the Cardiology team as scheduled Contact the clinic with new symptoms or concerns  Follow Up Plan:  Will follow up in three months         Goal: CCM (Hypertension) Expected Outcome:  Monitor, Self-Manage And Reduce Symptoms of Hypertension       Current Barriers:  Chronic Disease Management support and education needs related to Hypertension  Planned Interventions: Reviewed current treatment plan related to Hypertension, self-management, and adherence to plan as established by provider.  Reviewed medications and indications for use. Reports taking medications as prescribed. Denies concerns r/t medication management or prescription cost. Provided information regarding established blood pressure parameters along with indications for notifying a provider. Advised to monitor BP consistently and record readings.  Reviewed symptoms. Denies chest pain or palpitations. Denies headaches, dizziness, or visual changes.  Discussed compliance with recommended cardiac prudent diet. Encouraged to read nutrition labels, continue monitoring sodium intake, and avoid highly processed foods when possible.  Reviewed s/sx of heart attack, stroke and worsening symptoms that require immediate medical attention.   BP Readings from Last 3 Encounters:  03/04/22 130/68  01/01/22 (!) 102/52  01/01/22 (!) 159/67    Symptom Management: Take all medications as prescribed Attend all scheduled provider appointments Call pharmacy for medication refills  3-7 days in advance of running out of  medications Check blood pressure  Keep a blood pressure log Call doctor for signs and symptoms of high blood pressure Read nutrition labels. Eat whole grains, fruits and vegetables, lean meats and healthy fats Limit salt intake  Call provider office for new concerns or questions   Follow Up Plan:  Will follow up in three months          Drew Smith verbalized understanding of instructions, educational materials, and care plan provided. Agreed to receive a mailed copy of patient instructions and care plan.   A member of the care management team will follow up in three months.     Horris Latino RN Care Manager/Chronic Care Management [phone]

## 2022-03-14 NOTE — Telephone Encounter (Signed)
Followed up with patient regarding gum bleeding. Patient stated it comes and goes. He has an upcoming dental appointment and will call if episodes get worse or more frequent.

## 2022-03-14 NOTE — Telephone Encounter (Signed)
Truitt Merle, DO, [facility]

## 2022-03-19 DIAGNOSIS — Z85820 Personal history of malignant melanoma of skin: Secondary | ICD-10-CM | POA: Diagnosis not present

## 2022-03-19 DIAGNOSIS — Z85828 Personal history of other malignant neoplasm of skin: Secondary | ICD-10-CM | POA: Diagnosis not present

## 2022-03-19 DIAGNOSIS — D225 Melanocytic nevi of trunk: Secondary | ICD-10-CM | POA: Diagnosis not present

## 2022-03-19 DIAGNOSIS — L814 Other melanin hyperpigmentation: Secondary | ICD-10-CM | POA: Diagnosis not present

## 2022-03-19 DIAGNOSIS — L821 Other seborrheic keratosis: Secondary | ICD-10-CM | POA: Diagnosis not present

## 2022-03-19 DIAGNOSIS — Z08 Encounter for follow-up examination after completed treatment for malignant neoplasm: Secondary | ICD-10-CM | POA: Diagnosis not present

## 2022-03-19 DIAGNOSIS — R202 Paresthesia of skin: Secondary | ICD-10-CM | POA: Diagnosis not present

## 2022-03-19 DIAGNOSIS — L570 Actinic keratosis: Secondary | ICD-10-CM | POA: Diagnosis not present

## 2022-03-19 DIAGNOSIS — L905 Scar conditions and fibrosis of skin: Secondary | ICD-10-CM | POA: Diagnosis not present

## 2022-03-19 DIAGNOSIS — I872 Venous insufficiency (chronic) (peripheral): Secondary | ICD-10-CM | POA: Diagnosis not present

## 2022-03-27 DIAGNOSIS — I4891 Unspecified atrial fibrillation: Secondary | ICD-10-CM

## 2022-03-27 DIAGNOSIS — I10 Essential (primary) hypertension: Secondary | ICD-10-CM | POA: Diagnosis not present

## 2022-03-30 ENCOUNTER — Other Ambulatory Visit: Payer: Self-pay | Admitting: Cardiology

## 2022-03-30 DIAGNOSIS — Z7901 Long term (current) use of anticoagulants: Secondary | ICD-10-CM

## 2022-04-02 DIAGNOSIS — I10 Essential (primary) hypertension: Secondary | ICD-10-CM | POA: Diagnosis not present

## 2022-04-08 DIAGNOSIS — M9904 Segmental and somatic dysfunction of sacral region: Secondary | ICD-10-CM | POA: Diagnosis not present

## 2022-04-08 DIAGNOSIS — M5136 Other intervertebral disc degeneration, lumbar region: Secondary | ICD-10-CM | POA: Diagnosis not present

## 2022-04-08 DIAGNOSIS — M9903 Segmental and somatic dysfunction of lumbar region: Secondary | ICD-10-CM | POA: Diagnosis not present

## 2022-04-08 DIAGNOSIS — M545 Low back pain, unspecified: Secondary | ICD-10-CM | POA: Diagnosis not present

## 2022-04-09 NOTE — Progress Notes (Signed)
Care Management & Coordination Services Pharmacy Note  04/09/2022 Name:  Drew Smith MRN:  [id number] DOB:  [birth year]  Summary: Pt complains of occasional dizziness of unknown cause, not checking BP during these episodes. Recommend BP monitoring to determine if BP dropping too low.   Recommendations/Changes made from today's visit: -Continue daily BP monitoring with cardiology provided RPM device. Additional checks if feeling dizzy/lightheaded -Continue medication therapy and adequate water intake -Notify office of any signs of bleed  Follow up plan: Pharmacist visit in 6 months   Subjective: Drew Smith is an 82 y.o. year old male who is a primary patient of Burchette, Alinda Sierras, MD.  The care coordination team was consulted for assistance with disease management and care coordination needs.    Engaged with patient by telephone for follow up visit.  Recent office visits: 01/01/22 Drew Smith - For sternal mass. No medication changes  Recent consult visits: 03/04/22 Drew Sprang, MD (Neuro) For loss of consciousness. CHANGE Keppra 547m BID 01/01/22 Drew Tolia, DO (Cardio) For A-fib. STOP Eliquis 588m Xyzal 21m821mSTART Xarelto 36m30mce daily.  Hospital visits: None in previous 6 months   Objective:  Lab Results  Component Value Date   CREATININE 1.06 12/25/2021   BUN 16 12/25/2021   GFR 64.99 05/15/2021   EGFR 70 12/25/2021   GFRNONAA 56 (L) 05/06/2021   GFRAA 124 10/28/2007   NA 135 12/25/2021   K 4.7 12/25/2021   CALCIUM 9.2 12/25/2021   CO2 24 12/25/2021   GLUCOSE 109 (H) 12/25/2021    Lab Results  Component Value Date/Time   HGBA1C 5.5 05/15/2021 12:02 PM   HGBA1C 5.8 (A) 06/28/2020 04:16 PM   HGBA1C 5.4 12/08/2015 02:10 PM   GFR 64.99 05/15/2021 12:02 PM   GFR 80.97 02/09/2020 08:11 AM    Last diabetic Eye exam: No results found for: "HMDIABEYEEXA"  Last diabetic Foot exam: No results found for: "HMDIABFOOTEX"   Lab Results  Component  Value Date   CHOL 163 04/05/2019   HDL 47.60 04/05/2019   LDLCALC 95 04/05/2019   TRIG 103.0 04/05/2019   CHOLHDL 3 04/05/2019       Latest Ref Rng & Units 05/04/2021    8:18 PM 03/03/2021   12:44 AM 02/09/2020    8:11 AM  Hepatic Function  Total Protein 6.5 - 8.1 g/dL 6.6  6.6  6.5   Albumin 3.5 - 5.0 g/dL 3.6  3.7  3.8   AST 15 - 41 U/L 19  24  12   $ ALT 0 - 44 U/L 14  16  11   $ Alk Phosphatase 38 - 126 U/L 57  57  70   Total Bilirubin 0.3 - 1.2 mg/dL 0.5  0.8  0.6   Bilirubin, Direct 0.0 - 0.2 mg/dL  0.1      Lab Results  Component Value Date/Time   TSH 1.63 08/29/2020 04:11 PM   TSH 2.96 04/05/2019 10:18 AM       Latest Ref Rng & Units 09/13/2021   12:31 PM 05/06/2021   12:08 AM 05/04/2021    8:18 PM  CBC  WBC 4.0 - 10.5 K/uL  8.3  6.8   Hemoglobin 13.0 - 17.7 g/dL 14.0  13.7  14.3   Hematocrit 37.5 - 51.0 % 41.6  40.3  40.4   Platelets 150 - 400 K/uL  242  240     Lab Results  Component Value Date/Time   VITAMINB12 304 08/29/2020 04:11 PM   VITAMINB12  314 12/08/2015 02:10 PM    Clinical ASCVD: No  The ASCVD Risk score (Arnett DK, et al., 2019) failed to calculate for the following reasons:   The 2019 ASCVD risk score is only valid for ages 71 to 72        03/12/2022    1:39 PM 11/20/2021    2:04 PM 06/27/2021    3:55 PM  Depression screen PHQ 2/9  Decreased Interest 1 1 0  Down, Depressed, Hopeless 0 1 0  PHQ - 2 Score 1 2 0  Altered sleeping  1   Tired, decreased energy  1   Change in appetite  1   Feeling bad or failure about yourself   1   Trouble concentrating  2   Moving slowly or fidgety/restless  1   Suicidal thoughts  1   PHQ-9 Score  10   Difficult doing work/chores  Somewhat difficult      Social History   Tobacco Use  Smoking Status Never  Smokeless Tobacco Never   BP Readings from Last 3 Encounters:  03/04/22 130/68  01/01/22 (!) 102/52  01/01/22 (!) 159/67   Pulse Readings from Last 3 Encounters:  03/04/22 66  01/01/22 (!) 52   01/01/22 (!) 59   Wt Readings from Last 3 Encounters:  03/04/22 160 lb 3.2 oz (72.7 kg)  01/01/22 159 lb (72.1 kg)  01/01/22 158 lb (71.7 kg)   BMI Readings from Last 3 Encounters:  03/04/22 24.36 kg/m  01/01/22 24.18 kg/m  01/01/22 24.02 kg/m    No Known Allergies  Medications Reviewed Today     Reviewed by Neldon Labella, RN (Registered Nurse) on 03/12/22 at 26  Med List Status: <None>   Medication Order Taking? Sig Documenting Provider Last Dose Status Informant  Cyanocobalamin (VITAMIN B12) 500 MCG TABS [id number]  Take 1 tablet by mouth daily. [provider]  Active   diltiazem (TIAZAC) 120 MG 24 hr capsule [id number] Yes Take 1 capsule (120 mg total) by mouth every morning. Terri Skains, Sunit, DO Taking Active   hydrochlorothiazide (MICROZIDE) 12.5 MG capsule [id number] Yes Take 1 capsule (12.5 mg total) by mouth daily. Terri Skains, Sunit, DO Taking Active   ketoconazole (NIZORAL) 2 % cream [id number]  SMARTSIG:1 Application Topical 1 to 2 Times Daily [provider]  Active   levETIRAcetam (KEPPRA) 500 MG tablet [id number]  Take 1 tablet twice a day Drew Sprang, MD  Active   losartan (COZAAR) 100 MG tablet [id number] Yes Take 1 tablet (100 mg total) by mouth every evening. Rex Kras, DO Taking Active   omeprazole (PRILOSEC) 20 MG capsule [id number]  TAKE 1 CAPSULE BY MOUTH TWICE A DAY Burchette, Alinda Sierras, MD  Active   Oxymetazoline HCl (NASAL SPRAY) 0.05 % SOLN [id number]  Place into the nose. [provider]  Active Self, Pharmacy Records  rivaroxaban (XARELTO) 20 MG TABS tablet [id number]  Take 1 tablet (20 mg total) by mouth daily with supper. Terri Skains, Sunit, DO  Active   traMADol (ULTRAM) 50 MG tablet [id number]  Take 50 mg by mouth every 6 (six) hours as needed. [provider]  Active Self, Pharmacy Records  zinc gluconate 50 MG tablet [id number]  Take 50 mg by mouth daily as needed. [provider]  Active Self, Pharmacy Records             SDOH:  (Social Determinants of Health) assessments and interventions performed: Yes SDOH Interventions    Flowsheet Row Chronic Care Management from 03/12/2022 in  [place] at [place] Management from 12/18/2021 in [place] at [place] from 12/05/2021 in [place] Coordination Clinical Support from 06/27/2021 in [place] at [place] from 06/01/2020 in [place] at [place] Management from 03/16/2020 in [place] at [place] Interventions Intervention Not Indicated -- -- Intervention Not Indicated Intervention Not Indicated --  Housing Interventions Intervention Not Indicated -- Intervention Not Indicated Intervention Not Indicated Intervention Not Indicated --  Transportation Interventions Intervention Not Indicated -- -- Intervention Not Indicated -- Intervention Not Indicated  Utilities Interventions Intervention Not Indicated -- Intervention Not Indicated -- -- --  Financial Strain Interventions Intervention Not Indicated Intervention Not Indicated -- Intervention Not Indicated Intervention Not Indicated Intervention Not Indicated  Physical Activity Interventions Intervention Not Indicated -- -- Intervention Not Indicated Intervention Not Indicated --  Stress Interventions Intervention Not Indicated -- -- Intervention Not Indicated Intervention Not Indicated --  Social Connections Interventions Intervention Not Indicated -- -- Intervention Not Indicated Intervention Not Indicated --       Medication Assistance: None required.  Patient affirms current coverage meets needs.  Medication Access: Within the past 30 days, how often has patient missed a dose of medication? 2-3x/month Is a pillbox or other method used to improve adherence? Yes  Factors  that may affect medication adherence? no barriers identified Are meds synced by current pharmacy? No  Are meds delivered by current pharmacy? No  Does patient experience delays in picking up medications due to transportation concerns? No   [facility] Services Reviewed: Is patient disadvantaged to use [facility]?: No  Current Rx insurance plan: HTA Name and location of Current pharmacy:  [facility] [id number], [place] [address] Phone: [phone] Fax: [phone]  [facility] services reviewed with patient today?: No  Patient requests to transfer care to [facility]?: No  Reason patient declined to change pharmacies: Not mentioned at this visit  Compliance/Adherence/Medication fill history: Care Gaps: Vaccines: COVID, Tdap, Shingles  Star-Rating Drugs: Losartan 142m PDC 74%   Assessment/Plan   Hypertension (BP goal <140/90) -Controlled -Current treatment: HCTZ 12.537m1qam Appropriate, Effective, Safe, Accessible Losartan 10048m qpm Appropriate, Effective, Safe, Accessible -Medications previously tried: Amlodipine  -Current home readings: 2-3x daily -Current dietary habits: mindful of salt -Current exercise habits: plays golf multiple times per day -Reports hypotensive/hypertensive symptoms  -Pt gets dizzy at times, has not been checking his BP on these occasions, recommend BP checks at these times to determine if BP dropping too low -Educated on BP goals and benefits of medications for prevention of heart attack, stroke and kidney damage; Daily salt intake goal < 2300 mg; Exercise goal of 150 minutes per week; Importance of home blood pressure monitoring; -Counseled to monitor BP at home daily, document, and provide log at future appointments -Recommended to continue current medication  Atrial Fibrillation (Goal: prevent stroke and major bleeding) -Controlled -CHA2DS2VASC:  3 -Current treatment: Rhythm control: Diltiazem 120m63mqam Appropriate, Effective, Safe, Accessible Anticoagulation: Xarelto 20mg49mh supper Appropriate, Effective, Safe, Accessible -Medications previously tried: None -Home BP and HR readings: see above  -Counseled on increased risk of stroke due to Afib and benefits of anticoagulation for stroke prevention; importance of adherence to anticoagulant exactly as prescribed; avoidance of NSAIDs due to increased bleeding risk with  anticoagulants; seeking medical attention after a head injury or if there is blood in the urine/stool; -Recommended to continue current medication  [place] Pharmacist [phone]

## 2022-04-11 DIAGNOSIS — M5136 Other intervertebral disc degeneration, lumbar region: Secondary | ICD-10-CM | POA: Diagnosis not present

## 2022-04-11 DIAGNOSIS — M9904 Segmental and somatic dysfunction of sacral region: Secondary | ICD-10-CM | POA: Diagnosis not present

## 2022-04-11 DIAGNOSIS — M9903 Segmental and somatic dysfunction of lumbar region: Secondary | ICD-10-CM | POA: Diagnosis not present

## 2022-04-11 DIAGNOSIS — M545 Low back pain, unspecified: Secondary | ICD-10-CM | POA: Diagnosis not present

## 2022-04-15 ENCOUNTER — Telehealth: Payer: Self-pay

## 2022-04-15 NOTE — Progress Notes (Signed)
Care Management & Coordination Services Pharmacy Team  Reason for Encounter: Appointment Reminder  Attempted to contacted patient to confirm telephone appointment with Burman Riis, PharmD on 04/16/2022 at 8:30. Unsuccessful outreach. Left voicemail for patient to return call.  Do you have any problems getting your medications?  If yes what types of problems are you experiencing?   What is your top health concern you would like to discuss at your upcoming visit?   Have you seen any other providers since your last visit with PCP?   Care Gaps: AWV - completed 06/27/2021, scheduled 07/01/2022 Covid - never done Tdap - never done Sihngrix - never done Pneumovax - postponed Flu - postponed   Star Rating Drug: Losartan 100 mg - last filled 04/12/2022 30 DS at [facility]  [place] Pharmacist Assistant [phone]

## 2022-04-16 ENCOUNTER — Ambulatory Visit: Payer: PPO

## 2022-04-16 NOTE — Progress Notes (Signed)
Encounter details: CCM Time Spent     None       As the supervising physician/advanced practice provider, I certify that I have collaborated with the care management team and personally reviewed and directed the care plan for this patient's chronic conditions.         [name] MD [facility]

## 2022-04-30 ENCOUNTER — Ambulatory Visit (INDEPENDENT_AMBULATORY_CARE_PROVIDER_SITE_OTHER): Payer: PPO | Admitting: Family Medicine

## 2022-04-30 VITALS — BP 124/60 | HR 69 | Temp 98.3°F | Ht 68.0 in | Wt 158.8 lb

## 2022-04-30 DIAGNOSIS — B351 Tinea unguium: Secondary | ICD-10-CM

## 2022-04-30 DIAGNOSIS — L821 Other seborrheic keratosis: Secondary | ICD-10-CM | POA: Diagnosis not present

## 2022-04-30 MED ORDER — CICLOPIROX 8 % EX SOLN: Freq: Every day | CUTANEOUS | 11 refills | Status: DC

## 2022-04-30 NOTE — Progress Notes (Signed)
   Acute Office Visit  Subjective:     Patient ID: Drew Smith, male    DOB: 1940/09/01, 82 y.o.   MRN: [id number]  Chief Complaint  Patient presents with   Foot Pain    Patient complains of left foot pain, questionable ingrown toenail x1 month   Rash    Patient complains of rash or lesion left elbow xmonths    Foot Pain Associated symptoms include a rash. Pertinent negatives include no chills or fever.  Rash Pertinent negatives include no fever.   Patient is in today for left foot pain states it has been going on for about month, in his two toes. States that he had a pedicure last week but was told he needed to see a doctor about it. He reports it is the 4th and 5th digits that are most painful, thinks the toenail is rubbing against the other toe causing the pain.   Pt is also asking to have some skin lesions removed today, there are 2 on his right cheek and 1 on his left elbow.   Review of Systems  Constitutional:  Negative for chills and fever.  Skin:  Positive for rash.  All other systems reviewed and are negative.       Objective:    BP 124/60 (BP Location: Left Arm, Patient Position: Sitting, Cuff Size: Normal)   Pulse 69   Temp 98.3 F (36.8 C) (Oral)   Ht '5\' 8"'$  (1.727 m)   Wt 158 lb 12.8 oz (72 kg)   SpO2 98%   BMI 24.15 kg/m    Physical Exam Cardiovascular:     Pulses:          Dorsalis pedis pulses are 2+ on the left side.       Posterior tibial pulses are 2+ on the left side.  Musculoskeletal:     Left foot: No deformity.  Feet:     Left foot:     Skin integrity: Skin integrity normal.     Toenail Condition: Left toenails are abnormally thick. Fungal disease present.   Cryotherapy Procedure note:  After obtaining verbal consent from the patient and discussing potential risks/side effects, we proceeded with 2 freeze thaw cycles on 3 separate lesions, one one the left elbow and 2 on the right cheek. 2 freeze thaw cycles were completed per  lesion. Patient tolerated the procedure well and he was given care instructions.   No results found for any visits on 04/30/22.      Assessment & Plan:   Problem List Items Addressed This Visit   None Visit Diagnoses     Onychomycosis    -  Primary   Relevant Medications   ciclopirox (PENLAC) 8 % solution   Seborrheic keratoses         Treating the fungal infection of the toenails with ciclopirox daily until new healthy nail begins to grow. If pt does not have improvement of his symptoms then will send to podiatry.   Pt had 3 seborrheic keratoses treated today with cryotherapy.  Follow up PRN  Meds ordered this encounter  Medications   ciclopirox (PENLAC) 8 % solution    Sig: Apply topically at bedtime. Apply over nail and surrounding skin. Apply daily over previous coat. After seven (7) days, may remove with alcohol and continue cycle.    Dispense:  6.6 mL    Refill:  11    No follow-ups on file.  Farrel Conners, MD

## 2022-05-02 DIAGNOSIS — M5136 Other intervertebral disc degeneration, lumbar region: Secondary | ICD-10-CM | POA: Diagnosis not present

## 2022-05-02 DIAGNOSIS — M9904 Segmental and somatic dysfunction of sacral region: Secondary | ICD-10-CM | POA: Diagnosis not present

## 2022-05-02 DIAGNOSIS — M9903 Segmental and somatic dysfunction of lumbar region: Secondary | ICD-10-CM | POA: Diagnosis not present

## 2022-05-02 DIAGNOSIS — I10 Essential (primary) hypertension: Secondary | ICD-10-CM | POA: Diagnosis not present

## 2022-05-02 DIAGNOSIS — M545 Low back pain, unspecified: Secondary | ICD-10-CM | POA: Diagnosis not present

## 2022-05-02 DIAGNOSIS — I4891 Unspecified atrial fibrillation: Secondary | ICD-10-CM | POA: Diagnosis not present

## 2022-05-14 ENCOUNTER — Telehealth: Payer: Self-pay

## 2022-05-14 DIAGNOSIS — I480 Paroxysmal atrial fibrillation: Secondary | ICD-10-CM

## 2022-05-14 DIAGNOSIS — I10 Essential (primary) hypertension: Secondary | ICD-10-CM

## 2022-05-14 DIAGNOSIS — I422 Other hypertrophic cardiomyopathy: Secondary | ICD-10-CM

## 2022-05-14 MED ORDER — LOSARTAN POTASSIUM 100 MG PO TABS: 100.0000 mg | ORAL_TABLET | Freq: Every evening | ORAL | 1 refills | Status: DC

## 2022-05-14 MED ORDER — HYDROCHLOROTHIAZIDE 12.5 MG PO CAPS: 12.5000 mg | ORAL_CAPSULE | Freq: Every day | ORAL | 1 refills | Status: DC

## 2022-05-14 MED ORDER — DILTIAZEM HCL ER BEADS 120 MG PO CP24: 120.0000 mg | ORAL_CAPSULE | Freq: Every morning | ORAL | 1 refills | Status: DC

## 2022-05-14 NOTE — Telephone Encounter (Signed)
Patient reccommended by ophthalmologist Dr. Simonne Come [phone]) to hold Xarelto for max 1 week (resume sooner if issue resolves sooner than 1 week) for a blood blister in eye.  Reviewed stroke symptoms with patient and instructed him to call 911 if he experiences any of the following especially during this week of holding Xarelto - facial dropping, slurred speech or arm weakness.   Patient also requested refills  of blood pressure medications. Patient home BP average is 127/69 on current regimen.

## 2022-06-01 DIAGNOSIS — I10 Essential (primary) hypertension: Secondary | ICD-10-CM | POA: Diagnosis not present

## 2022-06-01 DIAGNOSIS — I4891 Unspecified atrial fibrillation: Secondary | ICD-10-CM | POA: Diagnosis not present

## 2022-06-11 ENCOUNTER — Ambulatory Visit (INDEPENDENT_AMBULATORY_CARE_PROVIDER_SITE_OTHER): Payer: PPO

## 2022-06-11 DIAGNOSIS — I480 Paroxysmal atrial fibrillation: Secondary | ICD-10-CM

## 2022-06-11 DIAGNOSIS — I10 Essential (primary) hypertension: Secondary | ICD-10-CM

## 2022-06-11 NOTE — Chronic Care Management (AMB) (Signed)
Chronic Care Management   CCM RN Visit Note  06/11/2022 Name: Drew Smith MRN: [id number] DOB: 28-Feb-1940  Subjective: Drew Smith is a 82 y.o. year old male who is a primary care patient of Burchette, Elberta Fortis, MD. The patient was referred to the Chronic Care Management team for assistance with care management needs subsequent to provider initiation of CCM services and plan of care.    Today's Visit:  Engaged with patient by telephone for follow up visit.       Goals Addressed             This Visit's Progress    Goal: CCM (Atrial Fibrillation) Expected Outcome:  Monitor, Self-Manage And Reduce Symptoms of Atrial Fibrillation       Current Barriers:  Chronic Disease Management support and education needs related to Atrial Fibrillation  Planned Interventions: Reviewed plan for management of Atrial Fibrillation.  Reviewed medications. Reviewed importance of adherence to anticoagulant exactly as prescribed. Reports taking Xarelto as prescribed. Denies concerns regarding tolerance. Agreed to keep the team updated of concerns related to prescription cost. Discussed bleeding risk associated with Xarelto and importance of self-monitoring for signs/symptoms of bleeding. Discussed importance of avoiding NSAIDs due to increased bleeding risk with anticoagulants. Discussed importance of completing laboratory monitoring as prescribed. Discussed importance of seeking immediate medical attention after a head injury or if there is blood in the urine/stool. Assessed social determinant of health barriers Update: Reports continued compliance with treatment plan. Reports a few episodes of dizziness but overall reports symptoms have been controlled. Reports doing well.  Symptom Management: Take medications exactly as prescribed Monitor pulse (heart) rate daily Assess symptoms daily Seek medical attention if you experience a head injury or if there is blood in the urine or stool Attend  appointment with the Cardiology team as scheduled Contact the clinic with new symptoms or concerns  Follow Up Plan:  Will follow up in three months         Goal: CCM (Hypertension) Expected Outcome:  Monitor, Self-Manage And Reduce Symptoms of Hypertension       Current Barriers:  Chronic Disease Management support and education needs related to Hypertension  Planned Interventions: Reviewed current treatment plan related to Hypertension, self-management, and adherence to plan as established by provider.  Reports compliance with treatment plan. Reports taking medications as prescribed.  Reviewed blood pressure readings. Reports systolic readings have ranged in the 110s to 120s over 60s. Reviewed parameters and indications for notifying a provider.  Reviewed symptoms. Denies chest pain or palpitations. Denies headaches. Reports a few episodes of dizziness. Reports it has not impacted his ability to complete activities. Denies visual changes. Reviewed compliance with cardiac prudent diet. Encouraged to read nutrition labels, continue monitoring sodium intake, and avoid highly processed foods when possible.  Reviewed s/sx of heart attack, stroke and worsening symptoms that require immediate medical attention.   BP Readings from Last 3 Encounters:  04/30/22 124/60  03/04/22 130/68  01/01/22 (!) 102/52     BP Readings from Last 3 Encounters:  03/04/22 130/68  01/01/22 (!) 102/52  01/01/22 (!) 159/67    Symptom Management: Take all medications as prescribed Attend all scheduled provider appointments Call pharmacy for medication refills 3-7 days in advance of running out of medications Check blood pressure  Keep a blood pressure log Call doctor for signs and symptoms of high blood pressure Read nutrition labels. Eat whole grains, fruits and vegetables, lean meats and healthy fats Limit salt intake  Call  provider office for new concerns or questions   Follow Up Plan:  Will follow  up in three months           PLAN: Will follow up in three months.   [facility]/Chronic Care Management [phone]

## 2022-06-25 DIAGNOSIS — I4891 Unspecified atrial fibrillation: Secondary | ICD-10-CM | POA: Diagnosis not present

## 2022-06-25 DIAGNOSIS — I10 Essential (primary) hypertension: Secondary | ICD-10-CM | POA: Diagnosis not present

## 2022-06-27 DIAGNOSIS — H26492 Other secondary cataract, left eye: Secondary | ICD-10-CM | POA: Diagnosis not present

## 2022-06-27 DIAGNOSIS — H16223 Keratoconjunctivitis sicca, not specified as Sjogren's, bilateral: Secondary | ICD-10-CM | POA: Diagnosis not present

## 2022-07-01 ENCOUNTER — Ambulatory Visit (INDEPENDENT_AMBULATORY_CARE_PROVIDER_SITE_OTHER): Payer: PPO

## 2022-07-01 VITALS — Ht 68.0 in | Wt 158.0 lb

## 2022-07-01 DIAGNOSIS — Z0000 Encounter for general adult medical examination without abnormal findings: Secondary | ICD-10-CM | POA: Diagnosis not present

## 2022-07-01 DIAGNOSIS — I10 Essential (primary) hypertension: Secondary | ICD-10-CM | POA: Diagnosis not present

## 2022-07-01 NOTE — Progress Notes (Signed)
Subjective:   Drew Smith is a 82 y.o. male who presents for Medicare Annual/Subsequent preventive examination.  Review of Systems    Virtual Visit via Telephone Note  I connected with  [name] on 07/01/22 at  3:45 PM EDT by telephone and verified that I am speaking with the correct person using two identifiers.  Location: Patient: Home Provider: Office Persons participating in the virtual visit: patient/Nurse Health Advisor   I discussed the limitations, risks, security and privacy concerns of performing an evaluation and management service by telephone and the availability of in person appointments. The patient expressed understanding and agreed to proceed.  Interactive audio and video telecommunications were attempted between this nurse and patient, however failed, due to patient having technical difficulties OR patient did not have access to video capability.  We continued and completed visit with audio only.  Some vital signs may be absent or patient reported.   Tillie Rung, LPN  Cardiac Risk Factors include: advanced age (>47men, >83 women);male gender;hypertension;dyslipidemia     Objective:    Today's Vitals   07/01/22 1557  Weight: 158 lb (71.7 kg)  Height: 5\' 8"  (1.727 m)   Body mass index is 24.02 kg/m.     07/01/2022    4:04 PM 03/04/2022    1:57 PM 07/31/2021    2:04 PM 06/27/2021    3:59 PM 05/29/2021    2:00 PM 04/18/2021    2:10 PM 03/06/2021    9:01 AM  Advanced Directives  Does Patient Have a Medical Advance Directive? Yes Yes Yes No No Yes Yes  Type of Estate agent of [place];Living will Living will Living will    Living will  Does patient want to make changes to medical advance directive?    No - Patient declined     Copy of Healthcare Power of Attorney in Chart? No - copy requested        Would patient like information on creating a medical advance directive?    No - Patient declined No - Patient declined       Current Medications (verified) Outpatient Encounter Medications as of 07/01/2022  Medication Sig   ciclopirox (PENLAC) 8 % solution Apply topically at bedtime. Apply over nail and surrounding skin. Apply daily over previous coat. After seven (7) days, may remove with alcohol and continue cycle.   Cyanocobalamin (VITAMIN B12) 500 MCG TABS Take 1 tablet by mouth daily.   diltiazem (TIAZAC) 120 MG 24 hr capsule Take 1 capsule (120 mg total) by mouth every morning.   hydrochlorothiazide (MICROZIDE) 12.5 MG capsule Take 1 capsule (12.5 mg total) by mouth daily.   ketoconazole (NIZORAL) 2 % cream SMARTSIG:1 Application Topical 1 to 2 Times Daily   levETIRAcetam (KEPPRA) 500 MG tablet Take 1 tablet twice a day   losartan (COZAAR) 100 MG tablet Take 1 tablet (100 mg total) by mouth every evening.   omeprazole (PRILOSEC) 20 MG capsule TAKE 1 CAPSULE BY MOUTH TWICE A DAY   Oxymetazoline HCl (NASAL SPRAY) 0.05 % SOLN Place into the nose.   traMADol (ULTRAM) 50 MG tablet Take 50 mg by mouth every 6 (six) hours as needed.   XARELTO 20 MG TABS tablet TAKE 1 TABLET BY MOUTH DAILY WITH SUPPER   zinc gluconate 50 MG tablet Take 50 mg by mouth daily as needed.   No facility-administered encounter medications on file as of 07/01/2022.    Allergies (verified) Patient has no known allergies.  History: Past Medical History:  Diagnosis Date   Arthritis    Asthma    Cancer (HCC)    prostate   GERD (gastroesophageal reflux disease)    Guillain-Barre syndrome (HCC) 02/25/1998   Hyperlipidemia    Hypertension    Seizures (HCC)    Past Surgical History:  Procedure Laterality Date   CATARACT EXTRACTION     EYE SURGERY  02/26/1943   82 years old   HERNIA REPAIR  02/25/1993   NOSE SURGERY  02/26/1988   PROSTATE SURGERY  02/25/1998   seed inplants   SHOULDER SURGERY  02/25/2006   Family History  Problem Relation Age of Onset   Alcohol abuse Father    Hyperlipidemia Brother    Stroke Brother     Diabetes Brother        type ll   Bladder Cancer Brother    Diabetes Brother    Social History   Socioeconomic History   Marital status: Divorced    Spouse name: Not on file   Number of children: 3   Years of education: Not on file   Highest education level: Some college, no degree  Occupational History   Occupation: retired  Tobacco Use   Smoking status: Never   Smokeless tobacco: Never  Vaping Use   Vaping Use: Never used  Substance and Sexual Activity   Alcohol use: Yes    Alcohol/week: 5.0 standard drinks of alcohol    Types: 5 Standard drinks or equivalent per week    Comment: quit drinking wine, now drinks 1 liquor drink a night   Drug use: No   Sexual activity: Not Currently  Other Topics Concern   Not on file  Social History Narrative   Lives alone in one level. Has two sons 1 daughter and friends who serve as support.   Former Psychologist, prison and probation services; Enjoys Mudlogger, reading   Originally from [place]   Right handed    Caffeine 1 cup daily   Social Determinants of Health   Financial Resource Strain: Low Risk  (07/01/2022)   Overall Financial Resource Strain (CARDIA)    Difficulty of Paying Living Expenses: Not hard at all  Food Insecurity: No Food Insecurity (07/01/2022)   Hunger Vital Sign    Worried About Running Out of Food in the Last Year: Never true    Ran Out of Food in the Last Year: Never true  Transportation Needs: No Transportation Needs (07/01/2022)   PRAPARE - Administrator, Civil Service (Medical): No    Lack of Transportation (Non-Medical): No  Physical Activity: Sufficiently Active (07/01/2022)   Exercise Vital Sign    Days of Exercise per Week: 7 days    Minutes of Exercise per Session: 40 min  Stress: No Stress Concern Present (07/01/2022)   [employer] of Occupational Health - Occupational Stress Questionnaire    Feeling of Stress : Not at all  Social Connections: Moderately Integrated (07/01/2022)   Social Connection and Isolation  Panel [NHANES]    Frequency of Communication with Friends and Family: More than three times a week    Frequency of Social Gatherings with Friends and Family: More than three times a week    Attends Religious Services: More than 4 times per year    Active Member of [employer] or Organizations: Yes    Attends Engineer, structural: More than 4 times per year    Marital Status: Never married    Tobacco Counseling Counseling given: Not Answered  Clinical Intake:  Pre-visit preparation completed: No  Pain : No/denies pain     BMI - recorded: 24.02 Nutritional Status: BMI of 19-24  Normal Nutritional Risks: None Diabetes: No  How often do you need to have someone help you when you read instructions, pamphlets, or other written materials from your doctor or pharmacy?: 1 - Never  Diabetic?  No  Interpreter Needed?: No  Information entered by :: Theresa Mulligan LPN   Activities of Daily Living    07/01/2022    4:03 PM  In your present state of health, do you have any difficulty performing the following activities:  Hearing? 0  Vision? 0  Difficulty concentrating or making decisions? 0  Walking or climbing stairs? 0  Dressing or bathing? 0  Doing errands, shopping? 0  Preparing Food and eating ? N  Using the Toilet? N  In the past six months, have you accidently leaked urine? N  Do you have problems with loss of bowel control? N  Managing your Medications? N  Managing your Finances? N  Housekeeping or managing your Housekeeping? N    Patient Care Team: Kristian Covey, MD as PCP - General (Family Medicine) Rodrigo Ran, [place] (Ophthalmology) Verner Chol, [facility] (Inactive) as Pharmacist (Pharmacist) Van Clines, MD as Consulting Physician (Neurology) Juanell Fairly, RN as Case Manager Tessa Lerner, DO as Consulting Physician (Cardiology)  Indicate any recent Medical Services you may have received from other than [facility] providers in the past year (date  may be approximate).     Assessment:   This is a routine wellness examination for [place].  Hearing/Vision screen Hearing Screening - Comments:: Denies hearing difficulties   Vision Screening - Comments:: Wears rx glasses - up to date with routine eye exams with  Dr Sharlot Gowda  Dietary issues and exercise activities discussed: Exercise limited by: None identified   Goals Addressed               This Visit's Progress     No current goals (pt-stated)         Depression Screen    07/01/2022    4:01 PM 04/30/2022   10:59 AM 03/12/2022    1:39 PM 11/20/2021    2:04 PM 06/27/2021    3:55 PM 04/23/2021   12:00 PM 08/29/2020    3:43 PM  PHQ 2/9 Scores  PHQ - 2 Score 0 4 1 2  0 2 0  PHQ- 9 Score  12  10  7 1     Fall Risk    07/01/2022    4:03 PM 03/12/2022    1:48 PM 03/04/2022    1:57 PM 11/20/2021    2:03 PM 07/31/2021    2:03 PM  Fall Risk   Falls in the past year? 0 1 1 1 1   Number falls in past yr: 0 0 0 1 1  Injury with Fall? 0 1 1 1 1   Risk for fall due to : No Fall Risks History of fall(s);Impaired balance/gait;Medication side effect  No Fall Risks   Follow up Falls prevention discussed Falls prevention discussed Falls evaluation completed Falls evaluation completed     FALL RISK PREVENTION PERTAINING TO THE HOME:  Any stairs in or around the home? Yes  If so, are there any without handrails? Yes  Home free of loose throw rugs in walkways, pet beds, electrical cords, etc? Yes  Adequate lighting in your home to reduce risk of falls? Yes   ASSISTIVE DEVICES UTILIZED TO PREVENT  FALLS:  Life alert? No  Use of a cane, walker or w/c? No  Grab bars in the bathroom? No  Shower chair or bench in shower? No  Elevated toilet seat or a handicapped toilet? No   TIMED UP AND GO:  Was the test performed? No . Audio Visit   Cognitive Function:    03/20/2018   10:43 AM  MMSE - Mini Mental State Exam  Orientation to time 5  Orientation to Place 5  Registration 3  Attention/  Calculation 5  Recall 3  Language- name 2 objects 2  Language- repeat 1  Language- follow 3 step command 3  Language- read & follow direction 1  Write a sentence 1  Copy design 1  Total score 30        07/01/2022    4:04 PM 06/27/2021    4:00 PM 03/26/2019   11:49 AM  6CIT Screen  What Year? 0 points 0 points 0 points  What month? 0 points 0 points 0 points  What time? 0 points 0 points 0 points  Count back from 20 0 points 0 points 0 points  Months in reverse 0 points 0 points 0 points  Repeat phrase 0 points 0 points 0 points  Total Score 0 points 0 points 0 points    Immunizations Immunization History  Administered Date(s) Administered   Pneumococcal Polysaccharide-23 11/28/2010    TDAP status: Due, Education has been provided regarding the importance of this vaccine. Advised may receive this vaccine at local pharmacy or Health Dept. Aware to provide a copy of the vaccination record if obtained from local pharmacy or Health Dept. Verbalized acceptance and understanding.  Flu Vaccine status: Up to date  Pneumococcal vaccine status: Declined,  Education has been provided regarding the importance of this vaccine but patient still declined. Advised may receive this vaccine at local pharmacy or Health Dept. Aware to provide a copy of the vaccination record if obtained from local pharmacy or Health Dept. Verbalized acceptance and understanding.   Covid-19 vaccine status: Declined, Education has been provided regarding the importance of this vaccine but patient still declined. Advised may receive this vaccine at local pharmacy or Health Dept.or vaccine clinic. Aware to provide a copy of the vaccination record if obtained from local pharmacy or Health Dept. Verbalized acceptance and understanding.  Qualifies for Shingles Vaccine? Yes   Zostavax completed No   Shingrix Completed?: No.    Education has been provided regarding the importance of this vaccine. Patient has been advised to  call insurance company to determine out of pocket expense if they have not yet received this vaccine. Advised may also receive vaccine at local pharmacy or Health Dept. Verbalized acceptance and understanding.  Screening Tests Health Maintenance  Topic Date Due   DTaP/Tdap/Td (1 - Tdap) Never done   COVID-19 Vaccine (1) 07/17/2022 (Originally 08/28/1945)   Zoster Vaccines- Shingrix (1 of 2) 10/01/2022 (Originally 08/29/1959)   Pneumonia Vaccine 86+ Years old (2 of 2 - PCV) 07/01/2023 (Originally 11/28/2011)   INFLUENZA VACCINE  09/26/2022   Medicare Annual Wellness (AWV)  07/01/2023   HPV VACCINES  Aged Out    Health Maintenance  Health Maintenance Due  Topic Date Due   DTaP/Tdap/Td (1 - Tdap) Never done    Colorectal cancer screening: No longer required.   Lung Cancer Screening: (Low Dose CT Chest recommended if Age 64-80 years, 30 pack-year currently smoking OR have quit w/in 15years.) does not qualify.     Additional Screening:  Hepatitis C Screening:  qualify; Completed   Vision Screening: Recommended annual ophthalmology exams for early detection of glaucoma and other disorders of the eye. Is the patient up to date with their annual eye exam?  Yes  Who is the provider or what is the name of the office in which the patient attends annual eye exams? [facility] Gowda If pt is not established with a provider, would they like to be referred to a provider to establish care? No .   Dental Screening: Recommended annual dental exams for proper oral hygiene  Community Resource Referral / Chronic Care Management: CRR required this visit?  No   CCM required this visit?  No      Plan:     I have personally reviewed and noted the following in the patient's chart:   Medical and social history Use of alcohol, tobacco or illicit drugs  Current medications and supplements including opioid prescriptions. Patient is currently taking opioid prescriptions. Information provided to patient regarding  non-opioid alternatives. Patient advised to discuss non-opioid treatment plan with their provider. Functional ability and status Nutritional status Physical activity Advanced directives List of other physicians Hospitalizations, surgeries, and ER visits in previous 12 months Vitals Screenings to include cognitive, depression, and falls Referrals and appointments  In addition, I have reviewed and discussed with patient certain preventive protocols, quality metrics, and best practice recommendations. A written personalized care plan for preventive services as well as general preventive health recommendations were provided to patient.     Tillie Rung, LPN   03/02/1094   Nurse Notes: None

## 2022-07-01 NOTE — Patient Instructions (Addendum)
[name] , Thank you for taking time to come for your Medicare Wellness Visit. I appreciate your ongoing commitment to your health goals. Please review the following plan we discussed and let me know if I can assist you in the future.   These are the goals we discussed:  Goals       Exercise 3x per week (30 min per time)      Goal: CCM (Atrial Fibrillation) Expected Outcome:  Monitor, Self-Manage And Reduce Symptoms of Atrial Fibrillation      Current Barriers:  Chronic Disease Management support and education needs related to Atrial Fibrillation  Planned Interventions: Reviewed plan for management of Atrial Fibrillation.  Reviewed medications. Reviewed importance of adherence to anticoagulant exactly as prescribed. Reports taking Xarelto as prescribed. Denies concerns regarding tolerance. Agreed to keep the team updated of concerns related to prescription cost. Discussed bleeding risk associated with Xarelto and importance of self-monitoring for signs/symptoms of bleeding. Discussed importance of avoiding NSAIDs due to increased bleeding risk with anticoagulants. Discussed importance of completing laboratory monitoring as prescribed. Discussed importance of seeking immediate medical attention after a head injury or if there is blood in the urine/stool. Assessed social determinant of health barriers  Doing a lot of balance exercises and exercises for dizziness Stay hydrated Tolerating xarelto Reviewed precautions Bad dental issues. Mouth constantly bleeds Podiatriy Due for extensive work/currently on hold d/t age and other conditions Prefers Tuesday and Thursday midday from 1000-2 wont be back until mid May. Will be at beach. 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 Dr. Smitty Cords can view if he prefers before podiatry Text on cell....  will be gone after Thursday  Symptom Management: Take medications exactly as prescribed Monitor pulse (heart) rate daily Assess symptoms daily Seek medical attention if you experience a head injury or if there is blood in the urine or stool Attend appointment with the Cardiology team as scheduled Contact the clinic with new symptoms or concerns  Follow Up Plan:  Will follow up in three months          Goal: CCM (Hypertension) Expected Outcome:  Monitor, Self-Manage And Reduce Symptoms of Hypertension      Current Barriers:  Chronic Disease Management support and education needs related to Hypertension  Planned Interventions: Reviewed current treatment plan related to Hypertension, self-management, and adherence to plan as established by provider.  Reviewed medications and indications for use. Reports taking medications as prescribed. Denies concerns r/t medication management or prescription cost. Provided information regarding established blood pressure parameters along with indications for notifying a provider. Advised to monitor BP consistently and record readings.  Reviewed symptoms. Denies chest pain or palpitations. Denies headaches, dizziness, or visual changes.  Discussed compliance with recommended cardiac prudent diet. Encouraged to read nutrition labels, continue monitoring sodium intake, and avoid highly processed foods when possible.  Reviewed s/sx of heart attack, stroke and worsening symptoms that require immediate medical attention.   Last BP. 122/67 62 110/62   Slightly dizzy  BP Readings from Last 3 Encounters:  04/30/22 124/60  03/04/22 130/68  01/01/22 (!) 102/52     BP Readings from Last 3 Encounters:  03/04/22 130/68  01/01/22 (!) 102/52  01/01/22 (!) 159/67    Symptom Management: Take all medications as prescribed Attend all scheduled provider appointments Call pharmacy for medication refills 3-7 days in  advance of running out of medications Check  blood pressure  Keep a blood pressure log Call doctor for signs and symptoms of high blood pressure Read nutrition labels. Eat whole grains, fruits and vegetables, lean meats and healthy fats Limit salt intake  Call provider office for new concerns or questions   Follow Up Plan:  Will follow up in three months        No current goals (pt-stated)      Patient Stated (pt-stated)      Continue to stay healthy.      Patient Stated      Resume dancing when pandemic settles         This is a list of the screening recommended for you and due dates:  Health Maintenance  Topic Date Due   DTaP/Tdap/Td vaccine (1 - Tdap) Never done   COVID-19 Vaccine (1) 07/17/2022*   Zoster (Shingles) Vaccine (1 of 2) 10/01/2022*   Pneumonia Vaccine (2 of 2 - PCV) 07/01/2023*   Flu Shot  09/26/2022   Medicare Annual Wellness Visit  07/01/2023   HPV Vaccine  Aged Out  *Topic was postponed. The date shown is not the original due date.   Opioid Pain Medicine Management Opioids are powerful medicines that are used to treat moderate to severe pain. When used for short periods of time, they can help you to: Sleep better. Do better in physical or occupational therapy. Feel better in the first few days after an injury. Recover from surgery. Opioids should be taken with the supervision of a trained health care provider. They should be taken for the shortest period of time possible. This is because opioids can be addictive, and the longer you take opioids, the greater your risk of addiction. This addiction can also be called opioid use disorder. What are the risks? Using opioid pain medicines for longer than 3 days increases your risk of side effects. Side effects include: Constipation. Nausea and vomiting. Breathing difficulties (respiratory depression). Drowsiness. Confusion. Opioid use disorder. Itching. Taking opioid pain medicine for a long period of time can affect your ability to do daily tasks.  It also puts you at risk for: Motor vehicle crashes. Depression. Suicide. Heart attack. Overdose, which can be life-threatening. What is a pain treatment plan? A pain treatment plan is an agreement between you and your health care provider. Pain is unique to each person, and treatments vary depending on your condition. To manage your pain, you and your health care provider need to work together. To help you do this: Discuss the goals of your treatment, including how much pain you might expect to have and how you will manage the pain. Review the risks and benefits of taking opioid medicines. Remember that a good treatment plan uses more than one approach and minimizes the chance of side effects. Be honest about the amount of medicines you take and about any drug or alcohol use. Get pain medicine prescriptions from only one health care provider. Pain can be managed with many types of alternative treatments. Ask your health care provider to refer you to one or more specialists who can help you manage pain through: Physical or occupational therapy. Counseling (cognitive behavioral therapy). Good nutrition. Biofeedback. Massage. Meditation. Non-opioid medicine. Following a gentle exercise program. How to use opioid pain medicine Taking medicine Take your pain medicine exactly as told by your health care provider. Take it only when you need it. If your pain gets less severe, you may take less than your  prescribed dose if your health care provider approves. If you are not having pain, do nottake pain medicine unless your health care provider tells you to take it. If your pain is severe, do nottry to treat it yourself by taking more pills than instructed on your prescription. Contact your health care provider for help. Write down the times when you take your pain medicine. It is easy to become confused while on pain medicine. Writing the time can help you avoid overdose. Take other  over-the-counter or prescription medicines only as told by your health care provider. Keeping yourself and others safe  While you are taking opioid pain medicine: Do not drive, use machinery, or power tools. Do not sign legal documents. Do not drink alcohol. Do not take sleeping pills. Do not supervise children by yourself. Do not do activities that require climbing or being in high places. Do not go to a lake, river, ocean, spa, or swimming pool. Do not share your pain medicine with anyone. Keep pain medicine in a locked cabinet or in a secure area where pets and children cannot reach it. Stopping your use of opioids If you have been taking opioid medicine for more than a few weeks, you may need to slowly decrease (taper) how much you take until you stop completely. Tapering your use of opioids can decrease your risk of symptoms of withdrawal, such as: Pain and cramping in the abdomen. Nausea. Sweating. Sleepiness. Restlessness. Uncontrollable shaking (tremors). Cravings for the medicine. Do not attempt to taper your use of opioids on your own. Talk with your health care provider about how to do this. Your health care provider may prescribe a step-down schedule based on how much medicine you are taking and how long you have been taking it. Getting rid of leftover pills Do not save any leftover pills. Get rid of leftover pills safely by: Taking the medicine to a prescription take-back program. This is usually offered by the county or law enforcement. Bringing them to a pharmacy that has a drug disposal container. Flushing them down the toilet. Check the label or package insert of your medicine to see whether this is safe to do. Throwing them out in the trash. Check the label or package insert of your medicine to see whether this is safe to do. If it is safe to throw it out, remove the medicine from the original container, put it into a sealable bag or container, and mix it with used  coffee grounds, food scraps, dirt, or cat litter before putting it in the trash. Follow these instructions at home: Activity Do exercises as told by your health care provider. Avoid activities that make your pain worse. Return to your normal activities as told by your health care provider. Ask your health care provider what activities are safe for you. General instructions You may need to take these actions to prevent or treat constipation: Drink enough fluid to keep your urine pale yellow. Take over-the-counter or prescription medicines. Eat foods that are high in fiber, such as beans, whole grains, and fresh fruits and vegetables. Limit foods that are high in fat and processed sugars, such as fried or sweet foods. Keep all follow-up visits. This is important. Where to find support If you have been taking opioids for a long time, you may benefit from receiving support for quitting from a local support group or counselor. Ask your health care provider for a referral to these resources in your area. Where to find more information Centers  for Disease Control and Prevention (CDC): FootballExhibition.com.br [place] Food and Drug Administration (FDA): PumpkinSearch.com.ee Get help right away if: You may have taken too much of an opioid (overdosed). Common symptoms of an overdose: Your breathing is slower or more shallow than normal. You have a very slow heartbeat (pulse). You have slurred speech. You have nausea and vomiting. Your pupils become very small. You have other potential symptoms: You are very confused. You faint or feel like you will faint. You have cold, clammy skin. You have blue lips or fingernails. You have thoughts of harming yourself or harming others. These symptoms may represent a serious problem that is an emergency. Do not wait to see if the symptoms will go away. Get medical help right away. Call your local emergency services (911 in the [place] Do not drive yourself to the hospital.  If you  ever feel like you may hurt yourself or others, or have thoughts about taking your own life, get help right away. Go to your nearest emergency department or: Call your local emergency services (911 in the [place] Call the [facility] (907-126-5129 in the [place] Call a suicide crisis helpline, such as the National Suicide Prevention Lifeline at 434-043-9439 or 988 in the [place] This is open 24 hours a day in the [place] Text the Crisis Text Line at 6302277956 (in the [place] Summary Opioid medicines can help you manage moderate to severe pain for a short period of time. A pain treatment plan is an agreement between you and your health care provider. Discuss the goals of your treatment, including how much pain you might expect to have and how you will manage the pain. If you think that you or someone else may have taken too much of an opioid, get medical help right away. This information is not intended to replace advice given to you by your health care provider. Make sure you discuss any questions you have with your health care provider. Document Revised: 09/06/2020 Document Reviewed: 05/24/2020 Elsevier Patient Education  2023 [employer].  Advanced directives: Please bring a copy of your health care power of attorney and living will to the office to be added to your chart at your convenience.   Conditions/risks identified: None  Next appointment: Follow up in one year for your annual wellness visit.   Preventive Care 6 Years and Older, Male  Preventive care refers to lifestyle choices and visits with your health care provider that can promote health and wellness. What does preventive care include? A yearly physical exam. This is also called an annual well check. Dental exams once or twice a year. Routine eye exams. Ask your health care provider how often you should have your eyes checked. Personal lifestyle choices, including: Daily care of your teeth and gums. Regular  physical activity. Eating a healthy diet. Avoiding tobacco and drug use. Limiting alcohol use. Practicing safe sex. Taking low doses of aspirin every day. Taking vitamin and mineral supplements as recommended by your health care provider. What happens during an annual well check? The services and screenings done by your health care provider during your annual well check will depend on your age, overall health, lifestyle risk factors, and family history of disease. Counseling  Your health care provider may ask you questions about your: Alcohol use. Tobacco use. Drug use. Emotional well-being. Home and relationship well-being. Sexual activity. Eating habits. History of falls. Memory and ability to understand (cognition). Work and work Astronomer. Screening  You may have the following  tests or measurements: Height, weight, and BMI. Blood pressure. Lipid and cholesterol levels. These may be checked every 5 years, or more frequently if you are over 83 years old. Skin check. Lung cancer screening. You may have this screening every year starting at age 59 if you have a 30-pack-year history of smoking and currently smoke or have quit within the past 15 years. Fecal occult blood test (FOBT) of the stool. You may have this test every year starting at age 93. Flexible sigmoidoscopy or colonoscopy. You may have a sigmoidoscopy every 5 years or a colonoscopy every 10 years starting at age 71. Prostate cancer screening. Recommendations will vary depending on your family history and other risks. Hepatitis C blood test. Hepatitis B blood test. Sexually transmitted disease (STD) testing. Diabetes screening. This is done by checking your blood sugar (glucose) after you have not eaten for a while (fasting). You may have this done every 1-3 years. Abdominal aortic aneurysm (AAA) screening. You may need this if you are a current or former smoker. Osteoporosis. You may be screened starting at age 85  if you are at high risk. Talk with your health care provider about your test results, treatment options, and if necessary, the need for more tests. Vaccines  Your health care provider may recommend certain vaccines, such as: Influenza vaccine. This is recommended every year. Tetanus, diphtheria, and acellular pertussis (Tdap, Td) vaccine. You may need a Td booster every 10 years. Zoster vaccine. You may need this after age 43. Pneumococcal 13-valent conjugate (PCV13) vaccine. One dose is recommended after age 60. Pneumococcal polysaccharide (PPSV23) vaccine. One dose is recommended after age 30. Talk to your health care provider about which screenings and vaccines you need and how often you need them. This information is not intended to replace advice given to you by your health care provider. Make sure you discuss any questions you have with your health care provider. Document Released: 03/10/2015 Document Revised: 11/01/2015 Document Reviewed: 12/13/2014 Elsevier Interactive Patient Education  2017 [employer].  Fall Prevention in the Home Falls can cause injuries. They can happen to people of all ages. There are many things you can do to make your home safe and to help prevent falls. What can I do on the outside of my home? Regularly fix the edges of walkways and driveways and fix any cracks. Remove anything that might make you trip as you walk through a door, such as a raised step or threshold. Trim any bushes or trees on the path to your home. Use bright outdoor lighting. Clear any walking paths of anything that might make someone trip, such as rocks or tools. Regularly check to see if handrails are loose or broken. Make sure that both sides of any steps have handrails. Any raised decks and porches should have guardrails on the edges. Have any leaves, snow, or ice cleared regularly. Use sand or salt on walking paths during winter. Clean up any spills in your garage right away. This  includes oil or grease spills. What can I do in the bathroom? Use night lights. Install grab bars by the toilet and in the tub and shower. Do not use towel bars as grab bars. Use non-skid mats or decals in the tub or shower. If you need to sit down in the shower, use a plastic, non-slip stool. Keep the floor dry. Clean up any water that spills on the floor as soon as it happens. Remove soap buildup in the tub or shower regularly.  Attach bath mats securely with double-sided non-slip rug tape. Do not have throw rugs and other things on the floor that can make you trip. What can I do in the bedroom? Use night lights. Make sure that you have a light by your bed that is easy to reach. Do not use any sheets or blankets that are too big for your bed. They should not hang down onto the floor. Have a firm chair that has side arms. You can use this for support while you get dressed. Do not have throw rugs and other things on the floor that can make you trip. What can I do in the kitchen? Clean up any spills right away. Avoid walking on wet floors. Keep items that you use a lot in easy-to-reach places. If you need to reach something above you, use a strong step stool that has a grab bar. Keep electrical cords out of the way. Do not use floor polish or wax that makes floors slippery. If you must use wax, use non-skid floor wax. Do not have throw rugs and other things on the floor that can make you trip. What can I do with my stairs? Do not leave any items on the stairs. Make sure that there are handrails on both sides of the stairs and use them. Fix handrails that are broken or loose. Make sure that handrails are as long as the stairways. Check any carpeting to make sure that it is firmly attached to the stairs. Fix any carpet that is loose or worn. Avoid having throw rugs at the top or bottom of the stairs. If you do have throw rugs, attach them to the floor with carpet tape. Make sure that you  have a light switch at the top of the stairs and the bottom of the stairs. If you do not have them, ask someone to add them for you. What else can I do to help prevent falls? Wear shoes that: Do not have high heels. Have rubber bottoms. Are comfortable and fit you well. Are closed at the toe. Do not wear sandals. If you use a stepladder: Make sure that it is fully opened. Do not climb a closed stepladder. Make sure that both sides of the stepladder are locked into place. Ask someone to hold it for you, if possible. Clearly mark and make sure that you can see: Any grab bars or handrails. First and last steps. Where the edge of each step is. Use tools that help you move around (mobility aids) if they are needed. These include: Canes. Walkers. Scooters. Crutches. Turn on the lights when you go into a dark area. Replace any light bulbs as soon as they burn out. Set up your furniture so you have a clear path. Avoid moving your furniture around. If any of your floors are uneven, fix them. If there are any pets around you, be aware of where they are. Review your medicines with your doctor. Some medicines can make you feel dizzy. This can increase your chance of falling. Ask your doctor what other things that you can do to help prevent falls. This information is not intended to replace advice given to you by your health care provider. Make sure you discuss any questions you have with your health care provider. Document Released: 12/08/2008 Document Revised: 07/20/2015 Document Reviewed: 03/18/2014 Elsevier Interactive Patient Education  2017 [employer].

## 2022-07-02 ENCOUNTER — Encounter: Payer: Self-pay | Admitting: Cardiology

## 2022-07-02 ENCOUNTER — Ambulatory Visit: Payer: PPO | Admitting: Cardiology

## 2022-07-02 VITALS — BP 120/58 | HR 68 | Resp 17 | Ht 68.0 in | Wt 158.6 lb

## 2022-07-02 DIAGNOSIS — I10 Essential (primary) hypertension: Secondary | ICD-10-CM

## 2022-07-02 DIAGNOSIS — E782 Mixed hyperlipidemia: Secondary | ICD-10-CM

## 2022-07-02 DIAGNOSIS — Z7901 Long term (current) use of anticoagulants: Secondary | ICD-10-CM | POA: Diagnosis not present

## 2022-07-02 DIAGNOSIS — I480 Paroxysmal atrial fibrillation: Secondary | ICD-10-CM

## 2022-07-02 DIAGNOSIS — I422 Other hypertrophic cardiomyopathy: Secondary | ICD-10-CM

## 2022-07-02 DIAGNOSIS — M5136 Other intervertebral disc degeneration, lumbar region: Secondary | ICD-10-CM | POA: Diagnosis not present

## 2022-07-02 DIAGNOSIS — M9904 Segmental and somatic dysfunction of sacral region: Secondary | ICD-10-CM | POA: Diagnosis not present

## 2022-07-02 DIAGNOSIS — M9903 Segmental and somatic dysfunction of lumbar region: Secondary | ICD-10-CM | POA: Diagnosis not present

## 2022-07-02 DIAGNOSIS — M9905 Segmental and somatic dysfunction of pelvic region: Secondary | ICD-10-CM | POA: Diagnosis not present

## 2022-07-02 NOTE — Progress Notes (Unsigned)
ID:  Drew Smith, DOB January 23, 1941, MRN [id number]  PCP:  Kristian Covey, MD  Cardiologist:  Tessa Lerner, DO, [facility] (established care 03/15/2021)  Date: 07/02/22 Last Office Visit: 01/01/2022  Chief Complaint  Patient presents with   Atrial Fibrillation   Follow-up    HPI  Drew Smith is a 82 y.o. Caucasian male whose past medical history and cardiovascular risk factors include: History of syncope, conduction disorder, paroxysmal atrial fibrillation, hypertrophic cardiomyopathy (cardiac MRI), HTN, HLD, prostate cancer.  Patient is being followed by the practice given his HCM and paroxysmal atrial fibrillation.  He underwent cardiovascular testing as noted below.  Notably his extended Holter monitor had episodes of nocturnal pauses and one episode of pause with complete heart block during awakening hours and paroxysmal atrial fibrillation.  He was being considered for pacemaker implantation.  However, had episode of near syncope in March 2023 which resulted in elective hospitalization.  He was evaluated by cardiac electrophysiology inpatient and underwent cardiac MRI and was noted to have asymmetrical septal hypertrophy consistent with HCM.  Cardiac electrophysiology suspected that his episodes of pauses/transient complete heart block likely due to vagal mediated events and therefore patient did not undergo device therapy.  Since then his hypertensive medications have been titrated and is being followed by ambulatory blood pressure monitoring.  He presents today for 4-month follow-up visit and he is doing well from a cardiovascular standpoint.  No reoccurrence of near syncope or syncopal events.  He does not endorse evidence of bleeding.  Patient states that he is compliant with anticoagulation.  FUNCTIONAL STATUS: Golfing 3 times a week.    ALLERGIES: No Known Allergies  MEDICATION LIST PRIOR TO VISIT: Current Meds  Medication Sig   ciclopirox (PENLAC) 8 % solution  Apply topically at bedtime. Apply over nail and surrounding skin. Apply daily over previous coat. After seven (7) days, may remove with alcohol and continue cycle.   XARELTO 20 MG TABS tablet TAKE 1 TABLET BY MOUTH DAILY WITH SUPPER     PAST MEDICAL HISTORY: Past Medical History:  Diagnosis Date   Arthritis    Asthma    Cancer (HCC)    prostate   GERD (gastroesophageal reflux disease)    Guillain-Barre syndrome (HCC) 02/25/1998   Hyperlipidemia    Hypertension    Seizures (HCC)     PAST SURGICAL HISTORY: Past Surgical History:  Procedure Laterality Date   CATARACT EXTRACTION     EYE SURGERY  02/26/1943   82 years old   HERNIA REPAIR  02/25/1993   NOSE SURGERY  02/26/1988   PROSTATE SURGERY  02/25/1998   seed inplants   SHOULDER SURGERY  02/25/2006    FAMILY HISTORY: The patient family history includes Alcohol abuse in his father; Bladder Cancer in his brother; Diabetes in his brother and brother; Hyperlipidemia in his brother; Stroke in his brother.  SOCIAL HISTORY:  The patient  reports that he has never smoked. He has never used smokeless tobacco. He reports current alcohol use of about 5.0 standard drinks of alcohol per week. He reports that he does not use drugs.  REVIEW OF SYSTEMS: Review of Systems  Cardiovascular:  Negative for chest pain, dyspnea on exertion, leg swelling, near-syncope, orthopnea, palpitations, paroxysmal nocturnal dyspnea and syncope.  Respiratory:  Negative for shortness of breath.   Hematologic/Lymphatic: Negative for bleeding problem.   PHYSICAL EXAM:    07/02/2022   10:37 AM 07/01/2022    3:57 PM 04/30/2022   10:33 AM  Vitals  with BMI  Height 5\' 8"  5\' 8"  5\' 8"   Weight 158 lbs 10 oz 158 lbs 158 lbs 13 oz  BMI 24.12 24.03 24.15  Systolic 120  124  Diastolic 58  60  Pulse 68  69   Physical Exam  Constitutional: No distress.  Age appropriate, hemodynamically stable.   Neck: No JVD present.  Cardiovascular: Normal rate, regular rhythm, S1  normal, S2 normal, intact distal pulses and normal pulses. Exam reveals no gallop, no S3 and no S4.  No murmur heard. Pulmonary/Chest: Effort normal and breath sounds normal. No stridor. He has no wheezes. He has no rales.  Abdominal: Soft. Bowel sounds are normal. He exhibits no distension. There is no abdominal tenderness.  Musculoskeletal:        General: No edema.     Cervical back: Neck supple.  Neurological: He is alert and oriented to person, place, and time. He has intact cranial nerves (2-12).  Skin: Skin is warm and moist.   CARDIAC DATABASE: EKG: Jul 02, 2022: Sinus rhythm, 64 bpm, left axis, consider septal hypertrophy, left anterior fascicular block.  Echocardiogram: 03/27/2021: Study Quality: Technically difficult study. Normal LV systolic function with visual EF 60-65%. Left ventricle cavity is normal in size. Moderate left ventricular hypertrophy, prominent basal septum (IVSd1.67cm and LVPWd 1.23cm). Normal global wall motion. Normal diastolic filling pattern, normal LAP.  Trace aortic regurgitation. Trace tricuspid regurgitation. No evidence of pulmonary hypertension. No prior study for comparison.   Stress Testing: Exercise treadmill stress test 04/13/2021: Exercise treadmill stress test performed using Bruce protocol. Patient reached 4.6 METS, and 87% of age predicted maximum heart rate. Exercise capacity was low. No chest pain reported. Normal heart rate response with hypertensive exercise response. Peak BP 220/80 mmHg. Stress EKG shows no ischemic changes. Low risk study.  Heart Catheterization: None  Carotid artery duplex 03/27/2021: Duplex suggests stenosis in the right internal carotid artery (1-15%). Duplex suggests stenosis in the left internal carotid artery (1-15%). Mild diffuse homogeneous plaque noted in bilateral carotid arteries. Left carotid exhibits tortuosity and may be a source of bruit.  Antegrade right vertebral artery flow. Antegrade left  vertebral artery flow. Follow up studies if clinically indicated.  14 day extended Holter monitor: Dominant rhythm normal sinus. Heart rate 22-146 bpm. Avg HR 66 bpm. Atrial fibrillation burden 3% (heart rate 42-146 bpm, avg HR 89 bpm) 6 pauses greater than 3 seconds in duration (longest 7.3 seconds, underlying rhythm sinus, 2:33 AM, 03/25/2021).  On 03/29/2021, 8:20 AM, underlying rhythm sinus, with high degree AV block with 3.8 second pause. Total ventricular ectopic burden <1%. Total supraventricular ectopic burden <1%. Patient triggered events: 2. Normal sinus rhythm without any dysrhythmias.   Cardiac MRI 05/07/2021: 1. Asymmetric septal hypertrophy measuring up to 16mm in basal septum (10mm in posterior wall), meeting criteria for hypertrophic cardiomyopathy   2. Patchy late gadolinium enhancement in basal septum and lateral wall, consistent with HCM. LGE accounts for 5% of total myocardial mass   3.  Normal LV size and systolic function (EF 54%)   4.  Normal RV size and systolic function (EF 55%)  LABORATORY DATA:    Latest Ref Rng & Units 07/02/2022   12:47 PM 09/13/2021   12:31 PM 05/06/2021   12:08 AM  CBC  WBC 4.0 - 10.5 K/uL   8.3   Hemoglobin 13.0 - 17.7 g/dL 16.1  09.6  04.5   Hematocrit 37.5 - 51.0 % 38.1  41.6  40.3   Platelets 150 - 400  K/uL   242        Latest Ref Rng & Units 07/02/2022   12:47 PM 12/25/2021   11:49 AM 10/30/2021    2:01 PM  CMP  Glucose 70 - 99 mg/dL 95  161  096   BUN 8 - 27 mg/dL 21  16  17    Creatinine 0.76 - 1.27 mg/dL 0.45  4.09  8.11   Sodium 134 - 144 mmol/L 135  135  133   Potassium 3.5 - 5.2 mmol/L 4.8  4.7  4.6   Chloride 96 - 106 mmol/L 100  98  97   CO2 20 - 29 mmol/L 23  24  22    Calcium 8.6 - 10.2 mg/dL 8.9  9.2  9.2     Lipid Panel     Component Value Date/Time   CHOL 163 04/05/2019 1018   TRIG 103.0 04/05/2019 1018   HDL 47.60 04/05/2019 1018   CHOLHDL 3 04/05/2019 1018   VLDL 20.6 04/05/2019 1018   LDLCALC 95  04/05/2019 1018    No components found for: "NTPROBNP" Recent Labs    12/25/21 1148  PROBNP 149   No results for input(s): "TSH" in the last 8760 hours.   BMP Recent Labs    10/30/21 1401 12/25/21 1149 07/02/22 1247  NA 133* 135 135  K 4.6 4.7 4.8  CL 97 98 100  CO2 22 24 23   GLUCOSE 120* 109* 95  BUN 17 16 21   CREATININE 1.12 1.06 0.99  CALCIUM 9.2 9.2 8.9   Estimated Creatinine Clearance: 55.7 mL/min (by C-G formula based on SCr of 0.99 mg/dL).  HEMOGLOBIN A1C Lab Results  Component Value Date   HGBA1C 5.5 05/15/2021    IMPRESSION:    ICD-10-CM   1. Paroxysmal atrial fibrillation (HCC)  I48.0 EKG 12-Lead    Hemoglobin and hematocrit, blood    Basic metabolic panel    2. Long term (current) use of anticoagulants  Z79.01 Hemoglobin and hematocrit, blood    Basic metabolic panel    3. Mixed hyperlipidemia  E78.2     4. Hypertrophic cardiomyopathy (HCC), asymmetric septal hypertrophy  I42.2     5. Benign hypertension  I10         RECOMMENDATIONS: AMIT ZIRKEL is a 82 y.o. Caucasian male whose past medical history and cardiac risk factors include: History of syncope, conduction disorder, paroxysmal atrial fibrillation, hypertrophic cardiomyopathy (cardiac MRI), HTN, HLD, prostate cancer.  Paroxysmal atrial fibrillation (HCC) Diagnosed in January/February 2023 via Zio patch Rate control: Diltiazem Rhythm control: N/A Thromboembolic prophylaxis: Eliquis [id number] SCORE is 3 which correlates to 3.2% risk of stroke per year. EKG today illustrates sinus rhythm. No prior history of atrial fibrillation ablation or cardioversion.  Long term (current) use of anticoagulants Indication: Paroxysmal atrial fibrillation. Does not endorse evidence of bleeding. Discontinue Eliquis. Tolerating Xarelto 20 mg p.o. q. Supper well. Check hemoglobin and hematocrit and renal function. Medication profile discussed.   Reemphasized the risks, benefits, and  alternatives to anticoagulation.  Hypertrophic cardiomyopathy (HCC), asymmetric septal hypertrophy Diagnosed by cardiac MRI March 2023. No evidence of obstructive physiology on echocardiogram. Medications reconciled. No near-syncope or syncope since last office visit.  Benign hypertension Currently enrolled into principal care management for ambulatory blood pressure monitoring. Home blood pressures are well-controlled. Continue current medical therapy.  FINAL MEDICATION LIST END OF ENCOUNTER: No orders of the defined types were placed in this encounter.   Medications Discontinued During This Encounter  Medication Reason   Cyanocobalamin (  VITAMIN B12) 500 MCG TABS       Current Outpatient Medications:    ciclopirox (PENLAC) 8 % solution, Apply topically at bedtime. Apply over nail and surrounding skin. Apply daily over previous coat. After seven (7) days, may remove with alcohol and continue cycle., Disp: 6.6 mL, Rfl: 11   XARELTO 20 MG TABS tablet, TAKE 1 TABLET BY MOUTH DAILY WITH SUPPER, Disp: 90 tablet, Rfl: 0   diltiazem (TIAZAC) 120 MG 24 hr capsule, Take 1 capsule (120 mg total) by mouth every morning., Disp: 90 capsule, Rfl: 1   hydrochlorothiazide (MICROZIDE) 12.5 MG capsule, Take 1 capsule (12.5 mg total) by mouth daily., Disp: 90 capsule, Rfl: 1   ketoconazole (NIZORAL) 2 % cream, SMARTSIG:1 Application Topical 1 to 2 Times Daily, Disp: , Rfl:    levETIRAcetam (KEPPRA) 500 MG tablet, Take 1 tablet twice a day, Disp: 180 tablet, Rfl: 3   losartan (COZAAR) 100 MG tablet, Take 1 tablet (100 mg total) by mouth every evening., Disp: 90 tablet, Rfl: 1   omeprazole (PRILOSEC) 20 MG capsule, TAKE 1 CAPSULE BY MOUTH TWICE A DAY, Disp: 180 capsule, Rfl: 0   Oxymetazoline HCl (NASAL SPRAY) 0.05 % SOLN, Place into the nose., Disp: , Rfl:    traMADol (ULTRAM) 50 MG tablet, Take 50 mg by mouth every 6 (six) hours as needed., Disp: , Rfl:    zinc gluconate 50 MG tablet, Take 50 mg by  mouth daily as needed., Disp: , Rfl:   Orders Placed This Encounter  Procedures   Hemoglobin and hematocrit, blood   Basic metabolic panel   EKG 12-Lead    There are no Patient Instructions on file for this visit.   --Continue cardiac medications as reconciled in final medication list. --Return in about 6 months (around 01/02/2023) for Follow up, A. fib. Or sooner if needed. --Continue follow-up with your primary care physician regarding the management of your other chronic comorbid conditions.  Patient's questions and concerns were addressed to his satisfaction. He voices understanding of the instructions provided during this encounter.   This note was created using a voice recognition software as a result there may be grammatical errors inadvertently enclosed that do not reflect the nature of this encounter. Every attempt is made to correct such errors.  Tessa Lerner, [place], [facility]  Pager:  [phone] Office: [phone]

## 2022-07-03 LAB — BASIC METABOLIC PANEL
BUN/Creatinine Ratio: 21 (ref 10–24)
BUN: 21 mg/dL (ref 8–27)
CO2: 23 mmol/L (ref 20–29)
Calcium: 8.9 mg/dL (ref 8.6–10.2)
Chloride: 100 mmol/L (ref 96–106)
Creatinine, Ser: 0.99 mg/dL (ref 0.76–1.27)
Glucose: 95 mg/dL (ref 70–99)
Potassium: 4.8 mmol/L (ref 3.5–5.2)
Sodium: 135 mmol/L (ref 134–144)
eGFR: 76 mL/min/{1.73_m2} (ref >59)

## 2022-07-03 LAB — HEMOGLOBIN AND HEMATOCRIT, BLOOD
Hematocrit: 38.1 % (ref 37.5–51.0)
Hemoglobin: 12.8 g/dL — ABNORMAL LOW (ref 13.0–17.7)

## 2022-07-09 NOTE — Progress Notes (Signed)
LMTCB

## 2022-07-10 ENCOUNTER — Other Ambulatory Visit: Payer: Self-pay

## 2022-07-10 DIAGNOSIS — I480 Paroxysmal atrial fibrillation: Secondary | ICD-10-CM

## 2022-07-10 NOTE — Progress Notes (Signed)
Patient aware and orders placed and released

## 2022-07-29 ENCOUNTER — Telehealth: Payer: Self-pay | Admitting: Family Medicine

## 2022-07-29 NOTE — Telephone Encounter (Signed)
Pt requesting info on a good podiatrist in the area. Did not want OV

## 2022-07-29 NOTE — Telephone Encounter (Signed)
Left detailed message on Home answering machine informing patient of message below

## 2022-07-30 DIAGNOSIS — M79675 Pain in left toe(s): Secondary | ICD-10-CM | POA: Diagnosis not present

## 2022-07-30 DIAGNOSIS — M79674 Pain in right toe(s): Secondary | ICD-10-CM | POA: Diagnosis not present

## 2022-07-30 DIAGNOSIS — L600 Ingrowing nail: Secondary | ICD-10-CM | POA: Diagnosis not present

## 2022-07-30 DIAGNOSIS — B351 Tinea unguium: Secondary | ICD-10-CM | POA: Diagnosis not present

## 2022-07-30 DIAGNOSIS — M792 Neuralgia and neuritis, unspecified: Secondary | ICD-10-CM | POA: Diagnosis not present

## 2022-07-31 DIAGNOSIS — M5136 Other intervertebral disc degeneration, lumbar region: Secondary | ICD-10-CM | POA: Diagnosis not present

## 2022-07-31 DIAGNOSIS — M9905 Segmental and somatic dysfunction of pelvic region: Secondary | ICD-10-CM | POA: Diagnosis not present

## 2022-07-31 DIAGNOSIS — I10 Essential (primary) hypertension: Secondary | ICD-10-CM | POA: Diagnosis not present

## 2022-07-31 DIAGNOSIS — M9904 Segmental and somatic dysfunction of sacral region: Secondary | ICD-10-CM | POA: Diagnosis not present

## 2022-07-31 DIAGNOSIS — M9903 Segmental and somatic dysfunction of lumbar region: Secondary | ICD-10-CM | POA: Diagnosis not present

## 2022-08-28 ENCOUNTER — Telehealth: Payer: Self-pay

## 2022-08-28 NOTE — Telephone Encounter (Signed)
Can take with his cardiac medications.   However, he is on Ultram and Keppra - which he will need to talk to his prescribing providers.   Drew Ghrist [place], DO, [facility]

## 2022-08-28 NOTE — Telephone Encounter (Signed)
Patient wanting to know if it is ok to take Lamisil 250 mg that his podiatrist prescribed along with his other medications?

## 2022-09-02 ENCOUNTER — Other Ambulatory Visit: Payer: Self-pay | Admitting: Cardiology

## 2022-09-02 DIAGNOSIS — Z7901 Long term (current) use of anticoagulants: Secondary | ICD-10-CM

## 2022-09-06 ENCOUNTER — Telehealth: Payer: Self-pay | Admitting: Neurology

## 2022-09-06 NOTE — Telephone Encounter (Signed)
Patient left a VM on 7-12-24at 4:00 pm  Caller states that he has a dr that put him on a medication Lamisl and the heart Dr said he should check with Dr Karel Jarvis to make sure she thinks it is ok for him to take. The heart Dr did approve him taking it but still wanted Dr Karel Jarvis input. He would like someone to call him back

## 2022-09-09 NOTE — Telephone Encounter (Signed)
There is no interaction with his medication, should be okay. Thanks

## 2022-09-09 NOTE — Telephone Encounter (Signed)
I left detailed message on machine, okay to take per Dr.Aquino, to call if any questions.

## 2022-09-10 ENCOUNTER — Ambulatory Visit (INDEPENDENT_AMBULATORY_CARE_PROVIDER_SITE_OTHER): Payer: PPO

## 2022-09-10 DIAGNOSIS — I10 Essential (primary) hypertension: Secondary | ICD-10-CM

## 2022-09-10 DIAGNOSIS — I480 Paroxysmal atrial fibrillation: Secondary | ICD-10-CM

## 2022-09-11 ENCOUNTER — Ambulatory Visit: Payer: Self-pay

## 2022-09-11 DIAGNOSIS — I480 Paroxysmal atrial fibrillation: Secondary | ICD-10-CM

## 2022-09-17 ENCOUNTER — Ambulatory Visit: Payer: PPO | Admitting: Family Medicine

## 2022-09-17 DIAGNOSIS — L718 Other rosacea: Secondary | ICD-10-CM | POA: Diagnosis not present

## 2022-09-17 DIAGNOSIS — L820 Inflamed seborrheic keratosis: Secondary | ICD-10-CM | POA: Diagnosis not present

## 2022-09-17 DIAGNOSIS — L821 Other seborrheic keratosis: Secondary | ICD-10-CM | POA: Diagnosis not present

## 2022-09-17 DIAGNOSIS — M5136 Other intervertebral disc degeneration, lumbar region: Secondary | ICD-10-CM | POA: Diagnosis not present

## 2022-09-17 DIAGNOSIS — Z08 Encounter for follow-up examination after completed treatment for malignant neoplasm: Secondary | ICD-10-CM | POA: Diagnosis not present

## 2022-09-17 DIAGNOSIS — M9903 Segmental and somatic dysfunction of lumbar region: Secondary | ICD-10-CM | POA: Diagnosis not present

## 2022-09-17 DIAGNOSIS — D225 Melanocytic nevi of trunk: Secondary | ICD-10-CM | POA: Diagnosis not present

## 2022-09-17 DIAGNOSIS — L538 Other specified erythematous conditions: Secondary | ICD-10-CM | POA: Diagnosis not present

## 2022-09-17 DIAGNOSIS — Z85828 Personal history of other malignant neoplasm of skin: Secondary | ICD-10-CM | POA: Diagnosis not present

## 2022-09-17 DIAGNOSIS — M9904 Segmental and somatic dysfunction of sacral region: Secondary | ICD-10-CM | POA: Diagnosis not present

## 2022-09-17 DIAGNOSIS — M9905 Segmental and somatic dysfunction of pelvic region: Secondary | ICD-10-CM | POA: Diagnosis not present

## 2022-09-17 DIAGNOSIS — L218 Other seborrheic dermatitis: Secondary | ICD-10-CM | POA: Diagnosis not present

## 2022-09-25 DIAGNOSIS — I10 Essential (primary) hypertension: Secondary | ICD-10-CM

## 2022-09-25 DIAGNOSIS — I4891 Unspecified atrial fibrillation: Secondary | ICD-10-CM

## 2022-09-27 NOTE — Chronic Care Management (AMB) (Signed)
  Chronic Care Management   CCM RN Visit Note   Name: Drew Smith MRN: [id number] DOB: 05-21-1940  Subjective: Drew Smith is a 82 y.o. year old male who is a primary care patient of Burchette, Elberta Fortis, MD. The patient was referred to the Chronic Care Management team for assistance with care management needs subsequent to provider initiation of CCM services and plan of care.    Today's Visit:   Follow up regarding medications and evaluation for skin tag removal.        Goals Addressed             This Visit's Progress    Goal: CCM (Atrial Fibrillation) Expected Outcome:  Monitor, Self-Manage And Reduce Symptoms of Atrial Fibrillation       Current Barriers:  Chronic Disease Management support and education needs related to Atrial Fibrillation  Planned Interventions: Reviewed plan for management of Atrial Fibrillation.  Reviewed medications. Continues taking Xarelto as prescribed.  Reviewed bleeding risk associated with Xarelto and importance of self-monitoring for signs/symptoms of bleeding. Reviewed importance of avoiding NSAIDs due to increased bleeding risk with anticoagulants. Reviewed importance of completing laboratory monitoring as prescribed. Discussed importance of seeking immediate medical attention after a head injury or if there is blood in the urine/stool. Reports frequent oral bleeding for a few months. Reports this is due to dental health and his provider is aware. Reviewed risks r/t taking an anticoagulant and indications for notifying a provider is bleeding worsens.  Reports being evaluated by a Podiatrist r/t two infected toes on his left foot. Reports an antibiotic was ordered however he is reluctant to take it. Prefers to discuss further with his PCP. Reports multiple facial skin tags that need to be removed. Denies bleeding, open cuts or lesions. Will collaborate with PCP as requested to determine if skin tags can be removed in the clinic or if he  will require evaluation with a Dermatologist. Update: Relayed message to patient: PCP would like to complete a clinic visit to evaluate skin tags and discuss medication/antibiotics.  Clinic appointment scheduled for 09/17/22.   Symptom Management: Take medications exactly as prescribed Monitor pulse (heart) rate daily Assess symptoms daily Seek medical attention if you experience a head injury or if there is blood in the urine or stool Attend appointment with the Cardiology team as scheduled Contact the clinic with new symptoms or concerns             PLAN: Patient scheduled for visit with PCP on 09/17/22. A member of the care management team will follow up in three months.  [facility]/Chronic Care Management [phone]

## 2022-09-27 NOTE — Chronic Care Management (AMB) (Signed)
Chronic Care Management   CCM RN Visit Note   Name: Drew Smith MRN: [id number] DOB: 1941/02/14  Subjective: Drew Smith is a 82 y.o. year old male who is a primary care patient of Burchette, Elberta Fortis, MD. The patient was referred to the Chronic Care Management team for assistance with care management needs subsequent to provider initiation of CCM services and plan of care.    Today's Visit:  Engaged with patient by telephone for follow up visit.        Goals Addressed             This Visit's Progress    Goal: CCM (Atrial Fibrillation) Expected Outcome:  Monitor, Self-Manage And Reduce Symptoms of Atrial Fibrillation       Current Barriers:  Chronic Disease Management support and education needs related to Atrial Fibrillation  Planned Interventions: Reviewed plan for management of Atrial Fibrillation.  Reviewed medications. Continues taking Xarelto as prescribed.  Reviewed bleeding risk associated with Xarelto and importance of self-monitoring for signs/symptoms of bleeding. Reviewed importance of avoiding NSAIDs due to increased bleeding risk with anticoagulants. Reviewed importance of completing laboratory monitoring as prescribed. Discussed importance of seeking immediate medical attention after a head injury or if there is blood in the urine/stool. Reports frequent oral bleeding for a few months. Reports this is due to dental health and his provider is aware. Reviewed risks r/t taking an anticoagulant and indications for notifying a provider is bleeding worsens.  Reports being evaluated by a Podiatrist r/t two infected toes on his left foot. Reports an antibiotic was ordered however he is reluctant to take it. Prefers to discuss further with his PCP. Reports multiple facial skin tags that need to be removed. Denies bleeding, open cuts or lesions. Will collaborate with PCP as requested to determine if skin tags can be removed in the clinic or if he will require  evaluation with a Dermatologist.   Symptom Management: Take medications exactly as prescribed Monitor pulse (heart) rate daily Assess symptoms daily Seek medical attention if you experience a head injury or if there is blood in the urine or stool Attend appointment with the Cardiology team as scheduled Contact the clinic with new symptoms or concerns          Goal: CCM (Hypertension) Expected Outcome:  Monitor, Self-Manage And Reduce Symptoms of Hypertension       Current Barriers:  Chronic Disease Management support and education needs related to Hypertension  Planned Interventions: Reviewed current treatment plan related to Hypertension, self-management, and adherence to plan as established by provider.  Reports compliance with treatment plan. Reports taking medications as prescribed. Reports blood pressure readings have been within range. Reviewed symptoms. Denies chest pain or palpitations. Denies headaches.  Continues to experience dizziness. Reports completing recommended stretches and exercised to improve balance and decrease episodes of dizziness. Reports maintaining adequate hydration. Denies falls. Reviewed safety and fall prevention measures. Reviewed s/sx of heart attack, stroke and worsening symptoms that require immediate medical attention.     Symptom Management: Take all medications as prescribed Attend all scheduled provider appointments Call pharmacy for medication refills 3-7 days in advance of running out of medications Check blood pressure  Keep a blood pressure log Call doctor for signs and symptoms of high blood pressure Read nutrition labels. Eat whole grains, fruits and vegetables, lean meats and healthy fats Limit salt intake  Call provider office for new concerns or questions  PLAN: Will discuss plan regarding medication and skin tag removal with PCP. Will follow up within the next week.   [facility]/Chronic  Care Management [phone]

## 2022-10-01 ENCOUNTER — Encounter: Payer: Self-pay | Admitting: Neurology

## 2022-10-01 ENCOUNTER — Ambulatory Visit: Payer: PPO | Admitting: Neurology

## 2022-10-01 VITALS — BP 149/71 | HR 60 | Ht 68.0 in | Wt 158.4 lb

## 2022-10-01 DIAGNOSIS — R4020 Unspecified coma: Secondary | ICD-10-CM | POA: Diagnosis not present

## 2022-10-01 NOTE — Progress Notes (Signed)
NEUROLOGY FOLLOW UP OFFICE NOTE  Drew Smith [id number] 02-23-1941  HISTORY OF PRESENT ILLNESS: I had the pleasure of seeing Drew Smith in follow-up in the neurology clinic on 10/01/2022.  The patient was last seen 7 months ago for episodes of loss of consciousness. He is alone in the office today. Records and images were personally reviewed where available.   His 1-hour wake and sleep EEG was normal. He has had several cardiac studies and decided to hold off on brain MRI. He had a 14-day holter monitor with nocturnal pauses and one episode with pause and complete heart block along with intermittent paroxysmal atrial fibrillation. He was started on anticoagulation. He had a cardiac MRI consistent with asymmetrical septal hypertrophy, meeting criteria for HCM. Cardiac electrophysiology suspected his episodes of pauses/transient complete heart block was likely due to vagal medicated events and did not recommend pacemaker.   On his last visit, he expressed interest in reducing Levetiracetam dose to 500mg  at bedtime. He reports doing well with no seizures or episodes of loss of consciousness since January 2023, no near syncope since March 2023. He denies any staring/unresponsive episodes, gaps in time, olfactory/gustatory hallucinations. He used to have small twitches but none recently. His feet get numb a lot when sitting for a long time. He has a little back pain. He is always a little dizzy, he feels it is due to his BP medications, dizziness is not too bad when he holds medications in the morning to play golf. He has occasional headaches. No focal weakness. Sleep and mood are good. He does regular balance exercises and denies any falls.    History on Initial Assessment 03/06/2021: This is an 82 year old right-handed man with a history of hypertension, hyperlipidemia, prostate cancer, presenting for 2 episodes of loss of consciousness. He is accompanied by his son Drew Smith who helps supplement the  history today. The first event occurred on 11/16/20 while at [facility] and he was brought to [facility]. He was attending a dance festival for several days, he danced earlier that day and recalls having breakfast, went to a mid-day club party (did not drink alcohol), went back to his room to rest for a couple of hours. He recalls laying on the bed then waking up to people around him yelling, unable to understand them. Drew Smith reports he heard a noise and found his father moaning and groaning, he was not responding with eyes opening and closing "almost like having a bad dream." This lasted 5 minutes, he woke up and did not realize what was going on, it took about 10 minutes before he could answer orientation questions. No focal weakness, tongue bite or incontinence. Per notes, he was feverish, diaphoretic and EMS reported O2 sats in the 80s. Mental status improved en route to hospital. CT chest showed diffuse ground-glass in the right lung that can be seen in atypical particularly viral infections, WBC 13,  he was treated with Rocephin and Zmax and mental status returned to normal very rapidly. The next event occurred on 03/02/2021, again after a day of activity playing golf. He ate breakfast and had snacks, went home to clean up. He recalls feeling great when he lay down for a nap at 4pm, then woke up face down on the floor curled up in blankets around 6pm. He had difficulty getting up initially but managed to stand feeling woozy. His other son called and came, he felt fine but had a large abrasion on the bridge of  his nose and bit his tongue, so they decided to go to the ER. Bloodwork showed WBC 11.1, Na 133. UDS, EtOH negative. I personally reviewed head CT without contrast which did not show any acute changes. Symptoms felt concerning for seizure so he was discharged home on Levetiracetam 500mg  BID which makes him a little drowsy.  He has had brief body jerks in the past 3-5 years. He and his son deny any  staring/unresponsive episodes. He denies any olfactory/gustatory hallucinations, deja vu, rising epigastric sensation, focal numbness/tingling/weakness. He has a "little problem with balance" and does balance exercises regularly. He denies any headaches, diplopia, dysarthria/dysphagia, neck pain, bowel/bladder dysfunction. His back bothers him some. He feels his memory is pretty good for his age, he makes notes all the time. He denies missing medications, bill payments, or getting lost driving. 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 He had a normal birth and early development.  There is no history of febrile convulsions, CNS infections such as meningitis/encephalitis, significant traumatic brain injury, or family history of seizures. He drinks about 1.5 glasses of alcohol daily and recalls having a little more during the dance festival in 10/2020 and one beer after his golf game on 03/02/21. His son notes a fall 2 weeks ago after he had 4 drinks, he fell getting out of the car in the dark, no loss of consciousness. He reports high anxiety with very infrequent palpitations, no chest pain or shortness of breath.   Diagnostic Data: Head CT 02/2021 normal 1-hour wake and sleep EEG in 03/2021 normal.   PAST MEDICAL HISTORY: Past Medical History:  Diagnosis Date   Arthritis    Asthma    Cancer (HCC)    prostate   GERD (gastroesophageal reflux disease)    Guillain-Barre syndrome (HCC) 02/25/1998   Hyperlipidemia    Hypertension    Seizures (HCC)     MEDICATIONS: Current Outpatient Medications on File Prior to Visit  Medication Sig Dispense Refill   ciclopirox (PENLAC) 8 % solution Apply topically at bedtime. Apply over nail and surrounding skin. Apply daily over previous coat. After seven (7) days, may remove with alcohol and continue cycle. 6.6 mL 11   diltiazem (TIAZAC) 120 MG 24 hr capsule Take 1 capsule (120 mg total) by mouth every  morning. 90 capsule 1   ferrous sulfate 324 MG TBEC Take 324 mg by mouth daily.     hydrochlorothiazide (MICROZIDE) 12.5 MG capsule Take 1 capsule (12.5 mg total) by mouth daily. 90 capsule 1   ketoconazole (NIZORAL) 2 % cream SMARTSIG:1 Application Topical 1 to 2 Times Daily     levETIRAcetam (KEPPRA) 500 MG tablet Take 1 tablet twice a day 180 tablet 3   losartan (COZAAR) 100 MG tablet Take 1 tablet (100 mg total) by mouth every evening. 90 tablet 1   omeprazole (PRILOSEC) 20 MG capsule TAKE 1 CAPSULE BY MOUTH TWICE A DAY 180 capsule 0   Oxymetazoline HCl (NASAL SPRAY) 0.05 % SOLN Place into the nose.     traMADol (ULTRAM) 50 MG tablet Take 50 mg by mouth every 6 (six) hours as needed.     XARELTO 20 MG TABS tablet TAKE 1 TABLET BY MOUTH DAILY WITH SUPPER 90 tablet 0   zinc gluconate 50 MG tablet Take 50 mg by mouth daily as needed.     No current facility-administered medications on file prior to visit.    ALLERGIES: No Known Allergies  FAMILY HISTORY:  Family History  Problem Relation Age of Onset   Alcohol abuse Father    Hyperlipidemia Brother    Stroke Brother    Diabetes Brother        type ll   Bladder Cancer Brother    Diabetes Brother     SOCIAL HISTORY: Social History   Socioeconomic History   Marital status: Divorced    Spouse name: Not on file   Number of children: 3   Years of education: Not on file   Highest education level: Some college, no degree  Occupational History   Occupation: retired  Tobacco Use   Smoking status: Never   Smokeless tobacco: Never  Vaping Use   Vaping status: Never Used  Substance and Sexual Activity   Alcohol use: Yes    Alcohol/week: 5.0 standard drinks of alcohol    Types: 5 Standard drinks or equivalent per week    Comment: quit drinking wine, now drinks 1 liquor drink a night   Drug use: No   Sexual activity: Not Currently  Other Topics Concern   Not on file  Social History Narrative   Lives alone in one level. Has  two sons 1 daughter and friends who serve as support.   Former Psychologist, prison and probation services; Enjoys Mudlogger, reading   Originally from [place]   Right handed    Caffeine 1 cup daily   Social Determinants of Health   Financial Resource Strain: Low Risk  (07/01/2022)   Overall Financial Resource Strain (CARDIA)    Difficulty of Paying Living Expenses: Not hard at all  Food Insecurity: No Food Insecurity (07/01/2022)   Hunger Vital Sign    Worried About Running Out of Food in the Last Year: Never true    Ran Out of Food in the Last Year: Never true  Transportation Needs: No Transportation Needs (07/01/2022)   PRAPARE - Administrator, Civil Service (Medical): No    Lack of Transportation (Non-Medical): No  Physical Activity: Sufficiently Active (07/01/2022)   Exercise Vital Sign    Days of Exercise per Week: 7 days    Minutes of Exercise per Session: 40 min  Stress: No Stress Concern Present (07/01/2022)   [employer] of Occupational Health - Occupational Stress Questionnaire    Feeling of Stress : Not at all  Social Connections: Moderately Integrated (07/01/2022)   Social Connection and Isolation Panel [NHANES]    Frequency of Communication with Friends and Family: More than three times a week    Frequency of Social Gatherings with Friends and Family: More than three times a week    Attends Religious Services: More than 4 times per year    Active Member of [employer] or Organizations: Yes    Attends Engineer, structural: More than 4 times per year    Marital Status: Never married  Intimate Partner Violence: Not At Risk (07/01/2022)   Humiliation, Afraid, Rape, and Kick questionnaire    Fear of Current or Ex-Partner: No    Emotionally Abused: No    Physically Abused: No    Sexually Abused: No     PHYSICAL EXAM: Vitals:   10/01/22 1339  BP: (!) 149/71  Pulse: 60  SpO2: 100%   General: No acute distress Head:  Normocephalic/atraumatic Skin/Extremities: No rash, no  edema Neurological Exam: alert and awake. No aphasia or dysarthria. Fund of knowledge is appropriate. Attention and concentration are normal.   Cranial nerves: Pupils equal, round. Extraocular movements intact with no nystagmus. Visual fields  full.  No facial asymmetry.  Motor: Bulk and tone normal, muscle strength 5/5 throughout with no pronator drift.   Finger to nose testing intact.  Gait slightly wide-based, slow and cautious, no ataxia.    IMPRESSION: This is a pleasant 82 yo RH man with a history of hypertension, hyperlipidemia, prostate cancer, with 2 episodes of loss of consciousness that occurred in 10/2020 and 03/02/2021. Both occurred while taking a nap after physical activity. He was started on Levetiracetam in the ER after the second event. His 1-hour EEG is normal. He has had a cardiac workup, with nocturnal pauses and one episode with pause and complete heart block along with intermittent paroxysmal atrial fibrillation, currently on Xarelto. Cardiac electrophysiology suspected his episodes of pauses/transient complete heart block was likely due to vagal medicated events and did not recommend pacemaker. He has not had any further syncopal episodes since 02/2021, no near syncope since 04/2021. We agreed to continue low dose Levetiracetam 500mg  at bedtime for seizure prophylaxis. We discussed avoidance of seizure triggers, including sleep deprivation, missing medication, alcohol. He is aware of [place] driving laws to stop driving after a seizure until 6 months seizure-free. Follow-up in 6 months, call for any changes.    Thank you for allowing me to participate in his care.  Please do not hesitate to call for any questions or concerns.    Patrcia Dolly, M.D.   CC: Dr. Caryl Never

## 2022-10-01 NOTE — Patient Instructions (Signed)
Good to see you doing well. Continue Levetiracetam 500mg : take 1 tablet every night. Follow-up in 6 months, call for any changes.    Seizure Precautions: 1. If medication has been prescribed for you to prevent seizures, take it exactly as directed.  Do not stop taking the medicine without talking to your doctor first, even if you have not had a seizure in a long time.   2. Avoid activities in which a seizure would cause danger to yourself or to others.  Don't operate dangerous machinery, swim alone, or climb in high or dangerous places, such as on ladders, roofs, or girders.  Do not drive unless your doctor says you may.  3. If you have any warning that you may have a seizure, lay down in a safe place where you can't hurt yourself.    4.  No driving for 6 months from last seizure, as per [facility].   Please refer to the following link on the Epile[employer] of [employer]'s website for more information: http://www.epilepsyfoundation.org/answerplace/Social/driving/drivingu.cfm   5.  Maintain good sleep hygiene. Avoid alcohol.  6.  Contact your doctor if you have any problems that may be related to the medicine you are taking.  7.  Call 911 and bring the patient back to the ED if:        A.  The seizure lasts longer than 5 minutes.       B.  The patient doesn't awaken shortly after the seizure  C.  The patient has new problems such as difficulty seeing, speaking or moving  D.  The patient was injured during the seizure  E.  The patient has a temperature over 102 F (39C)  F.  The patient vomited and now is having trouble breathing

## 2022-10-02 NOTE — Progress Notes (Signed)
Pharmacy Quality Measure Review  This patient is appearing on a report for being at risk of failing the adherence measure for hypertension (ACEi/ARB) medications this calendar year.   Medication: losartan 100 mg Last fill date: 09/11/22 for 90 day supply  Insurance report was not up to date. No action needed at this time.   Nils Pyle, PharmD PGY1 Pharmacy Resident

## 2022-10-11 DIAGNOSIS — X58XXXA Exposure to other specified factors, initial encounter: Secondary | ICD-10-CM | POA: Diagnosis not present

## 2022-10-11 DIAGNOSIS — S61217A Laceration without foreign body of left little finger without damage to nail, initial encounter: Secondary | ICD-10-CM | POA: Diagnosis not present

## 2022-10-11 DIAGNOSIS — Z23 Encounter for immunization: Secondary | ICD-10-CM | POA: Diagnosis not present

## 2022-10-24 DIAGNOSIS — B351 Tinea unguium: Secondary | ICD-10-CM | POA: Diagnosis not present

## 2022-10-24 DIAGNOSIS — Z4802 Encounter for removal of sutures: Secondary | ICD-10-CM | POA: Diagnosis not present

## 2022-11-04 ENCOUNTER — Other Ambulatory Visit: Payer: Self-pay | Admitting: Family Medicine

## 2022-11-04 DIAGNOSIS — M79675 Pain in left toe(s): Secondary | ICD-10-CM | POA: Diagnosis not present

## 2022-11-04 DIAGNOSIS — B351 Tinea unguium: Secondary | ICD-10-CM | POA: Diagnosis not present

## 2022-11-04 DIAGNOSIS — L600 Ingrowing nail: Secondary | ICD-10-CM | POA: Diagnosis not present

## 2022-11-04 DIAGNOSIS — M79674 Pain in right toe(s): Secondary | ICD-10-CM | POA: Diagnosis not present

## 2022-11-05 NOTE — Telephone Encounter (Signed)
Pt is aware rx can take up to 3 business days. Pt is going out of town

## 2022-12-04 ENCOUNTER — Other Ambulatory Visit: Payer: Self-pay | Admitting: Cardiology

## 2022-12-04 DIAGNOSIS — I422 Other hypertrophic cardiomyopathy: Secondary | ICD-10-CM

## 2022-12-04 DIAGNOSIS — Z7901 Long term (current) use of anticoagulants: Secondary | ICD-10-CM

## 2022-12-04 DIAGNOSIS — I480 Paroxysmal atrial fibrillation: Secondary | ICD-10-CM

## 2022-12-04 DIAGNOSIS — I10 Essential (primary) hypertension: Secondary | ICD-10-CM

## 2022-12-05 NOTE — Telephone Encounter (Signed)
Prescription refill request for Xarelto received.  Indication: PAF Last office visit: 07/02/22  Emelda Brothers MD Weight: 71.9kg Age: 82 Scr: 0.99 on 07/02/22  Epic CrCl: 57.50  Based on above findings Xarelto 20mg  daily is the appropriate dose.  Refill approved.

## 2022-12-24 DIAGNOSIS — I480 Paroxysmal atrial fibrillation: Secondary | ICD-10-CM | POA: Diagnosis not present

## 2022-12-25 LAB — BASIC METABOLIC PANEL
BUN/Creatinine Ratio: 19 (ref 10–24)
BUN: 20 mg/dL (ref 8–27)
CO2: 23 mmol/L (ref 20–29)
Calcium: 9 mg/dL (ref 8.6–10.2)
Chloride: 102 mmol/L (ref 96–106)
Creatinine, Ser: 1.05 mg/dL (ref 0.76–1.27)
Glucose: 117 mg/dL — ABNORMAL HIGH (ref 70–99)
Potassium: 4.7 mmol/L (ref 3.5–5.2)
Sodium: 137 mmol/L (ref 134–144)
eGFR: 70 mL/min/{1.73_m2} (ref >59)

## 2022-12-25 LAB — HEMOGLOBIN AND HEMATOCRIT, BLOOD
Hematocrit: 40.8 % (ref 37.5–51.0)
Hemoglobin: 13.6 g/dL (ref 13.0–17.7)

## 2022-12-31 ENCOUNTER — Encounter: Payer: Self-pay | Admitting: Cardiology

## 2022-12-31 ENCOUNTER — Ambulatory Visit: Payer: PPO | Attending: Cardiology | Admitting: Cardiology

## 2022-12-31 VITALS — BP 120/76 | HR 62 | Resp 16 | Ht 68.0 in | Wt 159.6 lb

## 2022-12-31 DIAGNOSIS — Z7901 Long term (current) use of anticoagulants: Secondary | ICD-10-CM

## 2022-12-31 DIAGNOSIS — I422 Other hypertrophic cardiomyopathy: Secondary | ICD-10-CM | POA: Diagnosis not present

## 2022-12-31 DIAGNOSIS — I10 Essential (primary) hypertension: Secondary | ICD-10-CM

## 2022-12-31 DIAGNOSIS — I480 Paroxysmal atrial fibrillation: Secondary | ICD-10-CM

## 2022-12-31 NOTE — Progress Notes (Signed)
Cardiology Office Note:  .   Date:  12/31/2022  ID:  Drew Smith, DOB [birth year], MRN [id number] PCP:  Drew Covey, MD  Former Cardiology Providers: N/A [facility] Providers Cardiologist:  Drew Lerner, DO , [facility] (established care 03/15/2021) Electrophysiologist:  None  Click to update primary MD,subspecialty MD or APP then REFRESH:1}    Chief Complaint  Patient presents with   Paroxysmal atrial fibrillation    Follow-up    History of Present Illness: .   Drew Smith is a 82 y.o. Caucasian male whose past medical history and cardiovascular risk factors includes: History of syncope, conduction disorder, paroxysmal atrial fibrillation, hypertrophic cardiomyopathy (cardiac MRI), HTN, HLD, prostate cancer.   Patient is being followed by the practice given his HCM and paroxysmal atrial fibrillation.   He underwent cardiovascular testing as noted below.  Notably his extended Holter monitor had episodes of nocturnal pauses and one episode of pause with complete heart block during awakening hours and paroxysmal atrial fibrillation.  He was being considered for pacemaker implantation.  However, had episode of near syncope in March 2023 which resulted in elective hospitalization.  He was evaluated by cardiac electrophysiology inpatient and underwent cardiac MRI and was noted to have asymmetrical septal hypertrophy consistent with HCM.  Cardiac electrophysiology suspected that his episodes of pauses/transient complete heart block likely due to vagal mediated events and therefore patient did not undergo device therapy.  Over the last 6 months patient denies any anginal chest pain or heart failure symptoms.  No reoccurrence of near-syncope or syncopal events.  At times when he plays golf for prolonged period of time he feels a bit dizziness but it is short-lived. Patient does not endorse any evidence of falls and his pinky finger which bled for prolonged period of time but  eventually stopped.   Review of Systems: .   Review of Systems  Cardiovascular:  Negative for chest pain, dyspnea on exertion, leg swelling, near-syncope, orthopnea, palpitations, paroxysmal nocturnal dyspnea and syncope.  Respiratory:  Negative for shortness of breath.   Hematologic/Lymphatic: Negative for bleeding problem.    Studies Reviewed:   EKG: EKG Interpretation Date/Time:  Tuesday December 31 2022 11:38:39 EST Ventricular Rate:  62 PR Interval:  234 QRS Duration:  90 QT Interval:  398 QTC Calculation: 403 R Axis:   -20  Text Interpretation: Sinus rhythm with 1st degree A-V block Moderate voltage criteria for LVH, may be normal variant ( R in aVL , Cornell product ) Cannot rule out Inferior infarct , age undetermined Anterolateral infarct , age undetermined When compared with ECG of 04-May-2021 20:07, No significant change since last tracing Confirmed by Drew Smith [phone]) on 12/31/2022 12:02:42 PM  Echocardiogram: 03/27/2021: Study Quality: Technically difficult study. Normal LV systolic function with visual EF 60-65%. Left ventricle cavity is normal in size. Moderate left ventricular hypertrophy, prominent basal septum (IVSd1.67cm and LVPWd 1.23cm). Normal global wall motion. Normal diastolic filling pattern, normal LAP.  Trace aortic regurgitation. Trace tricuspid regurgitation. No evidence of pulmonary hypertension. No prior study for comparison.   Stress Testing: Exercise treadmill stress test 04/13/2021: Exercise treadmill stress test performed using Bruce protocol. Patient reached 4.6 METS, and 87% of age predicted maximum heart rate. Exercise capacity was low. No chest pain reported. Normal heart rate response with hypertensive exercise response. Peak BP 220/80 mmHg. Stress EKG shows no ischemic changes. Low risk study.   Heart Catheterization: None   Carotid artery duplex 03/27/2021: Duplex suggests stenosis in the right internal  carotid artery  (1-15%). Duplex suggests stenosis in the left internal carotid artery (1-15%). Mild diffuse homogeneous plaque noted in bilateral carotid arteries. Left carotid exhibits tortuosity and may be a source of bruit.  Antegrade right vertebral artery flow. Antegrade left vertebral artery flow. Follow up studies if clinically indicated.   14 day extended Holter monitor: Dominant rhythm normal sinus. Heart rate 22-146 bpm. Avg HR 66 bpm. Atrial fibrillation burden 3% (heart rate 42-146 bpm, avg HR 89 bpm) 6 pauses greater than 3 seconds in duration (longest 7.3 seconds, underlying rhythm sinus, 2:33 AM, 03/25/2021).  On 03/29/2021, 8:20 AM, underlying rhythm sinus, with high degree AV block with 3.8 second pause. Total ventricular ectopic burden <1%. Total supraventricular ectopic burden <1%. Patient triggered events: 2. Normal sinus rhythm without any dysrhythmias.    Cardiac MRI 05/07/2021: 1. Asymmetric septal hypertrophy measuring up to 16mm in basal septum (10mm in posterior wall), meeting criteria for hypertrophic cardiomyopathy   2. Patchy late gadolinium enhancement in basal septum and lateral wall, consistent with HCM. LGE accounts for 5% of total myocardial mass   3.  Normal LV size and systolic function (EF 54%)   4.  Normal RV size and systolic function (EF 55%)  Risk Assessment/Calculations:   Click Here to Calculate/Change CHADS2VASc Score The patient's CHADS2-VASc score is 3, indicating a 3.2% annual risk of stroke. CHF History: No HTN History: Yes Diabetes History: No Stroke History: No Vascular Disease History: No  Labs:       Latest Ref Rng & Units 12/24/2022   11:25 AM 07/02/2022   12:47 PM 09/13/2021   12:31 PM  CBC  Hemoglobin 13.0 - 17.7 g/dL 11.9  14.7  82.9   Hematocrit 37.5 - 51.0 % 40.8  38.1  41.6        Latest Ref Rng & Units 12/24/2022   11:25 AM 07/02/2022   12:47 PM 12/25/2021   11:49 AM  BMP  Glucose 70 - 99 mg/dL 562  95  130   BUN 8 - 27 mg/dL  20  21  16    Creatinine 0.76 - 1.27 mg/dL 8.65  7.84  6.96   BUN/Creat Ratio 10 - 24 19  21  15    Sodium 134 - 144 mmol/L 137  135  135   Potassium 3.5 - 5.2 mmol/L 4.7  4.8  4.7   Chloride 96 - 106 mmol/L 102  100  98   CO2 20 - 29 mmol/L 23  23  24    Calcium 8.6 - 10.2 mg/dL 9.0  8.9  9.2       Latest Ref Rng & Units 12/24/2022   11:25 AM 07/02/2022   12:47 PM 12/25/2021   11:49 AM  CMP  Glucose 70 - 99 mg/dL 295  95  284   BUN 8 - 27 mg/dL 20  21  16    Creatinine 0.76 - 1.27 mg/dL 1.32  4.40  1.02   Sodium 134 - 144 mmol/L 137  135  135   Potassium 3.5 - 5.2 mmol/L 4.7  4.8  4.7   Chloride 96 - 106 mmol/L 102  100  98   CO2 20 - 29 mmol/L 23  23  24    Calcium 8.6 - 10.2 mg/dL 9.0  8.9  9.2     Lab Results  Component Value Date   CHOL 163 04/05/2019   HDL 47.60 04/05/2019   LDLCALC 95 04/05/2019   TRIG 103.0 04/05/2019   CHOLHDL 3 04/05/2019  No results for input(s): "LIPOA" in the last 8760 hours. No components found for: "NTPROBNP" No results for input(s): "PROBNP" in the last 8760 hours. No results for input(s): "TSH" in the last 8760 hours.   Physical Exam:    Today's Vitals   12/31/22 1134  BP: 120/76  Pulse: 62  Resp: 16  SpO2: 95%  Weight: 159 lb 9.6 oz (72.4 kg)  Height: 5\' 8"  (1.727 m)   Body mass index is 24.27 kg/m. Wt Readings from Last 3 Encounters:  12/31/22 159 lb 9.6 oz (72.4 kg)  10/01/22 158 lb 6.4 oz (71.8 kg)  07/02/22 158 lb 9.6 oz (71.9 kg)    Physical Exam  Constitutional: No distress.  Age appropriate, hemodynamically stable.   Neck: No JVD present.  Cardiovascular: Normal rate, regular rhythm, S1 normal, S2 normal, intact distal pulses and normal pulses. Exam reveals no gallop, no S3 and no S4.  No murmur heard. Pulmonary/Chest: Effort normal and breath sounds normal. No stridor. He has no wheezes. He has no rales.  Abdominal: Soft. Bowel sounds are normal. He exhibits no distension. There is no abdominal tenderness.   Musculoskeletal:        General: No edema.     Cervical back: Neck supple.  Neurological: He is alert and oriented to person, place, and time. He has intact cranial nerves (2-12).  Skin: Skin is warm and moist.     Impression & Recommendation(s):  Impression:   ICD-10-CM   1. Paroxysmal atrial fibrillation (HCC)  I48.0 EKG 12-Lead    2. Long term (current) use of anticoagulants  Z79.01 Hemoglobin and hematocrit, blood    Hemoglobin and hematocrit, blood    3. Hypertrophic cardiomyopathy (HCC), asymmetric septal hypertrophy  I42.2     4. Benign hypertension  I10 Basic metabolic panel    Basic metabolic panel       Recommendation(s):  Paroxysmal atrial fibrillation (HCC) Long term (current) use of anticoagulants Diagnosed in January/February 2023 via cardiac monitor. Rate control: Diltiazem. Rhythm control: N/A. Thromboembolic prophylaxis: Eliquis. Labs from 11/2022 independently reviewed-renal function and hemoglobin at baseline. No recurrence of falls. Risks, benefits, alternatives to anticoagulation discussed with the patient at today's office visit. Estimated Creatinine Clearance: 51.6 mL/min (by C-G formula based on SCr of 1.05 mg/dL). Will recheck BMP and H&H prior to next office visit  Hypertrophic cardiomyopathy [facility]), asymmetric septal hypertrophy Diagnosed based on cardiac MRI March 2023. No evidence of obstructive physiology on echocardiogram. Medications reconciled-continue diltiazem 120 mg p.o. daily. He denies palpitations near-syncope or syncopal events. Monitor for now  Benign hypertension Office blood pressures are very well-controlled. Continue hydrochlorothiazide 12.5 mg p.o. daily. Continue losartan 100 mg p.o. daily. Patient is advised to hold blood pressure medications on days when he feels lightheaded and dizzy to avoid episodes of near syncope/syncope.  Orders Placed:  Orders Placed This Encounter  Procedures   Hemoglobin and hematocrit,  blood    Standing Status:   Future    Number of Occurrences:   1    Standing Expiration Date:   12/31/2023   Basic metabolic panel    Standing Status:   Future    Number of Occurrences:   1    Standing Expiration Date:   12/31/2023   EKG 12-Lead    As part of medical decision making labs dated December 24, 2022, EKG, prior echo and cardiac MRI results were reviewed during today's encounter  Final Medication List:   No orders of the defined types were placed  in this encounter.   There are no discontinued medications.   Current Outpatient Medications:    ciclopirox (PENLAC) 8 % solution, Apply topically at bedtime. Apply over nail and surrounding skin. Apply daily over previous coat. After seven (7) days, may remove with alcohol and continue cycle., Disp: 6.6 mL, Rfl: 11   diltiazem (TIAZAC) 120 MG 24 hr capsule, TAKE 1 CAPSULE BY MOUTH EVERY MORNING, Disp: 90 capsule, Rfl: 1   ferrous sulfate 324 MG TBEC, Take 324 mg by mouth daily., Disp: , Rfl:    hydrochlorothiazide (MICROZIDE) 12.5 MG capsule, Take 1 capsule (12.5 mg total) by mouth daily., Disp: 90 capsule, Rfl: 1   ketoconazole (NIZORAL) 2 % cream, SMARTSIG:1 Application Topical 1 to 2 Times Daily, Disp: , Rfl:    levETIRAcetam (KEPPRA) 500 MG tablet, Take 1 tablet twice a day, Disp: 180 tablet, Rfl: 3   losartan (COZAAR) 100 MG tablet, TAKE ONE TABLET BY MOUTH EVERY EVENING, Disp: 90 tablet, Rfl: 1   omeprazole (PRILOSEC) 20 MG capsule, TAKE 1 CAPSULE BY MOUTH TWICE A DAY, Disp: 60 capsule, Rfl: 0   Oxymetazoline HCl (NASAL SPRAY) 0.05 % SOLN, Place into the nose., Disp: , Rfl:    rivaroxaban (XARELTO) 20 MG TABS tablet, TAKE 1 TABLET BY MOUTH DAILY WITH SUPPER, Disp: 90 tablet, Rfl: 1   traMADol (ULTRAM) 50 MG tablet, Take 50 mg by mouth every 6 (six) hours as needed., Disp: , Rfl:    zinc gluconate 50 MG tablet, Take 50 mg by mouth daily as needed., Disp: , Rfl:   Consent:   N/A  Disposition:   6 months sooner if  needed. Patient may be asked to follow-up sooner based on the results of the above-mentioned testing.  His questions and concerns were addressed to his satisfaction. He voices understanding of the recommendations provided during this encounter.    Signed, Drew Lerner, DO, [facility]  [facility]  [address] [place], [place] [address] 12/31/2022 12:22 PM

## 2022-12-31 NOTE — Patient Instructions (Addendum)
Medication Instructions:  Your physician recommends that you continue on your current medications as directed. Please refer to the Current Medication list given to you today.  *If you need a refill on your cardiac medications before your next appointment, please call your pharmacy*  Lab Work: BMP and Hemoglobin/hematocrit to be completed prior to 6 month follow-up with Dr. Odis Hollingshead   If you have labs (blood work) drawn today and your tests are completely normal, you will receive your results only by: MyChart Message (if you have MyChart) OR A paper copy in the mail If you have any lab test that is abnormal or we need to change your treatment, we will call you to review the results.  Testing/Procedures: None ordered today.  Follow-Up: At [facility], you and your health needs are our priority.  As part of our continuing mission to provide you with exceptional heart care, we have created designated Provider Care Teams.  These Care Teams include your primary Cardiologist (physician) and Advanced Practice Providers (APPs -  Physician Assistants and Nurse Practitioners) who all work together to provide you with the care you need, when you need it.  We recommend signing up for the patient portal called "MyChart".  Sign up information is provided on this After Visit Summary.  MyChart is used to connect with patients for Virtual Visits (Telemedicine).  Patients are able to view lab/test results, encounter notes, upcoming appointments, etc.  Non-urgent messages can be sent to your provider as well.   To learn more about what you can do with MyChart, go to [email or web address].    Your next appointment:   6 month(s)  The format for your next appointment:   In Person  Provider:   Tessa Lerner, DO {

## 2023-01-06 DIAGNOSIS — M50322 Other cervical disc degeneration at C5-C6 level: Secondary | ICD-10-CM | POA: Diagnosis not present

## 2023-01-06 DIAGNOSIS — M9901 Segmental and somatic dysfunction of cervical region: Secondary | ICD-10-CM | POA: Diagnosis not present

## 2023-01-13 DIAGNOSIS — M50322 Other cervical disc degeneration at C5-C6 level: Secondary | ICD-10-CM | POA: Diagnosis not present

## 2023-01-13 DIAGNOSIS — M9901 Segmental and somatic dysfunction of cervical region: Secondary | ICD-10-CM | POA: Diagnosis not present

## 2023-01-15 DIAGNOSIS — M9901 Segmental and somatic dysfunction of cervical region: Secondary | ICD-10-CM | POA: Diagnosis not present

## 2023-01-15 DIAGNOSIS — M50322 Other cervical disc degeneration at C5-C6 level: Secondary | ICD-10-CM | POA: Diagnosis not present

## 2023-01-27 ENCOUNTER — Telehealth: Payer: Self-pay

## 2023-01-27 ENCOUNTER — Other Ambulatory Visit: Payer: Self-pay

## 2023-01-27 NOTE — Patient Outreach (Signed)
Care Management   Visit Note  01/27/2023 Name: AEDEN HASSINGER MRN: [id number] DOB: 1940-08-13  Subjective: NIGUEL HARGAN is a 82 y.o. year old male who is a primary care patient of Burchette, Elberta Fortis, MD. The Care Management team was consulted for assistance.      Engaged with Mr. Ngo via telephone.  Assessment:  Outpatient Encounter Medications as of 01/27/2023  Medication Sig Note   ciclopirox (PENLAC) 8 % solution Apply topically at bedtime. Apply over nail and surrounding skin. Apply daily over previous coat. After seven (7) days, may remove with alcohol and continue cycle.    diltiazem (TIAZAC) 120 MG 24 hr capsule TAKE 1 CAPSULE BY MOUTH EVERY MORNING    ferrous sulfate 324 MG TBEC Take 324 mg by mouth daily.    hydrochlorothiazide (MICROZIDE) 12.5 MG capsule Take 1 capsule (12.5 mg total) by mouth daily.    ketoconazole (NIZORAL) 2 % cream SMARTSIG:1 Application Topical 1 to 2 Times Daily    levETIRAcetam (KEPPRA) 500 MG tablet Take 1 tablet twice a day 03/12/2022: Reports currently taking once a day. (Trial for 6 months)   losartan (COZAAR) 100 MG tablet TAKE ONE TABLET BY MOUTH EVERY EVENING    omeprazole (PRILOSEC) 20 MG capsule TAKE 1 CAPSULE BY MOUTH TWICE A DAY    Oxymetazoline HCl (NASAL SPRAY) 0.05 % SOLN Place into the nose.    rivaroxaban (XARELTO) 20 MG TABS tablet TAKE 1 TABLET BY MOUTH DAILY WITH SUPPER    traMADol (ULTRAM) 50 MG tablet Take 50 mg by mouth every 6 (six) hours as needed.    zinc gluconate 50 MG tablet Take 50 mg by mouth daily as needed.    No facility-administered encounter medications on file as of 01/27/2023.    Interventions:   Goals Addressed             This Visit's Progress    Care Management       Current Barriers:  Care Management support and education needs related to Hypertension and Atrial Fibrillation.  Planned Interventions: Reviewed current treatment plans, self-management, and adherence to plan as established by  provider.  Reviewed medications. Reports taking as prescribed. Denies urgent concerns related to medication management. He is interested in speaking with the CM Pharmacist again to discussed options for reducing prescription cost. Message relayed to assigned pharmacist Delano Metz. Reports compliance with treatment plan. Reports blood pressure readings have been within range. Reviewed symptoms. Continues to experience episodes of dizziness. Reports following recommended exercises to improve balance and reduce episodes. Reports occasionally playing golf without complications. Denies chest pain or palpitations. Denies headaches.  Reviewed bleeding risk associated with Xarelto and importance of self-monitoring for signs/symptoms of bleeding. Reviewed importance of avoiding NSAIDs due to increased bleeding risk with anticoagulants. Reviewed importance of completing laboratory monitoring as prescribed. Discussed importance of seeking immediate medical attention after a head injury or if there is blood in the urine/stool. Denies falls. Reviewed safety and fall prevention measures. Reviewed s/sx of heart attack, stroke and worsening symptoms that require immediate medical attention.  BP Readings from Last 3 Encounters:  12/31/22 120/76  10/01/22 (!) 149/71  07/02/22 (!) 120/58       Symptom Management: Take all medications as prescribed Attend all scheduled provider appointments Call pharmacy for medication refills 3-7 days in advance of running out of medications Check blood pressure  Keep a blood pressure log Call doctor for signs and symptoms of high blood pressure Assess symptoms daily Seek medical attention  if you experience a head injury or if there is blood in the urine or stool Read nutrition labels. Eat whole grains, fruits and vegetables, lean meats and healthy fats Limit salt intake  Call provider office for new concerns or questions             PLAN Mr. Santalucia will  contact the care team if additional assistance is required.   Katina [facility]  [facility], [facility] RN Care Manager Direct Dial: [phone] Website: [email or web address]

## 2023-01-27 NOTE — Patient Outreach (Signed)
  Care Management   Outreach Note  01/27/2023 Name: Drew Smith MRN: [id number] DOB: 1941/01/19  An unsuccessful telephone outreach was attempted today to contact the patient about Care Management needs.    Follow Up Plan:  A HIPAA compliant phone message was left for the patient providing contact information and requesting a return call.    Katina [facility]  [facility], [facility] RN Care Manager Direct Dial: [phone] Website: [email or web address]

## 2023-02-04 DIAGNOSIS — M9901 Segmental and somatic dysfunction of cervical region: Secondary | ICD-10-CM | POA: Diagnosis not present

## 2023-02-04 DIAGNOSIS — M50322 Other cervical disc degeneration at C5-C6 level: Secondary | ICD-10-CM | POA: Diagnosis not present

## 2023-02-04 DIAGNOSIS — B351 Tinea unguium: Secondary | ICD-10-CM | POA: Diagnosis not present

## 2023-02-04 DIAGNOSIS — M79674 Pain in right toe(s): Secondary | ICD-10-CM | POA: Diagnosis not present

## 2023-02-04 DIAGNOSIS — M79675 Pain in left toe(s): Secondary | ICD-10-CM | POA: Diagnosis not present

## 2023-02-04 DIAGNOSIS — L600 Ingrowing nail: Secondary | ICD-10-CM | POA: Diagnosis not present

## 2023-02-15 ENCOUNTER — Other Ambulatory Visit: Payer: Self-pay | Admitting: Family Medicine

## 2023-02-15 ENCOUNTER — Other Ambulatory Visit: Payer: Self-pay | Admitting: Cardiology

## 2023-02-15 DIAGNOSIS — I10 Essential (primary) hypertension: Secondary | ICD-10-CM

## 2023-02-24 ENCOUNTER — Telehealth: Payer: Self-pay

## 2023-02-24 MED ORDER — OMEPRAZOLE 20 MG PO CPDR: 20.0000 mg | DELAYED_RELEASE_CAPSULE | Freq: Two times a day (BID) | ORAL | 0 refills | Status: DC

## 2023-02-24 NOTE — Telephone Encounter (Signed)
Copied from CRM [id number]. Topic: Clinical - Prescription Issue >> Feb 20, 2023  4:03 PM Adele Barthel wrote: Reason for CRM: Pt was attempting to refill his omeprazole 20 mg and was notified by pharmacy the refill was refused. Requests a call back, #[phone] (H)

## 2023-02-24 NOTE — Telephone Encounter (Signed)
Patient aware he needs appt and this has been scheduled and 30 day supply sent

## 2023-02-28 ENCOUNTER — Ambulatory Visit: Payer: PPO | Admitting: Family Medicine

## 2023-03-19 ENCOUNTER — Other Ambulatory Visit: Payer: Self-pay | Admitting: Family Medicine

## 2023-03-20 DIAGNOSIS — D492 Neoplasm of unspecified behavior of bone, soft tissue, and skin: Secondary | ICD-10-CM | POA: Diagnosis not present

## 2023-03-20 DIAGNOSIS — L570 Actinic keratosis: Secondary | ICD-10-CM | POA: Diagnosis not present

## 2023-03-20 DIAGNOSIS — Z85820 Personal history of malignant melanoma of skin: Secondary | ICD-10-CM | POA: Diagnosis not present

## 2023-03-20 DIAGNOSIS — D225 Melanocytic nevi of trunk: Secondary | ICD-10-CM | POA: Diagnosis not present

## 2023-03-20 DIAGNOSIS — L814 Other melanin hyperpigmentation: Secondary | ICD-10-CM | POA: Diagnosis not present

## 2023-03-20 DIAGNOSIS — L821 Other seborrheic keratosis: Secondary | ICD-10-CM | POA: Diagnosis not present

## 2023-03-20 DIAGNOSIS — Z85828 Personal history of other malignant neoplasm of skin: Secondary | ICD-10-CM | POA: Diagnosis not present

## 2023-03-20 DIAGNOSIS — Z08 Encounter for follow-up examination after completed treatment for malignant neoplasm: Secondary | ICD-10-CM | POA: Diagnosis not present

## 2023-03-20 DIAGNOSIS — L905 Scar conditions and fibrosis of skin: Secondary | ICD-10-CM | POA: Diagnosis not present

## 2023-03-21 DIAGNOSIS — D101 Benign neoplasm of tongue: Secondary | ICD-10-CM | POA: Diagnosis not present

## 2023-03-21 DIAGNOSIS — D225 Melanocytic nevi of trunk: Secondary | ICD-10-CM | POA: Diagnosis not present

## 2023-03-21 DIAGNOSIS — L821 Other seborrheic keratosis: Secondary | ICD-10-CM | POA: Diagnosis not present

## 2023-04-01 ENCOUNTER — Ambulatory Visit (INDEPENDENT_AMBULATORY_CARE_PROVIDER_SITE_OTHER): Payer: PPO | Admitting: Family Medicine

## 2023-04-01 ENCOUNTER — Encounter: Payer: Self-pay | Admitting: Family Medicine

## 2023-04-01 VITALS — BP 120/74 | HR 62 | Temp 98.1°F | Ht 68.0 in | Wt 166.7 lb

## 2023-04-01 DIAGNOSIS — I480 Paroxysmal atrial fibrillation: Secondary | ICD-10-CM

## 2023-04-01 DIAGNOSIS — R229 Localized swelling, mass and lump, unspecified: Secondary | ICD-10-CM

## 2023-04-01 DIAGNOSIS — Z87898 Personal history of other specified conditions: Secondary | ICD-10-CM | POA: Diagnosis not present

## 2023-04-01 DIAGNOSIS — I10 Essential (primary) hypertension: Secondary | ICD-10-CM

## 2023-04-01 DIAGNOSIS — I422 Other hypertrophic cardiomyopathy: Secondary | ICD-10-CM

## 2023-04-01 NOTE — Patient Instructions (Signed)
Do follow up for dermatologist for left temporal skin lesion- might be basal cell carcinoma.

## 2023-04-01 NOTE — Progress Notes (Signed)
 Established Patient Office Visit  Subjective   Patient ID: Drew Smith, male    DOB: 1941/01/02  Age: 83 y.o. MRN: [id number]  Chief Complaint  Patient presents with   Medical Management of Chronic Issues    Patient states he is here for a annual     HPI   Drew Smith is seen for medical follow-up.  He has history of complete heart block, hypertension, atrial fibrillation, asymmetric septal hypertrophy of the basal septum, history of multiple skin cancers, dyslipidemia, history of Guillain-Barr syndrome, history of melanoma, history of prostate cancer, history of seizures.  Denies any recent seizure activity.  Has some nonspecific fatigue intermittently but generally doing well.  Still golfs usually couple times per week.  He remains on diltiazem  120 mg daily and also takes Xarelto .  He states his memory is not as good and he sometimes struggles with medications.  We have strongly suggested pillbox to help him keep this in line.  Recently went to his dermatologist.  Had several areas biopsied and request we look at those today.  He noticed a small nodule left temporal region which she thinks they missed during recent exam.  He remains on Keppra  for seizure disorder.  No recent seizures.  Couple years ago had cardiac MRI which showed asymmetric septal hypertrophy of 16 mm basal septum.  LGE 5% of total myocardial mass.  EF 54%.  He is generally fairly inactive.  Sometimes has some mild dizziness with exertion but generally low levels of exertion with his day-to-day activities.  Denies any recent syncope.  No recent chest pains.  Past Medical History:  Diagnosis Date   Arthritis    Asthma    Cancer (HCC)    prostate   GERD (gastroesophageal reflux disease)    Guillain-Barre syndrome (HCC) 02/25/1998   Hyperlipidemia    Hypertension    Seizures (HCC)    Past Surgical History:  Procedure Laterality Date   CATARACT EXTRACTION     EYE SURGERY  02/26/1943   83 years old    HERNIA REPAIR  02/25/1993   NOSE SURGERY  02/26/1988   PROSTATE SURGERY  02/25/1998   seed inplants   SHOULDER SURGERY  02/25/2006    reports that he has never smoked. He has never used smokeless tobacco. He reports current alcohol use of about 5.0 standard drinks of alcohol per week. He reports that he does not use drugs. family history includes Alcohol abuse in his father; Bladder Cancer in his brother; Diabetes in his brother and brother; Hyperlipidemia in his brother; Stroke in his brother. No Known Allergies  Review of Systems  Constitutional:  Negative for chills and fever.  Respiratory:  Negative for cough and shortness of breath.   Cardiovascular:  Negative for chest pain, palpitations, leg swelling and PND.      Objective:     BP 120/74 (BP Location: Left Arm, Patient Position: Sitting, Cuff Size: Large)   Pulse 62   Temp 98.1 F (36.7 C) (Oral)   Ht 5' 8 (1.727 m)   Wt 166 lb 11.2 oz (75.6 kg)   SpO2 97%   BMI 25.35 kg/m  BP Readings from Last 3 Encounters:  04/01/23 120/74  12/31/22 120/76  10/01/22 (!) 149/71   Wt Readings from Last 3 Encounters:  04/01/23 166 lb 11.2 oz (75.6 kg)  12/31/22 159 lb 9.6 oz (72.4 kg)  10/01/22 158 lb 6.4 oz (71.8 kg)      Physical Exam Vitals reviewed.  Constitutional:  General: He is not in acute distress.    Appearance: He is well-developed. He is not ill-appearing.  HENT:     Right Ear: External ear normal.     Left Ear: External ear normal.  Eyes:     Pupils: Pupils are equal, round, and reactive to light.  Neck:     Thyroid : No thyromegaly.  Cardiovascular:     Rate and Rhythm: Normal rate and regular rhythm.  Pulmonary:     Effort: Pulmonary effort is normal. No respiratory distress.     Breath sounds: Normal breath sounds. No wheezing or rales.  Musculoskeletal:     Cervical back: Neck supple.  Skin:    Comments: Small approximately 6 mm slightly raised nodule with pearly border left temporal region  with slightly umbilicated center.  Neurological:     Mental Status: He is alert and oriented to person, place, and time.      No results found for any visits on 04/01/23.  Last CBC Lab Results  Component Value Date   WBC 8.3 05/06/2021   HGB 13.6 12/24/2022   HCT 40.8 12/24/2022   MCV 91.8 05/06/2021   MCH 31.2 05/06/2021   RDW 12.5 05/06/2021   PLT 242 05/06/2021   Last metabolic panel Lab Results  Component Value Date   GLUCOSE 117 (H) 12/24/2022   NA 137 12/24/2022   K 4.7 12/24/2022   CL 102 12/24/2022   CO2 23 12/24/2022   BUN 20 12/24/2022   CREATININE 1.05 12/24/2022   EGFR 70 12/24/2022   CALCIUM 9.0 12/24/2022   PROT 6.6 05/04/2021   ALBUMIN 3.6 05/04/2021   BILITOT 0.5 05/04/2021   ALKPHOS 57 05/04/2021   AST 19 05/04/2021   ALT 14 05/04/2021   ANIONGAP 7 05/06/2021   Last lipids Lab Results  Component Value Date   CHOL 163 04/05/2019   HDL 47.60 04/05/2019   LDLCALC 95 04/05/2019   TRIG 103.0 04/05/2019   CHOLHDL 3 04/05/2019   Last thyroid  functions Lab Results  Component Value Date   TSH 1.63 08/29/2020      The ASCVD Risk score (Arnett DK, et al., 2019) failed to calculate for the following reasons:   The 2019 ASCVD risk score is only valid for ages 70 to 23    Assessment & Plan:   #1 history of multiple skin cancers.  Recently saw dermatologist.  Has what appears to be probable small basal cell carcinoma left temporal region.  We did recommend he bring this to the attention of his dermatologist next visit and will likely need biopsy  #2 history of seizures currently stable on Keppra  which she takes only once daily.  No recent seizure activity.  #3 history of atrial fibrillation treated with diltiazem  and Xarelto .  #4 hypertrophic cardiomyopathy.  History of asymmetric septal hypertrophy 16 mm basal septum but patient relatively asymptomatic although he is fairly sedentary currently.  5% LGE.  Continue diltiazem .  Continue close  cardiology follow-up  #5 hypertension currently stable on HCTZ, losartan , and diltiazem .  Wolm Scarlet, MD

## 2023-04-15 ENCOUNTER — Encounter: Payer: Self-pay | Admitting: Neurology

## 2023-04-15 ENCOUNTER — Ambulatory Visit: Payer: PPO | Admitting: Neurology

## 2023-04-15 VITALS — BP 116/57 | HR 58 | Ht 68.0 in | Wt 167.0 lb

## 2023-04-15 DIAGNOSIS — R4020 Unspecified coma: Secondary | ICD-10-CM

## 2023-04-15 MED ORDER — LEVETIRACETAM 500 MG PO TABS: ORAL_TABLET | ORAL | 3 refills | Status: DC

## 2023-04-15 NOTE — Patient Instructions (Signed)
Always a pleasure to see you. Continue Levetiracetam (Keppra) 500mg  every night. Follow-up in 6 months, call for any changes.    Seizure Precautions: 1. If medication has been prescribed for you to prevent seizures, take it exactly as directed.  Do not stop taking the medicine without talking to your doctor first, even if you have not had a seizure in a long time.   2. Avoid activities in which a seizure would cause danger to yourself or to others.  Don't operate dangerous machinery, swim alone, or climb in high or dangerous places, such as on ladders, roofs, or girders.  Do not drive unless your doctor says you may.  3. If you have any warning that you may have a seizure, lay down in a safe place where you can't hurt yourself.    4.  No driving for 6 months from last seizure, as per [facility].   Please refer to the following link on the Epilepsy Foundation of America's website for more information: http://www.epilepsyfoundation.org/answerplace/Social/driving/drivingu.cfm   5.  Maintain good sleep hygiene. Avoid alcohol.  6.  Contact your doctor if you have any problems that may be related to the medicine you are taking.  7.  Call 911 and bring the patient back to the ED if:        A.  The seizure lasts longer than 5 minutes.       B.  The patient doesn't awaken shortly after the seizure  C.  The patient has new problems such as difficulty seeing, speaking or moving  D.  The patient was injured during the seizure  E.  The patient has a temperature over 102 F (39C)  F.  The patient vomited and now is having trouble breathing

## 2023-04-15 NOTE — Progress Notes (Unsigned)
NEUROLOGY FOLLOW UP OFFICE NOTE  Drew Smith [id number] [birth year]  HISTORY OF PRESENT ILLNESS: I had the pleasure of seeing Drew Smith in follow-up in the neurology clinic on 04/15/2023.  The patient was last seen 6 months ago for episodes of loss of consciousness. He is alone in the office today. His 1-hour wake and sleep EEG was normal. He has had several cardiac studies and decided to hold off on brain MRI. He had a 14-day holter monitor with nocturnal pauses and one episode with pause and complete heart block along with intermittent paroxysmal atrial fibrillation. He was started on anticoagulation. He had a cardiac MRI consistent with asymmetrical septal hypertrophy, meeting criteria for HCM. Cardiac electrophysiology suspected his episodes of pauses/transient complete heart block was likely due to vagal medicated events and did not recommend pacemaker.   Since his last visit, he denies any loss of consciousness since January 2023. He is on a low dose of Levetiracetam 500mg  at bedtime without side effects. He denies any staring/unresponsive episodes, gaps in time, olfactory/gustatory hallucinations, focal numbness/tingling/weakness, myoclonic jerks. No headaches, no falls. He is sensitive to bright lights and wears polarized glasses indoors. He has little episodes of near syncope here and there. He had a longer period of dizziness last Sunday. He feels his BP medication makes him dizzy as well. He tries to get 8 hours of sleep. He has noticed he may have weird dreams if he takes Keppra too late at night. He reports one incident when he got very upset with his son and was shaky for a period of time, no loss of awareness. He continues to be active playing golf and dancing, doing balance exercises daily.    History on Initial Assessment 03/06/2021: This is an 83 year old right-handed man with a history of hypertension, hyperlipidemia, prostate cancer, presenting for 2 episodes of loss of  consciousness. He is accompanied by his son Drew Smith who helps supplement the history today. The first event occurred on 11/16/20 while at [facility] and he was brought to [facility]. He was attending a dance festival for several days, he danced earlier that day and recalls having breakfast, went to a mid-day club party (did not drink alcohol), went back to his room to rest for a couple of hours. He recalls laying on the bed then waking up to people around him yelling, unable to understand them. Drew Smith reports he heard a noise and found his father moaning and groaning, he was not responding with eyes opening and closing "almost like having a bad dream." This lasted 5 minutes, he woke up and did not realize what was going on, it took about 10 minutes before he could answer orientation questions. No focal weakness, tongue bite or incontinence. Per notes, he was feverish, diaphoretic and EMS reported O2 sats in the 80s. Mental status improved en route to hospital. CT chest showed diffuse ground-glass in the right lung that can be seen in atypical particularly viral infections, WBC 13,  he was treated with Rocephin and Zmax and mental status returned to normal very rapidly. The next event occurred on 03/02/2021, again after a day of activity playing golf. He ate breakfast and had snacks, went home to clean up. He recalls feeling great when he lay down for a nap at 4pm, then woke up face down on the floor curled up in blankets around 6pm. He had difficulty getting up initially but managed to stand feeling woozy. His other son called and came, he  felt fine but had a large abrasion on the bridge of his nose and bit his tongue, so they decided to go to the ER. Bloodwork showed WBC 11.1, Na 133. UDS, EtOH negative. I personally reviewed head CT without contrast which did not show any acute changes. Symptoms felt concerning for seizure so he was discharged home on Levetiracetam 500mg  BID which makes him a little  drowsy.  He has had brief body jerks in the past 3-5 years. He and his son deny any staring/unresponsive episodes. He denies any olfactory/gustatory hallucinations, deja vu, rising epigastric sensation, focal numbness/tingling/weakness. He has a "little problem with balance" and does balance exercises regularly. He denies any headaches, diplopia, dysarthria/dysphagia, neck pain, bowel/bladder dysfunction. His back bothers him some. He feels his memory is pretty good for his age, he makes notes all the time. He denies missing medications, bill payments, or getting lost driving. 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 He had a normal birth and early development.  There is no history of febrile convulsions, CNS infections such as meningitis/encephalitis, significant traumatic brain injury, or family history of seizures. He drinks about 1.5 glasses of alcohol daily and recalls having a little more during the dance festival in 10/2020 and one beer after his golf game on 03/02/21. His son notes a fall 2 weeks ago after he had 4 drinks, he fell getting out of the car in the dark, no loss of consciousness. He reports high anxiety with very infrequent palpitations, no chest pain or shortness of breath.   Diagnostic Data: Head CT 02/2021 normal 1-hour wake and sleep EEG in 03/2021 normal.   PAST MEDICAL HISTORY: Past Medical History:  Diagnosis Date   Arthritis    Asthma    Cancer (HCC)    prostate   GERD (gastroesophageal reflux disease)    Guillain-Barre syndrome (HCC) 02/25/1998   Hyperlipidemia    Hypertension    Seizures (HCC)     MEDICATIONS: Current Outpatient Medications on File Prior to Visit  Medication Sig Dispense Refill   ciclopirox (PENLAC) 8 % solution Apply topically at bedtime. Apply over nail and surrounding skin. Apply daily over previous coat. After seven (7) days, may remove with alcohol and continue cycle. 6.6 mL 11    diltiazem (TIAZAC) 120 MG 24 hr capsule TAKE 1 CAPSULE BY MOUTH EVERY MORNING 90 capsule 1   ferrous sulfate 324 MG TBEC Take 324 mg by mouth daily.     hydrochlorothiazide (MICROZIDE) 12.5 MG capsule TAKE 1 CAPSULE BY MOUTH DAILY 90 capsule 2   ketoconazole (NIZORAL) 2 % cream SMARTSIG:1 Application Topical 1 to 2 Times Daily     levETIRAcetam (KEPPRA) 500 MG tablet Take 1 tablet twice a day 180 tablet 3   losartan (COZAAR) 100 MG tablet TAKE ONE TABLET BY MOUTH EVERY EVENING 90 tablet 1   omeprazole (PRILOSEC) 20 MG capsule Take 1 capsule (20 mg total) by mouth 2 (two) times daily. 60 capsule 0   Oxymetazoline HCl (NASAL SPRAY) 0.05 % SOLN Place into the nose.     rivaroxaban (XARELTO) 20 MG TABS tablet TAKE 1 TABLET BY MOUTH DAILY WITH SUPPER 90 tablet 1   traMADol (ULTRAM) 50 MG tablet Take 50 mg by mouth every 6 (six) hours as needed.     zinc gluconate 50 MG tablet Take 50 mg by mouth daily as needed.     No current facility-administered medications on file prior to visit.  ALLERGIES: No Known Allergies  FAMILY HISTORY: Family History  Problem Relation Age of Onset   Alcohol abuse Father    Hyperlipidemia Brother    Stroke Brother    Diabetes Brother        type ll   Bladder Cancer Brother    Diabetes Brother     SOCIAL HISTORY: Social History   Socioeconomic History   Marital status: Divorced    Spouse name: Not on file   Number of children: 3   Years of education: Not on file   Highest education level: Some college, no degree  Occupational History   Occupation: retired  Tobacco Use   Smoking status: Never   Smokeless tobacco: Never  Vaping Use   Vaping status: Never Used  Substance and Sexual Activity   Alcohol use: Yes    Alcohol/week: 5.0 standard drinks of alcohol    Types: 5 Standard drinks or equivalent per week    Comment: quit drinking wine, now drinks 1 liquor drink a night   Drug use: No   Sexual activity: Not Currently  Other Topics Concern    Not on file  Social History Narrative   Lives alone in one level. Has two sons 1 daughter and friends who serve as support.   Former Psychologist, prison and probation services; Enjoys Mudlogger, reading   Originally from [place]   Right handed    Caffeine 1 cup daily   Social Drivers of [employer] Strain: Low Risk  (07/01/2022)   Overall Financial Resource Strain (CARDIA)    Difficulty of Paying Living Expenses: Not hard at all  Food Insecurity: No Food Insecurity (07/01/2022)   Hunger Vital Sign    Worried About Running Out of Food in the Last Year: Never true    Ran Out of Food in the Last Year: Never true  Transportation Needs: No Transportation Needs (07/01/2022)   PRAPARE - Administrator, Civil Service (Medical): No    Lack of Transportation (Non-Medical): No  Physical Activity: Sufficiently Active (07/01/2022)   Exercise Vital Sign    Days of Exercise per Week: 7 days    Minutes of Exercise per Session: 40 min  Stress: No Stress Concern Present (07/01/2022)   [employer] of Occupational Health - Occupational Stress Questionnaire    Feeling of Stress : Not at all  Social Connections: Moderately Integrated (07/01/2022)   Social Connection and Isolation Panel [NHANES]    Frequency of Communication with Friends and Family: More than three times a week    Frequency of Social Gatherings with Friends and Family: More than three times a week    Attends Religious Services: More than 4 times per year    Active Member of [employer] or Organizations: Yes    Attends Engineer, structural: More than 4 times per year    Marital Status: Never married  Intimate Partner Violence: Not At Risk (07/01/2022)   Humiliation, Afraid, Rape, and Kick questionnaire    Fear of Current or Ex-Partner: No    Emotionally Abused: No    Physically Abused: No    Sexually Abused: No     PHYSICAL EXAM: Vitals:   04/15/23 1426  BP: (!) 116/57  Pulse: (!) 58  SpO2: 96%   General: No acute  distress Head:  Normocephalic/atraumatic Skin/Extremities: No rash, no edema Neurological Exam: alert and oriented to person, place, and time. No aphasia or dysarthria. Fund of knowledge is appropriate.  Recent and remote memory are intact.  Attention and concentration are normal.   Cranial nerves: Pupils equal, round. Extraocular movements intact with no nystagmus. Visual fields full.  No facial asymmetry.  Motor: Bulk and tone normal, muscle strength 5/5 throughout with no pronator drift.   Finger to nose testing intact.  Gait narrow-based and steady, able to tandem walk adequately.  Romberg negative.   IMPRESSION: This is a pleasant 83 yo RH man with a history of hypertension, hyperlipidemia, prostate cancer, with 2 episodes of loss of consciousness that occurred in 10/2020 and 03/02/2021. Both occurred while taking a nap after physical activity. He was started on Levetiracetam in the ER after the second event. His 1-hour EEG is normal. He has had a cardiac workup, with nocturnal pauses and one episode with pause and complete heart block along with intermittent paroxysmal atrial fibrillation, currently on Xarelto. Cardiac electrophysiology suspected his episodes of pauses/transient complete heart block was likely due to vagal medicated events and did not recommend pacemaker. He has not had any further syncopal episodes since 02/2021, no near syncope since 04/2021. We agreed to continue low dose Levetiracetam 500mg  at bedtime for seizure prophylaxis. We discussed avoidance of seizure triggers, including sleep deprivation, missing medication, alcohol. He is aware of [employer] driving laws to stop driving after a seizure until 6 months seizure-free. Follow-up in 6 months, call for any changes.      Thank you for allowing me to participate in *** care.  Please do not hesitate to call for any questions or concerns.  The duration of this appointment visit was *** minutes of face-to-face time with the patient.   Greater than 50% of this time was spent in counseling, explanation of diagnosis, planning of further management, and coordination of care.   Patrcia Dolly, M.D.   CC: ***

## 2023-04-28 DIAGNOSIS — M5136 Other intervertebral disc degeneration, lumbar region with discogenic back pain only: Secondary | ICD-10-CM | POA: Diagnosis not present

## 2023-04-28 DIAGNOSIS — M9904 Segmental and somatic dysfunction of sacral region: Secondary | ICD-10-CM | POA: Diagnosis not present

## 2023-04-28 DIAGNOSIS — M9903 Segmental and somatic dysfunction of lumbar region: Secondary | ICD-10-CM | POA: Diagnosis not present

## 2023-04-28 DIAGNOSIS — M9905 Segmental and somatic dysfunction of pelvic region: Secondary | ICD-10-CM | POA: Diagnosis not present

## 2023-04-30 DIAGNOSIS — M9904 Segmental and somatic dysfunction of sacral region: Secondary | ICD-10-CM | POA: Diagnosis not present

## 2023-04-30 DIAGNOSIS — M9903 Segmental and somatic dysfunction of lumbar region: Secondary | ICD-10-CM | POA: Diagnosis not present

## 2023-04-30 DIAGNOSIS — M5136 Other intervertebral disc degeneration, lumbar region with discogenic back pain only: Secondary | ICD-10-CM | POA: Diagnosis not present

## 2023-04-30 DIAGNOSIS — M9905 Segmental and somatic dysfunction of pelvic region: Secondary | ICD-10-CM | POA: Diagnosis not present

## 2023-05-02 ENCOUNTER — Ambulatory Visit: Admitting: Adult Health

## 2023-05-02 VITALS — BP 110/60 | HR 61 | Temp 98.1°F | Ht 68.0 in | Wt 166.0 lb

## 2023-05-02 DIAGNOSIS — D492 Neoplasm of unspecified behavior of bone, soft tissue, and skin: Secondary | ICD-10-CM | POA: Diagnosis not present

## 2023-05-02 DIAGNOSIS — L821 Other seborrheic keratosis: Secondary | ICD-10-CM

## 2023-05-02 NOTE — Progress Notes (Signed)
 Subjective:    Patient ID: Drew Smith, male    DOB: 1940/04/23, 83 y.o.   MRN: [id number]  HPI 83 year old male who  has a past medical history of Arthritis, Asthma, Cancer (HCC), GERD (gastroesophageal reflux disease), Guillain-Barre syndrome (HCC) (02/25/1998), Hyperlipidemia, Hypertension, and Seizures (HCC).  He presents to the office today for chronic skin concerns.   He reports having a spot on his right upper face that scabs over and falls off and then comes back.  This can be tender at times.  He also has a spot on his left upper face that is similar in nature.  He does see dermatology on a regular basis and has an appointment in the next 2 months.  He also has some seborrheic keratoses that he would like burned off because they itch him on his shoulders and back.    Review of Systems See HPI   Past Medical History:  Diagnosis Date   Arthritis    Asthma    Cancer (HCC)    prostate   GERD (gastroesophageal reflux disease)    Guillain-Barre syndrome (HCC) 02/25/1998   Hyperlipidemia    Hypertension    Seizures (HCC)     Social History   Socioeconomic History   Marital status: Divorced    Spouse name: Not on file   Number of children: 3   Years of education: Not on file   Highest education level: Some college, no degree  Occupational History   Occupation: retired  Tobacco Use   Smoking status: Never   Smokeless tobacco: Never  Vaping Use   Vaping status: Never Used  Substance and Sexual Activity   Alcohol use: Yes    Alcohol/week: 5.0 standard drinks of alcohol    Types: 5 Standard drinks or equivalent per week    Comment: quit drinking wine, now drinks 1 liquor drink a night   Drug use: No   Sexual activity: Not Currently  Other Topics Concern   Not on file  Social History Narrative   Lives alone in one level. Has two sons 1 daughter and friends who serve as support.   Former Psychologist, prison and probation services; Enjoys Mudlogger, reading   Originally from [place]    Right handed    Caffeine 1 cup daily   Social Drivers of [employer] Strain: Low Risk  (07/01/2022)   Overall Financial Resource Strain (CARDIA)    Difficulty of Paying Living Expenses: Not hard at all  Food Insecurity: No Food Insecurity (07/01/2022)   Hunger Vital Sign    Worried About Running Out of Food in the Last Year: Never true    Ran Out of Food in the Last Year: Never true  Transportation Needs: No Transportation Needs (07/01/2022)   PRAPARE - Administrator, Civil Service (Medical): No    Lack of Transportation (Non-Medical): No  Physical Activity: Sufficiently Active (07/01/2022)   Exercise Vital Sign    Days of Exercise per Week: 7 days    Minutes of Exercise per Session: 40 min  Stress: No Stress Concern Present (07/01/2022)   [employer] of Occupational Health - Occupational Stress Questionnaire    Feeling of Stress : Not at all  Social Connections: Moderately Integrated (07/01/2022)   Social Connection and Isolation Panel [NHANES]    Frequency of Communication with Friends and Family: More than three times a week    Frequency of Social Gatherings with Friends and Family: More than three times a  week    Attends Religious Services: More than 4 times per year    Active Member of Clubs or Organizations: Yes    Attends Banker Meetings: More than 4 times per year    Marital Status: Never married  Intimate Partner Violence: Not At Risk (07/01/2022)   Humiliation, Afraid, Rape, and Kick questionnaire    Fear of Current or Ex-Partner: No    Emotionally Abused: No    Physically Abused: No    Sexually Abused: No    Past Surgical History:  Procedure Laterality Date   CATARACT EXTRACTION     EYE SURGERY  02/26/1943   83 years old   HERNIA REPAIR  02/25/1993   NOSE SURGERY  02/26/1988   PROSTATE SURGERY  02/25/1998   seed inplants   SHOULDER SURGERY  02/25/2006    Family History  Problem Relation Age of Onset   Alcohol abuse  Father    Hyperlipidemia Brother    Stroke Brother    Diabetes Brother        type ll   Bladder Cancer Brother    Diabetes Brother     No Known Allergies  Current Outpatient Medications on File Prior to Visit  Medication Sig Dispense Refill   ciclopirox (PENLAC) 8 % solution Apply topically at bedtime. Apply over nail and surrounding skin. Apply daily over previous coat. After seven (7) days, may remove with alcohol and continue cycle. 6.6 mL 11   diltiazem (TIAZAC) 120 MG 24 hr capsule TAKE 1 CAPSULE BY MOUTH EVERY MORNING 90 capsule 1   ferrous sulfate 324 MG TBEC Take 324 mg by mouth daily.     hydrochlorothiazide (MICROZIDE) 12.5 MG capsule TAKE 1 CAPSULE BY MOUTH DAILY 90 capsule 2   ketoconazole (NIZORAL) 2 % cream SMARTSIG:1 Application Topical 1 to 2 Times Daily     levETIRAcetam (KEPPRA) 500 MG tablet Take 1 tablet every night 90 tablet 3   losartan (COZAAR) 100 MG tablet TAKE ONE TABLET BY MOUTH EVERY EVENING 90 tablet 1   omeprazole (PRILOSEC) 20 MG capsule Take 1 capsule (20 mg total) by mouth 2 (two) times daily. 60 capsule 0   Oxymetazoline HCl (NASAL SPRAY) 0.05 % SOLN Place into the nose.     rivaroxaban (XARELTO) 20 MG TABS tablet TAKE 1 TABLET BY MOUTH DAILY WITH SUPPER 90 tablet 1   traMADol (ULTRAM) 50 MG tablet Take 50 mg by mouth every 6 (six) hours as needed.     zinc gluconate 50 MG tablet Take 50 mg by mouth daily as needed.     No current facility-administered medications on file prior to visit.    BP 110/60   Pulse 61   Temp 98.1 F (36.7 C) (Oral)   Ht 5\' 8"  (1.727 m)   Wt 166 lb (75.3 kg)   SpO2 96%   BMI 25.24 kg/m       Objective:   Physical Exam Constitutional:      Appearance: Normal appearance.  HENT:     Head:   Skin:      Neurological:     Mental Status: He is alert.        Assessment & Plan:  1. Seborrheic keratosis (Primary) - Verbal consent obtained. Using three freeze thaw cycles, each keratosis was frozen. Patient  tolerated procedure well.   2. Skin neoplasm - Concern for BCC. Patient should follow up with dermatology   Shirline Frees, NP

## 2023-05-06 ENCOUNTER — Other Ambulatory Visit: Payer: Self-pay | Admitting: Family Medicine

## 2023-05-06 DIAGNOSIS — M79675 Pain in left toe(s): Secondary | ICD-10-CM | POA: Diagnosis not present

## 2023-05-06 DIAGNOSIS — M62838 Other muscle spasm: Secondary | ICD-10-CM | POA: Diagnosis not present

## 2023-05-06 DIAGNOSIS — R531 Weakness: Secondary | ICD-10-CM | POA: Diagnosis not present

## 2023-05-06 DIAGNOSIS — M7701 Medial epicondylitis, right elbow: Secondary | ICD-10-CM | POA: Diagnosis not present

## 2023-05-06 DIAGNOSIS — L600 Ingrowing nail: Secondary | ICD-10-CM | POA: Diagnosis not present

## 2023-05-06 DIAGNOSIS — R293 Abnormal posture: Secondary | ICD-10-CM | POA: Diagnosis not present

## 2023-05-06 DIAGNOSIS — B351 Tinea unguium: Secondary | ICD-10-CM | POA: Diagnosis not present

## 2023-05-26 DIAGNOSIS — M9903 Segmental and somatic dysfunction of lumbar region: Secondary | ICD-10-CM | POA: Diagnosis not present

## 2023-05-26 DIAGNOSIS — M9905 Segmental and somatic dysfunction of pelvic region: Secondary | ICD-10-CM | POA: Diagnosis not present

## 2023-05-26 DIAGNOSIS — M5136 Other intervertebral disc degeneration, lumbar region with discogenic back pain only: Secondary | ICD-10-CM | POA: Diagnosis not present

## 2023-05-26 DIAGNOSIS — M9904 Segmental and somatic dysfunction of sacral region: Secondary | ICD-10-CM | POA: Diagnosis not present

## 2023-06-05 ENCOUNTER — Other Ambulatory Visit: Payer: Self-pay | Admitting: Family Medicine

## 2023-06-12 ENCOUNTER — Other Ambulatory Visit: Payer: Self-pay | Admitting: Cardiology

## 2023-06-12 DIAGNOSIS — Z7901 Long term (current) use of anticoagulants: Secondary | ICD-10-CM

## 2023-06-12 NOTE — Telephone Encounter (Signed)
 Prescription refill request for Xarelto received.  Indication: PAF Last office visit: 12/31/22  S Tolia DO Weight: 72.4kg Age: 83 Scr: 1.05 on 12/24/22  Epic CrCl: 54.59  Based on above findings Xarelto 20mg  daily is the appropriate dose.  Refill approved.

## 2023-07-09 ENCOUNTER — Ambulatory Visit (INDEPENDENT_AMBULATORY_CARE_PROVIDER_SITE_OTHER): Payer: PPO

## 2023-07-09 VITALS — Ht 68.0 in | Wt 166.0 lb

## 2023-07-09 DIAGNOSIS — Z0000 Encounter for general adult medical examination without abnormal findings: Secondary | ICD-10-CM | POA: Diagnosis not present

## 2023-07-09 DIAGNOSIS — M5136 Other intervertebral disc degeneration, lumbar region with discogenic back pain only: Secondary | ICD-10-CM | POA: Diagnosis not present

## 2023-07-09 DIAGNOSIS — M9904 Segmental and somatic dysfunction of sacral region: Secondary | ICD-10-CM | POA: Diagnosis not present

## 2023-07-09 DIAGNOSIS — M9905 Segmental and somatic dysfunction of pelvic region: Secondary | ICD-10-CM | POA: Diagnosis not present

## 2023-07-09 DIAGNOSIS — M9903 Segmental and somatic dysfunction of lumbar region: Secondary | ICD-10-CM | POA: Diagnosis not present

## 2023-07-09 NOTE — Patient Instructions (Addendum)
 [name] , Thank you for taking time out of your busy schedule to complete your Annual Wellness Visit with me. I enjoyed our conversation and look forward to speaking with you again next year. I, as well as your care team,  appreciate your ongoing commitment to your health goals. Please review the following plan we discussed and let me know if I can assist you in the future. Your Game plan/ To Do List    Referrals: If you haven't heard from the office you've been referred to, please reach out to them at the phone provided.   Follow up Visits: Next Medicare AWV with our clinical staff: 05/14/24 @ 3p   Have you seen your provider in the last 6 months (3 months if uncontrolled diabetes)? Yes 04/01/23 Next Office Visit with your provider:   Clinician Recommendations:  Aim for 30 minutes of exercise or brisk walking, 6-8 glasses of water, and 5 servings of fruits and vegetables each day.       This is a list of the screening recommended for you and due dates:  Health Maintenance  Topic Date Due   COVID-19 Vaccine (1) Never done   Zoster (Shingles) Vaccine (1 of 2) Never done   Pneumonia Vaccine (2 of 2 - PCV) 11/28/2011   Flu Shot  09/26/2023   Medicare Annual Wellness Visit  07/08/2024   DTaP/Tdap/Td vaccine (2 - Td or Tdap) 10/10/2032   HPV Vaccine  Aged Out   Meningitis B Vaccine  Aged Out    Advanced directives: (Declined) Advance directive discussed with you today. Even though you declined this today, please call our office should you change your mind, and we can give you the proper paperwork for you to fill out. Advance Care Planning is important because it:  [x]  Makes sure you receive the medical care that is consistent with your values, goals, and preferences  [x]  It provides guidance to your family and loved ones and reduces their decisional burden about whether or not they are making the right decisions based on your wishes.  Follow the link provided in your after visit  summary or read over the paperwork we have mailed to you to help you started getting your Advance Directives in place. If you need assistance in completing these, please reach out to us  so that we can help you!  See attachments for Preventive Care and Fall Prevention Tips.

## 2023-07-09 NOTE — Progress Notes (Signed)
 Subjective:   Drew Smith is a 83 y.o. who presents for a Medicare Wellness preventive visit.  As a reminder, Annual Wellness Visits don't include a physical exam, and some assessments may be limited, especially if this visit is performed virtually. We may recommend an in-person visit if needed.  Visit Complete: Virtual I connected with  Thedford Finland on 07/09/23 by a audio enabled telemedicine application and verified that I am speaking with the correct person using two identifiers.  Patient Location: Home  Provider Location: Home Office  I discussed the limitations of evaluation and management by telemedicine. The patient expressed understanding and agreed to proceed.  Vital Signs: Because this visit was a virtual/telehealth visit, some criteria may be missing or patient reported. Any vitals not documented were not able to be obtained and vitals that have been documented are patient reported.    Persons Participating in Visit: Patient.  AWV Questionnaire: No: Patient Medicare AWV questionnaire was not completed prior to this visit.  Cardiac Risk Factors include: advanced age (>26men, >35 women);male gender;hypertension     Objective:     Today's Vitals   07/09/23 1509  Weight: 166 lb (75.3 kg)  Height: 5\' 8"  (1.727 m)   Body mass index is 25.24 kg/m.     07/09/2023    3:17 PM 04/15/2023    2:24 PM 10/01/2022    1:49 PM 07/01/2022    4:04 PM 03/04/2022    1:57 PM 07/31/2021    2:04 PM 06/27/2021    3:59 PM  Advanced Directives  Does Patient Have a Medical Advance Directive? No No Yes Yes Yes Yes No  Type of Aeronautical engineer of [place];Living will Living will Living will   Does patient want to make changes to medical advance directive?       No - Patient declined  Copy of Healthcare Power of Attorney in Chart?    No - copy requested     Would patient like information on creating a medical advance directive? No - Patient declined      No -  Patient declined    Current Medications (verified) Outpatient Encounter Medications as of 07/09/2023  Medication Sig   ciclopirox  (PENLAC ) 8 % solution Apply topically at bedtime. Apply over nail and surrounding skin. Apply daily over previous coat. After seven (7) days, may remove with alcohol and continue cycle.   diltiazem  (TIAZAC ) 120 MG 24 hr capsule TAKE 1 CAPSULE BY MOUTH EVERY MORNING   ferrous sulfate 324 MG TBEC Take 324 mg by mouth daily.   hydrochlorothiazide  (MICROZIDE ) 12.5 MG capsule TAKE 1 CAPSULE BY MOUTH DAILY   ketoconazole (NIZORAL) 2 % cream SMARTSIG:1 Application Topical 1 to 2 Times Daily   levETIRAcetam  (KEPPRA ) 500 MG tablet Take 1 tablet every night   losartan  (COZAAR ) 100 MG tablet TAKE ONE TABLET BY MOUTH EVERY EVENING   omeprazole  (PRILOSEC) 20 MG capsule TAKE 1 CAPSULE BY MOUTH 2 TIMES A DAY   Oxymetazoline HCl (NASAL SPRAY) 0.05 % SOLN Place into the nose.   traMADol  (ULTRAM ) 50 MG tablet Take 50 mg by mouth every 6 (six) hours as needed.   XARELTO  20 MG TABS tablet TAKE ONE TABLET BY MOUTH EVERY EVENING WITH DINNER   zinc gluconate 50 MG tablet Take 50 mg by mouth daily as needed.   No facility-administered encounter medications on file as of 07/09/2023.    Allergies (verified) Patient has no known allergies.   History: Past Medical  History:  Diagnosis Date   Arthritis    Asthma    Cancer (HCC)    prostate   GERD (gastroesophageal reflux disease)    Guillain-Barre syndrome (HCC) 02/25/1998   Hyperlipidemia    Hypertension    Seizures (HCC)    Past Surgical History:  Procedure Laterality Date   CATARACT EXTRACTION     EYE SURGERY  02/26/1943   83 years old   HERNIA REPAIR  02/25/1993   NOSE SURGERY  02/26/1988   PROSTATE SURGERY  02/25/1998   seed inplants   SHOULDER SURGERY  02/25/2006   Family History  Problem Relation Age of Onset   Alcohol abuse Father    Hyperlipidemia Brother    Stroke Brother    Diabetes Brother        type ll    Bladder Cancer Brother    Diabetes Brother    Social History   Socioeconomic History   Marital status: Divorced    Spouse name: Not on file   Number of children: 3   Years of education: Not on file   Highest education level: Some college, no degree  Occupational History   Occupation: retired  Tobacco Use   Smoking status: Never   Smokeless tobacco: Never  Vaping Use   Vaping status: Never Used  Substance and Sexual Activity   Alcohol use: Yes    Alcohol/week: 5.0 standard drinks of alcohol    Types: 5 Standard drinks or equivalent per week    Comment: quit drinking wine, now drinks 1 liquor drink a night   Drug use: No   Sexual activity: Not Currently  Other Topics Concern   Not on file  Social History Narrative   Lives alone in one level. Has two sons 1 daughter and friends who serve as support.   Former Psychologist, prison and probation services; Enjoys Mudlogger, reading   Originally from [place]   Right handed    Caffeine 1 cup daily   Social Drivers of [employer] Strain: Low Risk  (07/09/2023)   Overall Financial Resource Strain (CARDIA)    Difficulty of Paying Living Expenses: Not hard at all  Food Insecurity: No Food Insecurity (07/09/2023)   Hunger Vital Sign    Worried About Running Out of Food in the Last Year: Never true    Ran Out of Food in the Last Year: Never true  Transportation Needs: No Transportation Needs (07/09/2023)   PRAPARE - Administrator, Civil Service (Medical): No    Lack of Transportation (Non-Medical): No  Physical Activity: Sufficiently Active (07/09/2023)   Exercise Vital Sign    Days of Exercise per Week: 7 days    Minutes of Exercise per Session: 30 min  Stress: No Stress Concern Present (07/09/2023)   [employer] of Occupational Health - Occupational Stress Questionnaire    Feeling of Stress : Not at all  Social Connections: Moderately Integrated (07/09/2023)   Social Connection and Isolation Panel [NHANES]    Frequency  of Communication with Friends and Family: More than three times a week    Frequency of Social Gatherings with Friends and Family: More than three times a week    Attends Religious Services: More than 4 times per year    Active Member of [employer] or Organizations: Yes    Attends Engineer, structural: More than 4 times per year    Marital Status: Divorced    Tobacco Counseling Counseling given: Not Answered    Clinical Intake:  Pre-visit preparation completed: Yes  Pain : No/denies pain     BMI - recorded: 25.24 Nutritional Status: BMI 25 -29 Overweight Nutritional Risks: None Diabetes: No  Lab Results  Component Value Date   HGBA1C 5.5 05/15/2021   HGBA1C 5.8 (A) 06/28/2020   HGBA1C 5.4 12/08/2015     How often do you need to have someone help you when you read instructions, pamphlets, or other written materials from your doctor or pharmacy?: 1 - Never  Interpreter Needed?: No  Information entered by :: Farris Hong LPN   Activities of Daily Living     07/09/2023    3:16 PM  In your present state of health, do you have any difficulty performing the following activities:  Hearing? 0  Vision? 0  Difficulty concentrating or making decisions? 0  Walking or climbing stairs? 0  Dressing or bathing? 0  Doing errands, shopping? 0  Preparing Food and eating ? N  Using the Toilet? N  In the past six months, have you accidently leaked urine? Y  Comment Followed by PCP  Do you have problems with loss of bowel control? N  Managing your Medications? N  Managing your Finances? N  Housekeeping or managing your Housekeeping? N    Patient Care Team: Marquetta Sit, MD as PCP - General (Family Medicine) Olinda Bertrand, DO as PCP - Cardiology (Cardiology) Evalene Hilda, [place] (Ophthalmology) Alver Austin, [facility] (Inactive) as Pharmacist (Pharmacist) Jhonny Moss, MD as Consulting Physician (Neurology) Olinda Bertrand, DO as Consulting Physician  (Cardiology)  Indicate any recent Medical Services you may have received from other than [facility] providers in the past year (date may be approximate).     Assessment:    This is a routine wellness examination for [place].  Hearing/Vision screen Hearing Screening - Comments:: Denies hearing difficulties   Vision Screening - Comments:: Wears rx glasses - up to date with [facility] exams with  [facility]   Goals Addressed               This Visit's Progress     Remain active (pt-stated)         Depression Screen     07/09/2023    3:14 PM 04/01/2023    3:53 PM 07/01/2022    4:01 PM 04/30/2022   10:59 AM 03/12/2022    1:39 PM 11/20/2021    2:04 PM 06/27/2021    3:55 PM  PHQ 2/9 Scores  PHQ - 2 Score 0 2 0 4 1 2  0  PHQ- 9 Score  14  12  10      Fall Risk     07/09/2023    3:17 PM 04/15/2023    2:24 PM 04/01/2023    3:53 PM 10/01/2022    1:49 PM 07/01/2022    4:03 PM  Fall Risk   Falls in the past year? 0 0 0 0 0  Number falls in past yr: 0 0 0 0 0  Injury with Fall? 0 0 0 0 0  Risk for fall due to : No Fall Risks    No Fall Risks  Follow up Falls prevention discussed;Falls evaluation completed Falls evaluation completed  Falls evaluation completed Falls prevention discussed    MEDICARE RISK AT HOME:  Medicare Risk at Home Any stairs in or around the home?: No If so, are there any without handrails?: No Home free of loose throw rugs in walkways, pet beds, electrical cords, etc?: Yes Adequate lighting in your home to  reduce risk of falls?: Yes Life alert?: No Use of a cane, walker or w/c?: No Grab bars in the bathroom?: No Shower chair or bench in shower?: No Elevated toilet seat or a handicapped toilet?: No  TIMED UP AND GO:  Was the test performed?  No  Cognitive Function: 6CIT completed    03/20/2018   10:43 AM  MMSE - Mini Mental State Exam  Orientation to time 5  Orientation to Place 5  Registration 3  Attention/ Calculation 5  Recall 3  Language- name 2  objects 2  Language- repeat 1  Language- follow 3 step command 3  Language- read & follow direction 1  Write a sentence 1  Copy design 1  Total score 30        07/09/2023    3:17 PM 07/01/2022    4:04 PM 06/27/2021    4:00 PM 03/26/2019   11:49 AM  6CIT Screen  What Year? 0 points 0 points 0 points 0 points  What month? 0 points 0 points 0 points 0 points  What time? 0 points 0 points 0 points 0 points  Count back from 20 0 points 0 points 0 points 0 points  Months in reverse 0 points 0 points 0 points 0 points  Repeat phrase 0 points 0 points 0 points 0 points  Total Score 0 points 0 points 0 points 0 points    Immunizations Immunization History  Administered Date(s) Administered   Pneumococcal Polysaccharide-23 11/28/2010   Tdap 10/11/2022    Screening Tests Health Maintenance  Topic Date Due   COVID-19 Vaccine (1) Never done   Zoster Vaccines- Shingrix (1 of 2) Never done   Pneumonia Vaccine 79+ Years old (2 of 2 - PCV) 11/28/2011   INFLUENZA VACCINE  09/26/2023   Medicare Annual Wellness (AWV)  07/08/2024   DTaP/Tdap/Td (2 - Td or Tdap) 10/10/2032   HPV VACCINES  Aged Out   Meningococcal B Vaccine  Aged Out    Health Maintenance  Health Maintenance Due  Topic Date Due   COVID-19 Vaccine (1) Never done   Zoster Vaccines- Shingrix (1 of 2) Never done   Pneumonia Vaccine 72+ Years old (2 of 2 - PCV) 11/28/2011   Health Maintenance Items Addressed: Patient DX: Barrier disease unable to receive Vaccines  Additional Screening:  Vision Screening: Recommended annual ophthalmology exams for early detection of glaucoma and other disorders of the eye.  Dental Screening: Recommended annual dental exams for proper oral hygiene  Community Resource Referral / Chronic Care Management: CRR required this visit?  No   CCM required this visit?  No   Plan:    I have personally reviewed and noted the following in the patient's chart:   Medical and social history Use  of alcohol, tobacco or illicit drugs  Current medications and supplements including opioid prescriptions. Patient is currently taking opioid prescriptions. Information provided to patient regarding non-opioid alternatives. Patient advised to discuss non-opioid treatment plan with their provider. Functional ability and status Nutritional status Physical activity Advanced directives List of other physicians Hospitalizations, surgeries, and ER visits in previous 12 months Vitals Screenings to include cognitive, depression, and falls Referrals and appointments  In addition, I have reviewed and discussed with patient certain preventive protocols, quality metrics, and best practice recommendations. A written personalized care plan for preventive services as well as general preventive health recommendations were provided to patient.   Dewayne Ford, LPN   1/61/0960   After Visit Summary: (MyChart) Due  to this being a telephonic visit, the after visit summary with patients personalized plan was offered to patient via MyChart   Notes: Nothing significant to report at this time.

## 2023-07-25 IMAGING — MR MR CARD MORPHOLOGY WO/W CM
45 of 48 series · 45 of 48 positions shown · IV contrast (gadavist)
Comparison: none

CLINICAL DATA: 80M with HTN, Afib p/w syncope, had nocturnal pauses
and episode of NEWRAS on monitor. Asymmetric basal septal hypertrophy
on echo

EXAM:
CARDIAC MRI
TECHNIQUE: The patient was scanned on a 1.5 Tesla Siemens magnet. A dedicated
cardiac coil was used. Functional imaging was done using Fiesta
sequences. [DATE], and 4 chamber views were done to assess for RWMA's.
Modified Rodriguez Garcia rule using a short axis stack was used to
calculate an ejection fraction on a dedicated work station using
Circle software. The patient received 8 cc of Gadavist. After 10
minutes inversion recovery sequences were used to assess for
infiltration and scar tissue.
CONTRAST:  8 cc  of Gadavist

[Series 4: [id number] · axial · 8.0mm · 1.48mm/px · 1 of 16 slices shown]
[im 1/16]
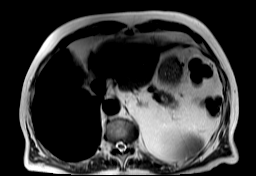

[Series 8: bSSFP · coronal · 8.0mm · 1.61mm/px · 1 of 25 slices shown (1 of 19)]
[im 1/25]
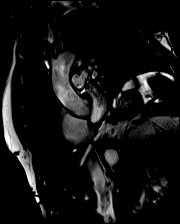

[Series 9: bSSFP · coronal · 8.0mm · 1.61mm/px · 1 of 25 slices shown (2 of 19)]
[im 1/25]
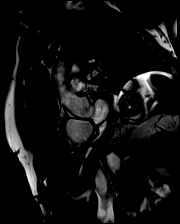

[Series 10: bSSFP · coronal · 8.0mm · 1.61mm/px · 1 of 25 slices shown (3 of 19)]
[im 1/25]
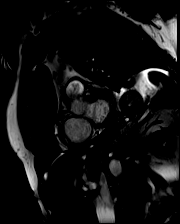

[Series 11: bSSFP · coronal · 8.0mm · 1.61mm/px · 1 of 25 slices shown (4 of 19)]
[im 1/25]
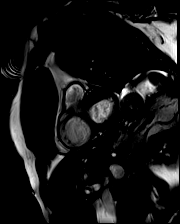

[Series 12: bSSFP · coronal · 8.0mm · 1.61mm/px · 1 of 25 slices shown (5 of 19)]
[im 1/25]
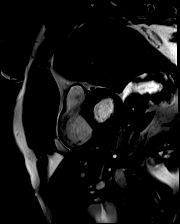

[Series 13: bSSFP · coronal · 8.0mm · 1.61mm/px · 1 of 25 slices shown (6 of 19)]
[im 1/25]
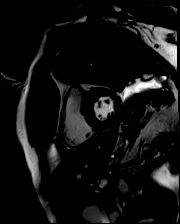

[Series 14: bSSFP · coronal · 8.0mm · 1.61mm/px · 1 of 25 slices shown (7 of 19)]
[im 1/25]
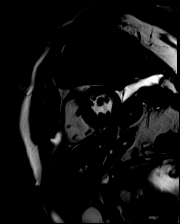

[Series 15: bSSFP · coronal · 8.0mm · 1.61mm/px · 1 of 25 slices shown (8 of 19)]
[im 1/25]
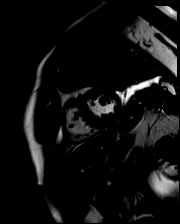

[Series 16: bSSFP · coronal · 8.0mm · 1.61mm/px · 1 of 25 slices shown (9 of 19)]
[im 1/25]
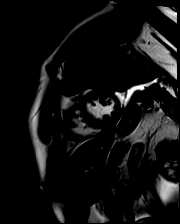

[Series 17: bSSFP · coronal · 8.0mm · 1.61mm/px · 1 of 25 slices shown (10 of 19)]
[im 1/25]
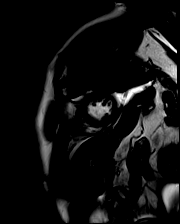

[Series 18: bSSFP · coronal · 8.0mm · 1.61mm/px · 1 of 25 slices shown (11 of 19)]
[im 1/25]
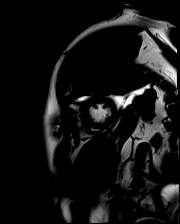

[Series 19: bSSFP · coronal · 8.0mm · 1.61mm/px · 1 of 25 slices shown (12 of 19)]
[im 1/25]
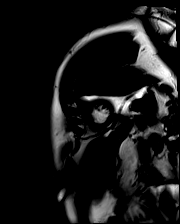

[Series 20: bSSFP · coronal · 8.0mm · 1.61mm/px · 1 of 25 slices shown (13 of 19)]
[im 1/25]
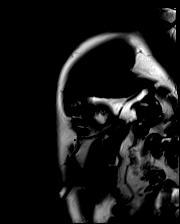

[Series 21: bSSFP · coronal · 8.0mm · 1.61mm/px · 1 of 25 slices shown (14 of 19)]
[im 1/25]
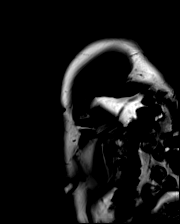

[Series 22: bSSFP · coronal · 8.0mm · 1.61mm/px · 1 of 25 slices shown (15 of 19)]
[im 1/25]
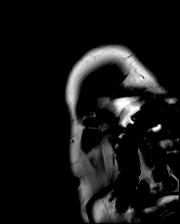

[Series 23: bSSFP · coronal · 8.0mm · 1.61mm/px · 1 of 25 slices shown (16 of 19)]
[im 1/25]
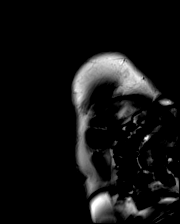

[Series 24: bSSFP · oblique · 6.0mm · 1.41mm/px · 1 of 25 slices shown (17 of 19)]
[im 1/25]
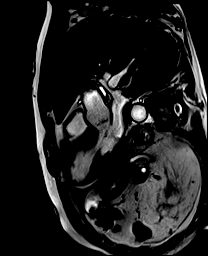

[Series 25: bSSFP · oblique · 6.0mm · 1.41mm/px · 1 of 25 slices shown (18 of 19)]
[im 1/25]
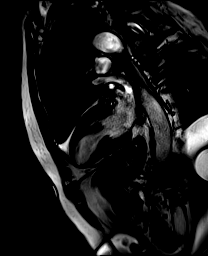

[Series 26: bSSFP · axial · 6.0mm · 1.41mm/px · 1 of 25 slices shown (19 of 19)]
[im 1/25]
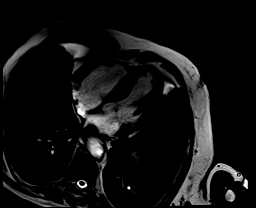

[Series 27: cine_trufi cs bh · coronal · 8.0mm · 1.48mm/px · 1 of 25 slices shown (1 of 16)]
[im 1/25]
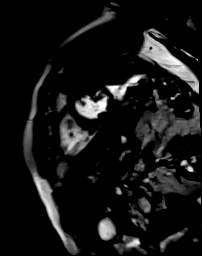

[Series 27: cine_trufi cs bh · coronal · 8.0mm · 1.48mm/px · 1 of 25 slices shown (2 of 16)]
[im 1/25]
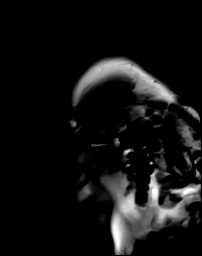

[Series 27: cine_trufi cs bh · coronal · 8.0mm · 1.48mm/px · 1 of 25 slices shown (3 of 16)]
[im 1/25]
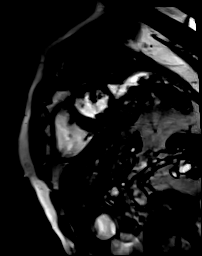

[Series 27: cine_trufi cs bh · coronal · 8.0mm · 1.48mm/px · 1 of 25 slices shown (4 of 16)]
[im 1/25]
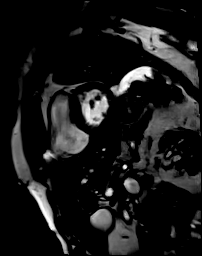

[Series 27: cine_trufi cs bh · coronal · 8.0mm · 1.48mm/px · 1 of 25 slices shown (5 of 16)]
[im 1/25]
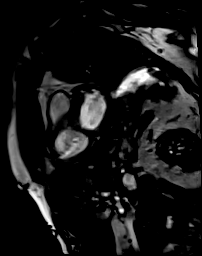

[Series 27: cine_trufi cs bh · coronal · 8.0mm · 1.48mm/px · 1 of 25 slices shown (6 of 16)]
[im 1/25]
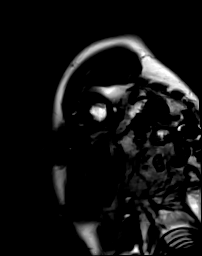

[Series 27: cine_trufi cs bh · coronal · 8.0mm · 1.48mm/px · 1 of 25 slices shown (7 of 16)]
[im 1/25]
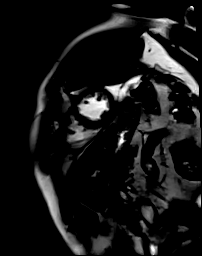

[Series 27: cine_trufi cs bh · coronal · 8.0mm · 1.48mm/px · 1 of 25 slices shown (8 of 16)]
[im 1/25]
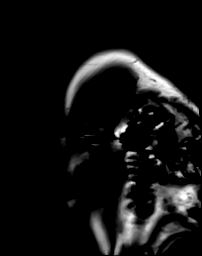

[Series 27: cine_trufi cs bh · coronal · 8.0mm · 1.48mm/px · 1 of 25 slices shown (9 of 16)]
[im 1/25]
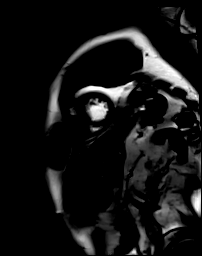

[Series 27: cine_trufi cs bh · coronal · 8.0mm · 1.48mm/px · 1 of 25 slices shown (10 of 16)]
[im 1/25]
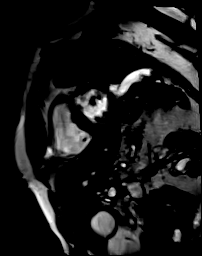

[Series 27: cine_trufi cs bh · coronal · 8.0mm · 1.48mm/px · 1 of 25 slices shown (11 of 16)]
[im 1/25]
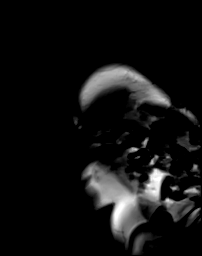

[Series 27: cine_trufi cs bh · coronal · 8.0mm · 1.48mm/px · 1 of 25 slices shown (12 of 16)]
[im 1/25]
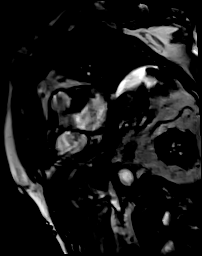

[Series 27: cine_trufi cs bh · coronal · 8.0mm · 1.48mm/px · 1 of 25 slices shown (13 of 16)]
[im 1/25]
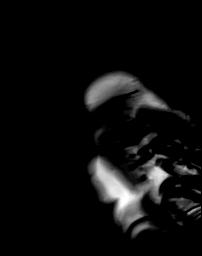

[Series 27: cine_trufi cs bh · coronal · 8.0mm · 1.48mm/px · 1 of 25 slices shown (14 of 16)]
[im 1/25]
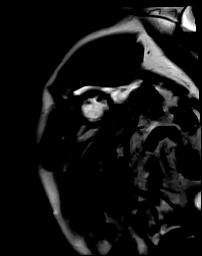

[Series 27: cine_trufi cs bh · coronal · 8.0mm · 1.48mm/px · 1 of 25 slices shown (15 of 16)]
[im 1/25]
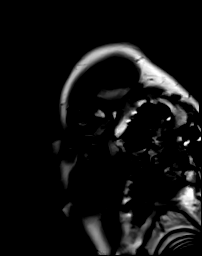

[Series 27: cine_trufi cs bh · coronal · 8.0mm · 1.48mm/px · 1 of 25 slices shown (16 of 16)]
[im 1/25]
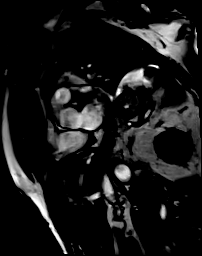

[Series 28: cine_trufi cs free · coronal · 8.0mm · 1.48mm/px · 1 of 25 slices shown (1 of 9)]
[im 1/25]
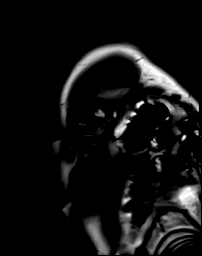

[Series 28: cine_trufi cs free · coronal · 8.0mm · 1.48mm/px · 1 of 25 slices shown (2 of 9)]
[im 1/25]
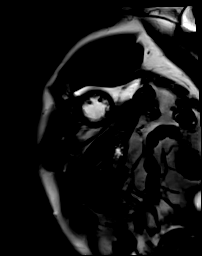

[Series 28: cine_trufi cs free · coronal · 8.0mm · 1.48mm/px · 1 of 25 slices shown (3 of 9)]
[im 1/25]
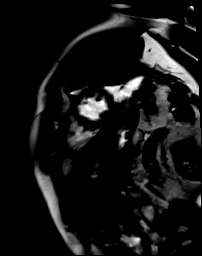

[Series 28: cine_trufi cs free · coronal · 8.0mm · 1.48mm/px · 1 of 25 slices shown (4 of 9)]
[im 1/25]
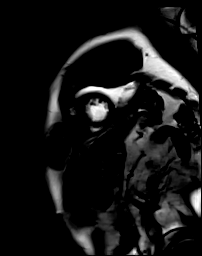

[Series 28: cine_trufi cs free · coronal · 8.0mm · 1.48mm/px · 1 of 25 slices shown (5 of 9)]
[im 1/25]
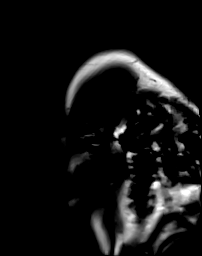

[Series 28: cine_trufi cs free · coronal · 8.0mm · 1.48mm/px · 1 of 25 slices shown (6 of 9)]
[im 1/25]
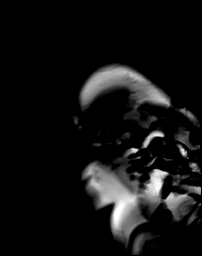

[Series 28: cine_trufi cs free · coronal · 8.0mm · 1.48mm/px · 1 of 25 slices shown (7 of 9)]
[im 1/25]
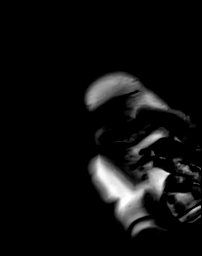

[Series 28: cine_trufi cs free · coronal · 8.0mm · 1.48mm/px · 1 of 25 slices shown (8 of 9)]
[im 1/25]
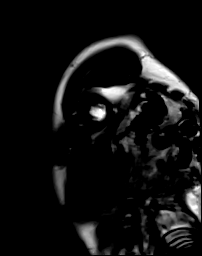

[Series 28: cine_trufi cs free · coronal · 8.0mm · 1.48mm/px · 1 of 25 slices shown (9 of 9)]
[im 1/25]
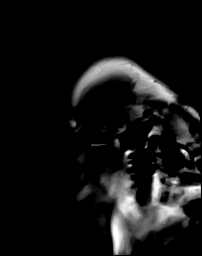

[45 of 48 positions shown; findings below may reference images not displayed]

FINDINGS: Left ventricle:

-Asymmetric septal hypertrophy measuring up to 16mm in basal septum
(10mm in posterior wall)

-Normal size

-Normal systolic function

-Normal ECV (25%)

-Patchy LGE in basal septum and lateral wall. LGE accounts for 5% of
total myocardial mass

LV EF: 54% (Normal 56-78%)

Absolute volumes:

LV EDV: 115mL (Normal 77-195 mL)

LV ESV: 53mL (Normal 19-72 mL)

LV SV: 62mL (Normal 51-133 mL)

CO: 4.2L/min (Normal 2.8-8.8 L/min)

Indexed volumes:

LV EDV: 61mL/sq-m (Normal 47-92 mL/sq-m)

LV ESV: 28mL/sq-m (Normal 13-30 mL/sq-m)

LV SV: 33mL/sq-m (Normal 32-62 mL/sq-m)

CI: 2.2L/min/sq-m (Normal 1.7-4.2 L/min/sq-m)

Right ventricle: Normal size and systolic function

RV EF:  55% (Normal 47-74%)

Absolute volumes:

RV EDV: 125mL (Normal 88-227 mL)

RV ESV: 57mL (Normal 23-103 mL)

RV SV: 68mL (Normal 52-138 mL)

CO: 4.6L/min (Normal 2.8-8.8 L/min)

Indexed volumes:

RV EDV: 66mL/sq-m (Normal 55-105 mL/sq-m)

RV ESV: 30mL/sq-m (Normal 15-43 mL/sq-m)

RV SV: 36mL/sq-m (Normal 32-64 mL/sq-m)

CI: 2.4L/min/sq-m (Normal 1.7-4.2 L/min/sq-m)

Left atrium: Normal size

Right atrium: Mild enlargement

Mitral valve: Trivial regurgitation

Aortic valve: Tricuspid.  No regurgitation

Tricuspid valve: Mild regurgitation

Pulmonic valve: No regurgitation

Aorta: Normal proximal ascending aorta

Pericardium: Normal
IMPRESSION: 1. Asymmetric septal hypertrophy measuring up to 16mm in basal
septum (10mm in posterior wall), meeting criteria for hypertrophic
cardiomyopathy

2. Patchy late gadolinium enhancement in basal septum and lateral
wall, consistent with HCM. LGE accounts for 5% of total myocardial
mass

3.  Normal LV size and systolic function (EF 54%)

4.  Normal RV size and systolic function (EF 55%)

## 2023-07-29 DIAGNOSIS — M9905 Segmental and somatic dysfunction of pelvic region: Secondary | ICD-10-CM | POA: Diagnosis not present

## 2023-07-29 DIAGNOSIS — M9904 Segmental and somatic dysfunction of sacral region: Secondary | ICD-10-CM | POA: Diagnosis not present

## 2023-07-29 DIAGNOSIS — M9903 Segmental and somatic dysfunction of lumbar region: Secondary | ICD-10-CM | POA: Diagnosis not present

## 2023-07-29 DIAGNOSIS — M5136 Other intervertebral disc degeneration, lumbar region with discogenic back pain only: Secondary | ICD-10-CM | POA: Diagnosis not present

## 2023-08-12 ENCOUNTER — Encounter: Payer: Self-pay | Admitting: Podiatry

## 2023-08-12 ENCOUNTER — Ambulatory Visit: Payer: Self-pay | Admitting: Podiatry

## 2023-08-12 DIAGNOSIS — B351 Tinea unguium: Secondary | ICD-10-CM

## 2023-08-12 DIAGNOSIS — M79671 Pain in right foot: Secondary | ICD-10-CM | POA: Diagnosis not present

## 2023-08-12 DIAGNOSIS — M79672 Pain in left foot: Secondary | ICD-10-CM

## 2023-08-12 NOTE — Progress Notes (Signed)
 Patient presents for evaluation and treatment of tenderness and some redness around nails feet.  Tenderness around toes with walking and wearing shoes.  Physical exam:  General appearance: Alert, pleasant, and in no acute distress.  Vascular: Pedal pulses: DP 2/4 B/L, PT 0/4 B/L.  Mild edema lower legs bilaterally  Neurological:    Dermatologic:  Nals thickened, disfigured, discolored 1-5 BL with subungual debris.  Redness and hypertrophic nail folds along nail folds bilaterally but no signs of drainage or infection.  Musculoskeletal:     Diagnosis: 1. Painful onychomycotic nails 1 through 5 bilaterally. 2. Pain toes 1 through 5 bilaterally.  Plan: Debrided onychomycotic nails 1 through 5 bilaterally.  Return 3 months

## 2023-08-13 ENCOUNTER — Other Ambulatory Visit: Payer: Self-pay | Admitting: Cardiology

## 2023-08-13 DIAGNOSIS — M9905 Segmental and somatic dysfunction of pelvic region: Secondary | ICD-10-CM | POA: Diagnosis not present

## 2023-08-13 DIAGNOSIS — I10 Essential (primary) hypertension: Secondary | ICD-10-CM

## 2023-08-13 DIAGNOSIS — M9904 Segmental and somatic dysfunction of sacral region: Secondary | ICD-10-CM | POA: Diagnosis not present

## 2023-08-13 DIAGNOSIS — I480 Paroxysmal atrial fibrillation: Secondary | ICD-10-CM

## 2023-08-13 DIAGNOSIS — M5136 Other intervertebral disc degeneration, lumbar region with discogenic back pain only: Secondary | ICD-10-CM | POA: Diagnosis not present

## 2023-08-13 DIAGNOSIS — M9903 Segmental and somatic dysfunction of lumbar region: Secondary | ICD-10-CM | POA: Diagnosis not present

## 2023-08-13 DIAGNOSIS — I422 Other hypertrophic cardiomyopathy: Secondary | ICD-10-CM

## 2023-09-23 DIAGNOSIS — L570 Actinic keratosis: Secondary | ICD-10-CM | POA: Diagnosis not present

## 2023-09-23 DIAGNOSIS — Z85828 Personal history of other malignant neoplasm of skin: Secondary | ICD-10-CM | POA: Diagnosis not present

## 2023-09-23 DIAGNOSIS — M5136 Other intervertebral disc degeneration, lumbar region with discogenic back pain only: Secondary | ICD-10-CM | POA: Diagnosis not present

## 2023-09-23 DIAGNOSIS — D225 Melanocytic nevi of trunk: Secondary | ICD-10-CM | POA: Diagnosis not present

## 2023-09-23 DIAGNOSIS — L814 Other melanin hyperpigmentation: Secondary | ICD-10-CM | POA: Diagnosis not present

## 2023-09-23 DIAGNOSIS — L821 Other seborrheic keratosis: Secondary | ICD-10-CM | POA: Diagnosis not present

## 2023-09-23 DIAGNOSIS — Z08 Encounter for follow-up examination after completed treatment for malignant neoplasm: Secondary | ICD-10-CM | POA: Diagnosis not present

## 2023-09-23 DIAGNOSIS — M9905 Segmental and somatic dysfunction of pelvic region: Secondary | ICD-10-CM | POA: Diagnosis not present

## 2023-09-23 DIAGNOSIS — M9904 Segmental and somatic dysfunction of sacral region: Secondary | ICD-10-CM | POA: Diagnosis not present

## 2023-09-23 DIAGNOSIS — L905 Scar conditions and fibrosis of skin: Secondary | ICD-10-CM | POA: Diagnosis not present

## 2023-09-23 DIAGNOSIS — M9903 Segmental and somatic dysfunction of lumbar region: Secondary | ICD-10-CM | POA: Diagnosis not present

## 2023-09-23 DIAGNOSIS — Z85820 Personal history of malignant melanoma of skin: Secondary | ICD-10-CM | POA: Diagnosis not present

## 2023-09-30 ENCOUNTER — Other Ambulatory Visit: Payer: Self-pay | Admitting: Neurology

## 2023-11-04 ENCOUNTER — Ambulatory Visit: Admitting: Podiatry

## 2023-11-04 ENCOUNTER — Encounter: Payer: Self-pay | Admitting: Podiatry

## 2023-11-04 VITALS — Ht 68.0 in | Wt 166.0 lb

## 2023-11-04 DIAGNOSIS — M79671 Pain in right foot: Secondary | ICD-10-CM | POA: Diagnosis not present

## 2023-11-04 DIAGNOSIS — B351 Tinea unguium: Secondary | ICD-10-CM | POA: Diagnosis not present

## 2023-11-04 DIAGNOSIS — M79672 Pain in left foot: Secondary | ICD-10-CM

## 2023-11-04 NOTE — Progress Notes (Signed)
 Patient presents for evaluation and treatment of tenderness and some redness around nails feet.  Tenderness around toes with walking and wearing shoes.  Physical exam:  General appearance: Alert, pleasant, and in no acute distress.  Vascular: Pedal pulses: DP 2/4 B/L, PT 0/4 B/L. Mild edema lower legs bilaterally  Neu  Dermatologic:  Nails thickened, disfigured, discolored 1-5 BL with subungual debris.  Redness and hypertrophic nail folds along nail folds bilaterally but no signs of drainage or infection.  Musculoskeletal:     Diagnosis: 1. Painful onychomycotic nails 1 through 5 bilaterally. 2. Pain toes 1 through 5 bilaterally.  Plan: -Debrided onychomycotic nails 1 through 5 bilaterally.  Sharply debrided nails with nail clipper and reduced with a power bur.  Return 3 months [facility]

## 2023-11-16 ENCOUNTER — Other Ambulatory Visit: Payer: Self-pay | Admitting: Cardiology

## 2023-11-16 DIAGNOSIS — I10 Essential (primary) hypertension: Secondary | ICD-10-CM

## 2023-11-19 ENCOUNTER — Encounter: Payer: Self-pay | Admitting: Internal Medicine

## 2023-11-19 ENCOUNTER — Ambulatory Visit (INDEPENDENT_AMBULATORY_CARE_PROVIDER_SITE_OTHER): Admitting: Internal Medicine

## 2023-11-19 VITALS — BP 110/60 | HR 60 | Temp 98.2°F | Wt 160.8 lb

## 2023-11-19 DIAGNOSIS — R0982 Postnasal drip: Secondary | ICD-10-CM | POA: Diagnosis not present

## 2023-11-19 DIAGNOSIS — R052 Subacute cough: Secondary | ICD-10-CM | POA: Diagnosis not present

## 2023-11-19 NOTE — Progress Notes (Signed)
 Established Patient Office Visit     CC/Reason for Visit: Cough   HPI: Drew Smith is a 83 y.o. male who is coming in today for the above mentioned reasons.  Has been having a slightly productive cough of clear to whitish sputum for the past 6 to 8 weeks.  Has also noted some postnasal drip.  No shortness of breath, in fact plays golf frequently without issues.   Past Medical/Surgical History: Past Medical History:  Diagnosis Date   Arthritis    Asthma    Cancer (HCC)    prostate   GERD (gastroesophageal reflux disease)    Guillain-Barre syndrome 02/25/1998   Hyperlipidemia    Hypertension    Seizures (HCC)     Past Surgical History:  Procedure Laterality Date   CATARACT EXTRACTION     EYE SURGERY  02/26/1943   83 years old   HERNIA REPAIR  02/25/1993   NOSE SURGERY  02/26/1988   PROSTATE SURGERY  02/25/1998   seed inplants   SHOULDER SURGERY  02/25/2006    Social History:  reports that he has never smoked. He has never used smokeless tobacco. He reports current alcohol use of about 5.0 standard drinks of alcohol per week. He reports that he does not use drugs.  Allergies: Allergies  Allergen Reactions   Flublok [Influenza Vaccine Recombinant]    Other     Shingles vaccine    Family History:  Family History  Problem Relation Age of Onset   Alcohol abuse Father    Hyperlipidemia Brother    Stroke Brother    Diabetes Brother        type ll   Bladder Cancer Brother    Diabetes Brother      Current Outpatient Medications:    ciclopirox  (PENLAC ) 8 % solution, Apply topically at bedtime. Apply over nail and surrounding skin. Apply daily over previous coat. After seven (7) days, may remove with alcohol and continue cycle., Disp: 6.6 mL, Rfl: 11   diltiazem  (TIAZAC ) 120 MG 24 hr capsule, TAKE 1 CAPSULE BY MOUTH EVERY MORNING, Disp: 90 capsule, Rfl: 1   ferrous sulfate 324 MG TBEC, Take 324 mg by mouth daily., Disp: , Rfl:    hydrochlorothiazide   (MICROZIDE ) 12.5 MG capsule, TAKE 1 CAPSULE BY MOUTH DAILY, Disp: 90 capsule, Rfl: 0   ketoconazole (NIZORAL) 2 % cream, SMARTSIG:1 Application Topical 1 to 2 Times Daily, Disp: , Rfl:    levETIRAcetam  (KEPPRA ) 500 MG tablet, TAKE 1 TABLET BY MOUTH TWICE A DAY, Disp: 180 tablet, Rfl: 0   losartan  (COZAAR ) 100 MG tablet, TAKE ONE TABLET BY MOUTH EVERY EVENING, Disp: 90 tablet, Rfl: 1   omeprazole  (PRILOSEC) 20 MG capsule, TAKE 1 CAPSULE BY MOUTH 2 TIMES A DAY, Disp: 180 capsule, Rfl: 0   Oxymetazoline HCl (NASAL SPRAY) 0.05 % SOLN, Place into the nose., Disp: , Rfl:    traMADol  (ULTRAM ) 50 MG tablet, Take 50 mg by mouth every 6 (six) hours as needed., Disp: , Rfl:    XARELTO  20 MG TABS tablet, TAKE ONE TABLET BY MOUTH EVERY EVENING WITH DINNER, Disp: 90 tablet, Rfl: 1   zinc gluconate 50 MG tablet, Take 50 mg by mouth daily as needed., Disp: , Rfl:   Review of Systems:  Negative unless indicated in HPI.   Physical Exam: Vitals:   11/19/23 1529  BP: 110/60  Pulse: 60  Temp: 98.2 F (36.8 C)  TempSrc: Oral  SpO2: 98%  Weight: 160 lb  12.8 oz (72.9 kg)    Body mass index is 24.45 kg/m.   Physical Exam Vitals reviewed.  Constitutional:      Appearance: Normal appearance.  HENT:     Right Ear: Tympanic membrane, ear canal and external ear normal.     Left Ear: Tympanic membrane, ear canal and external ear normal.     Mouth/Throat:     Mouth: Mucous membranes are moist.     Pharynx: Oropharynx is clear.  Eyes:     Conjunctiva/sclera: Conjunctivae normal.     Pupils: Pupils are equal, round, and reactive to light.  Cardiovascular:     Rate and Rhythm: Normal rate and regular rhythm.  Pulmonary:     Effort: Pulmonary effort is normal.     Breath sounds: Normal breath sounds.  Neurological:     Mental Status: He is alert.      Impression and Plan:  Subacute cough  PND (post-nasal drip)   - Suspect cough might be due to postnasal drip and seasonal allergies.  Have  advised daily antihistamine use to see if this helps.  Time spent:22 minutes reviewing chart, interviewing and examining patient and formulating plan of care.     Tully Theophilus Andrews, MD [facility]

## 2023-11-20 ENCOUNTER — Ambulatory Visit: Attending: Cardiology | Admitting: Cardiology

## 2023-11-20 ENCOUNTER — Other Ambulatory Visit: Payer: Self-pay | Admitting: *Deleted

## 2023-11-20 ENCOUNTER — Encounter: Payer: Self-pay | Admitting: Cardiology

## 2023-11-20 VITALS — BP 138/53 | HR 57 | Resp 16 | Ht 68.0 in | Wt 162.2 lb

## 2023-11-20 DIAGNOSIS — Z7901 Long term (current) use of anticoagulants: Secondary | ICD-10-CM

## 2023-11-20 DIAGNOSIS — I480 Paroxysmal atrial fibrillation: Secondary | ICD-10-CM

## 2023-11-20 DIAGNOSIS — I10 Essential (primary) hypertension: Secondary | ICD-10-CM

## 2023-11-20 DIAGNOSIS — I422 Other hypertrophic cardiomyopathy: Secondary | ICD-10-CM

## 2023-11-20 NOTE — Progress Notes (Signed)
 Cardiology Office Note:  .   Date:  11/20/2023  ID:  Drew Smith, DOB January 04, 1941, MRN [id number] PCP:  Micheal Wolm ORN, MD  Former Cardiology Providers: N/A [facility] Providers Cardiologist:  Madonna Large, DO , [facility] (established care 03/15/2021) Electrophysiologist:  None  Click to update primary MD,subspecialty MD or APP then REFRESH:1}    Chief Complaint  Patient presents with   Atrial Fibrillation   Follow-up    History of Present Illness: .   Drew Smith is a 83 y.o. Caucasian male whose past medical history and cardiovascular risk factors includes: History of syncope, conduction disorder, paroxysmal atrial fibrillation, hypertrophic cardiomyopathy (cardiac MRI), HTN, HLD, prostate cancer.   Patient is being followed by the practice given his HCM and paroxysmal atrial fibrillation.   He underwent cardiovascular testing as noted below.  Notably his extended Holter monitor had episodes of nocturnal pauses and one episode of pause with complete heart block during awakening hours and paroxysmal atrial fibrillation.  He was being considered for pacemaker implantation.  In the interim, he had an episode of near syncope in March 2023 which resulted in elective hospitalization.  He was evaluated by cardiac electrophysiology inpatient and underwent cardiac MRI and was noted to have asymmetrical septal hypertrophy consistent with HCM.  Cardiac electrophysiology suspected that his episodes of pauses/transient complete heart block likely due to vagal mediated events and therefore patient did not undergo device therapy.  Patient was last seen in the office in November 2024 at which time he was doing relatively well and recommended follow-up H&H and BMP if he is on anticoagulation.  Since last office visit patient denies any anginal chest pain or heart failure symptoms.   No hospitalizations or urgent care visits for cardiovascular reasons.   He has been compliant with his  medical therapy.  Does not endorse evidence of bleeding.   Physical endurance remains stable.   Review of Systems: .   Review of Systems  Cardiovascular:  Negative for chest pain, dyspnea on exertion, leg swelling, near-syncope, orthopnea, palpitations, paroxysmal nocturnal dyspnea and syncope.  Respiratory:  Negative for shortness of breath.        URI symptoms  Hematologic/Lymphatic: Negative for bleeding problem.    Studies Reviewed:   EKG: EKG Interpretation Date/Time:  Thursday November 20 2023 16:08:19 EDT Ventricular Rate:  56 PR Interval:  218 QRS Duration:  104 QT Interval:  412 QTC Calculation: 397 R Axis:   -39  Text Interpretation: Probable Ectopic atrial rhythm Left axis deviation Left ventricular hypertrophy with repolarization abnormality ( R in aVL , Cornell product ) Cannot rule out Septal infarct (cited on or before 31-Dec-2022) Lateral infarct (cited on or before 31-Dec-2022) When compared with ECG of 31-Dec-2022 11:38, Ectopic atrial rhythm has replaced Sinus rhythm Confirmed by Large Madonna ([id number]) on 11/20/2023 4:38:04 PM  Echocardiogram: 03/27/2021: Study Quality: Technically difficult study. Normal LV systolic function with visual EF 60-65%. Left ventricle cavity is normal in size. Moderate left ventricular hypertrophy, prominent basal septum (IVSd1.67cm and LVPWd 1.23cm). Normal global wall motion. Normal diastolic filling pattern, normal LAP.  Trace aortic regurgitation. Trace tricuspid regurgitation. No evidence of pulmonary hypertension. No prior study for comparison.   Stress Testing: Exercise treadmill stress test 04/13/2021: Exercise treadmill stress test performed using Bruce protocol. Patient reached 4.6 METS, and 87% of age predicted maximum heart rate. Exercise capacity was low. No chest pain reported. Normal heart rate response with hypertensive exercise response. Peak BP 220/80 mmHg. Stress EKG shows  no ischemic changes. Low risk study.   Heart  Catheterization: None   Carotid artery duplex 03/27/2021: Duplex suggests stenosis in the right internal carotid artery (1-15%). Duplex suggests stenosis in the left internal carotid artery (1-15%). Mild diffuse homogeneous plaque noted in bilateral carotid arteries. Left carotid exhibits tortuosity and may be a source of bruit.  Antegrade right vertebral artery flow. Antegrade left vertebral artery flow. Follow up studies if clinically indicated.   14 day extended Holter monitor: Dominant rhythm normal sinus. Heart rate 22-146 bpm. Avg HR 66 bpm. Atrial fibrillation burden 3% (heart rate 42-146 bpm, avg HR 89 bpm) 6 pauses greater than 3 seconds in duration (longest 7.3 seconds, underlying rhythm sinus, 2:33 AM, 03/25/2021).  On 03/29/2021, 8:20 AM, underlying rhythm sinus, with high degree AV block with 3.8 second pause. Total ventricular ectopic burden <1%. Total supraventricular ectopic burden <1%. Patient triggered events: 2. Normal sinus rhythm without any dysrhythmias.    Cardiac MRI 05/07/2021: 1. Asymmetric septal hypertrophy measuring up to 16mm in basal septum (10mm in posterior wall), meeting criteria for hypertrophic cardiomyopathy   2. Patchy late gadolinium enhancement in basal septum and lateral wall, consistent with HCM. LGE accounts for 5% of total myocardial mass   3.  Normal LV size and systolic function (EF 54%)   4.  Normal RV size and systolic function (EF 55%)  Risk Assessment/Calculations:   Click Here to Calculate/Change CHADS2VASc Score The patient's CHADS2-VASc score is 3, indicating a 3.2% annual risk of stroke. CHF History: No HTN History: Yes Diabetes History: No Stroke History: No Vascular Disease History: No  Labs:       Latest Ref Rng & Units 12/24/2022   11:25 AM 07/02/2022   12:47 PM 09/13/2021   12:31 PM  CBC  Hemoglobin 13.0 - 17.7 g/dL 86.3  87.1  85.9   Hematocrit 37.5 - 51.0 % 40.8  38.1  41.6        Latest Ref Rng & Units  12/24/2022   11:25 AM 07/02/2022   12:47 PM 12/25/2021   11:49 AM  BMP  Glucose 70 - 99 mg/dL 882  95  890   BUN 8 - 27 mg/dL 20  21  16    Creatinine 0.76 - 1.27 mg/dL 8.94  9.00  8.93   BUN/Creat Ratio 10 - 24 19  21  15    Sodium 134 - 144 mmol/L 137  135  135   Potassium 3.5 - 5.2 mmol/L 4.7  4.8  4.7   Chloride 96 - 106 mmol/L 102  100  98   CO2 20 - 29 mmol/L 23  23  24    Calcium 8.6 - 10.2 mg/dL 9.0  8.9  9.2       Latest Ref Rng & Units 12/24/2022   11:25 AM 07/02/2022   12:47 PM 12/25/2021   11:49 AM  CMP  Glucose 70 - 99 mg/dL 882  95  890   BUN 8 - 27 mg/dL 20  21  16    Creatinine 0.76 - 1.27 mg/dL 8.94  9.00  8.93   Sodium 134 - 144 mmol/L 137  135  135   Potassium 3.5 - 5.2 mmol/L 4.7  4.8  4.7   Chloride 96 - 106 mmol/L 102  100  98   CO2 20 - 29 mmol/L 23  23  24    Calcium 8.6 - 10.2 mg/dL 9.0  8.9  9.2     Lab Results  Component Value Date   CHOL  163 04/05/2019   HDL 47.60 04/05/2019   LDLCALC 95 04/05/2019   TRIG 103.0 04/05/2019   CHOLHDL 3 04/05/2019   No results for input(s): LIPOA in the last 8760 hours. No components found for: NTPROBNP No results for input(s): PROBNP in the last 8760 hours. No results for input(s): TSH in the last 8760 hours.   Physical Exam:    Today's Vitals   11/20/23 1603  BP: (!) 138/53  Pulse: (!) 57  Resp: 16  SpO2: 96%  Weight: 162 lb 3.2 oz (73.6 kg)  Height: 5' 8 (1.727 m)   Body mass index is 24.66 kg/m. Wt Readings from Last 3 Encounters:  11/20/23 162 lb 3.2 oz (73.6 kg)  11/19/23 160 lb 12.8 oz (72.9 kg)  11/04/23 166 lb (75.3 kg)    Physical Exam  Constitutional: No distress.  Age appropriate, hemodynamically stable.   Neck: No JVD present.  Cardiovascular: Regular rhythm, S1 normal, S2 normal, intact distal pulses and normal pulses. Bradycardia present. Exam reveals no gallop, no S3 and no S4.  No murmur heard. Pulmonary/Chest: Effort normal and breath sounds normal. No stridor. He has no  wheezes. He has no rales.  Abdominal: Soft. Bowel sounds are normal. He exhibits no distension. There is no abdominal tenderness.  Musculoskeletal:        General: No edema.     Cervical back: Neck supple.  Neurological: He is alert and oriented to person, place, and time. He has intact cranial nerves (2-12).  Skin: Skin is warm and moist.     Impression & Recommendation(s):  Impression:   ICD-10-CM   1. Paroxysmal atrial fibrillation (HCC)  I48.0 EKG 12-Lead    Basic metabolic panel with GFR    Hemoglobin and hematocrit, blood    2. Long term (current) use of anticoagulants  Z79.01 EKG 12-Lead    Basic metabolic panel with GFR    Hemoglobin and hematocrit, blood    3. Hypertrophic cardiomyopathy (HCC), asymmetric septal hypertrophy  I42.2     4. Benign hypertension  I10        Recommendation(s):  Paroxysmal atrial fibrillation (HCC) Long term (current) use of anticoagulants Diagnosed in January/February 2023 via cardiac monitor. Rate control: Diltiazem . Rhythm control: N/A. Thromboembolic prophylaxis: Xarelto . Does not endorse evidence of bleeding.   No recent labs available for review; however, has labs scheduled with PCP in October as part of his yearly well visit.   Patient will send us  a copy of his labs for reference to make sure his creatinine clearance is appropriate for his current Xarelto  dose. Risks, benefits, alternatives to anticoagulation discussed with the patient at today's office visit. Will check H&H & BMP in 6 months prior to next office visit   Hypertrophic cardiomyopathy [facility]), asymmetric septal hypertrophy Diagnosed based on cardiac MRI March 2023. No evidence of obstructive physiology on echocardiogram. Continue diltiazem  120 mg p.o. daily. No recurrence of near-syncope or syncopal events. Monitor for now  Benign hypertension Office blood pressures are acceptable, slightly elevated secondary to URI symptoms Continue hydrochlorothiazide  12.5 mg  p.o. daily. Continue losartan  100 mg p.o. daily. Patient is advised to hold blood pressure medications on days when he feels lightheaded and dizzy to avoid episodes of near syncope/syncope and also to avoid exposure to extreme hot weather.   Orders Placed:  Orders Placed This Encounter  Procedures   Basic metabolic panel with GFR    Standing Status:   Future    Number of Occurrences:   1  Expected Date:   05/19/2024    Expiration Date:   11/19/2024   Hemoglobin and hematocrit, blood    Standing Status:   Future    Number of Occurrences:   1    Expected Date:   05/19/2024    Expiration Date:   11/19/2024   EKG 12-Lead   Final Medication List:   No orders of the defined types were placed in this encounter.   There are no discontinued medications.   Current Outpatient Medications:    ciclopirox  (PENLAC ) 8 % solution, Apply topically at bedtime. Apply over nail and surrounding skin. Apply daily over previous coat. After seven (7) days, may remove with alcohol and continue cycle., Disp: 6.6 mL, Rfl: 11   diltiazem  (TIAZAC ) 120 MG 24 hr capsule, TAKE 1 CAPSULE BY MOUTH EVERY MORNING, Disp: 90 capsule, Rfl: 1   ferrous sulfate 324 MG TBEC, Take 324 mg by mouth daily., Disp: , Rfl:    hydrochlorothiazide  (MICROZIDE ) 12.5 MG capsule, TAKE 1 CAPSULE BY MOUTH DAILY, Disp: 90 capsule, Rfl: 0   ketoconazole (NIZORAL) 2 % cream, SMARTSIG:1 Application Topical 1 to 2 Times Daily, Disp: , Rfl:    levETIRAcetam  (KEPPRA ) 500 MG tablet, TAKE 1 TABLET BY MOUTH TWICE A DAY, Disp: 180 tablet, Rfl: 0   losartan  (COZAAR ) 100 MG tablet, TAKE ONE TABLET BY MOUTH EVERY EVENING, Disp: 90 tablet, Rfl: 1   omeprazole  (PRILOSEC) 20 MG capsule, TAKE 1 CAPSULE BY MOUTH 2 TIMES A DAY, Disp: 180 capsule, Rfl: 0   traMADol  (ULTRAM ) 50 MG tablet, Take 50 mg by mouth every 6 (six) hours as needed., Disp: , Rfl:    XARELTO  20 MG TABS tablet, TAKE ONE TABLET BY MOUTH EVERY EVENING WITH DINNER, Disp: 90 tablet, Rfl: 1    zinc gluconate 50 MG tablet, Take 50 mg by mouth daily as needed., Disp: , Rfl:    Oxymetazoline HCl (NASAL SPRAY) 0.05 % SOLN, Place into the nose. (Patient not taking: Reported on 11/20/2023), Disp: , Rfl:   Consent:   N/A  Disposition:   6 months sooner if needed. Patient may be asked to follow-up sooner based on the results of the above-mentioned testing.  His questions and concerns were addressed to his satisfaction. He voices understanding of the recommendations provided during this encounter.    Signed, Madonna Michele HAS, Ms [facility]  A Division of [facility] [address] [place], [place] [address]   11/20/2023 5:04 PM

## 2023-11-20 NOTE — Patient Instructions (Addendum)
 Medication Instructions:   Not needed *If you need a refill on your cardiac medications before your next appointment, please call your pharmacy*   Lab Work: in 6 month  BMP Hgb and HCT    Please have your primary send a copy of labs if you have any in Oct 2025    If you have labs (blood work) drawn today and your tests are completely normal, you will receive your results only by: MyChart Message (if you have MyChart) OR A paper copy in the mail If you have any lab test that is abnormal or we need to change your treatment, we will call you to review the results.   Testing/Procedures: Not needed   Follow-Up: At [facility], you and your health needs are our priority.  As part of our continuing mission to provide you with exceptional heart care, we have created designated Provider Care Teams.  These Care Teams include your primary Cardiologist (physician) and Advanced Practice Providers (APPs -  Physician Assistants and Nurse Practitioners) who all work together to provide you with the care you need, when you need it.     Your next appointment:   6 month(s)  The format for your next appointment:   In Person  Provider:   Madonna Large, DO

## 2023-11-24 DIAGNOSIS — M5136 Other intervertebral disc degeneration, lumbar region with discogenic back pain only: Secondary | ICD-10-CM | POA: Diagnosis not present

## 2023-11-24 DIAGNOSIS — M9904 Segmental and somatic dysfunction of sacral region: Secondary | ICD-10-CM | POA: Diagnosis not present

## 2023-11-24 DIAGNOSIS — M9903 Segmental and somatic dysfunction of lumbar region: Secondary | ICD-10-CM | POA: Diagnosis not present

## 2023-11-24 DIAGNOSIS — M9905 Segmental and somatic dysfunction of pelvic region: Secondary | ICD-10-CM | POA: Diagnosis not present

## 2023-12-02 ENCOUNTER — Encounter: Payer: Self-pay | Admitting: Neurology

## 2023-12-02 ENCOUNTER — Ambulatory Visit: Payer: PPO | Admitting: Neurology

## 2023-12-02 VITALS — BP 134/55 | HR 66 | Ht 68.0 in | Wt 160.0 lb

## 2023-12-02 DIAGNOSIS — R4020 Unspecified coma: Secondary | ICD-10-CM

## 2023-12-02 DIAGNOSIS — M9904 Segmental and somatic dysfunction of sacral region: Secondary | ICD-10-CM | POA: Diagnosis not present

## 2023-12-02 DIAGNOSIS — M9903 Segmental and somatic dysfunction of lumbar region: Secondary | ICD-10-CM | POA: Diagnosis not present

## 2023-12-02 DIAGNOSIS — M9905 Segmental and somatic dysfunction of pelvic region: Secondary | ICD-10-CM | POA: Diagnosis not present

## 2023-12-02 DIAGNOSIS — M5136 Other intervertebral disc degeneration, lumbar region with discogenic back pain only: Secondary | ICD-10-CM | POA: Diagnosis not present

## 2023-12-02 MED ORDER — LEVETIRACETAM 500 MG PO TABS: 500.0000 mg | ORAL_TABLET | Freq: Two times a day (BID) | ORAL | 3 refills | Status: AC

## 2023-12-02 NOTE — Progress Notes (Signed)
 NEUROLOGY FOLLOW UP OFFICE NOTE  Drew Smith [id number] [birth year]  HISTORY OF PRESENT ILLNESS: I had the pleasure of seeing Drew Smith in follow-up in the neurology clinic on 12/16/2023.  The patient was last seen on 8 months ago for episodes of loss of consciousness. He is alone in the office today.  Records and images were personally reviewed where available. His 1-hour wake and sleep EEG was normal. He has had several cardiac studies and decided to hold off on brain MRI. He had a 14-day holter monitor with nocturnal pauses and one episode with pause and complete heart block along with intermittent paroxysmal atrial fibrillation. He was started on anticoagulation. He had a cardiac MRI consistent with asymmetrical septal hypertrophy, meeting criteria for HCM. Cardiac electrophysiology suspected his episodes of pauses/transient complete heart block was likely due to vagal medicated events and did not recommend pacemaker.   Since his last visit, he continues to do well with no episodes of loss of consciousness since January 2023. He is on a low dose of Levetiracetam  500mg  at bedtime, no side effects. He has rare episodes of a metallic taste in his mouth. He denies any staring/unresponsive episodes, gaps in time, focal numbness/tingling/weakness, myoclonic jerks. He has slight headaches, flashing lights bother him. No dizziness, vision changes, no falls. Sleep is good, he reports rare stress dreams where he wakes up sweating. He remains active playing golf and dancing.     History on Initial Assessment 03/06/2021: This is an 83 year old right-handed man with a history of hypertension, hyperlipidemia, prostate cancer, presenting for 2 episodes of loss of consciousness. He is accompanied by his son Drew Smith who helps supplement the history today. The first event occurred on 11/16/20 while at [facility] and he was brought to [facility]. He was attending a dance festival for several days,  he danced earlier that day and recalls having breakfast, went to a mid-day club party (did not drink alcohol), went back to his room to rest for a couple of hours. He recalls laying on the bed then waking up to people around him yelling, unable to understand them. Drew Smith reports he heard a noise and found his father moaning and groaning, he was not responding with eyes opening and closing almost like having a bad dream. This lasted 5 minutes, he woke up and did not realize what was going on, it took about 10 minutes before he could answer orientation questions. No focal weakness, tongue bite or incontinence. Per notes, he was feverish, diaphoretic and EMS reported O2 sats in the 80s. Mental status improved en route to hospital. CT chest showed diffuse ground-glass in the right lung that can be seen in atypical particularly viral infections, WBC 13,  he was treated with Rocephin and Zmax  and mental status returned to normal very rapidly. The next event occurred on 03/02/2021, again after a day of activity playing golf. He ate breakfast and had snacks, went home to clean up. He recalls feeling great when he lay down for a nap at 4pm, then woke up face down on the floor curled up in blankets around 6pm. He had difficulty getting up initially but managed to stand feeling woozy. His other son called and came, he felt fine but had a large abrasion on the bridge of his nose and bit his tongue, so they decided to go to the ER. Bloodwork showed WBC 11.1, Na 133. UDS, EtOH negative. I personally reviewed head CT without contrast which did not show  any acute changes. Symptoms felt concerning for seizure so he was discharged home on Levetiracetam  500mg  BID which makes him a little drowsy.  He has had brief body jerks in the past 3-5 years. He and his son deny any staring/unresponsive episodes. He denies any olfactory/gustatory hallucinations, deja vu, rising epigastric sensation, focal numbness/tingling/weakness. He has a  little problem with balance and does balance exercises regularly. He denies any headaches, diplopia, dysarthria/dysphagia, neck pain, bowel/bladder dysfunction. His back bothers him some. He feels his memory is pretty good for his age, he makes notes all the time. He denies missing medications, bill payments, or getting lost driving. 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 He had a normal birth and early development.  There is no history of febrile convulsions, CNS infections such as meningitis/encephalitis, significant traumatic brain injury, or family history of seizures. He drinks about 1.5 glasses of alcohol daily and recalls having a little more during the dance festival in 10/2020 and one beer after his golf game on 03/02/21. His son notes a fall 2 weeks ago after he had 4 drinks, he fell getting out of the car in the dark, no loss of consciousness. He reports high anxiety with very infrequent palpitations, no chest pain or shortness of breath.   Diagnostic Data: Head CT 02/2021 normal 1-hour wake and sleep EEG in 03/2021 normal.  PAST MEDICAL HISTORY: Past Medical History:  Diagnosis Date   Arthritis    Asthma    Cancer (HCC)    prostate   GERD (gastroesophageal reflux disease)    Guillain-Barre syndrome 02/25/1998   Hyperlipidemia    Hypertension    Seizures (HCC)     MEDICATIONS: Current Outpatient Medications on File Prior to Visit  Medication Sig Dispense Refill   diltiazem  (TIAZAC ) 120 MG 24 hr capsule TAKE 1 CAPSULE BY MOUTH EVERY MORNING 90 capsule 1   ferrous sulfate 324 MG TBEC Take 324 mg by mouth daily.     hydrochlorothiazide  (MICROZIDE ) 12.5 MG capsule TAKE 1 CAPSULE BY MOUTH DAILY 90 capsule 0   ketoconazole (NIZORAL) 2 % cream SMARTSIG:1 Application Topical 1 to 2 Times Daily     losartan  (COZAAR ) 100 MG tablet TAKE ONE TABLET BY MOUTH EVERY EVENING 90 tablet 1   omeprazole  (PRILOSEC) 20 MG capsule TAKE 1  CAPSULE BY MOUTH 2 TIMES A DAY 180 capsule 0   Oxymetazoline HCl (NASAL SPRAY) 0.05 % SOLN Place into the nose.     traMADol  (ULTRAM ) 50 MG tablet Take 50 mg by mouth every 6 (six) hours as needed.     XARELTO  20 MG TABS tablet TAKE ONE TABLET BY MOUTH EVERY EVENING WITH DINNER 90 tablet 1   zinc gluconate 50 MG tablet Take 50 mg by mouth daily as needed.     No current facility-administered medications on file prior to visit.    ALLERGIES: Allergies  Allergen Reactions   Flublok [Influenza Vaccine Recombinant]    Other     Shingles vaccine    FAMILY HISTORY: Family History  Problem Relation Age of Onset   Alcohol abuse Father    Hyperlipidemia Brother    Stroke Brother    Diabetes Brother        type ll   Bladder Cancer Brother    Diabetes Brother     SOCIAL HISTORY: Social History   Socioeconomic History   Marital status: Divorced    Spouse name: Not on file   Number of  children: 3   Years of education: Not on file   Highest education level: Some college, no degree  Occupational History   Occupation: retired  Tobacco Use   Smoking status: Never   Smokeless tobacco: Never  Vaping Use   Vaping status: Never Used  Substance and Sexual Activity   Alcohol use: Yes    Alcohol/week: 5.0 standard drinks of alcohol    Types: 5 Standard drinks or equivalent per week    Comment: quit drinking wine, now drinks 1 liquor drink a night   Drug use: No   Sexual activity: Not Currently  Other Topics Concern   Not on file  Social History Narrative   Lives alone in one level. Has two sons 1 daughter and friends who serve as support.   Former Psychologist, prison and probation services; Enjoys Mudlogger, reading   Originally from [place]   Right handed    Caffeine 1 cup daily   Social Drivers of [employer] Strain: Low Risk  (07/09/2023)   Overall Financial Resource Strain (CARDIA)    Difficulty of Paying Living Expenses: Not hard at all  Food Insecurity: No Food Insecurity  (07/09/2023)   Hunger Vital Sign    Worried About Running Out of Food in the Last Year: Never true    Ran Out of Food in the Last Year: Never true  Transportation Needs: No Transportation Needs (07/09/2023)   PRAPARE - Administrator, Civil Service (Medical): No    Lack of Transportation (Non-Medical): No  Physical Activity: Sufficiently Active (07/09/2023)   Exercise Vital Sign    Days of Exercise per Week: 7 days    Minutes of Exercise per Session: 30 min  Stress: No Stress Concern Present (07/09/2023)   [employer] of Occupational Health - Occupational Stress Questionnaire    Feeling of Stress : Not at all  Social Connections: Moderately Integrated (07/09/2023)   Social Connection and Isolation Panel    Frequency of Communication with Friends and Family: More than three times a week    Frequency of Social Gatherings with Friends and Family: More than three times a week    Attends Religious Services: More than 4 times per year    Active Member of [employer] or Organizations: Yes    Attends Engineer, structural: More than 4 times per year    Marital Status: Divorced  Intimate Partner Violence: Not At Risk (07/09/2023)   Humiliation, Afraid, Rape, and Kick questionnaire    Fear of Current or Ex-Partner: No    Emotionally Abused: No    Physically Abused: No    Sexually Abused: No     PHYSICAL EXAM: Vitals:   12/02/23 1354  BP: (!) 134/55  Pulse: 66  SpO2: 99%   General: No acute distress Head:  Normocephalic/atraumatic Skin/Extremities: No rash, no edema Neurological Exam: alert and awake. No aphasia or dysarthria. Fund of knowledge is appropriate.  Attention and concentration are normal.   Cranial nerves: Pupils equal, round. Extraocular movements intact with no nystagmus. Visual fields full.  No facial asymmetry.  Motor: Bulk and tone normal, muscle strength 5/5 throughout with no pronator drift.   Finger to nose testing intact.  Gait slow and cautious, no  ataxia.    IMPRESSION: This is a pleasant 83 yo RH man with a history of hypertension, hyperlipidemia, prostate cancer, with 2 episodes of loss of consciousness that occurred in 10/2020 and 03/02/2021. Both occurred while taking a nap after physical activity. He was  started on Levetiracetam  in the ER after the second event. His 1-hour EEG is normal. He has had a cardiac workup, with nocturnal pauses and one episode with pause and complete heart block along with intermittent paroxysmal atrial fibrillation, currently on Xarelto . Cardiac electrophysiology suspected his episodes of pauses/transient complete heart block was likely due to vagal medicated events and did not recommend pacemaker. He has been event-free since 02/2021, after discussion of risks and benefits, we have agreed to continue low dose Levetiracetam  500mg  at bedtime.  He is aware of [place] driving laws to stop driving after an episode of loss of consciousness until 6 months event-free. Follow-up in 8 months, call for any changes.    Thank you for allowing me to participate in his care.  Please do not hesitate to call for any questions or concerns.    Darice Shivers, M.D.   CC: Dr. Micheal

## 2023-12-02 NOTE — Patient Instructions (Addendum)
 It's always a pleasure to see you. Continue Keppra  XR 500mg  every night. Follow-up in 8 months, call for any changes.    Seizure Precautions: 1. If medication has been prescribed for you to prevent seizures, take it exactly as directed.  Do not stop taking the medicine without talking to your doctor first, even if you have not had a seizure in a long time.   2. Avoid activities in which a seizure would cause danger to yourself or to others.  Don't operate dangerous machinery, swim alone, or climb in high or dangerous places, such as on ladders, roofs, or girders.  Do not drive unless your doctor says you may.  3. If you have any warning that you may have a seizure, lay down in a safe place where you can't hurt yourself.    4.  No driving for 6 months from last seizure, as per [place]  state law.   Please refer to the following link on the Epilepsy Foundation of America's website for more information: http://www.epilepsyfoundation.org/answerplace/Social/driving/drivingu.cfm   5.  Maintain good sleep hygiene. Avoid alcohol.  6.  Contact your doctor if you have any problems that may be related to the medicine you are taking.  7.  Call 911 and bring the patient back to the ED if:        A.  The seizure lasts longer than 5 minutes.       B.  The patient doesn't awaken shortly after the seizure  C.  The patient has new problems such as difficulty seeing, speaking or moving  D.  The patient was injured during the seizure  E.  The patient has a temperature over 102 F (39C)  F.  The patient vomited and now is having trouble breathing

## 2023-12-23 ENCOUNTER — Telehealth: Payer: Self-pay

## 2023-12-23 NOTE — Telephone Encounter (Signed)
 Copied from CRM [id number]. Topic: Clinical - Prescription Issue >> Dec 23, 2023  4:54 PM Wess RAMAN wrote: Reason for CRM: Patient would like to get Triamcinolone  cream 0.12% refilled. Medication is not listed on current list of medication  Callback #: [phone] or [phone]  Pharmacy: [facility] [id number] - [place], [place] - [address] [place] [address] Phone: [phone] Fax: [phone] Hours: Not open 24 hours

## 2023-12-24 MED ORDER — TRIAMCINOLONE ACETONIDE 0.1 % EX CREA: TOPICAL_CREAM | CUTANEOUS | 2 refills | Status: AC

## 2023-12-24 NOTE — Addendum Note (Signed)
 Addended by: METTA KRISTEN CROME on: 12/24/2023 07:03 AM   Modules accepted: Orders

## 2024-01-05 ENCOUNTER — Ambulatory Visit (INDEPENDENT_AMBULATORY_CARE_PROVIDER_SITE_OTHER): Admitting: Family Medicine

## 2024-01-05 ENCOUNTER — Encounter: Payer: Self-pay | Admitting: Family Medicine

## 2024-01-05 ENCOUNTER — Ambulatory Visit: Payer: Self-pay

## 2024-01-05 VITALS — BP 138/72 | HR 56 | Temp 97.9°F | Resp 16 | Ht 68.0 in | Wt 162.4 lb

## 2024-01-05 DIAGNOSIS — M545 Low back pain, unspecified: Secondary | ICD-10-CM | POA: Diagnosis not present

## 2024-01-05 DIAGNOSIS — L821 Other seborrheic keratosis: Secondary | ICD-10-CM | POA: Diagnosis not present

## 2024-01-05 DIAGNOSIS — M9904 Segmental and somatic dysfunction of sacral region: Secondary | ICD-10-CM | POA: Diagnosis not present

## 2024-01-05 DIAGNOSIS — M9903 Segmental and somatic dysfunction of lumbar region: Secondary | ICD-10-CM | POA: Diagnosis not present

## 2024-01-05 DIAGNOSIS — M5136 Other intervertebral disc degeneration, lumbar region with discogenic back pain only: Secondary | ICD-10-CM | POA: Diagnosis not present

## 2024-01-05 DIAGNOSIS — M9905 Segmental and somatic dysfunction of pelvic region: Secondary | ICD-10-CM | POA: Diagnosis not present

## 2024-01-05 MED ORDER — TIZANIDINE HCL 4 MG PO TABS: 2.0000 mg | ORAL_TABLET | Freq: Every day | ORAL | 0 refills | Status: AC

## 2024-01-05 NOTE — Telephone Encounter (Signed)
 FYI Only or Action Required?: FYI only for provider: appointment scheduled on 01/05/24.  Patient was last seen in primary care on 11/19/2023 by Theophilus Andrews, Tully GRADE, MD.  Called Nurse Triage reporting Hip Pain.  Symptoms began a week ago.  Interventions attempted: OTC medications: tylenol.  Symptoms are: gradually worsening.  Triage Disposition: See PCP When Office is Open (Within 3 Days)  Patient/caregiver understands and will follow disposition?: Yes Reason for Disposition  [1] MODERATE pain (e.g., interferes with normal activities, limping) AND [2] present > 3 days  Answer Assessment - Initial Assessment Questions Pt reports right hip pain. States possibly r/t golfing.  1. LOCATION and RADIATION: Where is the pain located? Does the pain spread (shoot) anywhere else?     Right hip  2. QUALITY: What does the pain feel like?  (e.g., sharp, dull, aching, burning)     Sharp  3. SEVERITY: How bad is the pain? What does it keep you from doing?   (Scale 1-10; or mild, moderate, severe)     Becoming 8-9/10, noticeable when bending or golfing.  4. ONSET: When did the pain start? Does it come and go, or is it there all the time?     One week  5. WORK OR EXERCISE: Has there been any recent work or exercise that involved this part of the body?      Golf  Protocols used: Hip Pain-A-AH Copied from [employer]SPAN [id number]. Topic: Clinical - Red Word Triage >> Jan 05, 2024  8:14 AM Zy'onna H wrote: Red Word that prompted transfer to Nurse Triage: Patient is having the following issues:  Right hip pain (general pain, most noticeable when hinging the hip or bending) Face and Neck spots (Red)   Transf. To NT

## 2024-01-05 NOTE — Patient Instructions (Addendum)
 A few things to remember from today's visit:  Right low back pain, unspecified chronicity, unspecified whether sciatica present - Plan: tiZANidine (ZANAFLEX) 4 MG tablet  Seborrheic keratosis  Try Tylenol 500 mg 2 tabs before playing golf. Keep appt with PT. Muscle relaxant at bedtime for 1-2 weeks, it causes drowsiness.  Please arrange appt with your dermatologist.  If you need refills for medications you take chronically, please call your pharmacy. Do not use My Chart to request refills or for acute issues that need immediate attention. If you send a my chart message, it may take a few days to be addressed, specially if I am not in the office.  Please be sure medication list is accurate. If a new problem present, please set up appointment sooner than planned today.

## 2024-01-05 NOTE — Progress Notes (Signed)
 ACUTE VISIT Chief Complaint  Patient presents with   Acute Visit    Hip pain    HPI: Drew Smith is a 83 y.o. male, who is here today complaining of *** HPI Discussed the use of AI scribe software for clinical note transcription with the patient, who gave verbal consent to proceed.  History of Present Illness Drew Smith is an 83 year old male with a history of severe arthrosis who presents with worsening hip pain.  He has been experiencing intermittent hip pain for a long time, which has become significantly bothersome over the past week. The pain is primarily located in the back and occasionally radiates slightly to the back. Pain intensity ranges from 2-3 out of 10 at rest to 8-9 out of 10 during activities like reaching awkwardly or bending. It causes some limitation in walking and movement but does not significantly interfere with sleep. He attributes the aggravation of the pain to frequent golf playing.  He had a lower back MRI ordered in 2022 due to back and hip pain. A left hip X-ray was performed in 2021. He has not seen an orthopedist but has been seeing a chiropractor, which provides temporary relief. He is currently undergoing physical therapy for his back and hip.  He experiences occasional numbness and tingling in his feet, which he sometimes associates with his heart condition. However, he is uncertain if it is related to his current hip pain. No recent injury, fever, or abdominal pain.  His current medications include Keppra  and Xarelto . He has been using Tylenol for pain management and notes that it sometimes causes tinnitus. He avoids ibuprofen  and naproxen due to his heart condition.  He also reports having skin lesions that have been present for a long time, for which he has used various creams. Some lesions were previously frozen but did not resolve. He is concerned about the appearance of these lesions, particularly the red ones.   Review of  Systems See other pertinent positives and negatives in HPI.  Current Outpatient Medications on File Prior to Visit  Medication Sig Dispense Refill   diltiazem  (TIAZAC ) 120 MG 24 hr capsule TAKE 1 CAPSULE BY MOUTH EVERY MORNING 90 capsule 1   ferrous sulfate 324 MG TBEC Take 324 mg by mouth daily.     hydrochlorothiazide  (MICROZIDE ) 12.5 MG capsule TAKE 1 CAPSULE BY MOUTH DAILY 90 capsule 0   ketoconazole (NIZORAL) 2 % cream SMARTSIG:1 Application Topical 1 to 2 Times Daily     levETIRAcetam  (KEPPRA ) 500 MG tablet Take 1 tablet (500 mg total) by mouth 2 (two) times daily. 180 tablet 3   losartan  (COZAAR ) 100 MG tablet TAKE ONE TABLET BY MOUTH EVERY EVENING 90 tablet 1   omeprazole  (PRILOSEC) 20 MG capsule TAKE 1 CAPSULE BY MOUTH 2 TIMES A DAY 180 capsule 0   Oxymetazoline HCl (NASAL SPRAY) 0.05 % SOLN Place into the nose.     traMADol  (ULTRAM ) 50 MG tablet Take 50 mg by mouth every 6 (six) hours as needed.     triamcinolone  cream (KENALOG ) 0.1 % Apply twice daily as needed 30 g 2   XARELTO  20 MG TABS tablet TAKE ONE TABLET BY MOUTH EVERY EVENING WITH DINNER 90 tablet 1   zinc gluconate 50 MG tablet Take 50 mg by mouth daily as needed.     No current facility-administered medications on file prior to visit.    Past Medical History:  Diagnosis Date   Arthritis    Asthma  Cancer (HCC)    prostate   GERD (gastroesophageal reflux disease)    Guillain-Barre syndrome 02/25/1998   Hyperlipidemia    Hypertension    Seizures (HCC)    Allergies  Allergen Reactions   Flublok [Influenza Vaccine Recombinant]    Other     Shingles vaccine    Social History   Socioeconomic History   Marital status: Divorced    Spouse name: Not on file   Number of children: 3   Years of education: Not on file   Highest education level: Some college, no degree  Occupational History   Occupation: retired  Tobacco Use   Smoking status: Never   Smokeless tobacco: Never  Vaping Use   Vaping status:  Never Used  Substance and Sexual Activity   Alcohol use: Yes    Alcohol/week: 5.0 standard drinks of alcohol    Types: 5 Standard drinks or equivalent per week    Comment: quit drinking wine, now drinks 1 liquor drink a night   Drug use: No   Sexual activity: Not Currently  Other Topics Concern   Not on file  Social History Narrative   Lives alone in one level. Has two sons 1 daughter and friends who serve as support.   Former psychologist, prison and probation services; Enjoys mudlogger, reading   Originally from [place]   Right handed    Caffeine 1 cup daily   Social Drivers of [employer] Strain: Low Risk  (07/09/2023)   Overall Financial Resource Strain (CARDIA)    Difficulty of Paying Living Expenses: Not hard at all  Food Insecurity: No Food Insecurity (07/09/2023)   Hunger Vital Sign    Worried About Running Out of Food in the Last Year: Never true    Ran Out of Food in the Last Year: Never true  Transportation Needs: No Transportation Needs (07/09/2023)   PRAPARE - Administrator, Civil Service (Medical): No    Lack of Transportation (Non-Medical): No  Physical Activity: Sufficiently Active (07/09/2023)   Exercise Vital Sign    Days of Exercise per Week: 7 days    Minutes of Exercise per Session: 30 min  Stress: No Stress Concern Present (07/09/2023)   [employer] of Occupational Health - Occupational Stress Questionnaire    Feeling of Stress : Not at all  Social Connections: Moderately Integrated (07/09/2023)   Social Connection and Isolation Panel    Frequency of Communication with Friends and Family: More than three times a week    Frequency of Social Gatherings with Friends and Family: More than three times a week    Attends Religious Services: More than 4 times per year    Active Member of [employer] or Organizations: Yes    Attends Banker Meetings: More than 4 times per year    Marital Status: Divorced    Vitals:   01/05/24 1328  BP: 138/72   Pulse: (!) 56  Resp: 16  Temp: 97.9 F (36.6 C)  SpO2: 96%   Body mass index is 24.69 kg/m.  Physical Exam Vitals and nursing note reviewed.  Constitutional:      General: He is not in acute distress.    Appearance: He is well-developed.  HENT:     Head: Normocephalic and atraumatic.  Eyes:     Conjunctiva/sclera: Conjunctivae normal.  Cardiovascular:     Rate and Rhythm: Regular rhythm. Bradycardia present.     Pulses:          Dorsalis  pedis pulses are 2+ on the right side and 2+ on the left side.     Heart sounds: No murmur heard.    Comments: Trace pitting LE edema, bilateral. Pulmonary:     Effort: Pulmonary effort is normal. No respiratory distress.     Breath sounds: Normal breath sounds.  Abdominal:     Palpations: Abdomen is soft. There is no hepatomegaly or mass.     Tenderness: There is no abdominal tenderness.  Musculoskeletal:     Lumbar back: Tenderness present. Negative right straight leg raise test and negative left straight leg raise test.     Comments: Pain elicited with movement on exam table during examination.  Skin:    General: Skin is warm.     Findings: No erythema.         Comments: Slightly raised erythematous lesions on forehead and hyperpigmented rounded, defined borders scattered on face.  Neurological:     Mental Status: He is alert and oriented to person, place, and time.     Deep Tendon Reflexes:     Reflex Scores:      Patellar reflexes are 2+ on the right side and 2+ on the left side.    Comments: Antalgic gait, not assisted.  Psychiatric:     Comments: Well groomed,good eye contact.     ASSESSMENT AND PLAN: There are no diagnoses linked to this encounter.  No follow-ups on file.  Nattalie Santiesteban G. Jariah Tarkowski, MD  [facility]. [place] office.  Discharge Instructions   None

## 2024-01-06 ENCOUNTER — Telehealth: Payer: Self-pay | Admitting: Podiatry

## 2024-01-06 NOTE — Telephone Encounter (Signed)
 Patient states that his pharmacy has sent over a request for a script. I informed him that I do not see a request in the chart. He is requesting a cream for his foot. The name starts with the letter c.I mentioned that I don't see this medication in his chart. Please advice.

## 2024-01-06 NOTE — Telephone Encounter (Signed)
 Will do. The medication is clotrimazole.

## 2024-01-07 ENCOUNTER — Ambulatory Visit: Payer: Self-pay

## 2024-01-07 ENCOUNTER — Ambulatory Visit: Attending: Family Medicine | Admitting: Physical Therapy

## 2024-01-07 ENCOUNTER — Ambulatory Visit (INDEPENDENT_AMBULATORY_CARE_PROVIDER_SITE_OTHER): Admitting: Family Medicine

## 2024-01-07 ENCOUNTER — Encounter: Payer: Self-pay | Admitting: Physical Therapy

## 2024-01-07 ENCOUNTER — Encounter: Payer: Self-pay | Admitting: Family Medicine

## 2024-01-07 ENCOUNTER — Other Ambulatory Visit: Payer: Self-pay

## 2024-01-07 VITALS — BP 138/60 | HR 63 | Temp 98.3°F | Wt 162.5 lb

## 2024-01-07 DIAGNOSIS — M9905 Segmental and somatic dysfunction of pelvic region: Secondary | ICD-10-CM | POA: Diagnosis not present

## 2024-01-07 DIAGNOSIS — Z7409 Other reduced mobility: Secondary | ICD-10-CM | POA: Diagnosis not present

## 2024-01-07 DIAGNOSIS — M545 Low back pain, unspecified: Secondary | ICD-10-CM

## 2024-01-07 DIAGNOSIS — R269 Unspecified abnormalities of gait and mobility: Secondary | ICD-10-CM | POA: Insufficient documentation

## 2024-01-07 DIAGNOSIS — R262 Difficulty in walking, not elsewhere classified: Secondary | ICD-10-CM | POA: Insufficient documentation

## 2024-01-07 DIAGNOSIS — M9903 Segmental and somatic dysfunction of lumbar region: Secondary | ICD-10-CM | POA: Diagnosis not present

## 2024-01-07 DIAGNOSIS — M5136 Other intervertebral disc degeneration, lumbar region with discogenic back pain only: Secondary | ICD-10-CM | POA: Diagnosis not present

## 2024-01-07 DIAGNOSIS — R252 Cramp and spasm: Secondary | ICD-10-CM | POA: Insufficient documentation

## 2024-01-07 DIAGNOSIS — M9904 Segmental and somatic dysfunction of sacral region: Secondary | ICD-10-CM | POA: Diagnosis not present

## 2024-01-07 DIAGNOSIS — M5459 Other low back pain: Secondary | ICD-10-CM | POA: Diagnosis not present

## 2024-01-07 NOTE — Therapy (Signed)
 OUTPATIENT PHYSICAL THERAPY THORACOLUMBAR EVALUATION   Patient Name: Drew Smith MRN: [id number] DOB:08/24/1940, 83 y.o., male Today's Date: 01/07/2024  END OF SESSION:  PT End of Session - 01/07/24 1536     Visit Number 1    Date for Recertification  03/03/24    Authorization Type HTA - no auth reqd; med nec    Progress Note Due on Visit 10    PT Start Time 1536    PT Stop Time 1627    PT Time Calculation (min) 51 min    Activity Tolerance Patient tolerated treatment well;Patient limited by pain    Behavior During Therapy WFL for tasks assessed/performed          Past Medical History:  Diagnosis Date   Arthritis    Asthma    Cancer (HCC)    prostate   GERD (gastroesophageal reflux disease)    Guillain-Barre syndrome 02/25/1998   Hyperlipidemia    Hypertension    Seizures (HCC)    Past Surgical History:  Procedure Laterality Date   CATARACT EXTRACTION     EYE SURGERY  02/26/1943   83 years old   HERNIA REPAIR  02/25/1993   NOSE SURGERY  02/26/1988   PROSTATE SURGERY  02/25/1998   seed inplants   SHOULDER SURGERY  02/25/2006   Patient Active Problem List   Diagnosis Date Noted   Hypertrophic cardiomyopathy (HCC) 04/01/2023   Actinic keratosis 01/02/2022   Hypokalemia 05/06/2021   CHB (complete heart block) (HCC) 05/05/2021   Syncope 05/04/2021   Paroxysmal atrial fibrillation (HCC) 05/04/2021   History of seizures 05/04/2021   Malignant melanoma of left forearm (HCC) 10/19/2019   Upper respiratory tract infection 02/14/2017   Porokeratosis 09/07/2014   Metatarsal deformity 09/07/2014   Pain in lower limb 09/07/2014   Squamous cell carcinoma in situ of skin 10/15/2013   Hives 10/06/2012   History of Guillain-Barre syndrome 03/07/2011   Hypertension 11/28/2010   Dyslipidemia 11/28/2010   ERECTILE DYSFUNCTION 01/05/2008   Asthma 10/28/2007   GERD 10/28/2007   DIZZINESS 10/28/2007   COUGH 10/28/2007   FASTING HYPERGLYCEMIA 10/28/2007   PROSTATE  CANCER, HX OF 10/28/2007    PCP: Micheal Wolm ORN, MD   REFERRING PROVIDER: Micheal Wolm ORN, MD   REFERRING DIAG: M54.50 (ICD-10-CM) - Lumbar pain   Rationale for Evaluation and Treatment: Rehabilitation  THERAPY DIAG:  Other low back pain  Cramp and spasm  ONSET DATE: 2 weeks ago  SUBJECTIVE:  SUBJECTIVE STATEMENT: Plays a lot of golf. I try to do a lot of exercises and stretch. Sees a chiropractor, saw Monday and today and it helped. It was a lot of stretching.  I can't sleep or walk right. I went to [facility] on Monday and they were very aggressive with me. Yesterday I was in a lot of pain. A little better today, but still bad.  PERTINENT HISTORY:   Fatty heart, OA, HTN, seizures (been over a year - takes meds), prostate CA, leg cramps  PAIN:  Are you having pain? Yes: NPRS scale: 2-3/10 up to 9/10 Pain location: low back right side shoots down right leg to foot and foot feels numb Pain description: dull sitting, sharp when standing Aggravating factors: sit to stand,  standing and walking Relieving factors: Tylenol  PRECAUTIONS: Other: SEIZURES - NO ESTIM  RED FLAGS: None   WEIGHT BEARING RESTRICTIONS: No  FALLS:  Has patient fallen in last 6 months? No  LIVING ENVIRONMENT: Lives with: lives alone Lives in: House/apartment Stairs: Yes: External: 2 steps; none Has following equipment at home: None  OCCUPATION: retired  PLOF: Independent  PATIENT GOALS: back to golf  NEXT MD VISIT: one week  OBJECTIVE:  Note: Objective measures were completed at Evaluation unless otherwise noted.  DIAGNOSTIC FINDINGS:  2022  MRI Lumbar 1. Severe L4-5 facet arthrosis with grade 1 anterolisthesis and severe spinal stenosis. 2. Mild spinal stenosis and mild-to-moderate lateral recess  stenosis at L3-4.  PATIENT SURVEYS:  Modified Oswestry:  MODIFIED OSWESTRY DISABILITY SCALE  Date: 01/07/24 Score  Pain intensity 3 =  Pain medication provides me with moderate relief from pain.  2. Personal care (washing, dressing, etc.) 2 =  It is painful to take care of myself, and I am slow and careful.  3. Lifting 2 = Pain prevents me from lifting heavy weights off the floor,but I can manage if the weights are conveniently positioned (e.g. on a table)  4. Walking 3 =  Pain prevents me from walking more than  mile.  5. Sitting 2 =  Pain prevents me from sitting more than 1 hour.  6. Standing 3 =  Pain prevents me from standing more than 1/2 hour.  7. Sleeping 3 =  Even when I take pain medication, I sleep less than 4 hours.  8. Social Life 2 = Pain prevents me from participating in more energetic activities (eg. sports, dancing).  9. Traveling 2 =  My pain restricts my travel over 2 hours.  10. Employment/ Homemaking 2 = I can perform most of my homemaking/job duties, but pain prevents me from performing more physically stressful activities (eg, lifting, vacuuming).  Total 24/50   Interpretation of scores: Score Category Description  0-20% Minimal Disability The patient can cope with most living activities. Usually no treatment is indicated apart from advice on lifting, sitting and exercise  21-40% Moderate Disability The patient experiences more pain and difficulty with sitting, lifting and standing. Travel and social life are more difficult and they may be disabled from work. Personal care, sexual activity and sleeping are not grossly affected, and the patient can usually be managed by conservative means  41-60% Severe Disability Pain remains the main problem in this group, but activities of daily living are affected. These patients require a detailed investigation  61-80% Crippled Back pain impinges on all aspects of the patient's life. Positive intervention is required  81-100%  Bed-bound These patients are either bed-bound or exaggerating their symptoms  Bluford  E, Scantlebury A, Booth A, et al. Surgery versus conservative management of stable thoracolumbar fracture: the PRESTO feasibility RCT. [place] ([place]): [employer]; 2021 Nov. [facility] Technology Assessment, No. 25.62.) Appendix 3, Oswestry Disability Index category descriptors. Available from: Findjewelers.cz  Minimally Clinically Important Difference (MCID) = 12.8%  COGNITION: Overall cognitive status: Within functional limits for tasks assessed     SENSATION: Tingling in both feet x 1-2 yrs  MUSCLE LENGTH:   POSTURE: rounded shoulders, forward head, decreased lumbar lordosis, increased thoracic kyphosis, posterior pelvic tilt, and flexed trunk   PALPATION: R lower lumbar and near PSIS  LUMBAR ROM: * pain  AROM eval  Flexion 25%*  Extension 25%  Right lateral flexion WFL*  Left lateral flexion WFL  Right rotation 50%  Left rotation 50%   (Blank rows = not tested)  LOWER EXTREMITY ROM:   WFL for tasks assessed  LOWER EXTREMITY MMT:  *pain with resisted R hip flexion Grossly 4+/5 to 5/5 in B hips, knees and ankle DF   LUMBAR SPECIAL TESTS:  Straight leg raise test: Positive and Quadrant test: Positive  FUNCTIONAL TESTS:  5 times sit to stand: 32.86 sec 3-5/10 pain  GAIT: Distance walked: 20 Comments: antalgic, decreased stance time R  TREATMENT DATE:                                                                                                                               01/07/24   See pt ed and HEP    PATIENT EDUCATION:  Education details: PT eval findings, anticipated POC, initial HEP, HEP review, postural awareness, posture and body mechanics for typical daily postioning, mobility and household tasks, role of TPDN, fee schedule for TPDN and lack of insurance coverage requiring payment at time of service, and discussion of limiting twisting  exercises and any exercise that causes increased pain at this time, discussion of his seizures   Person educated: Patient Education method: Explanation, Demonstration, Tactile cues, Verbal cues, and Handouts Education comprehension: verbalized understanding and returned demonstration  HOME EXERCISE PROGRAM: Access Code: [email or web address] URL: [email or web address] Date: 01/07/2024 Prepared by: Mliss  Exercises - Supine Bridge  - 2 x daily - 7 x weekly - 1-3 sets - 10 reps - Supine Lower Trunk Rotation  - 2 x daily - 7 x weekly - 1 sets - 5 reps - 10 sec hold - Seated Long Arc Quad  - 1 x daily - 7 x weekly - 2 sets - 10 reps - 5 sec hold - Seated Figure 4 Piriformis Stretch  - 2 x daily - 7 x weekly - 1 sets - 3 reps - 30-60 sec hold - Supine Figure 4 Piriformis Stretch  - 1 x daily - 7 x weekly - 1 sets - 3 reps - 30-60 sec hold  ASSESSMENT:  CLINICAL IMPRESSION: Patient is a 83 y.o. male who was seen today for physical therapy evaluation and treatment for acute LBP starting 2  weeks ago. He reports marked pain with sit to stand transfers, bed mobility, sitting, standing and walking. His pain affects all ADLs including sleep. He also cannot golf which is his main goal. He has pain with lumbar flexion and R SB, pain with resisted hip flexion. He has pain with B SLR. He has a h/o of severe L4/5 facet arthrosis and spinal stenosis. He is very active and would like to return to his PLOF. Pt will benefit from skilled PT to address these deficits and those listed below. He has a history of seizures and is on med to control these. He has not had one in over a year, but has a denture which would need to be removed if a seizure occurs.   OBJECTIVE IMPAIRMENTS: Abnormal gait, decreased activity tolerance, decreased mobility, difficulty walking, decreased ROM, decreased strength, dizziness, increased muscle spasms, impaired flexibility, postural dysfunction, and pain.   ACTIVITY LIMITATIONS:  carrying, lifting, bending, sitting, standing, squatting, sleeping, stairs, transfers, bed mobility, bathing, dressing, hygiene/grooming, and locomotion level  PARTICIPATION LIMITATIONS: meal prep, cleaning, laundry, driving, shopping, community activity, yard work, and golf  PERSONAL FACTORS: Age and 3+ comorbidities: OA, leg cramps, heart issues are also affecting patient's functional outcome.   REHAB POTENTIAL: Good  CLINICAL DECISION MAKING: Evolving/moderate complexity  EVALUATION COMPLEXITY: Moderate   GOALS: Goals reviewed with patient? Yes  SHORT TERM GOALS: Target date: 02/04/2024   Patient will be independent with initial HEP.  Baseline:  Goal status: INITIAL  2.  Patient will report centralization of radicular symptoms.  Baseline:  Goal status: INITIAL  3.  Decreased back pain by 30% to improve QOL. Baseline:  Goal status: INITIAL  4.  Patient able to sleep without waking from back pain Baseline:  Goal status: INITIAL    LONG TERM GOALS: Target date: 03/03/2024   Patient will be independent with advanced/ongoing HEP to improve outcomes and carryover.  Baseline:  Goal status: INITIAL  72.  Patient will report 75% improvement in low back pain with ADLs to improve QOL.  Baseline:  Goal status: INITIAL  3.  Patient will demonstrate functional lumbar flexion to perform ADLs and return to golf   Baseline:  Goal status: INITIAL  4.  Patient will be able to perform 5xSTS in less than 20 seconds showing functional improvement in strength without increased pain Baseline:  Goal status: INITIAL  5.  Patient will 18 or less on the Modified Oswestry demonstrating improved functional ability.  Baseline: 24 Goal status: INITIAL    PLAN:  PT FREQUENCY: 2x/week  PT DURATION: 8 weeks  PLANNED INTERVENTIONS: 97110-Therapeutic exercises, 97530- Therapeutic activity, 97112- Neuromuscular re-education, 97535- Self Care, 02859- Manual therapy, [phone]- Gait training,  [phone]- Aquatic Therapy, [phone]- Traction (mechanical), [phone]- Ionotophoresis 4mg /ml Dexamethasone, 79439 (1-2 muscles), [phone] (3+ muscles)- Dry Needling, Patient/Family education, Balance training, Stair training, Taping, Joint mobilization, Spinal mobilization, Cryotherapy, and Moist heat.  PLAN FOR NEXT SESSION: PRECAUTION: SEIZURES AND HAS DENTURES (NO ESTIM), Review and progress HEP, ROM, strengthening,  manual/DN to lumbar spine, spinal mobs, body mechanics and ADL modifications, traction?    Mliss Cummins, PT 01/07/24 4:54 PM

## 2024-01-07 NOTE — Progress Notes (Signed)
 Established Patient Office Visit  Subjective   Patient ID: Drew Smith, male    DOB: 06/12/1940  Age: 83 y.o. MRN: [id number]  Chief Complaint  Patient presents with   Hip Pain         HPI   Mr. Rasheed has history of complete heart block, hypertension, hypertrophic cardiomyopathy, atrial fibrillation, GERD, dyslipidemia, history of seizures, past history of Gamber a syndrome, history of prostate cancer.  He is seen with low back pain for a couple weeks at least.  Denies any specific injury.  Stays fairly active with golf.  Pain location is right lower lumbar region.  Was seen here just couple of days ago.  Referred for PT but wonders if his symptoms may have been worse after that.  He has been taking Tylenol but only 500 mg once or twice daily.  He had actually gone to chiropractor few weeks ago prior to visit here.  Had MRI spine back in February 2022 which showed severe L4-5 facet arthrosis with severe spinal stenosis.  He had some mild spinal stenosis L3-4.  He was prescribed Zanaflex recently and taking 2 mg but has not seen any real improvement.  Past Medical History:  Diagnosis Date   Arthritis    Asthma    Cancer (HCC)    prostate   GERD (gastroesophageal reflux disease)    Guillain-Barre syndrome 02/25/1998   Hyperlipidemia    Hypertension    Seizures (HCC)    Past Surgical History:  Procedure Laterality Date   CATARACT EXTRACTION     EYE SURGERY  02/26/1943   83 years old   HERNIA REPAIR  02/25/1993   NOSE SURGERY  02/26/1988   PROSTATE SURGERY  02/25/1998   seed inplants   SHOULDER SURGERY  02/25/2006    reports that he has never smoked. He has never used smokeless tobacco. He reports current alcohol use of about 5.0 standard drinks of alcohol per week. He reports that he does not use drugs. family history includes Alcohol abuse in his father; Bladder Cancer in his brother; Diabetes in his brother and brother; Hyperlipidemia in his brother; Stroke in his  brother. Allergies  Allergen Reactions   Flublok [Influenza Vaccine Recombinant]    Other     Shingles vaccine    Review of Systems  Constitutional:  Negative for chills, fever and weight loss.  Cardiovascular:  Negative for chest pain.  Genitourinary:  Negative for dysuria, flank pain and hematuria.  Musculoskeletal:  Positive for back pain.  Neurological:  Negative for sensory change and focal weakness.      Objective:     BP 138/60   Pulse 63   Temp 98.3 F (36.8 C) (Oral)   Wt 162 lb 8 oz (73.7 kg)   SpO2 94%   BMI 24.71 kg/m  BP Readings from Last 3 Encounters:  01/07/24 138/60  01/05/24 138/72  12/02/23 (!) 134/55   Wt Readings from Last 3 Encounters:  01/07/24 162 lb 8 oz (73.7 kg)  01/05/24 162 lb 6.4 oz (73.7 kg)  12/02/23 160 lb (72.6 kg)      Physical Exam Vitals reviewed.  Constitutional:      General: He is not in acute distress.    Appearance: He is not ill-appearing.  Cardiovascular:     Rate and Rhythm: Normal rate and regular rhythm.  Pulmonary:     Effort: Pulmonary effort is normal.     Breath sounds: Normal breath sounds. No wheezing or rales.  Musculoskeletal:     Comments: Straight leg raises are negative bilaterally.  No lumbar spinal tenderness.  Neurological:     Mental Status: He is alert.      No results found for any visits on 01/07/24.    The ASCVD Risk score (Arnett DK, et al., 2019) failed to calculate for the following reasons:   The 2019 ASCVD risk score is only valid for ages 11 to 87    Assessment & Plan:   Problem List Items Addressed This Visit   None Visit Diagnoses       Lumbar pain    -  Primary   Relevant Orders   Ambulatory referral to Physical Therapy     83 year old male with multiple chronic medical problems as above presenting with few week history of right lumbar back pain.  No pain over the spine itself.  No clinical suspicion for compression fracture.  No sciatica symptoms.  Does have remote  history of prostate cancer but no recent reported weight loss.  -Currently only taking 500 mg Tylenol once or twice a day.  We explained that he could safely go up to 500 mg to every 6-8 hours as needed - Also consider over-the-counter Salonpas patch - We did discuss possible option of low-dose prednisone  but he would like to try the above first - Continue physical therapy - Set up 1 week follow-up. - Discussed red flags to watch for such as fever, sciatica, focal weakness, etc.  Return in about 1 week (around 01/14/2024).    Wolm Scarlet, MD

## 2024-01-07 NOTE — Telephone Encounter (Signed)
 FYI Only or Action Required?: Action required by provider: referral request.  Patient was last seen in primary care on 01/05/2024 by Drew Smith, [name], MD.  Called Nurse Triage reporting Hip Pain.  Symptoms began a week ago.  Interventions attempted: Prescription medications: Zanaflex  and Rest, hydration, or home remedies.  Symptoms are: gradually worsening.  Triage Disposition: See HCP Within 4 Hours (Or PCP Triage)  Patient/caregiver understands and will follow disposition?: Yes    Copied from CRM [id number]. Topic: Clinical - Red Word Triage >> Jan 07, 2024  8:27 AM Charlet HERO wrote: Red Word that prompted transfer to Nurse Triage: Patient is calling about his right side hip is in severe pain states it is getting worse. Burchette Brassfield Reason for Disposition  [1] SEVERE pain (e.Smith., excruciating, unable to do any normal activities) AND [2] not improved after 2 hours of pain medicine  Answer Assessment - Initial Assessment Questions Additional info: 1) had evaluation on 01/05/24 received muscle relaxer which are not having an effect. His right hip pain is worsening and now radiating down his right leg. He did some physical therapy but didn't find that session helpful, he is requesting to change referral to PT across from clinic. He would like to attend PT today if possible   1. LOCATION and RADIATION: Where is the pain located? Does the pain spread (shoot) anywhere else?     Right -radiating down leg 2. QUALITY: What does the pain feel like?  (e.Smith., sharp, dull, aching, burning)     shooting 3. SEVERITY: How bad is the pain? What does it keep you from doing?   (Scale 1-10; or mild, moderate, severe)     Standing 2/10, when bend 8/10 4. ONSET: When did the pain start? Does it come and go, or is it there all the time?     10 days 5. WORK OR EXERCISE: Has there been any recent work or exercise that involved this part of the body?       6. CAUSE: What do you  think is causing the hip pain?      unsure 7. AGGRAVATING FACTORS: What makes the hip pain worse? (e.Smith., walking, climbing stairs, running)     Bending, flexing 8. OTHER SYMPTOMS: Do you have any other symptoms? (e.Smith., back pain, pain shooting down leg,  fever, rash)     Denies  Protocols used: Hip Pain-A-AH

## 2024-01-07 NOTE — Patient Instructions (Addendum)
 May increase the Tylenol to 500 mg two every 6 hours as needed  Could also use topical Salon Pas patch.    Let's set up one week follow up.

## 2024-01-08 ENCOUNTER — Ambulatory Visit: Admitting: Physical Therapy

## 2024-01-08 DIAGNOSIS — M9903 Segmental and somatic dysfunction of lumbar region: Secondary | ICD-10-CM | POA: Diagnosis not present

## 2024-01-08 DIAGNOSIS — M5136 Other intervertebral disc degeneration, lumbar region with discogenic back pain only: Secondary | ICD-10-CM | POA: Diagnosis not present

## 2024-01-08 DIAGNOSIS — M9905 Segmental and somatic dysfunction of pelvic region: Secondary | ICD-10-CM | POA: Diagnosis not present

## 2024-01-08 DIAGNOSIS — M9904 Segmental and somatic dysfunction of sacral region: Secondary | ICD-10-CM | POA: Diagnosis not present

## 2024-01-12 DIAGNOSIS — M5136 Other intervertebral disc degeneration, lumbar region with discogenic back pain only: Secondary | ICD-10-CM | POA: Diagnosis not present

## 2024-01-12 DIAGNOSIS — M9905 Segmental and somatic dysfunction of pelvic region: Secondary | ICD-10-CM | POA: Diagnosis not present

## 2024-01-12 DIAGNOSIS — M9904 Segmental and somatic dysfunction of sacral region: Secondary | ICD-10-CM | POA: Diagnosis not present

## 2024-01-12 DIAGNOSIS — M9903 Segmental and somatic dysfunction of lumbar region: Secondary | ICD-10-CM | POA: Diagnosis not present

## 2024-01-13 NOTE — Therapy (Signed)
 OUTPATIENT PHYSICAL THERAPY THORACOLUMBAR TREATMENT   Patient Name: Drew Smith MRN: [id number] DOB:Oct 15, 1940, 83 y.o., male Today's Date: 01/14/2024  END OF SESSION:  PT End of Session - 01/14/24 1449     Visit Number 2    Date for Recertification  03/03/24    Authorization Type HTA - no auth reqd; med nec    Progress Note Due on Visit 10    PT Start Time 1449    PT Stop Time 1533    PT Time Calculation (min) 44 min    Activity Tolerance Patient tolerated treatment well;Patient limited by pain    Behavior During Therapy WFL for tasks assessed/performed           Past Medical History:  Diagnosis Date   Arthritis    Asthma    Cancer (HCC)    prostate   GERD (gastroesophageal reflux disease)    Guillain-Barre syndrome 02/25/1998   Hyperlipidemia    Hypertension    Seizures (HCC)    Past Surgical History:  Procedure Laterality Date   CATARACT EXTRACTION     EYE SURGERY  02/26/1943   83 years old   HERNIA REPAIR  02/25/1993   NOSE SURGERY  02/26/1988   PROSTATE SURGERY  02/25/1998   seed inplants   SHOULDER SURGERY  02/25/2006   Patient Active Problem List   Diagnosis Date Noted   Hypertrophic cardiomyopathy (HCC) 04/01/2023   Actinic keratosis 01/02/2022   Hypokalemia 05/06/2021   CHB (complete heart block) (HCC) 05/05/2021   Syncope 05/04/2021   Paroxysmal atrial fibrillation (HCC) 05/04/2021   History of seizures 05/04/2021   Malignant melanoma of left forearm (HCC) 10/19/2019   Upper respiratory tract infection 02/14/2017   Porokeratosis 09/07/2014   Metatarsal deformity 09/07/2014   Pain in lower limb 09/07/2014   Squamous cell carcinoma in situ of skin 10/15/2013   Hives 10/06/2012   History of Guillain-Barre syndrome 03/07/2011   Hypertension 11/28/2010   Dyslipidemia 11/28/2010   ERECTILE DYSFUNCTION 01/05/2008   Asthma 10/28/2007   GERD 10/28/2007   DIZZINESS 10/28/2007   COUGH 10/28/2007   FASTING HYPERGLYCEMIA 10/28/2007    PROSTATE CANCER, HX OF 10/28/2007    PCP: Micheal Wolm ORN, MD   REFERRING PROVIDER: Micheal Wolm ORN, MD   REFERRING DIAG: M54.50 (ICD-10-CM) - Lumbar pain   Rationale for Evaluation and Treatment: Rehabilitation  THERAPY DIAG:  Other low back pain  Cramp and spasm  ONSET DATE: 2 weeks ago  SUBJECTIVE:  SUBJECTIVE STATEMENT: My right hip is the only thing hurting me right now.   EVAL: Plays a lot of golf. I try to do a lot of exercises and stretch. Sees a chiropractor, saw Monday and today and it helped. It was a lot of stretching.  I can't sleep or walk right. I went to [facility] on Monday and they were very aggressive with me. Yesterday I was in a lot of pain. A little better today, but still bad.  PERTINENT HISTORY:   Fatty heart, OA, HTN, seizures (been over a year - takes meds), prostate CA, leg cramps  PAIN:  01/14/2024 Yes, 2-3/10 pain in R hip to greater trochanter  Eval: Are you having pain? Yes: NPRS scale: 2-3/10 up to 9/10 Pain location: low back right side shoots down right leg to foot and foot feels numb Pain description: dull sitting, sharp when standing Aggravating factors: sit to stand,  standing and walking Relieving factors: Tylenol  PRECAUTIONS: Other: SEIZURES - NO ESTIM  RED FLAGS: None   WEIGHT BEARING RESTRICTIONS: No  FALLS:  Has patient fallen in last 6 months? No  LIVING ENVIRONMENT: Lives with: lives alone Lives in: House/apartment Stairs: Yes: External: 2 steps; none Has following equipment at home: None  OCCUPATION: retired  PLOF: Independent  PATIENT GOALS: back to golf  NEXT MD VISIT: one week  OBJECTIVE:  Note: Objective measures were completed at Evaluation unless otherwise noted.  DIAGNOSTIC FINDINGS:  2022  MRI Lumbar 1.  Severe L4-5 facet arthrosis with grade 1 anterolisthesis and severe spinal stenosis. 2. Mild spinal stenosis and mild-to-moderate lateral recess stenosis at L3-4.  PATIENT SURVEYS:  Modified Oswestry:  MODIFIED OSWESTRY DISABILITY SCALE  Date: 01/07/24 Score  Pain intensity 3 =  Pain medication provides me with moderate relief from pain.  2. Personal care (washing, dressing, etc.) 2 =  It is painful to take care of myself, and I am slow and careful.  3. Lifting 2 = Pain prevents me from lifting heavy weights off the floor,but I can manage if the weights are conveniently positioned (e.g. on a table)  4. Walking 3 =  Pain prevents me from walking more than  mile.  5. Sitting 2 =  Pain prevents me from sitting more than 1 hour.  6. Standing 3 =  Pain prevents me from standing more than 1/2 hour.  7. Sleeping 3 =  Even when I take pain medication, I sleep less than 4 hours.  8. Social Life 2 = Pain prevents me from participating in more energetic activities (eg. sports, dancing).  9. Traveling 2 =  My pain restricts my travel over 2 hours.  10. Employment/ Homemaking 2 = I can perform most of my homemaking/job duties, but pain prevents me from performing more physically stressful activities (eg, lifting, vacuuming).  Total 24/50   Interpretation of scores: Score Category Description  0-20% Minimal Disability The patient can cope with most living activities. Usually no treatment is indicated apart from advice on lifting, sitting and exercise  21-40% Moderate Disability The patient experiences more pain and difficulty with sitting, lifting and standing. Travel and social life are more difficult and they may be disabled from work. Personal care, sexual activity and sleeping are not grossly affected, and the patient can usually be managed by conservative means  41-60% Severe Disability Pain remains the main problem in this group, but activities of daily living are affected. These patients require a  detailed investigation  61-80% Crippled Back pain  impinges on all aspects of the patient's life. Positive intervention is required  81-100% Bed-bound These patients are either bed-bound or exaggerating their symptoms  Bluford FORBES Zoe DELENA Karon DELENA, et al. Surgery versus conservative management of stable thoracolumbar fracture: the PRESTO feasibility RCT. [place] ([place]): [employer]; 2021 Nov. [facility] Technology Assessment, No. 25.62.) Appendix 3, Oswestry Disability Index category descriptors. Available from: Findjewelers.cz  Minimally Clinically Important Difference (MCID) = 12.8%  COGNITION: Overall cognitive status: Within functional limits for tasks assessed     SENSATION: Tingling in both feet x 1-2 yrs  MUSCLE LENGTH:   POSTURE: rounded shoulders, forward head, decreased lumbar lordosis, increased thoracic kyphosis, posterior pelvic tilt, and flexed trunk   PALPATION: R lower lumbar and near PSIS  LUMBAR ROM: * pain  AROM eval  Flexion 25%*  Extension 25%  Right lateral flexion WFL*  Left lateral flexion WFL  Right rotation 50%  Left rotation 50%   (Blank rows = not tested)  LOWER EXTREMITY ROM:   WFL for tasks assessed  LOWER EXTREMITY MMT:  *pain with resisted R hip flexion Grossly 4+/5 to 5/5 in B hips, knees and ankle DF   LUMBAR SPECIAL TESTS:  Straight leg raise test: Positive and Quadrant test: Positive  FUNCTIONAL TESTS:  5 times sit to stand: 32.86 sec 3-5/10 pain  GAIT: Distance walked: 20 Comments: antalgic, decreased stance time R  TREATMENT DATE:                                                                                                                               01/14/24 Nustep L5 x 6 min Seated fig 4 3 x 30 sec  Seated LAQ x 10 B LTR x 10 B Bridge x 10 SKTC 20 sec hold x 3 B Seated chops with ball x 10 B Seated ball lift knees to OH x 10 MFR with ball to R QL and gluteals Manual: TPR to R  QL followed by IASTM with Addaday to R lumbar and gluteals   01/07/24   See pt ed and HEP    PATIENT EDUCATION:  Education details: PT eval findings, anticipated POC, initial HEP, HEP review, postural awareness, posture and body mechanics for typical daily postioning, mobility and household tasks, role of TPDN, fee schedule for TPDN and lack of insurance coverage requiring payment at time of service, and discussion of limiting twisting exercises and any exercise that causes increased pain at this time, discussion of his seizures   Person educated: Patient Education method: Explanation, Demonstration, Tactile cues, Verbal cues, and Handouts Education comprehension: verbalized understanding and returned demonstration  HOME EXERCISE PROGRAM: Access Code: [email or web address] URL: [email or web address] Date: 01/14/2024 Prepared by: Mliss  Exercises - Supine Bridge  - 2 x daily - 7 x weekly - 1-3 sets - 10 reps - Supine Lower Trunk Rotation  - 2 x daily - 7 x weekly - 1 sets - 5 reps - 10 sec hold -  Seated Long Arc Quad  - 1 x daily - 7 x weekly - 2 sets - 10 reps - 5 sec hold - Seated Figure 4 Piriformis Stretch  - 2 x daily - 7 x weekly - 1 sets - 3 reps - 30-60 sec hold - Supine Figure 4 Piriformis Stretch  - 1 x daily - 7 x weekly - 1 sets - 3 reps - 30-60 sec hold - Hooklying Single Knee to Chest Stretch  - 1 x daily - 3 x weekly - 1 sets - 3 reps - 20 sec hold  ASSESSMENT:  CLINICAL IMPRESSION: Patient reports he is feeling more flexible. His pain is staying in his back and right hip and hasn't been going lower. He is still very tight in his hips and feels pain in the right post hip when stretching the L piriformis initially. He also had pain with bridging. He responded well to manual therapy and then was able to complete bridging with minimal pain. Pt educated on self MFR. He has a golf tournament this weekend and is hoping he'll be able to play.    EVAL: Patient is a 83 y.o.  male who was seen today for physical therapy evaluation and treatment for acute LBP starting 2 weeks ago. He reports marked pain with sit to stand transfers, bed mobility, sitting, standing and walking. His pain affects all ADLs including sleep. He also cannot golf which is his main goal. He has pain with lumbar flexion and R SB, pain with resisted hip flexion. He has pain with B SLR. He has a h/o of severe L4/5 facet arthrosis and spinal stenosis. He is very active and would like to return to his PLOF. Pt will benefit from skilled PT to address these deficits and those listed below. He has a history of seizures and is on med to control these. He has not had one in over a year, but has a denture which would need to be removed if a seizure occurs.   OBJECTIVE IMPAIRMENTS: Abnormal gait, decreased activity tolerance, decreased mobility, difficulty walking, decreased ROM, decreased strength, dizziness, increased muscle spasms, impaired flexibility, postural dysfunction, and pain.   ACTIVITY LIMITATIONS: carrying, lifting, bending, sitting, standing, squatting, sleeping, stairs, transfers, bed mobility, bathing, dressing, hygiene/grooming, and locomotion level  PARTICIPATION LIMITATIONS: meal prep, cleaning, laundry, driving, shopping, community activity, yard work, and golf  PERSONAL FACTORS: Age and 3+ comorbidities: OA, leg cramps, heart issues are also affecting patient's functional outcome.   REHAB POTENTIAL: Good  CLINICAL DECISION MAKING: Evolving/moderate complexity  EVALUATION COMPLEXITY: Moderate   GOALS: Goals reviewed with patient? Yes  SHORT TERM GOALS: Target date: 02/04/2024   Patient will be independent with initial HEP.  Baseline:  Goal status: INITIAL  2.  Patient will report centralization of radicular symptoms.  Baseline:  Goal status: INITIAL  3.  Decreased back pain by 30% to improve QOL. Baseline:  Goal status: INITIAL  4.  Patient able to sleep without waking  from back pain Baseline:  Goal status: INITIAL    LONG TERM GOALS: Target date: 03/03/2024   Patient will be independent with advanced/ongoing HEP to improve outcomes and carryover.  Baseline:  Goal status: INITIAL  72.  Patient will report 75% improvement in low back pain with ADLs to improve QOL.  Baseline:  Goal status: INITIAL  3.  Patient will demonstrate functional lumbar flexion to perform ADLs and return to golf   Baseline:  Goal status: INITIAL  4.  Patient will be  able to perform 5xSTS in less than 20 seconds showing functional improvement in strength without increased pain Baseline:  Goal status: INITIAL  5.  Patient will 18 or less on the Modified Oswestry demonstrating improved functional ability.  Baseline: 24 Goal status: INITIAL    PLAN:  PT FREQUENCY: 2x/week  PT DURATION: 8 weeks  PLANNED INTERVENTIONS: 97110-Therapeutic exercises, 97530- Therapeutic activity, 97112- Neuromuscular re-education, 97535- Self Care, 02859- Manual therapy, [phone]- Gait training, [phone]- Aquatic Therapy, [phone]- Traction (mechanical), [phone]- Ionotophoresis 4mg /ml Dexamethasone, 79439 (1-2 muscles), 20561 (3+ muscles)- Dry Needling, Patient/Family education, Balance training, Stair training, Taping, Joint mobilization, Spinal mobilization, Cryotherapy, and Moist heat.  PLAN FOR NEXT SESSION: PRECAUTION: SEIZURES AND HAS DENTURES (NO ESTIM), Review and progress HEP, ROM, strengthening,  manual/DN to lumbar spine, spinal mobs, body mechanics and ADL modifications, traction?    Mliss Cummins, PT 01/14/24 3:51 PM

## 2024-01-14 ENCOUNTER — Encounter: Payer: Self-pay | Admitting: Physical Therapy

## 2024-01-14 ENCOUNTER — Ambulatory Visit: Admitting: Physical Therapy

## 2024-01-14 ENCOUNTER — Encounter: Payer: Self-pay | Admitting: Neurology

## 2024-01-14 ENCOUNTER — Ambulatory Visit (INDEPENDENT_AMBULATORY_CARE_PROVIDER_SITE_OTHER): Admitting: Family Medicine

## 2024-01-14 ENCOUNTER — Encounter: Payer: Self-pay | Admitting: Family Medicine

## 2024-01-14 VITALS — BP 130/60 | HR 59 | Temp 98.1°F | Wt 160.7 lb

## 2024-01-14 DIAGNOSIS — M5459 Other low back pain: Secondary | ICD-10-CM | POA: Diagnosis not present

## 2024-01-14 DIAGNOSIS — R252 Cramp and spasm: Secondary | ICD-10-CM

## 2024-01-14 DIAGNOSIS — M48061 Spinal stenosis, lumbar region without neurogenic claudication: Secondary | ICD-10-CM | POA: Diagnosis not present

## 2024-01-14 NOTE — Progress Notes (Unsigned)
 Established Patient Office Visit  Subjective   Patient ID: Drew Smith, male    DOB: 09-29-1940  Age: 83 y.o. MRN: [id number]  Chief Complaint  Patient presents with   Medical Management of Chronic Issues    HPI  {History (Optional):23778} Drew Smith is seen for follow-up regarding low back pain.  Refer to note from last week for details.  He stays very active.  Still enjoys golfing.  Was able to golf this past Monday and has tournament scheduled this Saturday.  He states that his pain is some improved compared with last week.  He was reluctant to try any steroids.  Has been taking Tylenol and has stepped up frequency of Tylenol use.  History of severe L4-5 facet arthrosis and severe spinal stenosis from MRI 2022.  He has been doing some back exercises that were given by physical therapist.  Pain is at least 50% improved compared to last week.  Ambulating without difficulty.  No urine or stool incontinence.  Remote history of Guillain-Barr syndrome  Past Medical History:  Diagnosis Date   Arthritis    Asthma    Cancer (HCC)    prostate   GERD (gastroesophageal reflux disease)    Guillain-Barre syndrome 02/25/1998   Hyperlipidemia    Hypertension    Seizures (HCC)    Past Surgical History:  Procedure Laterality Date   CATARACT EXTRACTION     EYE SURGERY  02/26/1943   83 years old   HERNIA REPAIR  02/25/1993   NOSE SURGERY  02/26/1988   PROSTATE SURGERY  02/25/1998   seed inplants   SHOULDER SURGERY  02/25/2006    reports that he has never smoked. He has never used smokeless tobacco. He reports current alcohol use of about 5.0 standard drinks of alcohol per week. He reports that he does not use drugs. family history includes Alcohol abuse in his father; Bladder Cancer in his brother; Diabetes in his brother and brother; Hyperlipidemia in his brother; Stroke in his brother. Allergies  Allergen Reactions   Flublok [Influenza Vaccine Recombinant]    Other      Shingles vaccine    Review of Systems  Constitutional:  Negative for chills and fever.  Respiratory:  Negative for cough.   Cardiovascular:  Negative for chest pain.  Musculoskeletal:  Positive for back pain.  Neurological:  Negative for sensory change and focal weakness.      Objective:     BP 130/60   Pulse (!) 59   Temp 98.1 F (36.7 C) (Oral)   Wt 160 lb 11.2 oz (72.9 kg)   SpO2 97%   BMI 24.43 kg/m  BP Readings from Last 3 Encounters:  01/14/24 130/60  01/07/24 138/60  01/05/24 138/72   Wt Readings from Last 3 Encounters:  01/14/24 160 lb 11.2 oz (72.9 kg)  01/07/24 162 lb 8 oz (73.7 kg)  01/05/24 162 lb 6.4 oz (73.7 kg)      Physical Exam Vitals reviewed.  Constitutional:      General: He is not in acute distress.    Appearance: He is not ill-appearing.  Cardiovascular:     Rate and Rhythm: Normal rate and regular rhythm.  Pulmonary:     Effort: Pulmonary effort is normal.     Breath sounds: Normal breath sounds.  Musculoskeletal:     Comments: No lumbar spinal tenderness.  Straight leg raises are negative bilaterally.  Neurological:     Mental Status: He is alert.     Comments:  Full strength with plantarflexion, dorsiflexion, and knee extension bilaterally      No results found for any visits on 01/14/24.  {Labs (Optional):23779}  The ASCVD Risk score (Arnett DK, et al., 2019) failed to calculate for the following reasons:   The 2019 ASCVD risk score is only valid for ages 68 to 71    Assessment & Plan:   Right lumbar back pain with history of known lumbar stenosis.  Overall improved.  Continue Tylenol 500 mg 1-2 every 6 hours as needed.  Continue PT.  Be in touch if not continuing to improve over the next couple weeks  Wolm Scarlet, MD

## 2024-01-15 DIAGNOSIS — M9903 Segmental and somatic dysfunction of lumbar region: Secondary | ICD-10-CM | POA: Diagnosis not present

## 2024-01-15 DIAGNOSIS — M5136 Other intervertebral disc degeneration, lumbar region with discogenic back pain only: Secondary | ICD-10-CM | POA: Diagnosis not present

## 2024-01-15 DIAGNOSIS — M9905 Segmental and somatic dysfunction of pelvic region: Secondary | ICD-10-CM | POA: Diagnosis not present

## 2024-01-15 DIAGNOSIS — M9904 Segmental and somatic dysfunction of sacral region: Secondary | ICD-10-CM | POA: Diagnosis not present

## 2024-01-19 NOTE — Therapy (Signed)
 OUTPATIENT PHYSICAL THERAPY THORACOLUMBAR TREATMENT   Patient Name: Drew Smith MRN: [id number] DOB:[birth year], 83 y.o., male Today's Date: 01/20/2024  END OF SESSION:  PT End of Session - 01/20/24 1236     Visit Number 3    Date for Recertification  03/03/24    Authorization Type HTA - no auth reqd; med nec    Progress Note Due on Visit 10    PT Start Time 1235    PT Stop Time 1315    PT Time Calculation (min) 40 min    Activity Tolerance Patient tolerated treatment well;Patient limited by pain    Behavior During Therapy WFL for tasks assessed/performed            Past Medical History:  Diagnosis Date   Arthritis    Asthma    Cancer (HCC)    prostate   GERD (gastroesophageal reflux disease)    Guillain-Barre syndrome 02/25/1998   Hyperlipidemia    Hypertension    Seizures (HCC)    Past Surgical History:  Procedure Laterality Date   CATARACT EXTRACTION     EYE SURGERY  02/26/1943   83 years old   HERNIA REPAIR  02/25/1993   NOSE SURGERY  02/26/1988   PROSTATE SURGERY  02/25/1998   seed inplants   SHOULDER SURGERY  02/25/2006   Patient Active Problem List   Diagnosis Date Noted   Hypertrophic cardiomyopathy (HCC) 04/01/2023   Actinic keratosis 01/02/2022   Hypokalemia 05/06/2021   CHB (complete heart block) (HCC) 05/05/2021   Syncope 05/04/2021   Paroxysmal atrial fibrillation (HCC) 05/04/2021   History of seizures 05/04/2021   Malignant melanoma of left forearm (HCC) 10/19/2019   Upper respiratory tract infection 02/14/2017   Porokeratosis 09/07/2014   Metatarsal deformity 09/07/2014   Pain in lower limb 09/07/2014   Squamous cell carcinoma in situ of skin 10/15/2013   Hives 10/06/2012   History of Guillain-Barre syndrome 03/07/2011   Hypertension 11/28/2010   Dyslipidemia 11/28/2010   ERECTILE DYSFUNCTION 01/05/2008   Asthma 10/28/2007   GERD 10/28/2007   DIZZINESS 10/28/2007   COUGH 10/28/2007   FASTING HYPERGLYCEMIA 10/28/2007    PROSTATE CANCER, HX OF 10/28/2007    PCP: Micheal Wolm ORN, MD   REFERRING PROVIDER: Micheal Wolm ORN, MD   REFERRING DIAG: M54.50 (ICD-10-CM) - Lumbar pain   Rationale for Evaluation and Treatment: Rehabilitation  THERAPY DIAG:  Other low back pain  Cramp and spasm  ONSET DATE: 2 weeks ago  SUBJECTIVE:  SUBJECTIVE STATEMENT: I'm pretty tired. I did a lot of work this morning with my HH aide around the house and I haven't eaten lunch yet.  My back pain has gotten better, but my leg pain is worse today. No 9/10 pain in the last week to 10 days.   EVAL: Plays a lot of golf. I try to do a lot of exercises and stretch. Sees a chiropractor, saw Monday and today and it helped. It was a lot of stretching.  I can't sleep or walk right. I went to [facility] on Monday and they were very aggressive with me. Yesterday I was in a lot of pain. A little better today, but still bad.  PERTINENT HISTORY:   Fatty heart, OA, HTN, seizures (been over a year - takes meds), prostate CA, leg cramps  PAIN:  01/20/2024 Yes, 2-3/10 pain in R hip to greater trochanter  Eval: Are you having pain? Yes: NPRS scale: 2-3/10 Pain location: low back right side shoots down right leg to foot and foot feels numb Pain description: dull sitting, sharp when standing Aggravating factors: sit to stand,  standing and walking Relieving factors: Tylenol  PRECAUTIONS: Other: SEIZURES - NO ESTIM  RED FLAGS: None   WEIGHT BEARING RESTRICTIONS: No  FALLS:  Has patient fallen in last 6 months? No  LIVING ENVIRONMENT: Lives with: lives alone Lives in: House/apartment Stairs: Yes: External: 2 steps; none Has following equipment at home: None  OCCUPATION: retired  PLOF: Independent  PATIENT GOALS: back to golf  NEXT MD  VISIT: one week  OBJECTIVE:  Note: Objective measures were completed at Evaluation unless otherwise noted.  DIAGNOSTIC FINDINGS:  2022  MRI Lumbar 1. Severe L4-5 facet arthrosis with grade 1 anterolisthesis and severe spinal stenosis. 2. Mild spinal stenosis and mild-to-moderate lateral recess stenosis at L3-4.  PATIENT SURVEYS:  Modified Oswestry:  MODIFIED OSWESTRY DISABILITY SCALE  Date: 01/07/24 Score  Pain intensity 3 =  Pain medication provides me with moderate relief from pain.  2. Personal care (washing, dressing, etc.) 2 =  It is painful to take care of myself, and I am slow and careful.  3. Lifting 2 = Pain prevents me from lifting heavy weights off the floor,but I can manage if the weights are conveniently positioned (e.g. on a table)  4. Walking 3 =  Pain prevents me from walking more than  mile.  5. Sitting 2 =  Pain prevents me from sitting more than 1 hour.  6. Standing 3 =  Pain prevents me from standing more than 1/2 hour.  7. Sleeping 3 =  Even when I take pain medication, I sleep less than 4 hours.  8. Social Life 2 = Pain prevents me from participating in more energetic activities (eg. sports, dancing).  9. Traveling 2 =  My pain restricts my travel over 2 hours.  10. Employment/ Homemaking 2 = I can perform most of my homemaking/job duties, but pain prevents me from performing more physically stressful activities (eg, lifting, vacuuming).  Total 24/50   Interpretation of scores: Score Category Description  0-20% Minimal Disability The patient can cope with most living activities. Usually no treatment is indicated apart from advice on lifting, sitting and exercise  21-40% Moderate Disability The patient experiences more pain and difficulty with sitting, lifting and standing. Travel and social life are more difficult and they may be disabled from work. Personal care, sexual activity and sleeping are not grossly affected, and the patient can usually be managed  by  conservative means  41-60% Severe Disability Pain remains the main problem in this group, but activities of daily living are affected. These patients require a detailed investigation  61-80% Crippled Back pain impinges on all aspects of the patient's life. Positive intervention is required  81-100% Bed-bound These patients are either bed-bound or exaggerating their symptoms  Bluford FORBES Zoe DELENA Karon DELENA, et al. Surgery versus conservative management of stable thoracolumbar fracture: the PRESTO feasibility RCT. [place] ([place]): [employer]; 2021 Nov. [facility] Technology Assessment, No. 25.62.) Appendix 3, Oswestry Disability Index category descriptors. Available from: Findjewelers.cz  Minimally Clinically Important Difference (MCID) = 12.8%  COGNITION: Overall cognitive status: Within functional limits for tasks assessed     SENSATION: Tingling in both feet x 1-2 yrs  MUSCLE LENGTH:   POSTURE: rounded shoulders, forward head, decreased lumbar lordosis, increased thoracic kyphosis, posterior pelvic tilt, and flexed trunk   PALPATION: R lower lumbar and near PSIS  LUMBAR ROM: * pain  AROM eval  Flexion 25%*  Extension 25%  Right lateral flexion WFL*  Left lateral flexion WFL  Right rotation 50%  Left rotation 50%   (Blank rows = not tested)  LOWER EXTREMITY ROM:   WFL for tasks assessed  LOWER EXTREMITY MMT:  *pain with resisted R hip flexion Grossly 4+/5 to 5/5 in B hips, knees and ankle DF   LUMBAR SPECIAL TESTS:  Straight leg raise test: Positive and Quadrant test: Positive  FUNCTIONAL TESTS:  5 times sit to stand: 32.86 sec 3-5/10 pain  GAIT: Distance walked: 20 Comments: antalgic, decreased stance time R  TREATMENT DATE:                                                                                                                               01/20/24 Nustep L5 x 6 min Seated fig 4  x 30 sec  Seated hip IR/ER x 20   Seated up and over hurdle x 10 ea Standing up and over hurdle x 10 ea SL toe taps to cones x 5 ea side Diver to table x 10 ea side - harder on R and pain in R low back Seated LAQ  with neck flexion for nerve glide x 10 R Pallof press green with dowel 2x10 Standing chops 3# x 10 B at [place]    01/14/24 Nustep L5 x 6 min Seated fig 4 3 x 30 sec  Seated LAQ x 10 B LTR x 10 B Bridge x 10 SKTC 20 sec hold x 3 B Seated chops with ball x 10 B Seated ball lift knees to OH x 10 MFR with ball to R QL and gluteals Manual: TPR to R QL followed by IASTM with [place]aday to R lumbar and gluteals   01/07/24   See pt ed and HEP    PATIENT EDUCATION:  Education details: PT eval findings, anticipated POC, initial HEP, HEP review, postural awareness, posture and body mechanics for  typical daily postioning, mobility and household tasks, role of TPDN, fee schedule for TPDN and lack of insurance coverage requiring payment at time of service, and discussion of limiting twisting exercises and any exercise that causes increased pain at this time, discussion of his seizures   Person educated: Patient Education method: Explanation, Demonstration, Tactile cues, Verbal cues, and Handouts Education comprehension: verbalized understanding and returned demonstration  HOME EXERCISE PROGRAM: Access Code: [email or web address] URL: [email or web address] Date: 01/14/2024 Prepared by: Mliss  Exercises - Supine Bridge  - 2 x daily - 7 x weekly - 1-3 sets - 10 reps - Supine Lower Trunk Rotation  - 2 x daily - 7 x weekly - 1 sets - 5 reps - 10 sec hold - Seated Long Arc Quad  - 1 x daily - 7 x weekly - 2 sets - 10 reps - 5 sec hold - Seated Figure 4 Piriformis Stretch  - 2 x daily - 7 x weekly - 1 sets - 3 reps - 30-60 sec hold - Supine Figure 4 Piriformis Stretch  - 1 x daily - 7 x weekly - 1 sets - 3 reps - 30-60 sec hold - Hooklying Single Knee to Chest Stretch  - 1 x daily - 3 x weekly - 1 sets -  3 reps - 20 sec hold  ASSESSMENT:  CLINICAL IMPRESSION: Patient reports less pain overall and no 9/10 pain in the past 10 days. He still experiences pain primarily into the R hip and thigh which is sometimes worse right after stretching, but then feels better. He was able to play golf on Saturday, but did require taking Tylenol before and during the tournament. He has tension with sciatic nerve glide B, more on the right. He continues to demonstrate potential for improvement and would benefit from continued skilled therapy to address remaining impairments.     EVAL: Patient is a 83 y.o. male who was seen today for physical therapy evaluation and treatment for acute LBP starting 2 weeks ago. He reports marked pain with sit to stand transfers, bed mobility, sitting, standing and walking. His pain affects all ADLs including sleep. He also cannot golf which is his main goal. He has pain with lumbar flexion and R SB, pain with resisted hip flexion. He has pain with B SLR. He has a h/o of severe L4/5 facet arthrosis and spinal stenosis. He is very active and would like to return to his PLOF. Pt will benefit from skilled PT to address these deficits and those listed below. He has a history of seizures and is on med to control these. He has not had one in over a year, but has a denture which would need to be removed if a seizure occurs.   OBJECTIVE IMPAIRMENTS: Abnormal gait, decreased activity tolerance, decreased mobility, difficulty walking, decreased ROM, decreased strength, dizziness, increased muscle spasms, impaired flexibility, postural dysfunction, and pain.   ACTIVITY LIMITATIONS: carrying, lifting, bending, sitting, standing, squatting, sleeping, stairs, transfers, bed mobility, bathing, dressing, hygiene/grooming, and locomotion level  PARTICIPATION LIMITATIONS: meal prep, cleaning, laundry, driving, shopping, community activity, yard work, and golf  PERSONAL FACTORS: Age and 3+ comorbidities:  OA, leg cramps, heart issues are also affecting patient's functional outcome.   REHAB POTENTIAL: Good  CLINICAL DECISION MAKING: Evolving/moderate complexity  EVALUATION COMPLEXITY: Moderate   GOALS: Goals reviewed with patient? Yes  SHORT TERM GOALS: Target date: 02/04/2024   Patient will be independent with initial HEP.  Baseline:  Goal status: INITIAL  2.  Patient will  report centralization of radicular symptoms.  Baseline:  Goal status: INITIAL  3.  Decreased back pain by 30% to improve QOL. Baseline:  Goal status: INITIAL  4.  Patient able to sleep without waking from back pain Baseline:  Goal status: INITIAL    LONG TERM GOALS: Target date: 03/03/2024   Patient will be independent with advanced/ongoing HEP to improve outcomes and carryover.  Baseline:  Goal status: INITIAL  72.  Patient will report 75% improvement in low back pain with ADLs to improve QOL.  Baseline:  Goal status: INITIAL  3.  Patient will demonstrate functional lumbar flexion to perform ADLs and return to golf   Baseline:  Goal status: INITIAL  4.  Patient will be able to perform 5xSTS in less than 20 seconds showing functional improvement in strength without increased pain Baseline:  Goal status: INITIAL  5.  Patient will 18 or less on the Modified Oswestry demonstrating improved functional ability.  Baseline: 24 Goal status: INITIAL    PLAN:  PT FREQUENCY: 2x/week  PT DURATION: 8 weeks  PLANNED INTERVENTIONS: 97110-Therapeutic exercises, 97530- Therapeutic activity, 97112- Neuromuscular re-education, 97535- Self Care, [id number]- Manual therapy, [phone]- Gait training, [phone]- Aquatic Therapy, [phone]- Traction (mechanical), [phone]- Ionotophoresis 4mg /ml Dexamethasone, 79439 (1-2 muscles), [phone] (3+ muscles)- Dry Needling, Patient/Family education, Balance training, Stair training, Taping, Joint mobilization, Spinal mobilization, Cryotherapy, and Moist heat.  PLAN FOR NEXT SESSION:  PRECAUTION: SEIZURES AND HAS DENTURES (NO ESTIM), Review and progress HEP, ROM, strengthening,  manual/DN to lumbar spine, spinal mobs, body mechanics and ADL modifications, traction?    Mliss Cummins, PT 01/20/24 1:33 PM

## 2024-01-20 ENCOUNTER — Encounter: Payer: Self-pay | Admitting: Physical Therapy

## 2024-01-20 ENCOUNTER — Ambulatory Visit: Admitting: Physical Therapy

## 2024-01-20 DIAGNOSIS — M5459 Other low back pain: Secondary | ICD-10-CM | POA: Diagnosis not present

## 2024-01-20 DIAGNOSIS — R252 Cramp and spasm: Secondary | ICD-10-CM

## 2024-01-26 ENCOUNTER — Ambulatory Visit

## 2024-01-28 ENCOUNTER — Other Ambulatory Visit: Payer: Self-pay

## 2024-01-28 DIAGNOSIS — Z7901 Long term (current) use of anticoagulants: Secondary | ICD-10-CM

## 2024-01-28 MED ORDER — RIVAROXABAN 20 MG PO TABS: 20.0000 mg | ORAL_TABLET | Freq: Every day | ORAL | 0 refills | Status: DC

## 2024-01-28 NOTE — Telephone Encounter (Signed)
 Prescription refill request for Xarelto  received.  Indication:afib Last office visit:9/25 Weight:72.9  kg Age:83 Drm:wzzi labs CrCl:  Prescription refilled

## 2024-01-29 ENCOUNTER — Ambulatory Visit: Admitting: Physical Therapy

## 2024-01-29 ENCOUNTER — Encounter: Payer: Self-pay | Admitting: Physical Therapy

## 2024-01-29 DIAGNOSIS — R252 Cramp and spasm: Secondary | ICD-10-CM | POA: Diagnosis present

## 2024-01-29 DIAGNOSIS — M5459 Other low back pain: Secondary | ICD-10-CM | POA: Insufficient documentation

## 2024-01-29 NOTE — Therapy (Signed)
 OUTPATIENT PHYSICAL THERAPY THORACOLUMBAR TREATMENT   Patient Name: Drew Smith MRN: [id number] DOB:Nov 03, 1940, 83 y.o., male Today's Date: 01/29/2024  END OF SESSION:  PT End of Session - 01/29/24 1705     Visit Number 4    Date for Recertification  03/03/24    Authorization Type HTA - no auth reqd; med nec    Progress Note Due on Visit 10    PT Start Time 1617    PT Stop Time 1700    PT Time Calculation (min) 43 min    Activity Tolerance Patient tolerated treatment well    Behavior During Therapy WFL for tasks assessed/performed             Past Medical History:  Diagnosis Date   Arthritis    Asthma    Cancer (HCC)    prostate   GERD (gastroesophageal reflux disease)    Guillain-Barre syndrome 02/25/1998   Hyperlipidemia    Hypertension    Seizures (HCC)    Past Surgical History:  Procedure Laterality Date   CATARACT EXTRACTION     EYE SURGERY  02/26/1943   83 years old   HERNIA REPAIR  02/25/1993   NOSE SURGERY  02/26/1988   PROSTATE SURGERY  02/25/1998   seed inplants   SHOULDER SURGERY  02/25/2006   Patient Active Problem List   Diagnosis Date Noted   Hypertrophic cardiomyopathy (HCC) 04/01/2023   Actinic keratosis 01/02/2022   Hypokalemia 05/06/2021   CHB (complete heart block) (HCC) 05/05/2021   Syncope 05/04/2021   Paroxysmal atrial fibrillation (HCC) 05/04/2021   History of seizures 05/04/2021   Malignant melanoma of left forearm (HCC) 10/19/2019   Upper respiratory tract infection 02/14/2017   Porokeratosis 09/07/2014   Metatarsal deformity 09/07/2014   Pain in lower limb 09/07/2014   Squamous cell carcinoma in situ of skin 10/15/2013   Hives 10/06/2012   History of Guillain-Barre syndrome 03/07/2011   Hypertension 11/28/2010   Dyslipidemia 11/28/2010   ERECTILE DYSFUNCTION 01/05/2008   Asthma 10/28/2007   GERD 10/28/2007   DIZZINESS 10/28/2007   COUGH 10/28/2007   FASTING HYPERGLYCEMIA 10/28/2007   PROSTATE CANCER, HX OF  10/28/2007    PCP: Micheal Wolm ORN, MD   REFERRING PROVIDER: Micheal Wolm ORN, MD   REFERRING DIAG: M54.50 (ICD-10-CM) - Lumbar pain   Rationale for Evaluation and Treatment: Rehabilitation  THERAPY DIAG:  Other low back pain  Cramp and spasm  ONSET DATE: 2 weeks ago  SUBJECTIVE:  SUBJECTIVE STATEMENT: No pain currently as long as I don't do anything too intense. I did yard work today.  EVAL: Plays a lot of golf. I try to do a lot of exercises and stretch. Sees a chiropractor, saw Monday and today and it helped. It was a lot of stretching.  I can't sleep or walk right. I went to [facility] on Monday and they were very aggressive with me. Yesterday I was in a lot of pain. A little better today, but still bad.  PERTINENT HISTORY:   Fatty heart, OA, HTN, seizures (been over a year - takes meds), prostate CA, leg cramps  PAIN:  01/29/2024 Yes, 2-3/10 pain in R hip to greater trochanter  Eval: Are you having pain? Yes: NPRS scale: 2-3/10 Pain location: low back right side shoots down right leg to foot and foot feels numb Pain description: dull sitting, sharp when standing Aggravating factors: sit to stand,  standing and walking Relieving factors: Tylenol  PRECAUTIONS: Other: SEIZURES - NO ESTIM  RED FLAGS: None   WEIGHT BEARING RESTRICTIONS: No  FALLS:  Has patient fallen in last 6 months? No  LIVING ENVIRONMENT: Lives with: lives alone Lives in: House/apartment Stairs: Yes: External: 2 steps; none Has following equipment at home: None  OCCUPATION: retired  PLOF: Independent  PATIENT GOALS: back to golf  NEXT MD VISIT: one week  OBJECTIVE:  Note: Objective measures were completed at Evaluation unless otherwise noted.  DIAGNOSTIC FINDINGS:  2022  MRI Lumbar 1. Severe  L4-5 facet arthrosis with grade 1 anterolisthesis and severe spinal stenosis. 2. Mild spinal stenosis and mild-to-moderate lateral recess stenosis at L3-4.  PATIENT SURVEYS:  Modified Oswestry:  MODIFIED OSWESTRY DISABILITY SCALE  Date: 01/07/24 Score  Pain intensity 3 =  Pain medication provides me with moderate relief from pain.  2. Personal care (washing, dressing, etc.) 2 =  It is painful to take care of myself, and I am slow and careful.  3. Lifting 2 = Pain prevents me from lifting heavy weights off the floor,but I can manage if the weights are conveniently positioned (e.g. on a table)  4. Walking 3 =  Pain prevents me from walking more than  mile.  5. Sitting 2 =  Pain prevents me from sitting more than 1 hour.  6. Standing 3 =  Pain prevents me from standing more than 1/2 hour.  7. Sleeping 3 =  Even when I take pain medication, I sleep less than 4 hours.  8. Social Life 2 = Pain prevents me from participating in more energetic activities (eg. sports, dancing).  9. Traveling 2 =  My pain restricts my travel over 2 hours.  10. Employment/ Homemaking 2 = I can perform most of my homemaking/job duties, but pain prevents me from performing more physically stressful activities (eg, lifting, vacuuming).  Total 24/50   Interpretation of scores: Score Category Description  0-20% Minimal Disability The patient can cope with most living activities. Usually no treatment is indicated apart from advice on lifting, sitting and exercise  21-40% Moderate Disability The patient experiences more pain and difficulty with sitting, lifting and standing. Travel and social life are more difficult and they may be disabled from work. Personal care, sexual activity and sleeping are not grossly affected, and the patient can usually be managed by conservative means  41-60% Severe Disability Pain remains the main problem in this group, but activities of daily living are affected. These patients require a  detailed investigation  61-80% Crippled  Back pain impinges on all aspects of the patient's life. Positive intervention is required  81-100% Bed-bound These patients are either bed-bound or exaggerating their symptoms  Bluford FORBES Zoe DELENA Karon DELENA, et al. Surgery versus conservative management of stable thoracolumbar fracture: the PRESTO feasibility RCT. [place] ([place]): [employer]; 2021 Nov. [facility] Technology Assessment, No. 25.62.) Appendix 3, Oswestry Disability Index category descriptors. Available from: Findjewelers.cz  Minimally Clinically Important Difference (MCID) = 12.8%  COGNITION: Overall cognitive status: Within functional limits for tasks assessed     SENSATION: Tingling in both feet x 1-2 yrs  MUSCLE LENGTH:   POSTURE: rounded shoulders, forward head, decreased lumbar lordosis, increased thoracic kyphosis, posterior pelvic tilt, and flexed trunk   PALPATION: R lower lumbar and near PSIS  LUMBAR ROM: * pain  AROM eval  Flexion 25%*  Extension 25%  Right lateral flexion WFL*  Left lateral flexion WFL  Right rotation 50%  Left rotation 50%   (Blank rows = not tested)  LOWER EXTREMITY ROM:   WFL for tasks assessed  LOWER EXTREMITY MMT:  *pain with resisted R hip flexion Grossly 4+/5 to 5/5 in B hips, knees and ankle DF   LUMBAR SPECIAL TESTS:  Straight leg raise test: Positive and Quadrant test: Positive  FUNCTIONAL TESTS:  5 times sit to stand: 32.86 sec 3-5/10 pain  GAIT: Distance walked: 20 Comments: antalgic, decreased stance time R  TREATMENT DATE:                                                                                                                               01/29/24 Nustep L4 x 6 min Seated hip IR/ER x 10 each direction Seated LAQ  with neck flexion for nerve glide x 10 Rt Forward T x 8 each side Side stepping with red loop x 4 laps at counter Weighted golf swing 3# DB x 10 Pallof press  green with TB x 12 each direction Standing trunk rotation with green TB x 12 each direction Standing chop with green TB x 12 each direction Education on patient's sleep position & use of pillows in between knees    01/20/24 Nustep L5 x 6 min Seated fig 4  x 30 sec  Seated hip IR/ER x 20  Seated up and over hurdle x 10 ea Standing up and over hurdle x 10 ea SL toe taps to cones x 5 ea side Diver to table x 10 ea side - harder on R and pain in R low back Seated LAQ  with neck flexion for nerve glide x 10 R Pallof press green with dowel 2x10 Standing chops 3# x 10 B at [place]    01/14/24 Nustep L5 x 6 min Seated fig 4 3 x 30 sec  Seated LAQ x 10 B LTR x 10 B Bridge x 10 SKTC 20 sec hold x 3 B Seated chops with ball x 10 B Seated ball lift knees to [name]  x 10 MFR with ball to R QL and gluteals Manual: TPR to R QL followed by IASTM with [place]aday to R lumbar and gluteals   01/07/24   See pt ed and HEP    PATIENT EDUCATION:  Education details: PT eval findings, anticipated POC, initial HEP, HEP review, postural awareness, posture and body mechanics for typical daily postioning, mobility and household tasks, role of TPDN, fee schedule for TPDN and lack of insurance coverage requiring payment at time of service, and discussion of limiting twisting exercises and any exercise that causes increased pain at this time, discussion of his seizures   Person educated: Patient Education method: Explanation, Demonstration, Tactile cues, Verbal cues, and Handouts Education comprehension: verbalized understanding and returned demonstration  HOME EXERCISE PROGRAM: Access Code: [email or web address] URL: [email or web address] Date: 01/14/2024 Prepared by: Mliss  Exercises - Supine Bridge  - 2 x daily - 7 x weekly - 1-3 sets - 10 reps - Supine Lower Trunk Rotation  - 2 x daily - 7 x weekly - 1 sets - 5 reps - 10 sec hold - Seated Long Arc Quad  - 1 x daily - 7 x weekly - 2 sets -  10 reps - 5 sec hold - Seated Figure 4 Piriformis Stretch  - 2 x daily - 7 x weekly - 1 sets - 3 reps - 30-60 sec hold - Supine Figure 4 Piriformis Stretch  - 1 x daily - 7 x weekly - 1 sets - 3 reps - 30-60 sec hold - Hooklying Single Knee to Chest Stretch  - 1 x daily - 3 x weekly - 1 sets - 3 reps - 20 sec hold  ASSESSMENT:  CLINICAL IMPRESSION: Patient presents with no pain currently. He has been compliant with HEP and has been able to do yard work with minimal difficulty. With seated hip IR/ER patient verbalized some numbness in legs. This responded well to sciatic nerve glides. Incorporated more rotation and anti-rotational movements for patient's golf hobby. He enjoyed these exercises. Reviewed patient's sleeping position and educated him on the benefit of adding a pillow under his knees since he likes to sleep on his side with his top leg flexed slightly. He verbalized a decrease in low back pain and tightness with the addition of the pillows. Overall, patient is progressing well with skilled therapy and feels he is progressing well.   EVAL: Patient is a 83 y.o. male who was seen today for physical therapy evaluation and treatment for acute LBP starting 2 weeks ago. He reports marked pain with sit to stand transfers, bed mobility, sitting, standing and walking. His pain affects all ADLs including sleep. He also cannot golf which is his main goal. He has pain with lumbar flexion and R SB, pain with resisted hip flexion. He has pain with B SLR. He has a h/o of severe L4/5 facet arthrosis and spinal stenosis. He is very active and would like to return to his PLOF. Pt will benefit from skilled PT to address these deficits and those listed below. He has a history of seizures and is on med to control these. He has not had one in over a year, but has a denture which would need to be removed if a seizure occurs.   OBJECTIVE IMPAIRMENTS: Abnormal gait, decreased activity tolerance, decreased mobility,  difficulty walking, decreased ROM, decreased strength, dizziness, increased muscle spasms, impaired flexibility, postural dysfunction, and pain.   ACTIVITY LIMITATIONS: carrying, lifting, bending, sitting, standing, squatting, sleeping, stairs, transfers, bed mobility, bathing,  dressing, hygiene/grooming, and locomotion level  PARTICIPATION LIMITATIONS: meal prep, cleaning, laundry, driving, shopping, community activity, yard work, and golf  PERSONAL FACTORS: Age and 3+ comorbidities: OA, leg cramps, heart issues are also affecting patient's functional outcome.   REHAB POTENTIAL: Good  CLINICAL DECISION MAKING: Evolving/moderate complexity  EVALUATION COMPLEXITY: Moderate   GOALS: Goals reviewed with patient? Yes  SHORT TERM GOALS: Target date: 02/04/2024   Patient will be independent with initial HEP.  Baseline:  Goal status: INITIAL  2.  Patient will report centralization of radicular symptoms.  Baseline:  Goal status: INITIAL  3.  Decreased back pain by 30% to improve QOL. Baseline:  Goal status: INITIAL  4.  Patient able to sleep without waking from back pain Baseline:  Goal status: INITIAL    LONG TERM GOALS: Target date: 03/03/2024   Patient will be independent with advanced/ongoing HEP to improve outcomes and carryover.  Baseline:  Goal status: INITIAL  72.  Patient will report 75% improvement in low back pain with ADLs to improve QOL.  Baseline:  Goal status: INITIAL  3.  Patient will demonstrate functional lumbar flexion to perform ADLs and return to golf   Baseline:  Goal status: INITIAL  4.  Patient will be able to perform 5xSTS in less than 20 seconds showing functional improvement in strength without increased pain Baseline:  Goal status: INITIAL  5.  Patient will 18 or less on the Modified Oswestry demonstrating improved functional ability.  Baseline: 24 Goal status: INITIAL    PLAN:  PT FREQUENCY: 2x/week  PT DURATION: 8 weeks  PLANNED  INTERVENTIONS: 97110-Therapeutic exercises, 97530- Therapeutic activity, 97112- Neuromuscular re-education, 97535- Self Care, [id number]- Manual therapy, [phone]- Gait training, [phone]- Aquatic Therapy, [phone]- Traction (mechanical), [phone]- Ionotophoresis 4mg /ml Dexamethasone, [id number] (1-2 muscles), [id number] (3+ muscles)- Dry Needling, Patient/Family education, Balance training, Stair training, Taping, Joint mobilization, Spinal mobilization, Cryotherapy, and Moist heat.  PLAN FOR NEXT SESSION: PRECAUTION: SEIZURES AND HAS DENTURES (NO ESTIM), Review and progress HEP, ROM, strengthening,  manual/DN to lumbar spine, spinal mobs, body mechanics and ADL modifications, traction?    Kristeen Sar, PT, DPT 01/29/24 5:06 PM

## 2024-02-03 ENCOUNTER — Encounter: Payer: Self-pay | Admitting: Physical Therapy

## 2024-02-03 ENCOUNTER — Ambulatory Visit: Admitting: Podiatry

## 2024-02-03 ENCOUNTER — Ambulatory Visit: Admitting: Physical Therapy

## 2024-02-03 DIAGNOSIS — B351 Tinea unguium: Secondary | ICD-10-CM

## 2024-02-03 DIAGNOSIS — R252 Cramp and spasm: Secondary | ICD-10-CM

## 2024-02-03 DIAGNOSIS — M5459 Other low back pain: Secondary | ICD-10-CM

## 2024-02-03 DIAGNOSIS — M79671 Pain in right foot: Secondary | ICD-10-CM

## 2024-02-03 DIAGNOSIS — M79672 Pain in left foot: Secondary | ICD-10-CM | POA: Diagnosis not present

## 2024-02-03 MED ORDER — KETOCONAZOLE 2 % EX CREA: TOPICAL_CREAM | Freq: Two times a day (BID) | CUTANEOUS | 2 refills | Status: AC

## 2024-02-03 NOTE — Therapy (Signed)
 OUTPATIENT PHYSICAL THERAPY THORACOLUMBAR TREATMENT   Patient Name: Drew Smith MRN: [id number] DOB:1940-10-25, 83 y.o., male Today's Date: 02/03/2024  END OF SESSION:  PT End of Session - 02/03/24 1659     Visit Number 5    Date for Recertification  03/03/24    Authorization Type HTA - no auth reqd; med nec    Progress Note Due on Visit 10    PT Start Time 1618    PT Stop Time 1658    PT Time Calculation (min) 40 min    Activity Tolerance Patient tolerated treatment well    Behavior During Therapy WFL for tasks assessed/performed              Past Medical History:  Diagnosis Date   Arthritis    Asthma    Cancer (HCC)    prostate   GERD (gastroesophageal reflux disease)    Guillain-Barre syndrome 02/25/1998   Hyperlipidemia    Hypertension    Seizures (HCC)    Past Surgical History:  Procedure Laterality Date   CATARACT EXTRACTION     EYE SURGERY  02/26/1943   83 years old   HERNIA REPAIR  02/25/1993   NOSE SURGERY  02/26/1988   PROSTATE SURGERY  02/25/1998   seed inplants   SHOULDER SURGERY  02/25/2006   Patient Active Problem List   Diagnosis Date Noted   Hypertrophic cardiomyopathy (HCC) 04/01/2023   Actinic keratosis 01/02/2022   Hypokalemia 05/06/2021   CHB (complete heart block) (HCC) 05/05/2021   Syncope 05/04/2021   Paroxysmal atrial fibrillation (HCC) 05/04/2021   History of seizures 05/04/2021   Malignant melanoma of left forearm (HCC) 10/19/2019   Upper respiratory tract infection 02/14/2017   Porokeratosis 09/07/2014   Metatarsal deformity 09/07/2014   Pain in lower limb 09/07/2014   Squamous cell carcinoma in situ of skin 10/15/2013   Hives 10/06/2012   History of Guillain-Barre syndrome 03/07/2011   Hypertension 11/28/2010   Dyslipidemia 11/28/2010   ERECTILE DYSFUNCTION 01/05/2008   Asthma 10/28/2007   GERD 10/28/2007   DIZZINESS 10/28/2007   COUGH 10/28/2007   FASTING HYPERGLYCEMIA 10/28/2007   PROSTATE CANCER, HX OF  10/28/2007    PCP: Micheal Wolm ORN, MD   REFERRING PROVIDER: Micheal Wolm ORN, MD   REFERRING DIAG: M54.50 (ICD-10-CM) - Lumbar pain   Rationale for Evaluation and Treatment: Rehabilitation  THERAPY DIAG:  Other low back pain  Cramp and spasm  ONSET DATE: 2 weeks ago  SUBJECTIVE:  SUBJECTIVE STATEMENT: Patient reports increased knee pain after running a lot of errands. He felt good after last session no increased pain.   EVAL: Plays a lot of golf. I try to do a lot of exercises and stretch. Sees a chiropractor, saw Monday and today and it helped. It was a lot of stretching.  I can't sleep or walk right. I went to [facility] on Monday and they were very aggressive with me. Yesterday I was in a lot of pain. A little better today, but still bad.  PERTINENT HISTORY:   Fatty heart, OA, HTN, seizures (been over a year - takes meds), prostate CA, leg cramps  PAIN:  02/03/2024 Yes, 2-3/10 pain in R hip to greater trochanter  Eval: Are you having pain? Yes: NPRS scale: 2-3/10 Pain location: low back right side shoots down right leg to foot and foot feels numb Pain description: dull sitting, sharp when standing Aggravating factors: sit to stand,  standing and walking Relieving factors: Tylenol  PRECAUTIONS: Other: SEIZURES - NO ESTIM  RED FLAGS: None   WEIGHT BEARING RESTRICTIONS: No  FALLS:  Has patient fallen in last 6 months? No  LIVING ENVIRONMENT: Lives with: lives alone Lives in: House/apartment Stairs: Yes: External: 2 steps; none Has following equipment at home: None  OCCUPATION: retired  PLOF: Independent  PATIENT GOALS: back to golf  NEXT MD VISIT: one week  OBJECTIVE:  Note: Objective measures were completed at Evaluation unless otherwise noted.  DIAGNOSTIC  FINDINGS:  2022  MRI Lumbar 1. Severe L4-5 facet arthrosis with grade 1 anterolisthesis and severe spinal stenosis. 2. Mild spinal stenosis and mild-to-moderate lateral recess stenosis at L3-4.  PATIENT SURVEYS:  Modified Oswestry:  MODIFIED OSWESTRY DISABILITY SCALE  Date: 01/07/24 Score  Pain intensity 3 =  Pain medication provides me with moderate relief from pain.  2. Personal care (washing, dressing, etc.) 2 =  It is painful to take care of myself, and I am slow and careful.  3. Lifting 2 = Pain prevents me from lifting heavy weights off the floor,but I can manage if the weights are conveniently positioned (e.g. on a table)  4. Walking 3 =  Pain prevents me from walking more than  mile.  5. Sitting 2 =  Pain prevents me from sitting more than 1 hour.  6. Standing 3 =  Pain prevents me from standing more than 1/2 hour.  7. Sleeping 3 =  Even when I take pain medication, I sleep less than 4 hours.  8. Social Life 2 = Pain prevents me from participating in more energetic activities (eg. sports, dancing).  9. Traveling 2 =  My pain restricts my travel over 2 hours.  10. Employment/ Homemaking 2 = I can perform most of my homemaking/job duties, but pain prevents me from performing more physically stressful activities (eg, lifting, vacuuming).  Total 24/50   Interpretation of scores: Score Category Description  0-20% Minimal Disability The patient can cope with most living activities. Usually no treatment is indicated apart from advice on lifting, sitting and exercise  21-40% Moderate Disability The patient experiences more pain and difficulty with sitting, lifting and standing. Travel and social life are more difficult and they may be disabled from work. Personal care, sexual activity and sleeping are not grossly affected, and the patient can usually be managed by conservative means  41-60% Severe Disability Pain remains the main problem in this group, but activities of daily living are  affected. These patients require a detailed  investigation  61-80% Crippled Back pain impinges on all aspects of the patient's life. Positive intervention is required  81-100% Bed-bound These patients are either bed-bound or exaggerating their symptoms  Bluford FORBES Zoe DELENA Karon DELENA, et al. Surgery versus conservative management of stable thoracolumbar fracture: the PRESTO feasibility RCT. [place] ([place]): [employer]; 2021 Nov. [facility] Technology Assessment, No. 25.62.) Appendix 3, Oswestry Disability Index category descriptors. Available from: Findjewelers.cz  Minimally Clinically Important Difference (MCID) = 12.8%  COGNITION: Overall cognitive status: Within functional limits for tasks assessed     SENSATION: Tingling in both feet x 1-2 yrs  MUSCLE LENGTH:   POSTURE: rounded shoulders, forward head, decreased lumbar lordosis, increased thoracic kyphosis, posterior pelvic tilt, and flexed trunk   PALPATION: R lower lumbar and near PSIS  LUMBAR ROM: * pain  AROM eval  Flexion 25%*  Extension 25%  Right lateral flexion WFL*  Left lateral flexion WFL  Right rotation 50%  Left rotation 50%   (Blank rows = not tested)  LOWER EXTREMITY ROM:   WFL for tasks assessed  LOWER EXTREMITY MMT:  *pain with resisted R hip flexion Grossly 4+/5 to 5/5 in B hips, knees and ankle DF   LUMBAR SPECIAL TESTS:  Straight leg raise test: Positive and Quadrant test: Positive  FUNCTIONAL TESTS:  5 times sit to stand: 32.86 sec 3-5/10 pain  GAIT: Distance walked: 20 Comments: antalgic, decreased stance time R  TREATMENT DATE:                                                                                                                               02/03/24 Nustep L4 x 7 min Forward T hand supported on counter x 5 Sit to stand holding 6# DB x 10 6# DB shoulder height + march x 10 each bilateral  Seated hinge with 6# DB x 10 Seated ear to hip with  6# BD x 10 each side Pallof press green with TB x 20 each direction Standing trunk rotation with green TB x 15 each direction     01/29/24 Nustep L4 x 6 min Seated hip IR/ER x 10 each direction Seated LAQ  with neck flexion for nerve glide x 10 Rt Forward T x 8 each side Side stepping with red loop x 4 laps at counter Weighted golf swing 3# DB x 10 Pallof press green with TB x 12 each direction Standing trunk rotation with green TB x 12 each direction Standing chop with green TB x 12 each direction Education on patient's sleep position & use of pillows in between knees    01/20/24 Nustep L5 x 6 min Seated fig 4  x 30 sec  Seated hip IR/ER x 20  Seated up and over hurdle x 10 ea Standing up and over hurdle x 10 ea SL toe taps to cones x 5 ea side Diver to table x 10 ea side - harder on R and pain in R  low back Seated LAQ  with neck flexion for nerve glide x 10 R Pallof press green with dowel 2x10 Standing chops 3# x 10 B at [place]    01/14/24 Nustep L5 x 6 min Seated fig 4 3 x 30 sec  Seated LAQ x 10 B LTR x 10 B Bridge x 10 SKTC 20 sec hold x 3 B Seated chops with ball x 10 B Seated ball lift knees to OH x 10 MFR with ball to R QL and gluteals Manual: TPR to R QL followed by IASTM with Addaday to R lumbar and gluteals    PATIENT EDUCATION:  Education details: PT eval findings, anticipated POC, initial HEP, HEP review, postural awareness, posture and body mechanics for typical daily postioning, mobility and household tasks, role of TPDN, fee schedule for TPDN and lack of insurance coverage requiring payment at time of service, and discussion of limiting twisting exercises and any exercise that causes increased pain at this time, discussion of his seizures   Person educated: Patient Education method: Explanation, Demonstration, Tactile cues, Verbal cues, and Handouts Education comprehension: verbalized understanding and returned demonstration  HOME  EXERCISE PROGRAM: Access Code: [email or web address] URL: [email or web address] Date: 01/14/2024 Prepared by: Mliss  Exercises - Supine Bridge  - 2 x daily - 7 x weekly - 1-3 sets - 10 reps - Supine Lower Trunk Rotation  - 2 x daily - 7 x weekly - 1 sets - 5 reps - 10 sec hold - Seated Long Arc Quad  - 1 x daily - 7 x weekly - 2 sets - 10 reps - 5 sec hold - Seated Figure 4 Piriformis Stretch  - 2 x daily - 7 x weekly - 1 sets - 3 reps - 30-60 sec hold - Supine Figure 4 Piriformis Stretch  - 1 x daily - 7 x weekly - 1 sets - 3 reps - 30-60 sec hold - Hooklying Single Knee to Chest Stretch  - 1 x daily - 3 x weekly - 1 sets - 3 reps - 20 sec hold  ASSESSMENT:  CLINICAL IMPRESSION: Patient presents with increased knee pain after having a busy day of running errands. He did not verbalize any increased pain or discomfort after last treatment session. He has been compliant with HEP. Reviewed forward T exercise to make sure proper performance. Treatment session focused on core and functional strengthening to help patient's ability to play golf and dance. PT monitored patient response throughout and provided verbal and visual cues as needed.   EVAL: Patient is a 83 y.o. male who was seen today for physical therapy evaluation and treatment for acute LBP starting 2 weeks ago. He reports marked pain with sit to stand transfers, bed mobility, sitting, standing and walking. His pain affects all ADLs including sleep. He also cannot golf which is his main goal. He has pain with lumbar flexion and R SB, pain with resisted hip flexion. He has pain with B SLR. He has a h/o of severe L4/5 facet arthrosis and spinal stenosis. He is very active and would like to return to his PLOF. Pt will benefit from skilled PT to address these deficits and those listed below. He has a history of seizures and is on med to control these. He has not had one in over a year, but has a denture which would need to be removed if a  seizure occurs.   OBJECTIVE IMPAIRMENTS: Abnormal gait, decreased activity tolerance, decreased mobility, difficulty walking, decreased ROM, decreased strength, dizziness,  increased muscle spasms, impaired flexibility, postural dysfunction, and pain.   ACTIVITY LIMITATIONS: carrying, lifting, bending, sitting, standing, squatting, sleeping, stairs, transfers, bed mobility, bathing, dressing, hygiene/grooming, and locomotion level  PARTICIPATION LIMITATIONS: meal prep, cleaning, laundry, driving, shopping, community activity, yard work, and golf  PERSONAL FACTORS: Age and 3+ comorbidities: OA, leg cramps, heart issues are also affecting patient's functional outcome.   REHAB POTENTIAL: Good  CLINICAL DECISION MAKING: Evolving/moderate complexity  EVALUATION COMPLEXITY: Moderate   GOALS: Goals reviewed with patient? Yes  SHORT TERM GOALS: Target date: 02/04/2024   Patient will be independent with initial HEP.  Baseline:  Goal status: INITIAL  2.  Patient will report centralization of radicular symptoms.  Baseline:  Goal status: INITIAL  3.  Decreased back pain by 30% to improve QOL. Baseline:  Goal status: INITIAL  4.  Patient able to sleep without waking from back pain Baseline:  Goal status: INITIAL    LONG TERM GOALS: Target date: 03/03/2024   Patient will be independent with advanced/ongoing HEP to improve outcomes and carryover.  Baseline:  Goal status: INITIAL  72.  Patient will report 75% improvement in low back pain with ADLs to improve QOL.  Baseline:  Goal status: INITIAL  3.  Patient will demonstrate functional lumbar flexion to perform ADLs and return to golf   Baseline:  Goal status: INITIAL  4.  Patient will be able to perform 5xSTS in less than 20 seconds showing functional improvement in strength without increased pain Baseline:  Goal status: INITIAL  5.  Patient will 18 or less on the Modified Oswestry demonstrating improved functional ability.   Baseline: 24 Goal status: INITIAL    PLAN:  PT FREQUENCY: 2x/week  PT DURATION: 8 weeks  PLANNED INTERVENTIONS: 97110-Therapeutic exercises, 97530- Therapeutic activity, 97112- Neuromuscular re-education, 97535- Self Care, [id number]- Manual therapy, [phone]- Gait training, [phone]- Aquatic Therapy, [phone]- Traction (mechanical), [phone]- Ionotophoresis 4mg /ml Dexamethasone, 79439 (1-2 muscles), [phone] (3+ muscles)- Dry Needling, Patient/Family education, Balance training, Stair training, Taping, Joint mobilization, Spinal mobilization, Cryotherapy, and Moist heat.  PLAN FOR NEXT SESSION: PRECAUTION: SEIZURES AND HAS DENTURES (NO ESTIM), Review and progress HEP, ROM, strengthening,  manual/DN to lumbar spine, spinal mobs, body mechanics and ADL modifications, traction?    Kristeen Sar, PT, DPT 02/03/24 4:59 PM

## 2024-02-03 NOTE — Progress Notes (Signed)
 Patient presents for evaluation and treatment of tenderness and some redness around nails feet.  Tenderness around toes with walking and wearing shoes.  Physical exam:  General appearance: Alert, pleasant, and in no acute distress.  Vascular: Pedal pulses: DP 2/4 B/L, PT 0/4 B/L. Mild edema lower legs bilaterally.  Capillary refill time immediate bilaterally  Neurologic:  Dermatologic:  Nails thickened, disfigured, discolored 1-5 BL with subungual debris.  Redness and hypertrophic nail folds along nail folds bilaterally but no signs of drainage or infection.  Musculoskeletal:     Diagnosis: 1. Painful onychomycotic nails 1 through 5 bilaterally. 2. Pain toes 1 through 5 bilaterally.  Plan: -Debrided onychomycotic nails 1 through 5 bilaterally.  Sharply debrided nails with nail clipper and reduced with a power bur.  Return 3 months [facility]

## 2024-02-05 ENCOUNTER — Ambulatory Visit: Admitting: Physical Therapy

## 2024-02-10 ENCOUNTER — Ambulatory Visit: Admitting: Physical Therapy

## 2024-02-10 ENCOUNTER — Encounter: Payer: Self-pay | Admitting: Physical Therapy

## 2024-02-10 DIAGNOSIS — M5459 Other low back pain: Secondary | ICD-10-CM

## 2024-02-10 DIAGNOSIS — R252 Cramp and spasm: Secondary | ICD-10-CM

## 2024-02-10 NOTE — Therapy (Signed)
 OUTPATIENT PHYSICAL THERAPY THORACOLUMBAR TREATMENT   Patient Name: Drew Smith MRN: [id number] DOB:1940-12-16, 83 y.o., male Today's Date: 02/10/2024  END OF SESSION:  PT End of Session - 02/10/24 1531     Visit Number 6    Date for Recertification  03/03/24    Authorization Type HTA - no auth reqd; med nec    Progress Note Due on Visit 10    PT Start Time 1449    PT Stop Time 1530    PT Time Calculation (min) 41 min    Activity Tolerance Patient tolerated treatment well    Behavior During Therapy WFL for tasks assessed/performed               Past Medical History:  Diagnosis Date   Arthritis    Asthma    Cancer (HCC)    prostate   GERD (gastroesophageal reflux disease)    Guillain-Barre syndrome 02/25/1998   Hyperlipidemia    Hypertension    Seizures (HCC)    Past Surgical History:  Procedure Laterality Date   CATARACT EXTRACTION     EYE SURGERY  02/26/1943   83 years old   HERNIA REPAIR  02/25/1993   NOSE SURGERY  02/26/1988   PROSTATE SURGERY  02/25/1998   seed inplants   SHOULDER SURGERY  02/25/2006   Patient Active Problem List   Diagnosis Date Noted   Hypertrophic cardiomyopathy (HCC) 04/01/2023   Actinic keratosis 01/02/2022   Hypokalemia 05/06/2021   CHB (complete heart block) (HCC) 05/05/2021   Syncope 05/04/2021   Paroxysmal atrial fibrillation (HCC) 05/04/2021   History of seizures 05/04/2021   Malignant melanoma of left forearm (HCC) 10/19/2019   Upper respiratory tract infection 02/14/2017   Porokeratosis 09/07/2014   Metatarsal deformity 09/07/2014   Pain in lower limb 09/07/2014   Squamous cell carcinoma in situ of skin 10/15/2013   Hives 10/06/2012   History of Guillain-Barre syndrome 03/07/2011   Hypertension 11/28/2010   Dyslipidemia 11/28/2010   ERECTILE DYSFUNCTION 01/05/2008   Asthma 10/28/2007   GERD 10/28/2007   DIZZINESS 10/28/2007   COUGH 10/28/2007   FASTING HYPERGLYCEMIA 10/28/2007   PROSTATE CANCER, HX OF  10/28/2007    PCP: Micheal Wolm ORN, MD   REFERRING PROVIDER: Micheal Wolm ORN, MD   REFERRING DIAG: M54.50 (ICD-10-CM) - Lumbar pain   Rationale for Evaluation and Treatment: Rehabilitation  THERAPY DIAG:  Other low back pain  Cramp and spasm  ONSET DATE: 2 weeks ago  SUBJECTIVE:  SUBJECTIVE STATEMENT: Patient reports he is doing good today. He is not currently having any pain. No pain after last session.  EVAL: Plays a lot of golf. I try to do a lot of exercises and stretch. Sees a chiropractor, saw Monday and today and it helped. It was a lot of stretching.  I can't sleep or walk right. I went to [facility] on Monday and they were very aggressive with me. Yesterday I was in a lot of pain. A little better today, but still bad.  PERTINENT HISTORY:   Fatty heart, OA, HTN, seizures (been over a year - takes meds), prostate CA, leg cramps  PAIN:  02/10/2024 Yes, 2-3/10 pain in R hip to greater trochanter  Eval: Are you having pain? Yes: NPRS scale: 2-3/10 Pain location: low back right side shoots down right leg to foot and foot feels numb Pain description: dull sitting, sharp when standing Aggravating factors: sit to stand,  standing and walking Relieving factors: Tylenol  PRECAUTIONS: Other: SEIZURES - NO ESTIM  RED FLAGS: None   WEIGHT BEARING RESTRICTIONS: No  FALLS:  Has patient fallen in last 6 months? No  LIVING ENVIRONMENT: Lives with: lives alone Lives in: House/apartment Stairs: Yes: External: 2 steps; none Has following equipment at home: None  OCCUPATION: retired  PLOF: Independent  PATIENT GOALS: back to golf  NEXT MD VISIT: one week  OBJECTIVE:  Note: Objective measures were completed at Evaluation unless otherwise noted.  DIAGNOSTIC FINDINGS:  2022   MRI Lumbar 1. Severe L4-5 facet arthrosis with grade 1 anterolisthesis and severe spinal stenosis. 2. Mild spinal stenosis and mild-to-moderate lateral recess stenosis at L3-4.  PATIENT SURVEYS:  Modified Oswestry:  MODIFIED OSWESTRY DISABILITY SCALE  Date: 01/07/24 Score  Pain intensity 3 =  Pain medication provides me with moderate relief from pain.  2. Personal care (washing, dressing, etc.) 2 =  It is painful to take care of myself, and I am slow and careful.  3. Lifting 2 = Pain prevents me from lifting heavy weights off the floor,but I can manage if the weights are conveniently positioned (e.g. on a table)  4. Walking 3 =  Pain prevents me from walking more than  mile.  5. Sitting 2 =  Pain prevents me from sitting more than 1 hour.  6. Standing 3 =  Pain prevents me from standing more than 1/2 hour.  7. Sleeping 3 =  Even when I take pain medication, I sleep less than 4 hours.  8. Social Life 2 = Pain prevents me from participating in more energetic activities (eg. sports, dancing).  9. Traveling 2 =  My pain restricts my travel over 2 hours.  10. Employment/ Homemaking 2 = I can perform most of my homemaking/job duties, but pain prevents me from performing more physically stressful activities (eg, lifting, vacuuming).  Total 24/50   Interpretation of scores: Score Category Description  0-20% Minimal Disability The patient can cope with most living activities. Usually no treatment is indicated apart from advice on lifting, sitting and exercise  21-40% Moderate Disability The patient experiences more pain and difficulty with sitting, lifting and standing. Travel and social life are more difficult and they may be disabled from work. Personal care, sexual activity and sleeping are not grossly affected, and the patient can usually be managed by conservative means  41-60% Severe Disability Pain remains the main problem in this group, but activities of daily living are affected. These  patients require a detailed investigation  61-80% Crippled Back pain impinges on all aspects of the patients life. Positive intervention is required  81-100% Bed-bound These patients are either bed-bound or exaggerating their symptoms  Drew Smith Drew Smith, et al. Surgery versus conservative management of stable thoracolumbar fracture: the PRESTO feasibility RCT. [place] ([place]): [employer]; 2021 Nov. [facility] Technology Assessment, No. 25.62.) Appendix 3, Oswestry Disability Index category descriptors. Available from: Findjewelers.cz  Minimally Clinically Important Difference (MCID) = 12.8%  COGNITION: Overall cognitive status: Within functional limits for tasks assessed     SENSATION: Tingling in both feet x 1-2 yrs  MUSCLE LENGTH:   POSTURE: rounded shoulders, forward head, decreased lumbar lordosis, increased thoracic kyphosis, posterior pelvic tilt, and flexed trunk   PALPATION: R lower lumbar and near PSIS  LUMBAR ROM: * pain  AROM eval  Flexion 25%*  Extension 25%  Right lateral flexion WFL*  Left lateral flexion WFL  Right rotation 50%  Left rotation 50%   (Blank rows = not tested)  LOWER EXTREMITY ROM:   WFL for tasks assessed  LOWER EXTREMITY MMT:  *pain with resisted R hip flexion Grossly 4+/5 to 5/5 in B hips, knees and ankle DF   LUMBAR SPECIAL TESTS:  Straight leg raise test: Positive and Quadrant test: Positive  FUNCTIONAL TESTS:  5 times sit to stand: 32.86 sec 3-5/10 pain  02/10/2024 5STS 15.62 sec 1/10 pain  GAIT: Distance walked: 20 Comments: antalgic, decreased stance time R  TREATMENT DATE:                                                                                                                               02/10/24 Nustep L4 x 7 min 5STS & goal assessment Hinge to the wall x 10 RDL with 4# DB 2 x 10 4# DB shoulder height + march x 20 Seated hip to hip 4# DB 2 x 10 Seated piriforms  stretch 2 x 20 sec bilateral  Green TB under Lt foot + rotation to right 2 x 10 Discussion of golf postures and rotations he performs Mini reverse lunge + march x 12 bilateral  Lateral lunge x 10 bilateral  Standing trunk rotation with green TB x 15 each direction     02/03/24 Nustep L4 x 7 min Forward T hand supported on counter x 5 Sit to stand holding 6# DB x 10 6# DB shoulder height + march x 10 each bilateral  Seated hinge with 6# DB x 10 Seated ear to hip with 6# BD x 10 each side Pallof press green with TB x 20 each direction Standing trunk rotation with green TB x 15 each direction     01/29/24 Nustep L4 x 6 min Seated hip IR/ER x 10 each direction Seated LAQ  with neck flexion for nerve glide x 10 Rt Forward T x 8 each side Side stepping with red loop x 4 laps at counter Weighted golf swing 3# DB x 10  Pallof press green with TB x 12 each direction Standing trunk rotation with green TB x 12 each direction Standing chop with green TB x 12 each direction Education on patient's sleep position & use of pillows in between knees    01/20/24 Nustep L5 x 6 min Seated fig 4  x 30 sec  Seated hip IR/ER x 20  Seated up and over hurdle x 10 ea Standing up and over hurdle x 10 ea SL toe taps to cones x 5 ea side Diver to table x 10 ea side - harder on R and pain in R low back Seated LAQ  with neck flexion for nerve glide x 10 R Pallof press green with dowel 2x10 Standing chops 3# x 10 B at [employer] machine      PATIENT EDUCATION:  Education details: PT eval findings, anticipated POC, initial HEP, HEP review, postural awareness, posture and body mechanics for typical daily postioning, mobility and household tasks, role of TPDN, fee schedule for TPDN and lack of insurance coverage requiring payment at time of service, and discussion of limiting twisting exercises and any exercise that causes increased pain at this time, discussion of his seizures   Person educated:  Patient Education method: Explanation, Demonstration, Tactile cues, Verbal cues, and Handouts Education comprehension: verbalized understanding and returned demonstration  HOME EXERCISE PROGRAM: Access Code: [email or web address] URL: [email or web address] Date: 01/14/2024 Prepared by: Drew Smith  Exercises - Supine Bridge  - 2 x daily - 7 x weekly - 1-3 sets - 10 reps - Supine Lower Trunk Rotation  - 2 x daily - 7 x weekly - 1 sets - 5 reps - 10 sec hold - Seated Long Arc Quad  - 1 x daily - 7 x weekly - 2 sets - 10 reps - 5 sec hold - Seated Figure 4 Piriformis Stretch  - 2 x daily - 7 x weekly - 1 sets - 3 reps - 30-60 sec hold - Supine Figure 4 Piriformis Stretch  - 1 x daily - 7 x weekly - 1 sets - 3 reps - 30-60 sec hold - Hooklying Single Knee to Chest Stretch  - 1 x daily - 3 x weekly - 1 sets - 3 reps - 20 sec hold  ASSESSMENT:  CLINICAL IMPRESSION: Akhilesh verbalized feeling 70% better since starting skilled therapy. He has met all STGs at this time and he is progressing well towards LTGs. [employer] hinging today. Patient responded well having a tactile cue of the wall for proper performance. He feels his back is improving but it is still not where he wants it. He want to be ready for golf season in March.  PT provided verbal and visual cues as needed for proper exercise performance.       EVAL: Patient is a 83 y.o. male who was seen today for physical therapy evaluation and treatment for acute LBP starting 2 weeks ago. He reports marked pain with sit to stand transfers, bed mobility, sitting, standing and walking. His pain affects all ADLs including sleep. He also cannot golf which is his main goal. He has pain with lumbar flexion and R SB, pain with resisted hip flexion. He has pain with B SLR. He has a h/o of severe L4/5 facet arthrosis and spinal stenosis. He is very active and would like to return to his PLOF. Pt will benefit from skilled PT to address these deficits and  those listed below. He has a history of seizures and is on med to control these.  He has not had one in over a year, but has a denture which would need to be removed if a seizure occurs.   OBJECTIVE IMPAIRMENTS: Abnormal gait, decreased activity tolerance, decreased mobility, difficulty walking, decreased ROM, decreased strength, dizziness, increased muscle spasms, impaired flexibility, postural dysfunction, and pain.   ACTIVITY LIMITATIONS: carrying, lifting, bending, sitting, standing, squatting, sleeping, stairs, transfers, bed mobility, bathing, dressing, hygiene/grooming, and locomotion level  PARTICIPATION LIMITATIONS: meal prep, cleaning, laundry, driving, shopping, community activity, yard work, and golf  PERSONAL FACTORS: Age and 3+ comorbidities: OA, leg cramps, heart issues are also affecting patient's functional outcome.   REHAB POTENTIAL: Good  CLINICAL DECISION MAKING: Evolving/moderate complexity  EVALUATION COMPLEXITY: Moderate   GOALS: Goals reviewed with patient? Yes  SHORT TERM GOALS: Target date: 02/04/2024   Patient will be independent with initial HEP.  Baseline:  Goal status: MET 02/10/2024  2.  Patient will report centralization of radicular symptoms.  Baseline:  Goal status: MET 02/10/2024  3.  Decreased back pain by 30% to improve QOL. Baseline:  Goal status: MET 02/10/2024  4.  Patient able to sleep without waking from back pain Baseline:  Goal status: MET 02/10/2024    LONG TERM GOALS: Target date: 03/03/2024   Patient will be independent with advanced/ongoing HEP to improve outcomes and carryover.  Baseline:  Goal status: INITIAL  2.  Patient will report 75% improvement in low back pain with ADLs to improve QOL.  Baseline:  Goal status: INITIAL  3.  Patient will demonstrate functional lumbar flexion to perform ADLs and return to golf   Baseline:  Goal status: INITIAL  4.  Patient will be able to perform 5xSTS in less than 20 seconds  showing functional improvement in strength without increased pain Baseline:  Goal status: INITIAL  5.  Patient will 18 or less on the Modified Oswestry demonstrating improved functional ability.  Baseline: 24 Goal status: INITIAL    PLAN:  PT FREQUENCY: 2x/week  PT DURATION: 8 weeks  PLANNED INTERVENTIONS: 97110-Therapeutic exercises, 97530- Therapeutic activity, 97112- Neuromuscular re-education, 97535- Self Care, [id number]- Manual therapy, [phone]- Gait training, [phone]- Aquatic Therapy, [phone]- Traction (mechanical), [phone]- Ionotophoresis 4mg /ml Dexamethasone, [id number] (1-2 muscles), [id number] (3+ muscles)- Dry Needling, Patient/Family education, Balance training, Stair training, Taping, Joint mobilization, Spinal mobilization, Cryotherapy, and Moist heat.  PLAN FOR NEXT SESSION: recert soon;  PRECAUTION: SEIZURES AND HAS DENTURES (NO ESTIM), Review and progress HEP, ROM, strengthening,  manual/DN to lumbar spine, spinal mobs, body mechanics and ADL modifications, traction?    Kristeen Sar, PT, DPT 02/10/2024 3:33 PM

## 2024-02-12 ENCOUNTER — Ambulatory Visit: Admitting: Physical Therapy

## 2024-02-17 ENCOUNTER — Ambulatory Visit: Admitting: Physical Therapy

## 2024-02-24 ENCOUNTER — Ambulatory Visit: Admitting: Physical Therapy

## 2024-03-02 ENCOUNTER — Other Ambulatory Visit: Payer: Self-pay | Admitting: Family Medicine

## 2024-03-04 ENCOUNTER — Other Ambulatory Visit: Payer: Self-pay | Admitting: Cardiology

## 2024-03-04 DIAGNOSIS — I422 Other hypertrophic cardiomyopathy: Secondary | ICD-10-CM

## 2024-03-04 DIAGNOSIS — I10 Essential (primary) hypertension: Secondary | ICD-10-CM

## 2024-03-09 ENCOUNTER — Ambulatory Visit: Admitting: Physical Therapy

## 2024-03-10 ENCOUNTER — Encounter: Payer: Self-pay | Admitting: Physical Therapy

## 2024-03-10 ENCOUNTER — Ambulatory Visit: Attending: Family Medicine | Admitting: Physical Therapy

## 2024-03-10 DIAGNOSIS — R252 Cramp and spasm: Secondary | ICD-10-CM | POA: Insufficient documentation

## 2024-03-10 DIAGNOSIS — M5459 Other low back pain: Secondary | ICD-10-CM | POA: Insufficient documentation

## 2024-03-10 NOTE — Therapy (Signed)
 " OUTPATIENT PHYSICAL THERAPY THORACOLUMBAR TREATMENT   Patient Name: Drew Smith MRN: [id number] DOB:[birth year], 84 y.o., male Today's Date: 03/10/2024  END OF SESSION:  PT End of Session - 03/10/24 1313     Visit Number 7    Date for Recertification  05/05/24    Authorization Type HTA - no auth reqd; med nec    Progress Note Due on Visit 10    PT Start Time 1234    PT Stop Time 1314    PT Time Calculation (min) 40 min    Activity Tolerance Patient tolerated treatment well    Behavior During Therapy WFL for tasks assessed/performed                Past Medical History:  Diagnosis Date   Arthritis    Asthma    Cancer (HCC)    prostate   GERD (gastroesophageal reflux disease)    Guillain-Barre syndrome 02/25/1998   Hyperlipidemia    Hypertension    Seizures (HCC)    Past Surgical History:  Procedure Laterality Date   CATARACT EXTRACTION     EYE SURGERY  02/26/1943   84 years old   HERNIA REPAIR  02/25/1993   NOSE SURGERY  02/26/1988   PROSTATE SURGERY  02/25/1998   seed inplants   SHOULDER SURGERY  02/25/2006   Patient Active Problem List   Diagnosis Date Noted   Hypertrophic cardiomyopathy (HCC) 04/01/2023   Actinic keratosis 01/02/2022   Hypokalemia 05/06/2021   CHB (complete heart block) (HCC) 05/05/2021   Syncope 05/04/2021   Paroxysmal atrial fibrillation (HCC) 05/04/2021   History of seizures 05/04/2021   Malignant melanoma of left forearm (HCC) 10/19/2019   Upper respiratory tract infection 02/14/2017   Porokeratosis 09/07/2014   Metatarsal deformity 09/07/2014   Pain in lower limb 09/07/2014   Squamous cell carcinoma in situ of skin 10/15/2013   Hives 10/06/2012   History of Guillain-Barre syndrome 03/07/2011   Hypertension 11/28/2010   Dyslipidemia 11/28/2010   ERECTILE DYSFUNCTION 01/05/2008   Asthma 10/28/2007   GERD 10/28/2007   DIZZINESS 10/28/2007   COUGH 10/28/2007   FASTING HYPERGLYCEMIA 10/28/2007   PROSTATE CANCER, HX OF  10/28/2007    PCP: Micheal Wolm ORN, MD   REFERRING PROVIDER: Micheal Wolm ORN, MD   REFERRING DIAG: M54.50 (ICD-10-CM) - Lumbar pain   Rationale for Evaluation and Treatment: Rehabilitation  THERAPY DIAG:  Other low back pain  Cramp and spasm  ONSET DATE: 2 weeks ago  SUBJECTIVE:  SUBJECTIVE STATEMENT: Pt has been away from PT for multiple weeks but feels he has maintained gains.  Pain is more isolated on Rt side.  I'm still about 70% better.  Still want to do more PT to help my golf game. I think I can taper to every other week.  EVAL: Plays a lot of golf. I try to do a lot of exercises and stretch. Sees a chiropractor, saw Monday and today and it helped. It was a lot of stretching.  I can't sleep or walk right. I went to [facility] on Monday and they were very aggressive with me. Yesterday I was in a lot of pain. A little better today, but still bad.  PERTINENT HISTORY:   Fatty heart, OA, HTN, seizures (been over a year - takes meds), prostate CA, leg cramps  PAIN:  03/10/2024 Yes, 2-3/10 pain in R hip to greater trochanter, low back  Eval: Are you having pain? Yes: NPRS scale: 2-3/10 Pain location: low back right side shoots down right leg to foot and foot feels numb Pain description: dull sitting, sharp when standing Aggravating factors: sit to stand,  standing and walking Relieving factors: Tylenol  PRECAUTIONS: Other: SEIZURES - NO ESTIM  RED FLAGS: None   WEIGHT BEARING RESTRICTIONS: No  FALLS:  Has patient fallen in last 6 months? No  LIVING ENVIRONMENT: Lives with: lives alone Lives in: House/apartment Stairs: Yes: External: 2 steps; none Has following equipment at home: None  OCCUPATION: retired  PLOF: Independent  PATIENT GOALS: back to golf  NEXT MD VISIT:  one week  OBJECTIVE:  Note: Objective measures were completed at Evaluation unless otherwise noted.  DIAGNOSTIC FINDINGS:  2022  MRI Lumbar 1. Severe L4-5 facet arthrosis with grade 1 anterolisthesis and severe spinal stenosis. 2. Mild spinal stenosis and mild-to-moderate lateral recess stenosis at L3-4.  PATIENT SURVEYS:  Modified Oswestry:  MODIFIED OSWESTRY DISABILITY SCALE  Date: 01/07/24 03/10/24 Score  Pain intensity 1 3 =  Pain medication provides me with moderate relief from pain.  2. Personal care (washing, dressing, etc.) 1 2 =  It is painful to take care of myself, and I am slow and careful.  3. Lifting 2 2 = Pain prevents me from lifting heavy weights off the floor,but I can manage if the weights are conveniently positioned (e.g. on a table)  4. Walking 2 3 =  Pain prevents me from walking more than  mile.  5. Sitting 1 2 =  Pain prevents me from sitting more than 1 hour.  6. Standing 2 3 =  Pain prevents me from standing more than 1/2 hour.  7. Sleeping 1 3 =  Even when I take pain medication, I sleep less than 4 hours.  8. Social Life 2 2 = Pain prevents me from participating in more energetic activities (eg. sports, dancing).  9. Traveling 2 2 =  My pain restricts my travel over 2 hours.  10. Employment/ Homemaking 2 2 = I can perform most of my homemaking/job duties, but pain prevents me from performing more physically stressful activities (eg, lifting, vacuuming).  Total 16/50 24/50   Interpretation of scores: Score Category Description  0-20% Minimal Disability The patient can cope with most living activities. Usually no treatment is indicated apart from advice on lifting, sitting and exercise  21-40% Moderate Disability The patient experiences more pain and difficulty with sitting, lifting and standing. Travel and social life are more difficult and they may be disabled from work. Personal care,  sexual activity and sleeping are not grossly affected, and the patient  can usually be managed by conservative means  41-60% Severe Disability Pain remains the main problem in this group, but activities of daily living are affected. These patients require a detailed investigation  61-80% Crippled Back pain impinges on all aspects of the patients life. Positive intervention is required  81-100% Bed-bound These patients are either bed-bound or exaggerating their symptoms  Bluford FORBES Zoe DELENA Karon DELENA, et al. Surgery versus conservative management of stable thoracolumbar fracture: the PRESTO feasibility RCT. [place] ([place]): [employer]; 2021 Nov. [facility] Technology Assessment, No. 25.62.) Appendix 3, Oswestry Disability Index category descriptors. Available from: Findjewelers.cz  Minimally Clinically Important Difference (MCID) = 12.8%  COGNITION: Overall cognitive status: Within functional limits for tasks assessed     SENSATION: Tingling in both feet x 1-2 yrs  MUSCLE LENGTH:   POSTURE: rounded shoulders, forward head, decreased lumbar lordosis, increased thoracic kyphosis, posterior pelvic tilt, and flexed trunk   PALPATION: R lower lumbar and near PSIS  LUMBAR ROM: * pain  AROM eval 03/10/24  Flexion 25%* 50%  Extension 25% 30%  Right lateral flexion WFL* WFL  Left lateral flexion [facility] WFL  Right rotation 50% 50%  Left rotation 50% 50%   (Blank rows = not tested)  LOWER EXTREMITY ROM:   WFL for tasks assessed  LOWER EXTREMITY MMT:   03/10/24: 4+/5 throughout bil LE   *pain with resisted R hip flexion Grossly 4+/5 to 5/5 in B hips, knees and ankle DF   LUMBAR SPECIAL TESTS:  Straight leg raise test: Positive and Quadrant test: Positive  FUNCTIONAL TESTS:  5 times sit to stand: 32.86 sec 3-5/10 pain  02/10/2024 5STS 15.62 sec 1/10 pain  03/10/24 5x STS 18.89 - Pt stated feet were a little numb  GAIT: Distance walked: 20 Comments: antalgic, decreased stance time R  TREATMENT DATE:                                                                                                                                03/10/24: NuStep L4 x 9' PT present to discuss status and do ODI survey 5x STS: 18.89 Trunk ROM assessment Standing T at counter with single UE support Lateral lunge at counter alt directions x10 Mini reverse lunge at counter x8 each LE Seated 5lb hip to hip x20 and hip to shoulder with trunk rotation x10 RDL 4lb modified depth (to lat pulldown seat) 2x10 Standing single arm row with ipsilateral rotation, alt Lt/Rt 2x10 - cue to have eyes follow hand Blue tband pallof press with rotation x10 each - cue to have eyes follow hands    02/10/24 Nustep L4 x 7 min 5STS & goal assessment Hinge to the wall x 10 RDL with 4# DB 2 x 10 4# DB shoulder height + march x 20 Seated hip to hip 4# DB 2 x 10 Seated piriforms stretch 2 x 20  sec bilateral  Green TB under Lt foot + rotation to right 2 x 10 Discussion of golf postures and rotations he performs Mini reverse lunge + march x 12 bilateral  Lateral lunge x 10 bilateral  Standing trunk rotation with green TB x 15 each direction   02/03/24 Nustep L4 x 7 min Forward T hand supported on counter x 5 Sit to stand holding 6# DB x 10 6# DB shoulder height + march x 10 each bilateral  Seated hinge with 6# DB x 10 Seated ear to hip with 6# BD x 10 each side Pallof press green with TB x 20 each direction Standing trunk rotation with green TB x 15 each direction     01/29/24 Nustep L4 x 6 min Seated hip IR/ER x 10 each direction Seated LAQ  with neck flexion for nerve glide x 10 Rt Forward T x 8 each side Side stepping with red loop x 4 laps at counter Weighted golf swing 3# DB x 10 Pallof press green with TB x 12 each direction Standing trunk rotation with green TB x 12 each direction Standing chop with green TB x 12 each direction Education on patient's sleep position & use of pillows in between knees     PATIENT EDUCATION:   Education details: PT eval findings, anticipated POC, initial HEP, HEP review, postural awareness, posture and body mechanics for typical daily postioning, mobility and household tasks, role of TPDN, fee schedule for TPDN and lack of insurance coverage requiring payment at time of service, and discussion of limiting twisting exercises and any exercise that causes increased pain at this time, discussion of his seizures   Person educated: Patient Education method: Explanation, Demonstration, Tactile cues, Verbal cues, and Handouts Education comprehension: verbalized understanding and returned demonstration  HOME EXERCISE PROGRAM: Access Code: [email or web address] URL: [email or web address] Date: 01/14/2024 Prepared by: Mliss  Exercises - Supine Bridge  - 2 x daily - 7 x weekly - 1-3 sets - 10 reps - Supine Lower Trunk Rotation  - 2 x daily - 7 x weekly - 1 sets - 5 reps - 10 sec hold - Seated Long Arc Quad  - 1 x daily - 7 x weekly - 2 sets - 10 reps - 5 sec hold - Seated Figure 4 Piriformis Stretch  - 2 x daily - 7 x weekly - 1 sets - 3 reps - 30-60 sec hold - Supine Figure 4 Piriformis Stretch  - 1 x daily - 7 x weekly - 1 sets - 3 reps - 30-60 sec hold - Hooklying Single Knee to Chest Stretch  - 1 x daily - 3 x weekly - 1 sets - 3 reps - 20 sec hold  ASSESSMENT:  CLINICAL IMPRESSION: Ayaansh has been away from PT for approx 1 mo and verbalized maintaining gains (reports 70% better since starting skilled therapy) through HEP compliance. He has met all STGs and his LTG for 5x STS to date. He  is progressing well towards LTGs. He demos improved ROM with less pain.  He needs cues for more upright posture at trunk and hips for improved posterior chain use for closed chain mobility. He does well with incorporation of hinging but needs those cues to not drift into flexion between reps. PT incorportated more rotation today for his golf game.  Pt expressed he felt he could taper to every other  week since he does his HEP daily but hopes for continued guided progression along POC to work on between visits.  EVAL: Patient is a 84 y.o. male who was seen today for physical therapy evaluation and treatment for acute LBP starting 2 weeks ago. He reports marked pain with sit to stand transfers, bed mobility, sitting, standing and walking. His pain affects all ADLs including sleep. He also cannot golf which is his main goal. He has pain with lumbar flexion and R SB, pain with resisted hip flexion. He has pain with B SLR. He has a h/o of severe L4/5 facet arthrosis and spinal stenosis. He is very active and would like to return to his PLOF. Pt will benefit from skilled PT to address these deficits and those listed below. He has a history of seizures and is on med to control these. He has not had one in over a year, but has a denture which would need to be removed if a seizure occurs.   OBJECTIVE IMPAIRMENTS: Abnormal gait, decreased activity tolerance, decreased mobility, difficulty walking, decreased ROM, decreased strength, dizziness, increased muscle spasms, impaired flexibility, postural dysfunction, and pain.   ACTIVITY LIMITATIONS: carrying, lifting, bending, sitting, standing, squatting, sleeping, stairs, transfers, bed mobility, bathing, dressing, hygiene/grooming, and locomotion level  PARTICIPATION LIMITATIONS: meal prep, cleaning, laundry, driving, shopping, community activity, yard work, and golf  PERSONAL FACTORS: Age and 3+ comorbidities: OA, leg cramps, heart issues are also affecting patient's functional outcome.   REHAB POTENTIAL: Good  CLINICAL DECISION MAKING: Evolving/moderate complexity  EVALUATION COMPLEXITY: Moderate   GOALS: Goals reviewed with patient? Yes  SHORT TERM GOALS: Target date: 02/04/2024   Patient will be independent with initial HEP.  Baseline:  Goal status: MET 02/10/2024  2.  Patient will report centralization of radicular symptoms.   Baseline:  Goal status: MET 02/10/2024  3.  Decreased back pain by 30% to improve QOL. Baseline:  Goal status: MET 02/10/2024  4.  Patient able to sleep without waking from back pain Baseline:  Goal status: MET 02/10/2024    LONG TERM GOALS: Target date: 05/05/24  Patient will be independent with advanced/ongoing HEP to improve outcomes and carryover.  Baseline:  Goal status: ongoing 03/10/24  2.  Patient will report 75% improvement in low back pain with ADLs to improve QOL.  Baseline:  Goal status:ongoing 70% improved 03/10/24  3.  Patient will demonstrate functional lumbar flexion to perform ADLs and return to golf   Baseline:  Goal status: ongoing 03/10/24  4.  Patient will be able to perform 5xSTS in less than 20 seconds showing functional improvement in strength without increased pain Baseline:  Goal status: met 03/10/24  5.  Patient will 18 or less on the Modified Oswestry demonstrating improved functional ability.  Baseline: 24 Goal status: ongoing 03/10/24    PLAN:  PT FREQUENCY: every other week x 8 weeks  PT DURATION: 8 weeks  PLANNED INTERVENTIONS: 97110-Therapeutic exercises, 97530- Therapeutic activity, 97112- Neuromuscular re-education, 97535- Self Care, [id number]- Manual therapy, [phone]- Gait training, [phone]- Aquatic Therapy, [phone]- Traction (mechanical), [phone]- Ionotophoresis 4mg /ml Dexamethasone, 20560 (1-2 muscles), 20561 (3+ muscles)- Dry Needling, Patient/Family education, Balance training, Stair training, Taping, Joint mobilization, Spinal mobilization, Cryotherapy, and Moist heat.  PLAN FOR NEXT SESSION: PRECAUTION: SEIZURES AND HAS DENTURES (NO ESTIM), Review and progress HEP, ROM, strengthening,  manual/DN to lumbar spine, spinal mobs, body mechanics and ADL modifications, traction?    Almee Pelphrey, PT 03/10/2024 1:19 PM      "

## 2024-03-11 LAB — BASIC METABOLIC PANEL WITH GFR
BUN/Creatinine Ratio: 23 (ref 10–24)
BUN: 24 mg/dL (ref 8–27)
CO2: 24 mmol/L (ref 20–29)
Calcium: 9.3 mg/dL (ref 8.6–10.2)
Chloride: 101 mmol/L (ref 96–106)
Creatinine, Ser: 1.03 mg/dL (ref 0.76–1.27)
Glucose: 123 mg/dL — ABNORMAL HIGH (ref 70–99)
Potassium: 4.8 mmol/L (ref 3.5–5.2)
Sodium: 138 mmol/L (ref 134–144)
eGFR: 72 mL/min/1.73

## 2024-03-12 LAB — HEMOGLOBIN AND HEMATOCRIT, BLOOD
Hematocrit: 39.9 % (ref 37.5–51.0)
Hemoglobin: 13.7 g/dL (ref 13.0–17.7)

## 2024-03-15 ENCOUNTER — Ambulatory Visit: Payer: Self-pay | Admitting: Cardiology

## 2024-03-16 ENCOUNTER — Other Ambulatory Visit: Payer: Self-pay

## 2024-03-16 DIAGNOSIS — Z7901 Long term (current) use of anticoagulants: Secondary | ICD-10-CM

## 2024-03-16 MED ORDER — RIVAROXABAN 20 MG PO TABS: ORAL_TABLET | ORAL | 1 refills | Status: AC

## 2024-03-23 ENCOUNTER — Ambulatory Visit

## 2024-04-06 ENCOUNTER — Ambulatory Visit

## 2024-04-20 ENCOUNTER — Ambulatory Visit: Admitting: Physical Therapy

## 2024-05-04 ENCOUNTER — Ambulatory Visit

## 2024-07-14 ENCOUNTER — Ambulatory Visit

## 2024-07-29 ENCOUNTER — Ambulatory Visit: Admitting: Neurology

## 2024-08-03 ENCOUNTER — Ambulatory Visit: Admitting: Neurology
